# Patient Record
Sex: Female | Born: 1955
Health system: Southern US, Community
[De-identification: ages and names within clinical notes are randomized; demographics above are authoritative.]

## PROBLEM LIST (undated history)

## (undated) DIAGNOSIS — Z0000 Encounter for general adult medical examination without abnormal findings: Secondary | ICD-10-CM

## (undated) DIAGNOSIS — Z13220 Encounter for screening for lipoid disorders: Secondary | ICD-10-CM

## (undated) DIAGNOSIS — Z87891 Personal history of nicotine dependence: Secondary | ICD-10-CM

## (undated) HISTORY — DX: Encounter for general adult medical examination without abnormal findings: Z00.00

## (undated) HISTORY — DX: Personal history of nicotine dependence: Z87.891

## (undated) HISTORY — DX: Encounter for screening for lipoid disorders: Z13.220

## (undated) HISTORY — PX: OTHER SURGICAL HISTORY: SHX169

---

## 1997-06-09 ENCOUNTER — Other Ambulatory Visit: Admission: RE | Admit: 1997-06-09 | Discharge: 1997-06-09 | Payer: Self-pay | Admitting: Obstetrics and Gynecology

## 1999-02-18 ENCOUNTER — Other Ambulatory Visit: Admission: RE | Admit: 1999-02-18 | Discharge: 1999-02-18 | Payer: Self-pay | Admitting: Obstetrics and Gynecology

## 2000-04-10 ENCOUNTER — Other Ambulatory Visit: Admission: RE | Admit: 2000-04-10 | Discharge: 2000-04-10 | Payer: Self-pay | Admitting: Obstetrics and Gynecology

## 2001-11-05 ENCOUNTER — Other Ambulatory Visit: Admission: RE | Admit: 2001-11-05 | Discharge: 2001-11-05 | Payer: Self-pay | Admitting: Obstetrics and Gynecology

## 2001-12-11 ENCOUNTER — Encounter: Admission: RE | Admit: 2001-12-11 | Discharge: 2001-12-11 | Payer: Self-pay | Admitting: Family Medicine

## 2001-12-11 ENCOUNTER — Encounter: Payer: Self-pay | Admitting: Family Medicine

## 2002-01-14 ENCOUNTER — Encounter: Payer: Self-pay | Admitting: Obstetrics and Gynecology

## 2002-01-14 ENCOUNTER — Encounter: Admission: RE | Admit: 2002-01-14 | Discharge: 2002-01-14 | Payer: Self-pay | Admitting: Obstetrics and Gynecology

## 2004-06-25 ENCOUNTER — Encounter: Admission: RE | Admit: 2004-06-25 | Discharge: 2004-06-25 | Payer: Self-pay | Admitting: Obstetrics and Gynecology

## 2004-08-16 ENCOUNTER — Encounter: Admission: RE | Admit: 2004-08-16 | Discharge: 2004-08-16 | Payer: Self-pay | Admitting: Family Medicine

## 2004-10-06 ENCOUNTER — Ambulatory Visit: Payer: Self-pay

## 2008-01-09 ENCOUNTER — Encounter: Admission: RE | Admit: 2008-01-09 | Discharge: 2008-01-09 | Payer: Self-pay | Admitting: Obstetrics and Gynecology

## 2010-01-05 ENCOUNTER — Encounter: Admission: RE | Admit: 2010-01-05 | Discharge: 2010-01-05 | Payer: Self-pay | Admitting: Family Medicine

## 2010-02-27 ENCOUNTER — Encounter: Payer: Self-pay | Admitting: Obstetrics and Gynecology

## 2011-05-11 ENCOUNTER — Other Ambulatory Visit: Payer: Self-pay | Admitting: Obstetrics & Gynecology

## 2011-05-11 DIAGNOSIS — Z1231 Encounter for screening mammogram for malignant neoplasm of breast: Secondary | ICD-10-CM

## 2011-05-18 ENCOUNTER — Ambulatory Visit
Admission: RE | Admit: 2011-05-18 | Discharge: 2011-05-18 | Disposition: A | Discharge status: Home / Self Care | Payer: BC Managed Care – PPO | Source: Ambulatory Visit | Attending: Obstetrics & Gynecology | Admitting: Obstetrics & Gynecology

## 2011-05-18 DIAGNOSIS — Z1231 Encounter for screening mammogram for malignant neoplasm of breast: Secondary | ICD-10-CM

## 2012-11-21 ENCOUNTER — Other Ambulatory Visit: Payer: Self-pay

## 2012-11-21 DIAGNOSIS — Z1231 Encounter for screening mammogram for malignant neoplasm of breast: Secondary | ICD-10-CM

## 2012-11-23 ENCOUNTER — Ambulatory Visit: Payer: BC Managed Care – PPO

## 2013-01-08 ENCOUNTER — Ambulatory Visit
Admission: RE | Admit: 2013-01-08 | Discharge: 2013-01-08 | Disposition: A | Discharge status: Home / Self Care | Payer: BC Managed Care – PPO | Source: Ambulatory Visit

## 2013-01-08 DIAGNOSIS — Z1231 Encounter for screening mammogram for malignant neoplasm of breast: Secondary | ICD-10-CM

## 2013-06-28 ENCOUNTER — Other Ambulatory Visit: Payer: Self-pay | Admitting: Family Medicine

## 2013-06-28 DIAGNOSIS — Z87891 Personal history of nicotine dependence: Secondary | ICD-10-CM

## 2013-07-04 ENCOUNTER — Other Ambulatory Visit: Payer: BC Managed Care – PPO

## 2013-07-18 ENCOUNTER — Other Ambulatory Visit: Payer: BC Managed Care – PPO

## 2013-08-20 ENCOUNTER — Inpatient Hospital Stay: Admission: RE | Admit: 2013-08-20 | Payer: BC Managed Care – PPO | Source: Ambulatory Visit

## 2014-01-16 DIAGNOSIS — S73199A Other sprain of unspecified hip, initial encounter: Secondary | ICD-10-CM | POA: Insufficient documentation

## 2014-01-16 DIAGNOSIS — M169 Osteoarthritis of hip, unspecified: Secondary | ICD-10-CM | POA: Insufficient documentation

## 2014-11-25 ENCOUNTER — Other Ambulatory Visit (INDEPENDENT_AMBULATORY_CARE_PROVIDER_SITE_OTHER): Payer: BLUE CROSS/BLUE SHIELD

## 2014-11-25 ENCOUNTER — Encounter: Payer: Self-pay | Admitting: Family Medicine

## 2014-11-25 ENCOUNTER — Ambulatory Visit (INDEPENDENT_AMBULATORY_CARE_PROVIDER_SITE_OTHER): Payer: BLUE CROSS/BLUE SHIELD | Admitting: Family Medicine

## 2014-11-25 VITALS — BP 120/82 | HR 72 | Ht 67.0 in | Wt 179.0 lb

## 2014-11-25 DIAGNOSIS — M1612 Unilateral primary osteoarthritis, left hip: Secondary | ICD-10-CM | POA: Diagnosis not present

## 2014-11-25 DIAGNOSIS — M76891 Other specified enthesopathies of right lower limb, excluding foot: Secondary | ICD-10-CM | POA: Insufficient documentation

## 2014-11-25 DIAGNOSIS — M79605 Pain in left leg: Secondary | ICD-10-CM

## 2014-11-25 DIAGNOSIS — M7602 Gluteal tendinitis, left hip: Secondary | ICD-10-CM | POA: Diagnosis not present

## 2014-11-25 DIAGNOSIS — M76899 Other specified enthesopathies of unspecified lower limb, excluding foot: Secondary | ICD-10-CM | POA: Insufficient documentation

## 2014-11-25 DIAGNOSIS — M7062 Trochanteric bursitis, left hip: Secondary | ICD-10-CM | POA: Insufficient documentation

## 2014-11-25 NOTE — Assessment & Plan Note (Signed)
I do believe the patient's groin pain is likely secondary to more of the mild osteophytic changes. Patient does have some limitation with internal range of motion. We discussed the possibility of possibly imaging the patient declined at this time. Most of her pain seemed to be in the lateral aspect and she did respond very well to a gluteal tendon sheath injection and a greater trochanteric bursitis injection. Over this will be beneficial. Patient continues to have pain advance imaging to rule out labral tear may also be necessary. Patient may also respond well to a intra-articular hip injection. We'll discuss this at follow-up.

## 2014-11-25 NOTE — Patient Instructions (Signed)
Good to meet you Ice 20 minutes 2 times daily. Usually after activity and before bed. Exercises 3 times a week.  pennsaid pinkie amount topically 2 times daily as needed.  Turmeric 500mg  twice daily Tart cherry extract at night Vitamin D 2000 IU daily Wear good shoes  Ok to workout but swim (free style) elliptical or bike with avoiding running jumping and deep squats.  Compression shorts can help See me again before you leave!

## 2014-11-25 NOTE — Assessment & Plan Note (Signed)
Patient given injection today and tolerated the procedure very well. We discussed icing regimen and home exercises. We discussed which activities to do an which was potentially avoid. Patient come back and see me again in 3 weeks. Continued have pain on the lateral aspect we may need to consider lumbar radiculopathy but I think this is a low likelihood.

## 2014-11-25 NOTE — Progress Notes (Signed)
Samantha Cox D.O. [facility] [address] [place], [place] [address] Phone: [phone] Subjective:    I'm seeing this patient by the request  of:  No primary care provider on file.   CC: chronic hip and back pain.  VHQ:IONGEXBMWUHPI:Subjective Samantha Cox is a 59 y.o. female coming in with complaint of chronic hip and back pain. Patient has had this for quite some time. Back in December 2015 patient was seen by orthopedics within [facility]. Patient was diagnosed with more of a piriformis syndrome. Patient was seen by physical therapy. Patient states pain seems to be more on the lateral aspect of the hip. Patient does not remember any true injury. Seems to radiate posteriorly as well as somewhat down the leg. Patient does train horses on a regular basis and thinks that this can exacerbate the problem. Patient though has had a recent injury when she was swimming and now is having groin pain. Patient denies any radiation down her leg. Patient states though that this is starting to affect even her daily activities. Patient does have a very large competition in 3 weeks and at this moment she cannot ride horses secondary to the discomfort.Rates the severity of pain as 9 out of 10 sometimes.  Past Medical History  Diagnosis Date  . Routine general medical examination at a health care facility   . History of tobacco use   . Screening for lipoid disorders    Past Surgical History  Procedure Laterality Date  . Other surgical history      Birthmark removed from forehead 7th grade   Social History  Substance Use Topics  . Smoking status: Former Games developermoker  . Smokeless tobacco: None  . Alcohol Use: None   Allergies  Allergen Reactions  . Augmentin [Amoxicillin-Pot Clavulanate] Diarrhea    Felt very bad Felt very bad    Family History  Problem Relation Age of Onset  . Hypertension Mother   . Atrial fibrillation Father   . CVA Father [id number]  . Bladder Cancer Father   . Lung  cancer Brother   . Diverticulitis Brother         Past medical history, social, surgical and family history all reviewed in electronic medical record.   Review of Systems: No headache, visual changes, nausea, vomiting, diarrhea, constipation, dizziness, abdominal pain, skin rash, fevers, chills, night sweats, weight loss, swollen lymph nodes, body aches, joint swelling, muscle aches, chest pain, shortness of breath, mood changes.   Objective Blood pressure 120/82, pulse 72, height 5\' 7"  (1.702 m), weight 179 lb (81.194 kg), SpO2 95 %.  General: No apparent distress alert and oriented x3 mood and affect normal, dressed appropriately.  HEENT: Pupils equal, extraocular movements intact  Respiratory: Patient's speak in full sentences and does not appear short of breath  Cardiovascular: No lower extremity edema, non tender, no erythema  Skin: Warm dry intact with no signs of infection or rash on extremities or on axial skeleton.  Abdomen: Soft nontender  Neuro: Cranial nerves II through XII are intact, neurovascularly intact in all extremities with 2+ DTRs and 2+ pulses.  Lymph: No lymphadenopathy of posterior or anterior cervical chain or axillae bilaterally.  Gait normal with good balance and coordination.  MSK:  Non tender with full range of motion and good stability and symmetric strength and tone of shoulders, elbows, wrist,  knee and ankles bilaterally.  Back Exam:  Inspection: Unremarkable  Motion: Flexion 45 deg, Extension 25 deg, Side Bending  to 45 deg bilaterally,  Rotation to 45 deg bilaterally  SLR laying: Negative  XSLR laying: Negative  Palpable tenderness: None. FABER: mild positive Sensory change: Gross sensation intact to all lumbar and sacral dermatomes.  Reflexes: 2+ at both patellar tendons, 2+ at achilles tendons, Babinski's downgoing.  Strength at foot  Plantar-flexion: 5/5 Dorsi-flexion: 5/5 Eversion: 5/5 Inversion: 5/5  Leg strength  Quad: 5/5 Hamstring: 5/5 Hip  flexor: 5/5 Hip abductors: 5/5  Gait unremarkable.   ZOX:WRUE ROM IR: 15 Degwith pain in groin area, ER: 45 Deg, Flexion: 120 Deg, Extension: 100 Deg, Abduction: 45 Deg, Adduction: 45 Deg Strength IR: 5/5, ER: 5/5, Flexion: 5/5, Extension: 5/5, Abduction: 4/5, Adduction: 5/5 Pelvic alignment unremarkable to inspection and palpation. Standing hip rotation and gait without trendelenburg sign / unsteadiness. Greater trochanter without tenderness to palpation. No tenderness over piriformis and greater trochanter. No pain with FABER or FADIR. No SI joint tenderness and normal minimal SI movement.  MSK US performed of: Right This study was ordered, performed, and interpreted by Terrilee Files D.O.  Hip: Trochanteric bursa with significant hypoechoic changes and swelling she also has some effusion noted of the gluteal tendon. Acetabular labrum visualized and without tears, displacement, or effusion in joint. Femoral neck appears unremarkable without increased power doppler signal along Cortex.  IMPRESSION:  Greater trochanter bursitis moderate gluteal tendinitis   Procedure: Real-time Ultrasound Guided Injection of leftgreater trochanteric bursitis secondary to patient's body habitus Device: GE Logiq E  Ultrasound guided injection is preferred based studies that show increased duration, increased effect, greater accuracy, decreased procedural pain, increased response rate, and decreased cost with ultrasound guided versus blind injection.  Verbal informed consent obtained.  Time-out conducted.  Noted no overlying erythema, induration, or other signs of local infection.  Skin prepped in a sterile fashion.  Local anesthesia: Topical Ethyl chloride.  With sterile technique and under real time ultrasound guidance:  Greater trochanteric area was visualized and patient's bursa was noted. A 22-gauge 3 inch needle was inserted and 4 cc of 0.5% Marcaine and 1 cc of Kenalog 40 mg/dL was injected.  Pictures taken Completed without difficulty  Pain immediately resolved suggesting accurate placement of the medication.  Advised to call if fevers/chills, erythema, induration, drainage, or persistent bleeding.  Images permanently stored and available for review in the ultrasound unit.  Impression: Technically successful ultrasound guided injection.   Procedure note 97110; 15 minutes spent for Therapeutic exercises as stated in above notes.  This included exercises focusing on stretching, strengthening, with significant focus on eccentric aspects.Hip strengthening exercises which included:  Pelvic tilt/bracing to help with proper recruitment of the lower abs and pelvic floor muscles  Glute strengthening to properly contract glutes without over-engaging low back and hamstrings - prone hip extension and glute bridge exercises Proper stretching techniques to increase effectiveness for the hip flexors, groin, quads, piriformis and low back when appropriate    Proper technique shown and discussed handout in great detail with ATC.  All questions were discussed and answered.     Impression and Recommendations:     This case required medical decision making of moderate complexity.

## 2014-11-25 NOTE — Progress Notes (Signed)
Pre visit review using our clinic review tool, if applicable. No additional management support is needed unless otherwise documented below in the visit note. 

## 2014-12-04 ENCOUNTER — Ambulatory Visit (INDEPENDENT_AMBULATORY_CARE_PROVIDER_SITE_OTHER)
Admission: RE | Admit: 2014-12-04 | Discharge: 2014-12-04 | Disposition: A | Discharge status: Home / Self Care | Payer: BLUE CROSS/BLUE SHIELD | Source: Ambulatory Visit | Attending: Family Medicine | Admitting: Family Medicine

## 2014-12-04 ENCOUNTER — Encounter: Payer: Self-pay | Admitting: Family Medicine

## 2014-12-04 ENCOUNTER — Ambulatory Visit (INDEPENDENT_AMBULATORY_CARE_PROVIDER_SITE_OTHER): Payer: BLUE CROSS/BLUE SHIELD | Admitting: Family Medicine

## 2014-12-04 ENCOUNTER — Other Ambulatory Visit (INDEPENDENT_AMBULATORY_CARE_PROVIDER_SITE_OTHER): Payer: BLUE CROSS/BLUE SHIELD

## 2014-12-04 VITALS — BP 130/70 | HR 97 | Ht 67.0 in | Wt 176.0 lb

## 2014-12-04 DIAGNOSIS — M25552 Pain in left hip: Secondary | ICD-10-CM | POA: Diagnosis not present

## 2014-12-04 DIAGNOSIS — S73192A Other sprain of left hip, initial encounter: Secondary | ICD-10-CM | POA: Diagnosis not present

## 2014-12-04 NOTE — Assessment & Plan Note (Signed)
Patient was given an injection today. I do feel that patient likely has a labral tear. There is a possibility for more of a degenerative hip arthritis as well. . We will attempt to try to have her get the x-ray though. This could be more information. Patient did respond fairly well to the injection. We will continue to do so. Has not patient would need to advance imaging including an MRI. Patient does give a history of having a labral tear greater than 2 years ago. Patient come back and see me again in 4-6 weeks for further evaluation and treatment while she continues to conservative therapy.

## 2014-12-04 NOTE — Progress Notes (Signed)
[name] D.O. [facility] [address] [place], [place] [address] Phone: [phone] Subjective:    I'm seeing this patient by the request  of:  No primary care provider on file.   CC: chronic hip and back pain.  BJY:NWGNFAOZHYHPI:Subjective Samantha Cox is a 59 y.o. female coming in with complaint of chronic hip and back pain. Patient was seen previously and was diagnosed with more of a greater trochanteric bursitis as well as a gluteal tendinitis. Patient was given an injection in both of these. Patient was to do home exercises, icing protocol, and we discussed proper shoewear been given topical anti-inflammatories. Patient states the posterior aspect of the pain as well as the lateral aspect the pain is significantly improved. Continuing to have pain more in the groin area. [place] 3 [place] and would like to be near pain-free.  Past Medical History  Diagnosis Date  . Routine general medical examination at a health care facility   . History of tobacco use   . Screening for lipoid disorders    Past Surgical History  Procedure Laterality Date  . Other surgical history      Birthmark removed from forehead 7th grade   Social History  Substance Use Topics  . Smoking status: Former Games developermoker  . Smokeless tobacco: None  . Alcohol Use: None   Allergies  Allergen Reactions  . Augmentin [Amoxicillin-Pot Clavulanate] Diarrhea    Felt very bad Felt very bad    Family History  Problem Relation Age of Onset  . Hypertension Mother   . Atrial fibrillation Father   . CVA Father [phone]  . Bladder Cancer Father   . Lung cancer Brother   . Diverticulitis Brother         Past medical history, social, surgical and family history all reviewed in electronic medical record.   Review of Systems: No headache, visual changes, nausea, vomiting, diarrhea, constipation, dizziness, abdominal pain, skin rash, fevers, chills, night sweats, weight  loss, swollen lymph nodes, body aches, joint swelling, muscle aches, chest pain, shortness of breath, mood changes.   Objective Blood pressure 130/70, pulse 97, height 5\' 7"  (1.702 m), weight 176 lb (79.833 kg), SpO2 94 %.  General: No apparent distress alert and oriented x3 mood and affect normal, dressed appropriately.  HEENT: Pupils equal, extraocular movements intact  Respiratory: Patient's speak in full sentences and does not appear short of breath  Cardiovascular: No lower extremity edema, non tender, no erythema  Skin: Warm dry intact with no signs of infection or rash on extremities or on axial skeleton.  Abdomen: Soft nontender  Neuro: Cranial nerves II through XII are intact, neurovascularly intact in all extremities with 2+ DTRs and 2+ pulses.  Lymph: No lymphadenopathy of posterior or anterior cervical chain or axillae bilaterally.  Gait normal with good balance and coordination.  MSK:  Non tender with full range of motion and good stability and symmetric strength and tone of shoulders, elbows, wrist,  knee and ankles bilaterally.  Back Exam:  Inspection: Unremarkable  Motion: Flexion 45 deg, Extension 25 deg, Side Bending to 45 deg bilaterally,  Rotation to 45 deg bilaterally  SLR laying: Negative  XSLR laying: Negative  Palpable tenderness: None. FABER: mild positive bilaterally but improved from previous exam Sensory change: Gross sensation intact to all lumbar and sacral dermatomes.  Reflexes: 2+ at both patellar tendons, 2+ at achilles tendons, Babinski's downgoing.  Strength at foot  Plantar-flexion: 5/5  Dorsi-flexion: 5/5 Eversion: 5/5 Inversion: 5/5  Leg strength  Quad: 5/5 Hamstring: 5/5 Hip flexor: 5/5 Hip abductors: 5/5  Gait unremarkable.   ZOX:WRUE ROM IR: 15 Degwith pain in groin area, ER: 45 Deg, Flexion: 120 Deg, Extension: 100 Deg, Abduction: 45 Deg, Adduction: 45 Deg Strength IR: 5/5, ER: 5/5, Flexion: 5/5, Extension: 5/5, Abduction: 4/5, Adduction:  5/5 Pelvic alignment unremarkable to inspection and palpation. Standing hip rotation and gait without trendelenburg sign / unsteadiness. Greater trochanter without tenderness to palpation. Minimal tenderness over the medial tendon Increasing pain with internal rotation No SI joint tenderness and normal minimal SI movement.  Procedure: Real-time Ultrasound Guided Injection of right hip Device: GE Logiq E  Ultrasound guided injection is preferred based studies that show increased duration, increased effect, greater accuracy, decreased procedural pain, increased response rate with ultrasound guided versus blind injection.  Verbal informed consent obtained.  Time-out conducted.  Noted no overlying erythema, induration, or other signs of local infection.  Skin prepped in a sterile fashion.  Local anesthesia: Topical Ethyl chloride.  With sterile technique and under real time ultrasound guidance:  Anterior capsule visualized, needle visualized going to the head neck junction at the anterior capsule. Pictures taken. Patient did have injection of 3 cc of 1% lidocaine, 3 cc of 0.5% Marcaine, and 1 cc of Kenalog 40 mg/dL. Completed without difficulty  Pain immediately resolved suggesting accurate placement of the medication.  Advised to call if fevers/chills, erythema, induration, drainage, or persistent bleeding.  Images permanently stored and available for review in the ultrasound unit.  Impression: Technically successful ultrasound guided injection.      Impression and Recommendations:     This case required medical decision making of moderate complexity.

## 2014-12-04 NOTE — Patient Instructions (Signed)
Good to see you I will get an xray today Continue all that you are doing.  Tylenol 650 mg 3 times daily Advil as you needed Continue the vitamins See me again in 4-6 weeks.

## 2014-12-04 NOTE — Progress Notes (Signed)
Pre visit review using our clinic review tool, if applicable. No additional management support is needed unless otherwise documented below in the visit note. 

## 2015-01-19 ENCOUNTER — Ambulatory Visit: Payer: BLUE CROSS/BLUE SHIELD | Admitting: Family Medicine

## 2015-01-20 ENCOUNTER — Ambulatory Visit (INDEPENDENT_AMBULATORY_CARE_PROVIDER_SITE_OTHER): Payer: BLUE CROSS/BLUE SHIELD | Admitting: Family Medicine

## 2015-01-20 ENCOUNTER — Encounter: Payer: Self-pay | Admitting: Family Medicine

## 2015-01-20 VITALS — BP 124/84 | HR 80 | Ht 67.0 in | Wt 175.0 lb

## 2015-01-20 DIAGNOSIS — S73192D Other sprain of left hip, subsequent encounter: Secondary | ICD-10-CM

## 2015-01-20 DIAGNOSIS — M1612 Unilateral primary osteoarthritis, left hip: Secondary | ICD-10-CM

## 2015-01-20 NOTE — Progress Notes (Signed)
Pre visit review using our clinic review tool, if applicable. No additional management support is needed unless otherwise documented below in the visit note. 

## 2015-01-20 NOTE — Progress Notes (Signed)
[name] D.O. [facility] [address] [place], [place] [address] Phone: [phone] Subjective:    I'm seeing this patient by the request  of:  No primary care provider on file.   CC: chronic hip and back pain.  NFA:OZHYQMVHQIHPI:Subjective Samantha Cox is a 59 y.o. female coming in with complaint of chronic hip and back pain. Patient was seen previously and was diagnosed with more of a greater trochanteric bursitis as well as a gluteal tendinitis. Patient was given an injection in both of these. Patient was to do home exercises, icing protocol, and we discussed proper shoewear been given topical anti-inflammatories. Patient then had signs and symptoms are consistent with more of a labral tear of the hip that was seen on ultrasound. There is also question of some oral acetabular impingement syndrome. Patient elected to try an injection. States that this groin pain is 89% better. Patient states only some mild discomfort but nothing that is stopping her from activities. Patient states that this of her hip seems to be doing relatively well as well. Patient denies any giving out on her. Very happy with the results at this time.  Past Medical History  Diagnosis Date  . Routine general medical examination at a health care facility   . History of tobacco use   . Screening for lipoid disorders    Past Surgical History  Procedure Laterality Date  . Other surgical history      Birthmark removed from forehead 7th grade   Social History  Substance Use Topics  . Smoking status: Former Games developermoker  . Smokeless tobacco: None  . Alcohol Use: None   Allergies  Allergen Reactions  . Augmentin [Amoxicillin-Pot Clavulanate] Diarrhea    Felt very bad Felt very bad    Family History  Problem Relation Age of Onset  . Hypertension Mother   . Atrial fibrillation Father   . CVA Father [id number]  . Bladder Cancer Father   . Lung cancer Brother   . Diverticulitis Brother         Past medical  history, social, surgical and family history all reviewed in electronic medical record.   Review of Systems: No headache, visual changes, nausea, vomiting, diarrhea, constipation, dizziness, abdominal pain, skin rash, fevers, chills, night sweats, weight loss, swollen lymph nodes, body aches, joint swelling, muscle aches, chest pain, shortness of breath, mood changes.   Objective Blood pressure 124/84, pulse 80, height 5\' 7"  (1.702 m), weight 175 lb (79.379 kg), SpO2 97 %.  General: No apparent distress alert and oriented x3 mood and affect normal, dressed appropriately.  HEENT: Pupils equal, extraocular movements intact  Respiratory: Patient's speak in full sentences and does not appear short of breath  Cardiovascular: No lower extremity edema, non tender, no erythema  Skin: Warm dry intact with no signs of infection or rash on extremities or on axial skeleton.  Abdomen: Soft nontender  Neuro: Cranial nerves II through XII are intact, neurovascularly intact in all extremities with 2+ DTRs and 2+ pulses.  Lymph: No lymphadenopathy of posterior or anterior cervical chain or axillae bilaterally.  Gait normal with good balance and coordination.  MSK:  Non tender with full range of motion and good stability and symmetric strength and tone of shoulders, elbows, wrist,  knee and ankles bilaterally.  Back Exam:  Inspection: Unremarkable  Motion: Flexion 45 deg, Extension 25 deg, Side Bending to 45 deg bilaterally,  Rotation to 45 deg bilaterally  SLR laying: Negative  XSLR laying: Negative  Palpable tenderness: None. FABER: Negative bilaterally which is an improvement Sensory change: Gross sensation intact to all lumbar and sacral dermatomes.  Reflexes: 2+ at both patellar tendons, 2+ at achilles tendons, Babinski's downgoing.  Strength at foot  Plantar-flexion: 5/5 Dorsi-flexion: 5/5 Eversion: 5/5 Inversion: 5/5  Leg strength  Quad: 5/5 Hamstring: 5/5 Hip flexor: 5/5 Hip abductors: 5/5  Gait  unremarkable.   ZOX:WRUE ROM IR: 20 with no pain  ER: 45 Deg, Flexion: 120 Deg, Extension: 100 Deg, Abduction: 45 Deg, Adduction: 45 Deg Strength IR: 5/5, ER: 5/5, Flexion: 5/5, Extension: 5/5, Abduction: 4/5, Adduction: 5/5 Pelvic alignment unremarkable to inspection and palpation. Standing hip rotation and gait without trendelenburg sign / unsteadiness. Greater trochanter without tenderness to palpation. Minimal tenderness over the medial tendon Increasing pain with internal rotation No SI joint tenderness and normal minimal SI movement.   Impression and Recommendations:     This case required medical decision making of moderate complexity.

## 2015-01-20 NOTE — Patient Instructions (Signed)
Great to see you doing so well.  Ice if you need it.  Do what ever you want For the cramping try ferrous gluconate (iron) 65mg  daily with 500mg  of vitamin C Still recommend vitamin D 2000 IU daily See me again in 6 weeks if not perfect or cramping is continuing.  Happy holidays!

## 2015-01-20 NOTE — Assessment & Plan Note (Signed)
We'll continue to monitor 

## 2015-01-20 NOTE — Assessment & Plan Note (Signed)
Doing much better after injection. Encourage patient to stay active. We discussed that patient has any worsening symptoms do come back again. We can repeat intra-articular injection every 3-4 months if necessary. I do believe though that if it does seem to start increasing again I would consider advanced imaging with an MR arthrogram for further evaluation and see if surgical intervention is necessary. Patient was responding well to conservative therapy.

## 2015-03-03 ENCOUNTER — Ambulatory Visit: Payer: BLUE CROSS/BLUE SHIELD | Admitting: Family Medicine

## 2015-05-13 ENCOUNTER — Encounter: Payer: Self-pay | Admitting: Family Medicine

## 2015-05-13 ENCOUNTER — Ambulatory Visit (INDEPENDENT_AMBULATORY_CARE_PROVIDER_SITE_OTHER): Payer: BLUE CROSS/BLUE SHIELD | Admitting: Family Medicine

## 2015-05-13 VITALS — BP 118/76 | HR 74 | Wt 179.0 lb

## 2015-05-13 DIAGNOSIS — G5702 Lesion of sciatic nerve, left lower limb: Secondary | ICD-10-CM | POA: Diagnosis not present

## 2015-05-13 DIAGNOSIS — M1612 Unilateral primary osteoarthritis, left hip: Secondary | ICD-10-CM | POA: Diagnosis not present

## 2015-05-13 DIAGNOSIS — M7062 Trochanteric bursitis, left hip: Secondary | ICD-10-CM

## 2015-05-13 NOTE — Assessment & Plan Note (Signed)
Mild tenderness. If worsening symptoms again repeat this injection as well.

## 2015-05-13 NOTE — Assessment & Plan Note (Signed)
Mild overall. Symptoms do not correspond with this at this time. We'll continue to monitor.

## 2015-05-13 NOTE — Assessment & Plan Note (Signed)
Patient's has more of a piriformis syndrome as well as the gluteal tendinitis of seems to be giving her trouble. Patient will be sent to formal physical therapy. Patient given a trial of oral anti-inflammatories for 3 days. We discussed icing and manual massage. Patient will work on hip abductor strengthening. Patient and will come back and see me again in 4 weeks for further evaluation.

## 2015-05-13 NOTE — Progress Notes (Signed)
Samantha Cox [address] [place], [place] [address] Phone: [phone] Subjective:      CC: chronic hip and back pain f/u   BJY:[id number] Samantha Cox is a 60 y.o. female coming in with complaint of chronic hip and back pain. Patient was seen previously and there was concern for a labral pathology of the left hip as well as the greater trochanteric bursitis. Patient rated 6 months ago did respond well to the injections. Patient states though that now the pain seems to be more on the posterior aspect of the buttocks. Seems to be worse with sitting long amount of time. When waking up in the morning he can be very stiff. Has been going to yoga 4-5 times a week to try to stretch it which seems to be somewhat helpful. Been doing some icing that is also been helpful. Not taking any medications. States that the groin pain seems to be better since the injection is still having some mild lateral hip pain.   Past Medical History  Diagnosis Date  . Routine general medical examination at a health care facility   . History of tobacco use   . Screening for lipoid disorders    Past Surgical History  Procedure Laterality Date  . Other surgical history      Birthmark removed from forehead 7th grade   Social History  Substance Use Topics  . Smoking status: Former Games developer  . Smokeless tobacco: None  . Alcohol Use: None   Allergies  Allergen Reactions  . Augmentin [Amoxicillin-Pot Clavulanate] Diarrhea    Felt very bad Felt very bad    Family History  Problem Relation Age of Onset  . Hypertension Mother   . Atrial fibrillation Father   . CVA Father 57  . Bladder Cancer Father   . Lung cancer Brother   . Diverticulitis Brother         Past medical history, social, surgical and family history all reviewed in electronic medical record.   Review of Systems: No headache, visual changes, nausea, vomiting, diarrhea, constipation, dizziness, abdominal pain,  skin rash, fevers, chills, night sweats, weight loss, swollen lymph nodes, body aches, joint swelling, muscle aches, chest pain, shortness of breath, mood changes.   Objective Blood pressure 118/76, pulse 74, weight 179 lb (81.194 kg).  General: No apparent distress alert and oriented x3 mood and affect normal, dressed appropriately.  HEENT: Pupils equal, extraocular movements intact  Respiratory: Patient's speak in full sentences and does not appear short of breath  Cardiovascular: No lower extremity edema, non tender, no erythema  Skin: Warm dry intact with no signs of infection or rash on extremities or on axial skeleton.  Abdomen: Soft nontender  Neuro: Cranial nerves II through XII are intact, neurovascularly intact in all extremities with 2+ DTRs and 2+ pulses.  Lymph: No lymphadenopathy of posterior or anterior cervical chain or axillae bilaterally.  Gait normal with good balance and coordination.  MSK:  Non tender with full range of motion and good stability and symmetric strength and tone of shoulders, elbows, wrist,  knee and ankles bilaterally.  Back Exam:  Inspection: Unremarkable  Motion: Flexion 45 deg, Extension 25 deg, Side Bending to 45 deg bilaterally,  Rotation to 45 deg bilaterally  SLR laying: Negative  XSLR laying: Negative  Palpable tenderness: Minimal tenderness over the lateral aspect of the hip. FABER: Negative bilaterally which is an improvement Sensory change: Gross sensation intact to all lumbar and sacral dermatomes.  Reflexes: 2+ at both patellar tendons, 2+ at achilles tendons, Babinski's downgoing.  Strength at foot  Plantar-flexion: 5/5 Dorsi-flexion: 5/5 Eversion: 5/5 Inversion: 5/5  Leg strength  Quad: 5/5 Hamstring: 5/5 Hip flexor: 5/5 Hip abductors: 4/5  Gait unremarkable.   WNU:UVOZHip:left ROM IR: 20 with no pain  ER: 45 Deg, Flexion: 120 Deg, Extension: 100 Deg, Abduction: 45 Deg, Adduction: 45 Deg Strength IR: 5/5, ER: 5/5, Flexion: 5/5,  Extension: 5/5, Abduction: 4+/5, Adduction: 5/5 Pelvic alignment unremarkable to inspection and palpation. Standing hip rotation and gait without trendelenburg sign / unsteadiness. Greater trochanter without tenderness to palpation. More pain over the piriformis. Positive Faber test on the left side. Increasing pain with internal rotation No SI joint tenderness and normal minimal SI movement. Contralateral hip unremarkable.    Impression and Recommendations:     This case required medical decision making of moderate complexity.

## 2015-05-13 NOTE — Patient Instructions (Signed)
Good to see you  Ice 20 minutes 2 times daily. Usually after activity and before bed. Tennis ball in back left pocket when sitting.  Continue the turmeric Duexis 3 times a day for 3 days  Keep being active Exercises on wall.  Heel and butt touching.  Raise leg 6 inches and hold 2 seconds.  Down slow for count of 4 seconds.  1 set of 30 reps daily on both sides.  See me again in 4 weeks.

## 2015-05-28 ENCOUNTER — Ambulatory Visit: Payer: BLUE CROSS/BLUE SHIELD | Attending: Family Medicine | Admitting: Physical Therapy

## 2015-05-28 DIAGNOSIS — M25552 Pain in left hip: Secondary | ICD-10-CM | POA: Insufficient documentation

## 2015-05-28 DIAGNOSIS — R252 Cramp and spasm: Secondary | ICD-10-CM | POA: Diagnosis not present

## 2015-05-28 DIAGNOSIS — M6281 Muscle weakness (generalized): Secondary | ICD-10-CM | POA: Diagnosis not present

## 2015-05-28 DIAGNOSIS — R293 Abnormal posture: Secondary | ICD-10-CM | POA: Insufficient documentation

## 2015-05-28 NOTE — Therapy (Signed)
[facility] [address] [place], [place], [address] Phone: [phone]   Fax:  [phone]  Physical Therapy Evaluation  Patient Details  Name: Samantha Cox MRN: [id number] Date of Birth: 02/04/1956 Referring Provider: Judi SaaZachary M Smith MD  Encounter Date: 05/28/2015      PT End of Session - 05/28/15 1809    Visit Number 1   Number of Visits 13   Date for PT Re-Evaluation 07/09/15   Authorization Type BCBS   PT Start Time 1502   PT Stop Time 1546   PT Time Calculation (min) 44 min   Activity Tolerance Patient tolerated treatment well   Behavior During Therapy [facility] for tasks assessed/performed      Past Medical History  Diagnosis Date  . Routine general medical examination at a health care facility   . History of tobacco use   . Screening for lipoid disorders     Past Surgical History  Procedure Laterality Date  . Other surgical history      Birthmark removed from forehead 7th grade    There were no vitals filed for this visit.       Subjective Assessment - 05/28/15 1509    Subjective pt is a [age] y,o F with CC of L posterior hip pain that started over a year ago that started insidiously. She reports hx of having back pain from having a horse fall on her. she reports pain referral of pain down the L toes. She reports it worse the morning. Since onset a year ago she reports it has gotten better. She has recieved injections for the hip a while back.    Limitations Other (comment)  bending forward   How long can you sit comfortably? 10 min   How long can you stand comfortably? 10 min   How long can you walk comfortably? couple hours   Diagnostic tests x-ray 2 years ago   Patient Stated Goals to decrase pain in the glutes, resolve piriformis pain    Currently in Pain? Yes   Pain Score 3    Pain Location Hip   Pain Orientation Left;Posterior   Pain Descriptors / Indicators Dull;Aching   Pain Type Chronic pain    Pain Radiating Towards to the lateral hip, to the knee and toes on the left   Pain Onset More than a month ago   Pain Frequency Constant   Aggravating Factors  bending, getting up inthe morning, sitting depending on surface   Pain Relieving Factors stretching, advil, muscle relaxer, icing,             [facility] PT Assessment - 05/28/15 1506    Assessment   Medical Diagnosis L piriformis syndrom   Referring Provider Judi SaaZachary M Smith MD   Onset Date/Surgical Date --  1 year ago   Hand Dominance Right   Next MD Visit make on PRN   Prior Therapy yes   Precautions   Precautions None   Restrictions   Weight Bearing Restrictions No   Balance Screen   Has the patient fallen in the past 6 months Yes   How many times? 1   Has the patient had a decrease in activity level because of a fear of falling?  No   Is the patient reluctant to leave their home because of a fear of falling?  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, mediator and facilitator   Vocation Requirements lifting,carrying, sitting, pulling/ pushing, riding   Cognition   Overall Cognitive Status Within Functional Limits for tasks assessed   Observation/Other Assessments   Lower Extremity Functional Scale  69/80   Posture/Postural Control   Posture/Postural Control Postural limitations   Postural Limitations Rounded Shoulders;Forward head   ROM / Strength   AROM / PROM / Strength PROM;AROM;Strength   AROM   Overall AROM  Within functional limits for tasks performed   AROM  Assessment Site Hip;Knee;Lumbar   Right/Left Hip Right;Left   Right Hip Internal Rotation  26   Left Hip Internal Rotation  16   Right/Left Knee Right;Left   Lumbar Flexion 70   Lumbar Extension 10   Lumbar - Right Side Bend 20   Lumbar - Left Side Bend 20   Strength   Strength Assessment Site Hip;Knee   Right/Left Hip Right;Left   Right Hip Flexion 4+/5   Right Hip Extension 4-/5   Right Hip External Rotation  5/5   Right Hip Internal Rotation 5/5   Right Hip ABduction 4-/5   Right Hip ADduction 5/5   Left Hip Flexion 4/5   Left Hip Extension 4-/5   Left Hip External Rotation 5/5   Left Hip Internal Rotation 5/5   Left Hip ABduction 3+/5   Left Hip ADduction 5/5   Right/Left Knee --   Palpation   Palpation comment tenderness at the L glute max and piriformis with referral of pain down the L leg   Ambulation/Gait   Gait Pattern Step-through pattern;Trendelenburg;Antalgic;Decreased stride length                           PT Education - 05/28/15 1809    Education provided Yes   Education Details evaluation findings, POC, Goals, HEP,    Person(s) Educated Patient   Methods Explanation   Comprehension Verbalized understanding          PT Short Term Goals - 05/28/15 1818    PT SHORT TERM GOAL #1   Title pt will be I with inital HEP (06/18/2015)   Time 3   Period Weeks   Status New           PT Long Term Goals - 05/28/15 1819    PT LONG TERM GOAL #1   Title pt will be I with all HEP as of last visit (07/09/2015)   Time 6   Period Weeks   Status New   PT LONG TERM GOAL #2   Title pt will improve bil hip abductor/ extensor strength to >/= 4/5 with </= 2/10 pain to assist with ADLS (07/09/2015)   Time 6   Period Weeks   Status New   PT LONG TERM GOAL #3   Title pt will demonstrate decrease spasm in the L piriformis to decrease pain to </= 2/10 with no referral of pain down the L leg (07/09/2015)   Time 6   Period Weeks   Status New   PT LONG  TERM GOAL #4   Title pt will be able to so sit/ stand for >/=  20 minutes with </= 2/10 pain to assist with job related activities (07/09/2015)   Time 6   Period Weeks   Status New   PT LONG TERM GOAL #5   Title pt will improve her LEFS score by >/= 5 points to demonstate improvement in function at discharge (07/09/2015)   Time 6   Period Weeks   Status New               Plan - 05/28/15 1810    Clinical Impression Statement Samantha Cox presents to OPP[facility] as a Low complexity evaluation with CC of L posterior hip pain with intermittent referral down the L leg to the knee / toes that has been present for over a year ago. She demonstrates functional hip mobility in all planes with limitation of L internal rotation compared bil with pain. MMT revealed weakness of  bil hip extensors / abdcutors with L>R. Palpation was remarkable for tenderness over the piriformis and glute medius/maximus. evaluation findings consistent with dx of piriformis syndrome. Pt would benefit from phsyical therapy to improve hip strength, decrease hip pain, decrease spasm, and return pt to PLOF by addressing the impairments listed.    Rehab Potential Good   PT Frequency 2x / week   PT Duration 6 weeks   PT Treatment/Interventions ADLs/Self Care Home Management;Cryotherapy;Electrical Stimulation;Iontophoresis /ml Dexamethasone;Moist Heat;Therapeutic exercise;Manual techniques;Vasopneumatic Device;Taping;Dry needling;Passive range of motion;Patient/family education;Therapeutic activities;Ultrasound   PT Next Visit Plan assess/ review HEP, hip strengthening, Dry needling, manual, modalities PRN   PT Home Exercise Plan lower trunk rotation, standing hip extension/ abduction, piriformis stretching   Consulted and Agree with Plan of Care Patient      Patient will benefit from skilled therapeutic intervention in order to improve the following deficits and impairments:  Abnormal gait, Pain, Improper body mechanics, Postural  dysfunction, Impaired flexibility, Decreased strength, Increased muscle spasms, Decreased endurance, Impaired tone, Increased fascial restricitons, Decreased range of motion  Visit Diagnosis: Pain in left hip - Plan: PT plan of care cert/re-cert  Muscle weakness (generalized) - Plan: PT plan of care cert/re-cert  Cramp and spasm - Plan: PT plan of care cert/re-cert  Abnormal posture - Plan: PT plan of care cert/re-cert     Problem List Patient Active Problem List   Diagnosis Date Noted  . Piriformis syndrome of left side 05/13/2015  . Greater trochanteric bursitis of left hip 11/25/2014  . Gluteal tendinitis of left buttock 11/25/2014  . Degenerative arthritis of hip 01/16/2014  . Acetabular labrum tear 01/16/2014   Lulu Riding PT, DPT, LAT, ATC  05/28/2015  6:25 PM      [facility] [address] [place], [place], [address] Phone: [phone]   Fax:  [phone]  Name: Samantha Cox MRN: [id number] Date of Birth: Sep 19, 1955

## 2015-05-28 NOTE — Patient Instructions (Addendum)

## 2015-06-08 ENCOUNTER — Ambulatory Visit: Payer: BLUE CROSS/BLUE SHIELD | Admitting: Physical Therapy

## 2015-06-09 ENCOUNTER — Other Ambulatory Visit (HOSPITAL_COMMUNITY)
Admission: RE | Admit: 2015-06-09 | Discharge: 2015-06-09 | Disposition: A | Discharge status: Home / Self Care | Payer: BLUE CROSS/BLUE SHIELD | Source: Ambulatory Visit | Attending: Family Medicine | Admitting: Family Medicine

## 2015-06-09 ENCOUNTER — Other Ambulatory Visit: Payer: Self-pay | Admitting: Family Medicine

## 2015-06-09 DIAGNOSIS — Z13220 Encounter for screening for lipoid disorders: Secondary | ICD-10-CM | POA: Diagnosis not present

## 2015-06-09 DIAGNOSIS — Z124 Encounter for screening for malignant neoplasm of cervix: Secondary | ICD-10-CM | POA: Insufficient documentation

## 2015-06-09 DIAGNOSIS — Z0000 Encounter for general adult medical examination without abnormal findings: Secondary | ICD-10-CM | POA: Diagnosis not present

## 2015-06-09 DIAGNOSIS — Z1151 Encounter for screening for human papillomavirus (HPV): Secondary | ICD-10-CM | POA: Diagnosis not present

## 2015-06-10 LAB — CYTOLOGY - PAP

## 2015-06-11 ENCOUNTER — Ambulatory Visit: Payer: BLUE CROSS/BLUE SHIELD | Attending: Family Medicine | Admitting: Physical Therapy

## 2015-06-11 DIAGNOSIS — M6281 Muscle weakness (generalized): Secondary | ICD-10-CM | POA: Diagnosis not present

## 2015-06-11 DIAGNOSIS — M25552 Pain in left hip: Secondary | ICD-10-CM

## 2015-06-11 DIAGNOSIS — R252 Cramp and spasm: Secondary | ICD-10-CM | POA: Diagnosis not present

## 2015-06-11 DIAGNOSIS — R293 Abnormal posture: Secondary | ICD-10-CM | POA: Diagnosis not present

## 2015-06-11 NOTE — Patient Instructions (Addendum)

## 2015-06-11 NOTE — Therapy (Signed)
[facility] [address] [place], [place], [address] Phone: [phone]   Fax:  [phone]  Physical Therapy Treatment  Patient Details  Name: Samantha Cox B Strickling MRN: [id number] Date of Birth: 1955/02/17 Referring Provider: Judi SaaZachary M Smith MD  Encounter Date: 06/11/2015      PT End of Session - 06/11/15 1518    Visit Number 2   Number of Visits 13   Date for PT Re-Evaluation 07/09/15   PT Start Time 1506   PT Stop Time 1546   PT Time Calculation (min) 40 min   Activity Tolerance Patient tolerated treatment well   Behavior During Therapy [facility] for tasks assessed/performed      Past Medical History  Diagnosis Date  . Routine general medical examination at a health care facility   . History of tobacco use   . Screening for lipoid disorders     Past Surgical History  Procedure Laterality Date  . Other surgical history      Birthmark removed from forehead 7th grade    There were no vitals filed for this visit.      Subjective Assessment - 06/11/15 1512    Subjective "doing well, staying consistent with her HEP"   Currently in Pain? Yes   Pain Score 1    Pain Orientation Left;Posterior   Pain Descriptors / Indicators Sore   Pain Type Chronic pain   Pain Onset More than a month ago   Pain Frequency Constant   Aggravating Factors  sleeping,    Pain Relieving Factors streching, advil, muscle relaxer icing,                          [facility] Adult PT Treatment/Exercise - 06/11/15 1518    Self-Care   Self-Care Posture   Posture posture education with lifting and carrying mechanics handout   Lumbar Exercises: Stretches   Active Hamstring Stretch 4 reps;30 seconds  contract/ relax with 10 sec hold   Lower Trunk Rotation 3 reps;10 seconds   Piriformis Stretch 2 reps;30 seconds   Lumbar Exercises: Standing   Other Standing Lumbar Exercises standing hip abduction 2 x 10 bil, 2 standing hip extension 2 x  10 each.    Lumbar Exercises: Supine   Bridge 10 reps  2 x                PT Education - 06/11/15 1700    Education provided Yes   Education Details posture education, reviewed and updated HEP   Person(s) Educated Patient   Methods Explanation   Comprehension Verbalized understanding          PT Short Term Goals - 05/28/15 1818    PT SHORT TERM GOAL #1   Title pt will be I with inital HEP (06/18/2015)   Time 3   Period Weeks   Status New           PT Long Term Goals - 05/28/15 1819    PT LONG TERM GOAL #1   Title pt will be I with all HEP as of last visit (07/09/2015)   Time 6   Period Weeks   Status New   PT LONG TERM GOAL #2   Title pt will improve bil hip abductor/ extensor strength to >/= 4/5 with </= 2/10 pain to assist with ADLS (07/09/2015)   Time 6   Period Weeks   Status New   PT LONG TERM GOAL #3   Title  pt will demonstrate decrease spasm in the L piriformis to decrease pain to </= 2/10 with no referral of pain down the L leg (07/09/2015)   Time 6   Period Weeks   Status New   PT LONG TERM GOAL #4   Title pt will be able to so sit/ stand for >/= 20 minutes with </= 2/10 pain to assist with job related activities (07/09/2015)   Time 6   Period Weeks   Status New   PT LONG TERM GOAL #5   Title pt will improve her LEFS score by >/= 5 points to demonstate improvement in function at discharge (07/09/2015)   Time 6   Period Weeks   Status New               Plan - 06/11/15 1700    Clinical Impression Statement Mrs. Kozak reports consistency with her HEP and that it has been helping. Reviewed her HEP and educated about proper posture with lifting mechanics. Following todays session she reported 0/10 pain and declined modalites.    PT Next Visit Plan  hip strengthening in [facility] , Dry needling PRN , manual, modalities PRN, stretching of the hips, core strengthening.    PT Home Exercise Plan hamstring stretch, posture education   Consulted and Agree  with Plan of Care Patient      Patient will benefit from skilled therapeutic intervention in order to improve the following deficits and impairments:  Abnormal gait, Pain, Improper body mechanics, Postural dysfunction, Impaired flexibility, Decreased strength, Increased muscle spasms, Decreased endurance, Impaired tone, Increased fascial restricitons, Decreased range of motion  Visit Diagnosis: Pain in left hip  Muscle weakness (generalized)  Cramp and spasm  Abnormal posture     Problem List Patient Active Problem List   Diagnosis Date Noted  . Piriformis syndrome of left side 05/13/2015  . Greater trochanteric bursitis of left hip 11/25/2014  . Gluteal tendinitis of left buttock 11/25/2014  . Degenerative arthritis of hip 01/16/2014  . Acetabular labrum tear 01/16/2014   Lulu Riding PT, DPT, LAT, ATC  06/11/2015  5:05 PM      [facility] [address] [place], [place], [address] Phone: [phone]   Fax:  [phone]  Name: Samantha Cox MRN: [id number] Date of Birth: [birth year]

## 2015-06-16 ENCOUNTER — Ambulatory Visit: Payer: BLUE CROSS/BLUE SHIELD | Admitting: Physical Therapy

## 2015-06-16 DIAGNOSIS — R293 Abnormal posture: Secondary | ICD-10-CM

## 2015-06-16 DIAGNOSIS — M6281 Muscle weakness (generalized): Secondary | ICD-10-CM

## 2015-06-16 DIAGNOSIS — R252 Cramp and spasm: Secondary | ICD-10-CM | POA: Diagnosis not present

## 2015-06-16 DIAGNOSIS — M25552 Pain in left hip: Secondary | ICD-10-CM | POA: Diagnosis not present

## 2015-06-16 NOTE — Therapy (Signed)
[facility] [address] [place], [place], [address] Phone: [phone]   Fax:  [phone]  Physical Therapy Treatment  Patient Details  Name: Samantha Cox MRN: [id number] Date of Birth: [birth year] Referring Provider: Judi SaaZachary M Smith MD  Encounter Date: 06/16/2015      PT End of Session - 06/16/15 1720    Visit Number 3   Number of Visits 13   Date for PT Re-Evaluation 07/09/15   Authorization Type BCBS   PT Start Time 1646  pt arrived 16 minutes late    PT Stop Time 1725   PT Time Calculation (min) 39 min   Activity Tolerance Patient tolerated treatment well   Behavior During Therapy [facility] for tasks assessed/performed      Past Medical History  Diagnosis Date  . Routine general medical examination at a health care facility   . History of tobacco use   . Screening for lipoid disorders     Past Surgical History  Procedure Laterality Date  . Other surgical history      Birthmark removed from forehead 7th grade    There were no vitals filed for this visit.      Subjective Assessment - 06/16/15 1651    Subjective "felt some soreness the other day getting out of bed like it was the usual"   Currently in Pain? Yes   Pain Score 4    Pain Location Hip   Pain Orientation Left   Pain Descriptors / Indicators Sore   Pain Type Chronic pain   Pain Frequency Constant                         [facility] Adult PT Treatment/Exercise - 06/16/15 1717    Lumbar Exercises: Supine   Bridge 10 reps   Lumbar Exercises: Sidelying   Hip Abduction 15 reps;2 seconds   Manual Therapy   Manual Therapy Soft tissue mobilization   Soft tissue mobilization IASTM over the L glute Medius/ Minimus          Trigger Point Dry Needling - 06/16/15 1716    Consent Given? Yes   Education Handout Provided Yes   Muscles Treated Lower Body Gluteus minimus  glute Medius   Gluteus Minimus Response Twitch response  elicited;Palpable increased muscle length              PT Education - 06/16/15 1719    Education provided Yes   Education Details Dry needling education   Person(s) Educated Patient   Methods Explanation   Comprehension Verbalized understanding          PT Short Term Goals - 06/16/15 1727    PT SHORT TERM GOAL #1   Title pt will be I with inital HEP (06/18/2015)   Time 3   Period Weeks   Status On-going           PT Long Term Goals - 06/16/15 1727    PT LONG TERM GOAL #1   Title pt will be I with all HEP as of last visit (07/09/2015)   Time 6   Period Weeks   Status On-going   PT LONG TERM GOAL #2   Title pt will improve bil hip abductor/ extensor strength to >/= 4/5 with </= 2/10 pain to assist with ADLS (07/09/2015)   Time 6   Period Weeks   Status On-going   PT LONG TERM GOAL #3   Title pt will demonstrate decrease spasm  in the L piriformis to decrease pain to </= 2/10 with no referral of pain down the L leg (07/09/2015)   Time 6   Period Weeks   Status On-going   PT LONG TERM GOAL #4   Title pt will be able to so sit/ stand for >/= 20 minutes with </= 2/10 pain to assist with job related activities (07/09/2015)   Time 6   Period Weeks   Status On-going   PT LONG TERM GOAL #5   Title pt will improve her LEFS score by >/= 5 points to demonstate improvement in function at discharge (07/09/2015)   Time 6   Period Weeks   Status On-going               Plan - 06/16/15 1721    Clinical Impression Statement [name] reports increased soreness in the glute medius today. She provided consent for dry needling of the glute medius and was monitored during treatment. Performed IASTM following DN which she reported relief of tension, and was able to perform exercises with minimal soreness. Utilized MHP post session to decrease soreness from DN.    PT Next Visit Plan  hip strengthening in [facility] , Dry needling PRN , manual, modalities PRN, stretching of the hips,  core strengthening.    Consulted and Agree with Plan of Care Patient      Patient will benefit from skilled therapeutic intervention in order to improve the following deficits and impairments:  Abnormal gait, Pain, Improper body mechanics, Postural dysfunction, Impaired flexibility, Decreased strength, Increased muscle spasms, Decreased endurance, Impaired tone, Increased fascial restricitons, Decreased range of motion  Visit Diagnosis: Pain in left hip  Muscle weakness (generalized)  Cramp and spasm  Abnormal posture     Problem List Patient Active Problem List   Diagnosis Date Noted  . Piriformis syndrome of left side 05/13/2015  . Greater trochanteric bursitis of left hip 11/25/2014  . Gluteal tendinitis of left buttock 11/25/2014  . Degenerative arthritis of hip 01/16/2014  . Acetabular labrum tear 01/16/2014   Samantha Cox PT, DPT, LAT, ATC  06/16/2015  5:30 PM      [facility] [address] [place], [place], [address] Phone: [phone]   Fax:  [phone]  Name: Samantha Cox MRN: [id number] Date of Birth: May 23, 1955

## 2015-06-18 ENCOUNTER — Ambulatory Visit: Payer: BLUE CROSS/BLUE SHIELD | Admitting: Physical Therapy

## 2015-06-23 ENCOUNTER — Ambulatory Visit: Payer: BLUE CROSS/BLUE SHIELD | Admitting: Physical Therapy

## 2015-06-23 DIAGNOSIS — M25552 Pain in left hip: Secondary | ICD-10-CM | POA: Diagnosis not present

## 2015-06-23 DIAGNOSIS — R293 Abnormal posture: Secondary | ICD-10-CM | POA: Diagnosis not present

## 2015-06-23 DIAGNOSIS — M6281 Muscle weakness (generalized): Secondary | ICD-10-CM | POA: Diagnosis not present

## 2015-06-23 DIAGNOSIS — R252 Cramp and spasm: Secondary | ICD-10-CM

## 2015-06-23 NOTE — Therapy (Signed)
[facility] [address] [place], [place], [address] Phone: [phone]   Fax:  [phone]  Physical Therapy Treatment  Patient Details  Name: Samantha Cox MRN: [id number] Date of Birth: [birth year] Referring Provider: Judi SaaZachary M Smith MD  Encounter Date: 06/23/2015      PT End of Session - 06/23/15 1620    Visit Number 4   Number of Visits 13   Date for PT Re-Evaluation 07/09/15   Authorization Type BCBS   PT Start Time 1600  pt arrived 15 minutes late today   PT Stop Time 1631   PT Time Calculation (min) 31 min   Activity Tolerance Patient tolerated treatment well   Behavior During Therapy [facility] for tasks assessed/performed      Past Medical History  Diagnosis Date  . Routine general medical examination at a health care facility   . History of tobacco use   . Screening for lipoid disorders     Past Surgical History  Procedure Laterality Date  . Other surgical history      Birthmark removed from forehead 7th grade    There were no vitals filed for this visit.      Subjective Assessment - 06/23/15 1605    Subjective " I am feeling better today, but I don't know if it is from the DN or if it is from getting a DTM"   Currently in Pain? Yes   Pain Score 1    Pain Location Hip   Pain Orientation Left   Pain Descriptors / Indicators Sore   Pain Type Chronic pain   Pain Onset More than a month ago   Pain Frequency Constant   Aggravating Factors  sleeping, prolong walking,    Pain Relieving Factors stretching, DTM, DN                         [facility] Adult PT Treatment/Exercise - 06/23/15 1608    Lumbar Exercises: Stretches   Active Hamstring Stretch 30 seconds;3 reps  contract/ relax with 10 sec hold   Lumbar Exercises: Aerobic   Stationary Bike Nu-Step L 5 x 5 min   Lumbar Exercises: Standing   Other Standing Lumbar Exercises lateral band walks 4 x 10 steps  red theraband   Lumbar  Exercises: Seated   Sit to Stand 10 reps   Lumbar Exercises: Supine   Bridge 10 reps  2 x 10 reps with abduction with red theraband   Other Supine Lumbar Exercises hip IR 2 x 10 bil,  ER 2x 10 on LLE with red theraband only                PT Education - 06/23/15 1635    Education provided Yes   Education Details updated HEP   Person(s) Educated Patient   Methods Explanation   Comprehension Verbalized understanding          PT Short Term Goals - 06/23/15 1638    PT SHORT TERM GOAL #1   Title pt will be I with inital HEP (06/18/2015)   Time 3   Period Weeks   Status Achieved           PT Long Term Goals - 06/23/15 1638    PT LONG TERM GOAL #1   Title pt will be I with all HEP as of last visit (07/09/2015)   Time 6   Period Weeks   Status On-going   PT LONG TERM GOAL #  2   Title pt will improve bil hip abductor/ extensor strength to >/= 4/5 with </= 2/10 pain to assist with ADLS (07/09/2015)   Time 6   Period Weeks   Status On-going   PT LONG TERM GOAL #3   Title pt will demonstrate decrease spasm in the L piriformis to decrease pain to </= 2/10 with no referral of pain down the L leg (07/09/2015)   Time 6   Period Weeks   Status On-going   PT LONG TERM GOAL #4   Title pt will be able to so sit/ stand for >/= 20 minutes with </= 2/10 pain to assist with job related activities (07/09/2015)   Time 6   Period Weeks   Status On-going   PT LONG TERM GOAL #5   Title pt will improve her LEFS score by >/= 5 points to demonstate improvement in function at discharge (07/09/2015)   Time 6   Period Weeks   Status On-going               Plan - 06/23/15 1635    Clinical Impression Statement [name] was 15 minutes late today. she reported getting a Deep tissue massage sicne the last session so was difficult telling if the benefit was from the DN or from the DTM or both. Focsued only exercise today due to time limtations and pt feeling good. She was able to  perform all exercises with report of soreness but stated it felt good. she declined modalities today.    PT Next Visit Plan  hip strengthening in [facility] , Dry needling PRN , manual, modalities PRN, stretching of the hips, core strengthening.    PT Home Exercise Plan hip IR/ ER strenthening, lateral band walks.    Consulted and Agree with Plan of Care Patient      Patient will benefit from skilled therapeutic intervention in order to improve the following deficits and impairments:  Abnormal gait, Pain, Improper body mechanics, Postural dysfunction, Impaired flexibility, Decreased strength, Increased muscle spasms, Decreased endurance, Impaired tone, Increased fascial restricitons, Decreased range of motion  Visit Diagnosis: Pain in left hip  Muscle weakness (generalized)  Cramp and spasm  Abnormal posture     Problem List Patient Active Problem List   Diagnosis Date Noted  . Piriformis syndrome of left side 05/13/2015  . Greater trochanteric bursitis of left hip 11/25/2014  . Gluteal tendinitis of left buttock 11/25/2014  . Degenerative arthritis of hip 01/16/2014  . Acetabular labrum tear 01/16/2014   Lulu Riding PT, DPT, LAT, ATC  06/23/2015  4:39 PM      [facility] [address] [place], [place], [address] Phone: [phone]   Fax:  [phone]  Name: Samantha Cox MRN: [id number] Date of Birth: 1955-12-02

## 2015-06-25 ENCOUNTER — Other Ambulatory Visit: Payer: Self-pay | Admitting: Obstetrics & Gynecology

## 2015-06-25 ENCOUNTER — Encounter: Payer: BLUE CROSS/BLUE SHIELD | Admitting: Physical Therapy

## 2015-06-25 DIAGNOSIS — R87612 Low grade squamous intraepithelial lesion on cytologic smear of cervix (LGSIL): Secondary | ICD-10-CM | POA: Diagnosis not present

## 2015-06-25 DIAGNOSIS — A630 Anogenital (venereal) warts: Secondary | ICD-10-CM | POA: Diagnosis not present

## 2015-06-30 ENCOUNTER — Ambulatory Visit: Payer: BLUE CROSS/BLUE SHIELD | Admitting: Physical Therapy

## 2015-06-30 DIAGNOSIS — R252 Cramp and spasm: Secondary | ICD-10-CM

## 2015-06-30 DIAGNOSIS — M25552 Pain in left hip: Secondary | ICD-10-CM | POA: Diagnosis not present

## 2015-06-30 DIAGNOSIS — R293 Abnormal posture: Secondary | ICD-10-CM

## 2015-06-30 DIAGNOSIS — M6281 Muscle weakness (generalized): Secondary | ICD-10-CM

## 2015-06-30 NOTE — Therapy (Addendum)
[place], [place], [address] Phone: [phone]   Fax:  [phone]  Physical Therapy Treatment / Discharge Note  Patient Details  Name: VERDELL KINCANNON MRN: [id number] Date of Birth: 1955/03/08 Referring Provider: Lyndal Pulley MD  Encounter Date: 06/30/2015      PT End of Session - 06/30/15 1725    Visit Number 5   Number of Visits 13   Date for PT Re-Evaluation 07/09/15   Authorization Type BCBS   PT Start Time 1633   PT Stop Time 1718   PT Time Calculation (min) 45 min   Activity Tolerance Patient tolerated treatment well   Behavior During Therapy [facility] for tasks assessed/performed      Past Medical History  Diagnosis Date  . Routine general medical examination at a health care facility   . History of tobacco use   . Screening for lipoid disorders     Past Surgical History  Procedure Laterality Date  . Other surgical history      Birthmark removed from forehead 7th grade    There were no vitals filed for this visit.      Subjective Assessment - 06/30/15 1645    Subjective " I am feeling more sore today, but I think it could be due to from where I feel asleep on the couch"   Currently in Pain? Yes   Pain Score 5    Pain Location Hip   Pain Orientation Left   Pain Descriptors / Indicators Sore;Aching   Pain Type Chronic pain   Pain Onset More than a month ago   Pain Frequency Constant   Aggravating Factors  sleeping, prolonged walking/ standing   Pain Relieving Factors stretching, DTM, DN                         [facility] Adult PT Treatment/Exercise - 06/30/15 1647    Lumbar Exercises: Stretches   Active Hamstring Stretch 30 seconds;4 reps  contract/ relax with 10 sec contraction   Single Knee to Chest Stretch 3 reps;30 seconds   Piriformis Stretch 2 reps;30 seconds   Lumbar Exercises: Aerobic   Stationary Bike Nu-Step L 5 x 6 min   Lumbar Exercises: Supine    Bridge 10 reps  1 with 4 alternating kickouts per rep   Other Supine Lumbar Exercises alternating marching with ADIM while lying on bolster  cues to go slow for controlled motion   Other Supine Lumbar Exercises dead bug 4 x 10 sec  verbal/ tactile cues to keep hip/knees at 90/90   Lumbar Exercises: Sidelying   Other Sidelying Lumbar Exercises external rotatoin of L 2 x 15  3#                PT Education - 06/30/15 1725    Education provided Yes   Education Details updated HEP with edcuation on form and reps/ sets. educated on how eccentric control of exercises to promote increased strengthening   Person(s) Educated Patient   Methods Explanation   Comprehension Verbalized understanding          PT Short Term Goals - 06/23/15 1638    PT SHORT TERM GOAL #1   Title pt will be I with inital HEP (06/18/2015)   Time 3   Period Weeks   Status Achieved           PT Long Term Goals - 06/23/15 1638    PT LONG  TERM GOAL #1   Title pt will be I with all HEP as of last visit (07/09/2015)   Time 6   Period Weeks   Status On-going   PT LONG TERM GOAL #2   Title pt will improve bil hip abductor/ extensor strength to >/= 4/5 with </= 2/10 pain to assist with ADLS (06/12/2561)   Time 6   Period Weeks   Status On-going   PT LONG TERM GOAL #3   Title pt will demonstrate decrease spasm in the L piriformis to decrease pain to </= 2/10 with no referral of pain down the L leg (07/09/2015)   Time 6   Period Weeks   Status On-going   PT LONG TERM GOAL #4   Title pt will be able to so sit/ stand for >/= 20 minutes with </= 2/10 pain to assist with job related activities (07/09/2015)   Time 6   Period Weeks   Status On-going   PT LONG TERM GOAL #5   Title pt will improve her LEFS score by >/= 5 points to demonstate improvement in function at discharge (07/09/2015)   Time 6   Period Weeks   Status On-going               Plan - 06/30/15 1726    Clinical Impression Statement  Mrs. Mailhot states she is feeling more sore today but reports doing some boating requiring paddeling about 48 hours ago which may be a contrbuting factor. Following stretching and hip/ core strengthen today she report pain dropped  and she didn't feel any issue with walking or standing.    PT Next Visit Plan  hip strengthening in [facility] , Dry needling PRN , manual, modalities PRN, stretching of the hips, core strengthening. assess hip strength   PT Home Exercise Plan dead bug, sidelying external rotation,    Consulted and Agree with Plan of Care Patient      Patient will benefit from skilled therapeutic intervention in order to improve the following deficits and impairments:  Abnormal gait, Pain, Improper body mechanics, Postural dysfunction, Impaired flexibility, Decreased strength, Increased muscle spasms, Decreased endurance, Impaired tone, Increased fascial restricitons, Decreased range of motion  Visit Diagnosis: Pain in left hip  Muscle weakness (generalized)  Cramp and spasm  Abnormal posture     Problem List Patient Active Problem List   Diagnosis Date Noted  . Piriformis syndrome of left side 05/13/2015  . Greater trochanteric bursitis of left hip 11/25/2014  . Gluteal tendinitis of left buttock 11/25/2014  . Degenerative arthritis of hip 01/16/2014  . Acetabular labrum tear 01/16/2014   Starr Lake PT, DPT, LAT, ATC  06/30/2015  5:35 PM      [facility] [address] [place], [place], [address] Phone: [phone]   Fax:  [phone]  Name: BRITTISH BOLINGER MRN: [id number] Date of Birth: 04/20/1955    PHYSICAL THERAPY DISCHARGE SUMMARY  Visits from Start of Care: 5  Current functional level related to goals / functional outcomes: See goals   Remaining deficits: Unknown due to pt being inconsistent with therapy visits and not returning since last scheduled visit.    Education / Equipment: HEP,  theraband for strengthening.   Plan:  Patient goals were partially met. Patient is being discharged due to not returning since the last visit.  ?????        Kristoffer Leamon PT, DPT, LAT, ATC  07/30/2015  9:28 AM

## 2015-07-02 ENCOUNTER — Encounter: Payer: BLUE CROSS/BLUE SHIELD | Admitting: Physical Therapy

## 2015-07-16 ENCOUNTER — Ambulatory Visit: Payer: BLUE CROSS/BLUE SHIELD | Admitting: Physical Therapy

## 2015-07-21 ENCOUNTER — Ambulatory Visit: Payer: BLUE CROSS/BLUE SHIELD | Admitting: Physical Therapy

## 2015-07-22 ENCOUNTER — Telehealth: Payer: Self-pay | Admitting: Physical Therapy

## 2015-07-22 NOTE — Telephone Encounter (Signed)
Spoke with husband and he took message about her calling back about her cancelling all her appointments.

## 2015-07-28 ENCOUNTER — Encounter: Payer: BLUE CROSS/BLUE SHIELD | Admitting: Physical Therapy

## 2015-07-30 ENCOUNTER — Other Ambulatory Visit: Payer: Self-pay | Admitting: Obstetrics & Gynecology

## 2015-07-30 DIAGNOSIS — R87612 Low grade squamous intraepithelial lesion on cytologic smear of cervix (LGSIL): Secondary | ICD-10-CM | POA: Diagnosis not present

## 2015-08-31 DIAGNOSIS — R3915 Urgency of urination: Secondary | ICD-10-CM | POA: Diagnosis not present

## 2015-08-31 DIAGNOSIS — R102 Pelvic and perineal pain: Secondary | ICD-10-CM | POA: Diagnosis not present

## 2015-09-01 DIAGNOSIS — R102 Pelvic and perineal pain: Secondary | ICD-10-CM | POA: Diagnosis not present

## 2015-09-30 ENCOUNTER — Ambulatory Visit (INDEPENDENT_AMBULATORY_CARE_PROVIDER_SITE_OTHER): Payer: BLUE CROSS/BLUE SHIELD

## 2015-09-30 ENCOUNTER — Ambulatory Visit (INDEPENDENT_AMBULATORY_CARE_PROVIDER_SITE_OTHER): Payer: BLUE CROSS/BLUE SHIELD | Admitting: Podiatry

## 2015-09-30 ENCOUNTER — Encounter: Payer: Self-pay | Admitting: Podiatry

## 2015-09-30 VITALS — BP 114/80 | HR 89 | Resp 16 | Ht 66.5 in | Wt 173.0 lb

## 2015-09-30 DIAGNOSIS — M79672 Pain in left foot: Secondary | ICD-10-CM | POA: Diagnosis not present

## 2015-09-30 DIAGNOSIS — M7752 Other enthesopathy of left foot: Secondary | ICD-10-CM

## 2015-09-30 DIAGNOSIS — M79671 Pain in right foot: Secondary | ICD-10-CM

## 2015-09-30 DIAGNOSIS — M779 Enthesopathy, unspecified: Secondary | ICD-10-CM

## 2015-09-30 DIAGNOSIS — M778 Other enthesopathies, not elsewhere classified: Secondary | ICD-10-CM

## 2015-09-30 MED ORDER — TRIAMCINOLONE ACETONIDE 10 MG/ML IJ SUSP: 10.0000 mg | Freq: Once | INTRAMUSCULAR | Status: DC

## 2015-09-30 NOTE — Progress Notes (Signed)
   Subjective:    Patient ID: Samantha Cox, female    DOB: 05-24-1955, 60 y.o.   MRN: [id number]  HPI  Chief Complaint  Patient presents with  . Foot Pain    L foot plantar forefoot; "feels like I'm stepping on a rock"   . Toe Pain    L foot, 3rd and 4th toes        Review of Systems  Musculoskeletal: Positive for arthralgias, back pain and myalgias.  All other systems reviewed and are negative.      Objective:   Physical Exam        Assessment & Plan:

## 2015-10-01 NOTE — Progress Notes (Signed)
Subjective:     Patient ID: Samantha HoitNancy B Langer, female   DOB: Jun 05, 1955, 60 y.o.   MRN: [id number]  HPI patient states that she is getting a lot of discomfort in the left forefoot around the fourth metatarsal. States that also her arch feels that it is somewhat depressed and could be also giving her pain   Review of Systems  All other systems reviewed and are negative.      Objective:   Physical Exam  Constitutional: She is oriented to person, place, and time.  Cardiovascular: Intact distal pulses.   No murmur heard. Musculoskeletal: Normal range of motion.  Neurological: She is oriented to person, place, and time.  Skin: Skin is warm.  Nursing note and vitals reviewed.  neurovascular status found to be intact with muscle strength adequate range of motion within normal limits. Patient's found have inflammatory changes around the fourth MPJ left with fluid buildup and pain when palpated with moderate depression of the metatarsal. Patient has mild equinus condition and discomfort in the arch region left that also is contributory and is found to have good digital perfusion and is well oriented 3     Assessment:     Inflammatory capsulitis fourth MPJ left with fasciitis-like symptoms left    Plan:     H&P and x-rays reviewed and today injected around the fourth MPJ left intracapsular with quarter cc of dexamethasone quarter cc of Kenalog and applied thick plantar padding to reduce pressure on the joint and dispensed fascial brace to lift the arch up. Gave instructions on physical therapy and wearing rigid bottom shoes and reappoint to reevaluate  X-ray report was negative for signs of fracture or bony structural changes

## 2015-10-26 ENCOUNTER — Ambulatory Visit (INDEPENDENT_AMBULATORY_CARE_PROVIDER_SITE_OTHER): Payer: BLUE CROSS/BLUE SHIELD | Admitting: Podiatry

## 2015-10-26 DIAGNOSIS — M79672 Pain in left foot: Secondary | ICD-10-CM

## 2015-10-26 DIAGNOSIS — M7751 Other enthesopathy of right foot: Secondary | ICD-10-CM | POA: Diagnosis not present

## 2015-10-26 DIAGNOSIS — M779 Enthesopathy, unspecified: Secondary | ICD-10-CM

## 2015-10-26 DIAGNOSIS — M7752 Other enthesopathy of left foot: Secondary | ICD-10-CM

## 2015-10-26 DIAGNOSIS — M778 Other enthesopathies, not elsewhere classified: Secondary | ICD-10-CM

## 2015-10-26 NOTE — Progress Notes (Signed)
Subjective: Patient presents today for follow-up evaluation of capsulitis to the third and fourth MPJs of the left foot. Patient states that it's doing about the same. She does state that there have was improvement with the metatarsal pads. Patient's parents no improvement with the injections.   Objective: Physical Exam General: The patient is alert and oriented x3 in no acute distress.  Dermatology: Skin is warm, dry and supple bilateral lower extremities. Negative for open lesions or macerations.  Vascular: Palpable pedal pulses bilaterally. No edema or erythema noted. Capillary refill within normal limits.  Neurological: Epicritic and protective threshold grossly intact bilaterally.   Musculoskeletal Exam: Range of motion within normal limits to all pedal and ankle joints bilateral. Muscle strength 5/5 in all groups bilateral.   Pain on palpation and range of motion to the third and fourth MPJs left foot.  Assessment: #1 capsulitis third MPJ left foot #2 capsulitis fourth MPJ left foot #3 pain in left foot  Problem List Items Addressed This Visit    None    Visit Diagnoses   None.       Plan of Care:  #1 Patient was evaluated. #2 Today were going to scan the patient for custom orthotics with offloading metatarsal pads #3 patient is to return to clinic in 3 weeks for orthotic pickup     Dr. Felecia ShellingBrent M. Evans, DPM [facility]

## 2015-11-16 ENCOUNTER — Ambulatory Visit (INDEPENDENT_AMBULATORY_CARE_PROVIDER_SITE_OTHER): Payer: BLUE CROSS/BLUE SHIELD | Admitting: Podiatry

## 2015-11-16 DIAGNOSIS — M7752 Other enthesopathy of left foot: Secondary | ICD-10-CM

## 2015-11-16 DIAGNOSIS — M778 Other enthesopathies, not elsewhere classified: Secondary | ICD-10-CM

## 2015-11-16 DIAGNOSIS — M779 Enthesopathy, unspecified: Principal | ICD-10-CM

## 2015-11-16 NOTE — Progress Notes (Signed)
Patient ID: Samantha HoitNancy B Cox, female   DOB: [birth year], 60 y.o.   MRN: [id number]  Patient presents for orthotic pick up.  Verbal and written break in and wear instructions given.  Patient will follow up in 4 weeks if symptoms worsen or fail to improve.

## 2015-11-16 NOTE — Patient Instructions (Signed)

## 2015-12-24 ENCOUNTER — Ambulatory Visit: Payer: BLUE CROSS/BLUE SHIELD | Admitting: Podiatry

## 2016-01-22 DIAGNOSIS — Z23 Encounter for immunization: Secondary | ICD-10-CM | POA: Diagnosis not present

## 2016-02-08 DIAGNOSIS — R0981 Nasal congestion: Secondary | ICD-10-CM | POA: Insufficient documentation

## 2016-02-11 ENCOUNTER — Ambulatory Visit (INDEPENDENT_AMBULATORY_CARE_PROVIDER_SITE_OTHER): Payer: BLUE CROSS/BLUE SHIELD

## 2016-02-11 ENCOUNTER — Ambulatory Visit (INDEPENDENT_AMBULATORY_CARE_PROVIDER_SITE_OTHER): Payer: BLUE CROSS/BLUE SHIELD | Admitting: Podiatry

## 2016-02-11 DIAGNOSIS — M799 Soft tissue disorder, unspecified: Secondary | ICD-10-CM | POA: Diagnosis not present

## 2016-02-11 DIAGNOSIS — M778 Other enthesopathies, not elsewhere classified: Secondary | ICD-10-CM

## 2016-02-11 DIAGNOSIS — M795 Residual foreign body in soft tissue: Secondary | ICD-10-CM

## 2016-02-11 DIAGNOSIS — M7752 Other enthesopathy of left foot: Secondary | ICD-10-CM

## 2016-02-11 DIAGNOSIS — L03119 Cellulitis of unspecified part of limb: Secondary | ICD-10-CM | POA: Diagnosis not present

## 2016-02-11 DIAGNOSIS — M779 Enthesopathy, unspecified: Principal | ICD-10-CM

## 2016-02-11 MED ORDER — CLINDAMYCIN HCL 300 MG PO CAPS: 300.0000 mg | ORAL_CAPSULE | Freq: Three times a day (TID) | ORAL | 0 refills | Status: DC

## 2016-02-12 NOTE — Progress Notes (Signed)
Subjective: 61 year old female presents the office today for concerns of an "infected blister" to the left foot. She states this started about a week ago. She believes this coming from the orthotics that she got that she has been wearing the orthotic it was feeling well for some time until the last week. She also states that she's been wearing a tighter boot while riding horses which maybe contribute fracture. She denies tobacco any foreign objects however. Denies any systemic complaints such as fevers, chills, nausea, vomiting. No acute changes since last appointment, and no other complaints at this time.   Objective: AAO x3, NAD DP/PT pulses palpable bilaterally, CRT less than 3 seconds On the left foot submetatarsal 5 is a hyperkeratotic lesion. Upon debridement there is a superficial opening the skin. Upon debridement I was able to identify a foreign body which was removed and appeared to be a clear piece of glass. Upon further evaluation unable to identify any further foreign body. There is no pus expressed. There is localized edema to the area faint amount of erythema. There is no ascending cellulitis.  No open lesions or pre-ulcerative lesions.  No pain with calf compression, swelling, warmth, erythema      Assessment: Left foot foreign body  Plan: -All treatment options discussed with the patient including all alternatives, risks, complications.  -X-rays were obtained and reviewed. No evidence of deformity or acute fracture. Her tetanus status is up-to-date. Upon debridement of hyperkeratotic lesions performed body was removed. Recommended Epson salt soaks. Neosporin and a bandage daily. We'll start clindamycin. Follow up in 2 weeks if symptoms continue and there is not complete resolution. If she continues to have symptoms possible ultrasound to rule out any further foreign body. -Patient encouraged to call the office with any questions, concerns, change in symptoms.   Ovid CurdMatthew Wagoner,  DPM

## 2016-03-23 ENCOUNTER — Telehealth: Payer: Self-pay | Admitting: Family Medicine

## 2016-03-23 NOTE — Telephone Encounter (Signed)
[place] in [place]. [place]. But she has been down there for sometime and will be for a while longer for work. She wanted to know if she could get an rx for PT that she has been doing. So she can do it in [place] and her insurance pay for it. Please follow up with patient. Thank you.

## 2016-03-23 NOTE — Telephone Encounter (Signed)
Spoke to pt, she is going to call back tomorrow with the PT office fax #.

## 2016-03-23 NOTE — Telephone Encounter (Signed)
I am fine with it if we know where to send the referral.

## 2016-05-23 NOTE — Progress Notes (Signed)
[name] [address] [place], [place] [address] Phone: [phone] Subjective:    I'm seeing this patient by the request  of:    CC: Toe pain and left-sided pain.  [id number]  Samantha Cox is a 61 y.o. female coming in with complaint of Left foot pain. Patient has had this for quite some time. Patient states that unfortunately is having more of a pain and numbness in the foot. Saw a podiatrist. Patient was given an injection in the joint for a synovitis. No significant improvement. Concern is potentially coming from her back. Patient states that because pain in the fluid she has to change the way she walks. Denies noticing any swelling. States that when the pain size of numbness in the outside of her foot occurs. Patient states that she can have the foot pain without the back pain. The pain seems to start before the back pain.     Past Medical History:  Diagnosis Date  . History of tobacco use   . Routine general medical examination at a health care facility   . Screening for lipoid disorders    Past Surgical History:  Procedure Laterality Date  . OTHER SURGICAL HISTORY     Birthmark removed from forehead 7th grade   Social History   Social History  . Marital status: Married    Spouse name: N/A  . Number of children: N/A  . Years of education: N/A   Social History Main Topics  . Smoking status: Former Games developer  . Smokeless tobacco: Never Used  . Alcohol use None  . Drug use: Unknown  . Sexual activity: Not Asked   Other Topics Concern  . None   Social History Narrative  . None   Allergies  Allergen Reactions  . Augmentin [Amoxicillin-Pot Clavulanate] Diarrhea    Felt very bad Felt very bad    Family History  Problem Relation Age of Onset  . Hypertension Mother   . Atrial fibrillation Father   . CVA Father 3  . Bladder Cancer Father   . Lung cancer Brother   . Diverticulitis Brother     Past medical history,  social, surgical and family history all reviewed in electronic medical record.  No pertanent information unless stated regarding to the chief complaint.   Review of Systems:Review of systems updated and as accurate as of 05/24/16  No headache, visual changes, nausea, vomiting, diarrhea, constipation, dizziness, abdominal pain, skin rash, fevers, chills, night sweats, weight loss, swollen lymph nodes, body aches, joint swelling, muscle aches, chest pain, shortness of breath, mood changes.   Objective  Blood pressure 138/80, pulse (!) 147, resp. rate 16, weight 175 lb 8 oz (79.6 kg), SpO2 97 %. Systems examined below as of 05/24/16   General: No apparent distress alert and oriented x3 mood and affect normal, dressed appropriately.  HEENT: Pupils equal, extraocular movements intact  Respiratory: Patient's speak in full sentences and does not appear short of breath  Cardiovascular: No lower extremity edema, non tender, no erythema  Skin: Warm dry intact with no signs of infection or rash on extremities or on axial skeleton.  Abdomen: Soft nontender  Neuro: Cranial nerves II through XII are intact, neurovascularly intact in all extremities with 2+ DTRs and 2+ pulses.  Lymph: No lymphadenopathy of posterior or anterior cervical chain or axillae bilaterally.  Gait normal with good balance and coordination.  MSK:  Non tender with full range of motion and good stability and symmetric  strength and tone of shoulders, elbows, wrist, hip, knees bilaterally.  Neck exam is negative straight leg test with full strength of the lower extremities. Patient still has a positive Pearlean Brownie on the left side. Mild pain over the piriformis muscle. Foot exam shows severe breakdown of the transverse arch. Positive squeeze test. Full range of motion of the ankle. No tenderness over the medial or lateral malleolus  Limited muscular skeletal ultrasound was performed and interpreted by Judi Saa Limited ultrasound the  patient's left foot shows the patient since 2 neuromas between the third and fourth and fourth and fifth toes. No cortical defect. No synovitis noted. Impression, multiple neuromas of the left foot    Impression and Recommendations:     This case required medical decision making of moderate complexity.      Note: This dictation was prepared with Dragon dictation along with smaller phrase technology. Any transcriptional errors that result from this process are unintentional.

## 2016-05-24 ENCOUNTER — Encounter: Payer: Self-pay | Admitting: Family Medicine

## 2016-05-24 ENCOUNTER — Ambulatory Visit (INDEPENDENT_AMBULATORY_CARE_PROVIDER_SITE_OTHER): Payer: BLUE CROSS/BLUE SHIELD | Admitting: Family Medicine

## 2016-05-24 ENCOUNTER — Ambulatory Visit: Payer: Self-pay

## 2016-05-24 VITALS — BP 138/80 | HR 147 | Resp 16 | Wt 175.5 lb

## 2016-05-24 DIAGNOSIS — G5762 Lesion of plantar nerve, left lower limb: Secondary | ICD-10-CM

## 2016-05-24 DIAGNOSIS — M79673 Pain in unspecified foot: Secondary | ICD-10-CM | POA: Diagnosis not present

## 2016-05-24 MED ORDER — GABAPENTIN 100 MG PO CAPS: 200.0000 mg | ORAL_CAPSULE | Freq: Every day | ORAL | 3 refills | Status: DC

## 2016-05-24 NOTE — Progress Notes (Signed)
Pre-visit discussion using our clinic review tool. No additional management support is needed unless otherwise documented below in the visit note.  

## 2016-05-24 NOTE — Patient Instructions (Signed)
Good to see you.  Ice the ankle 10 minutes 1-2 times a day  pennsaid pinkie amount topically 2 times daily as needed.  Gabapentin  at night Exercises 3 times a week.  Spenco orthotics "total support" online would be great  Avoid being barefoot Look into xelero, kelso or allegria shoes See me again in 4 weeks and if not better let me inject.

## 2016-05-24 NOTE — Assessment & Plan Note (Signed)
2 neuromas noted today. We discussed icing regimen and home exercises. We discussed objective is to do a which ones to avoid. We discussed proper shoes. Metatarsal pads, we discussed over-the-counter orthotics. Topical anti-inflammatories given. Patient also started on gabapentin. Differential includes lumbar radiculopathy but not likely with a negative straight leg test. Patient will come back and see me again in 4 weeks. Worsening symptoms consider injection

## 2016-06-08 ENCOUNTER — Other Ambulatory Visit: Payer: Self-pay | Admitting: Acute Care

## 2016-06-08 ENCOUNTER — Telehealth: Payer: Self-pay | Admitting: Acute Care

## 2016-06-08 DIAGNOSIS — Z87891 Personal history of nicotine dependence: Secondary | ICD-10-CM

## 2016-06-08 NOTE — Telephone Encounter (Signed)
Samantha Cox   Pt states she is returning your call, pt called concerned about the cost of getting the CT and she stated she does not want this done at [facility] that she is going to have this done at [facility]. Since this will not be done at [facility] she wanted to discuss with you if she still needs her appt with [facility]

## 2016-06-08 NOTE — Telephone Encounter (Signed)
Spoke with pt.  She has questions regarding billing cost for CT at [address].  I gave her the number to [facility] to check the charges.  Pt t ocall me back with update.

## 2016-06-13 NOTE — Telephone Encounter (Signed)
See phone note 06/08/16.  This note will be closed.

## 2016-06-13 NOTE — Telephone Encounter (Signed)
Spoke with pt.  She wants to cancel shared decision with Maralyn SagoSarah and will get PCP to [facility] due to her having to pay out of pocket for CT due to high deductible.SDMV and CT order canceled.

## 2016-06-15 ENCOUNTER — Telehealth: Payer: Self-pay | Admitting: Acute Care

## 2016-06-15 ENCOUNTER — Other Ambulatory Visit: Payer: Self-pay | Admitting: Family Medicine

## 2016-06-15 DIAGNOSIS — Z87891 Personal history of nicotine dependence: Secondary | ICD-10-CM

## 2016-06-15 NOTE — Telephone Encounter (Signed)
[id number] x1 

## 2016-06-16 NOTE — Telephone Encounter (Signed)
PCP needs to do the Pre-Cert for this CT as the patient refused appt with our office and had PCP to order LDCT.  LDCT has been ordered by Laurann Montanaynthia White, MD   [facility] - LM advising of this information and requesting a call back to see if anything further needed.    Jenna LuoPhelps, Denise D, RN  06/08/16 9:00 AM   Note  Spoke with pt.  She wants to cancel shared decision with Maralyn SagoSarah and will get PCP to order CT [facility] due to her having to pay out of pocket for CT due to high deductible.SDMV and CT order canceled.

## 2016-06-22 ENCOUNTER — Ambulatory Visit: Payer: BLUE CROSS/BLUE SHIELD | Admitting: Family Medicine

## 2016-06-27 ENCOUNTER — Encounter: Payer: BLUE CROSS/BLUE SHIELD | Admitting: Acute Care

## 2016-06-28 ENCOUNTER — Ambulatory Visit: Payer: BLUE CROSS/BLUE SHIELD

## 2016-06-28 ENCOUNTER — Ambulatory Visit
Admission: RE | Admit: 2016-06-28 | Discharge: 2016-06-28 | Disposition: A | Discharge status: Home / Self Care | Payer: BLUE CROSS/BLUE SHIELD | Source: Ambulatory Visit | Attending: Family Medicine | Admitting: Family Medicine

## 2016-06-28 DIAGNOSIS — Z87891 Personal history of nicotine dependence: Secondary | ICD-10-CM

## 2016-07-07 ENCOUNTER — Ambulatory Visit (INDEPENDENT_AMBULATORY_CARE_PROVIDER_SITE_OTHER): Payer: BLUE CROSS/BLUE SHIELD | Admitting: Family Medicine

## 2016-07-07 ENCOUNTER — Encounter: Payer: Self-pay | Admitting: Family Medicine

## 2016-07-07 ENCOUNTER — Ambulatory Visit: Payer: Self-pay

## 2016-07-07 VITALS — BP 138/84 | HR 86 | Ht 67.0 in | Wt 173.0 lb

## 2016-07-07 DIAGNOSIS — G5762 Lesion of plantar nerve, left lower limb: Secondary | ICD-10-CM

## 2016-07-07 NOTE — Progress Notes (Signed)
Tawana ScaleZach Brandilyn Nanninga D.O. [facility] [address] [place], [place] [address] Phone: [phone] Subjective:    I'm seeing this patient by the request  of:    CC: Toe pain and left-sided pain f/u   BJY:[id number]:Subjective  Samantha Cox is a 61 y.o. female coming in with complaint of Left foot pain. Patient was found to have more of a neuroma overall. Patient states that unfortunately even with conservative therapy no significant improvement. Seems 17 lateral aspect the foot continues to be numb. Patient states that unfortunately and is affecting her work. Pain is moderate to severe.     Past Medical History:  Diagnosis Date  . History of tobacco use   . Routine general medical examination at a health care facility   . Screening for lipoid disorders    Past Surgical History:  Procedure Laterality Date  . OTHER SURGICAL HISTORY     Birthmark removed from forehead 7th grade   Social History   Social History  . Marital status: Married    Spouse name: N/A  . Number of children: N/A  . Years of education: N/A   Social History Main Topics  . Smoking status: Former Games developermoker  . Smokeless tobacco: Never Used  . Alcohol use Not on file  . Drug use: Unknown  . Sexual activity: Not on file   Other Topics Concern  . Not on file   Social History Narrative  . No narrative on file   Allergies  Allergen Reactions  . Augmentin [Amoxicillin-Pot Clavulanate] Diarrhea    Felt very bad Felt very bad    Family History  Problem Relation Age of Onset  . Hypertension Mother   . Atrial fibrillation Father   . CVA Father [phone]  . Bladder Cancer Father   . Lung cancer Brother   . Diverticulitis Brother     Past medical history, social, surgical and family history all reviewed in electronic medical record.  No pertanent information unless stated regarding to the chief complaint.   Review of Systems: No headache, visual changes, nausea, vomiting, diarrhea, constipation, dizziness,  abdominal pain, skin rash, fevers, chills, night sweats, weight loss, swollen lymph nodes, body aches, joint swelling, muscle aches, chest pain, shortness of breath, mood changes.    Objective  There were no vitals taken for this visit.   Systems examined below as of 07/07/16 General: NAD A&O x3 mood, affect normal  HEENT: Pupils equal, extraocular movements intact no nystagmus Respiratory: not short of breath at rest or with speaking Cardiovascular: No lower extremity edema, non tender Skin: Warm dry intact with no signs of infection or rash on extremities or on axial skeleton. Abdomen: Soft nontender, no masses Neuro: Cranial nerves  intact, neurovascularly intact in all extremities with 2+ DTRs and 2+ pulses. Lymph: No lymphadenopathy appreciated today  Gait normal with good balance and coordination.  MSK: Non tender with full range of motion and good stability and symmetric strength and tone of shoulders, elbows, wrist,  knee hips and ankles bilaterally.   Foot exam shows severe breakdown of the transverse arch. Positive squeeze test.patient also has some very mild numbness on the lateral aspect of foot.  Procedure: Real-time Ultrasound Guided Injection of  Left neuroma Device: GE Logiq Q7 Ultrasound guided injection is preferred based studies that show increased duration, increased effect, greater accuracy, decreased procedural pain, increased response rate, and decreased cost with ultrasound guided versus blind injection.  Verbal informed consent obtained.  Time-out conducted.  Noted no  overlying erythema, induration, or other signs of local infection.  Skin prepped in a sterile fashion.  Local anesthesia: Topical Ethyl chloride.  With sterile technique and under real time ultrasound guidance:  With a 25-gauge half-inch needle patient was injected with a total of 0.5 mL of 0.5% Marcaine and 0.5 mL of Kenalog 40 mg/dL into theneuroma of thefourth and fifth interstitial  space Completed without difficulty  Pain immediately resolved suggesting accurate placement of the medication.  Advised to call if fevers/chills, erythema, induration, drainage, or persistent bleeding.  Images permanently stored and available for review in the ultrasound unit.  Impression: Technically successful ultrasound guided injection.      Impression and Recommendations:     This case required medical decision making of moderate complexity.      Note: This dictation was prepared with Dragon dictation along with smaller phrase technology. Any transcriptional errors that result from this process are unintentional.

## 2016-07-07 NOTE — Assessment & Plan Note (Addendum)
Worsening symptoms Patient given injection today. Patient tolerated the procedure fairly well. We discussed icing regimen and home exercises. We discussed which activities doing which ones to avoid. She will continue active. Patient will do the exercises. May need custom orthotics in the long run. Follow-up again in 4-6 weeks

## 2016-07-07 NOTE — Patient Instructions (Signed)
Great to see yo u Good luck at the horse show.  Exercises started Monday again  Keep wearing the shoes See me again in 4 weeks.

## 2016-07-13 ENCOUNTER — Telehealth: Payer: Self-pay | Admitting: Family Medicine

## 2016-07-13 NOTE — Telephone Encounter (Signed)
Dr. Berline Choughigby can you please advise while Dr. Katrinka BlazingSmith is out of the office?

## 2016-07-13 NOTE — Telephone Encounter (Signed)
Pt called stating that she recently had two injections in her foot. She has noticed that one of the areas is not any better and that there is a tingling/itcy feeling on her whole foot. How should she proceed or what would you recommend?

## 2016-07-13 NOTE — Telephone Encounter (Signed)
As long as she is not having any significant redness or significant swelling this is something that we can continue to watch.  If she is concerned I am happy to see her in clinic tomorrow   tomorrow morning.

## 2016-07-14 NOTE — Telephone Encounter (Signed)
lmovm for pt to return call.  

## 2016-07-18 NOTE — Telephone Encounter (Signed)
Spoke to pt, she would like to come back in to see dr Katrinka Blazingsmith. appt scheduled 6.18.18 @345pm .

## 2016-07-23 NOTE — Progress Notes (Signed)
Tawana ScaleZach Jennalyn Cawley D.O. [facility] [address] [place], [place] [address] Phone: [phone] Subjective:    I'm seeing this patient by the request  of:    CC: Toe pain and left-sided pain f/u   NFA:[id number]:Subjective  Samantha Cox is a 61 y.o. female coming in with complaint of Left foot pain. Patient was found to have more of a neuroma overall. Given injection 2 weeks ago. Patient states that the pain worsened over the course of the first week. Now the pain seems to be getting better again. About the same as it was prior to the injections. Patient denies any different pain. Seems to be the seem otherwise.     Past Medical History:  Diagnosis Date  . History of tobacco use   . Routine general medical examination at a health care facility   . Screening for lipoid disorders    Past Surgical History:  Procedure Laterality Date  . OTHER SURGICAL HISTORY     Birthmark removed from forehead 7th grade   Social History   Social History  . Marital status: Married    Spouse name: N/A  . Number of children: N/A  . Years of education: N/A   Social History Main Topics  . Smoking status: Former Games developermoker  . Smokeless tobacco: Never Used  . Alcohol use None  . Drug use: Unknown  . Sexual activity: Not Asked   Other Topics Concern  . None   Social History Narrative  . None   Allergies  Allergen Reactions  . Augmentin [Amoxicillin-Pot Clavulanate] Diarrhea    Felt very bad Felt very bad    Family History  Problem Relation Age of Onset  . Hypertension Mother   . Atrial fibrillation Father   . CVA Father [phone]  . Bladder Cancer Father   . Lung cancer Brother   . Diverticulitis Brother     Past medical history, social, surgical and family history all reviewed in electronic medical record.  No pertanent information unless stated regarding to the chief complaint.   Review of Systems: No headache, visual changes, nausea, vomiting, diarrhea, constipation, dizziness, abdominal  pain, skin rash, fevers, chills, night sweats, weight loss, swollen lymph nodes, body aches, joint swelling, muscle aches, chest pain, shortness of breath, mood changes.     Objective  Blood pressure 104/76, pulse 88, height 5\' 7"  (1.702 m), weight 175 lb (79.4 kg), SpO2 95 %.   Systems examined below as of 07/25/16 General: NAD A&O x3 mood, affect normal  HEENT: Pupils equal, extraocular movements intact no nystagmus Respiratory: not short of breath at rest or with speaking Cardiovascular: No lower extremity edema, non tender Skin: Warm dry intact with no signs of infection or rash on extremities or on axial skeleton. Abdomen: Soft nontender, no masses Neuro: Cranial nerves  intact, neurovascularly intact in all extremities with 2+ DTRs and 2+ pulses. Lymph: No lymphadenopathy appreciated today  Gait normal with good balance and coordination.  MSK: Non tender with full range of motion and good stability and symmetric strength and tone of shoulders, elbows, wrist,  knee hips and ankles bilaterally.   Foot exam shows severe breakdown of the transverse arch. Positive squeeze test still present but possibly less.       Impression and Recommendations:     This case required medical decision making of moderate complexity.      Note: This dictation was prepared with Dragon dictation along with smaller phrase technology. Any transcriptional errors that result from  this process are unintentional.

## 2016-07-25 ENCOUNTER — Ambulatory Visit (INDEPENDENT_AMBULATORY_CARE_PROVIDER_SITE_OTHER): Payer: BLUE CROSS/BLUE SHIELD | Admitting: Family Medicine

## 2016-07-25 ENCOUNTER — Encounter: Payer: Self-pay | Admitting: Family Medicine

## 2016-07-25 DIAGNOSIS — G5762 Lesion of plantar nerve, left lower limb: Secondary | ICD-10-CM | POA: Diagnosis not present

## 2016-07-25 MED ORDER — MELOXICAM 15 MG PO TABS: 15.0000 mg | ORAL_TABLET | Freq: Every day | ORAL | 0 refills | Status: DC

## 2016-07-25 MED ORDER — VENLAFAXINE HCL ER 37.5 MG PO CP24: 37.5000 mg | ORAL_CAPSULE | Freq: Every day | ORAL | 1 refills | Status: DC

## 2016-07-25 NOTE — Patient Instructions (Signed)
Good to see you  Do not lace last eye Effexor 37.5 mg daily for a week alone If no improvement start meloxicam daily in 2nd week See me again in 3rd week Do not lace last eye

## 2016-07-25 NOTE — Assessment & Plan Note (Signed)
No improvement.  Discussed icing, home exercise program.  Does respond to NSAIDs.  Patient given meloxicam. Also started on low dose of Effexor to help with the neurologic component. Discussed the possibility of repeating injections. Also we discussed the possibility of an EMG. Patient will consider it in follow-up again in 2-4 weeks.

## 2016-07-28 ENCOUNTER — Other Ambulatory Visit: Payer: Self-pay

## 2016-07-28 ENCOUNTER — Telehealth: Payer: Self-pay | Admitting: Family Medicine

## 2016-07-28 MED ORDER — DICLOFENAC SODIUM 2 % TD SOLN: 2.0000 g | Freq: Two times a day (BID) | TRANSDERMAL | 3 refills | Status: DC

## 2016-07-28 NOTE — Progress Notes (Unsigned)
pe

## 2016-07-28 NOTE — Telephone Encounter (Signed)
Patient is requesting script for pennsaid

## 2016-07-28 NOTE — Telephone Encounter (Signed)
Called patient to let her know that we called in an Rx for Pennsaid.

## 2016-08-10 NOTE — Progress Notes (Signed)
[name] D.O. [facility] [address] [place], [place] [address] Phone: [phone] Subjective:    I'm seeing this patient by the request  of:    CC: Toe pain and left-sided pain f/u   KGM:[id number]:Subjective  Samantha Cox B Cendejas is a 61 y.o. female coming in with complaint of Left foot pain. Patient was found to have more of a neuroma overall. Patient was given a neuroma injection previously. Patient wanted to continue with conservative treatment and started on Effexor and meloxicam in addition to her gabapentin. Patient states mild improvement.  Worsening pain in the piriformis.  Did do well with injection 2 months ago. Having worsening pain going down the leg. Describes the pain as a dull, throbbing aching sensation. Sometimes a sharp pain     Past Medical History:  Diagnosis Date  . History of tobacco use   . Routine general medical examination at a health care facility   . Screening for lipoid disorders    Past Surgical History:  Procedure Laterality Date  . OTHER SURGICAL HISTORY     Birthmark removed from forehead 7th grade   Social History   Social History  . Marital status: Married    Spouse name: N/A  . Number of children: N/A  . Years of education: N/A   Social History Main Topics  . Smoking status: Former Games developermoker  . Smokeless tobacco: Never Used  . Alcohol use None  . Drug use: Unknown  . Sexual activity: Not Asked   Other Topics Concern  . None   Social History Narrative  . None   Allergies  Allergen Reactions  . Augmentin [Amoxicillin-Pot Clavulanate] Diarrhea    Felt very bad Felt very bad    Family History  Problem Relation Age of Onset  . Hypertension Mother   . Atrial fibrillation Father   . CVA Father [phone]  . Bladder Cancer Father   . Lung cancer Brother   . Diverticulitis Brother     Past medical history, social, surgical and family history all reviewed in electronic medical record.  No pertanent information unless stated  regarding to the chief complaint.   Review of Systems: No headache, visual changes, nausea, vomiting, diarrhea, constipation, dizziness, abdominal pain, skin rash, fevers, chills, night sweats, weight loss, swollen lymph nodes, body aches, joint swelling, muscle aches, chest pain, shortness of breath, mood changes.    Objective  Blood pressure 124/82, pulse 85, height 5\' 7"  (1.702 m), weight 175 lb (79.4 kg), SpO2 96 %.   Systems examined below as of 08/11/16 General: NAD A&O x3 mood, affect normal  HEENT: Pupils equal, extraocular movements intact no nystagmus Respiratory: not short of breath at rest or with speaking Cardiovascular: No lower extremity edema, non tender Skin: Warm dry intact with no signs of infection or rash on extremities or on axial skeleton. Abdomen: Soft nontender, no masses Neuro: Cranial nerves  intact, neurovascularly intact in all extremities with 2+ DTRs and 2+ pulses. Lymph: No lymphadenopathy appreciated today  Gait normal with good balance and coordination.  MSK: Non tender with full range of motion and good stability and symmetric strength and tone of shoulders, elbows, wrist,  knee hips and ankles bilaterally.   Back Exam:  Inspection: Mild loss of lordosis Motion: Flexion 45 deg, Extension 25 deg, Side Bending to 30 deg bilaterally,  Rotation to 35 deg bilaterally  SLR laying: Left-sided XSLR laying: Negative  Palpable tenderness: Tender to palpation in the paraspinal musculature. More of the left  sign. FABER: Positive left. Sensory change: Gross sensation intact to all lumbar and sacral dermatomes.  Reflexes: 2+ at both patellar tendons, 2+ at achilles tendons, Babinski's downgoing.  Strength at foot  Plantar-flexion: 5/5 Dorsi-flexion: 5/5 Eversion: 5/5 Inversion: 5/5  Leg strength  Quad: 5/5 Hamstring: 5/5 Hip flexor: 5/5 Hip abductors: 4/5 but symmetric Gait unremarkable.  Procedure note After verbal consent patient was prepped with call swabs  and with a 21-gauge 3 and she was injected into the left piriformis with a total of 3 mL of 0.5% Marcaine and 1 mL of Kenalog 40 mg/dL. No blood loss. Post injection instructions given and Band-Aid placed   Impression and Recommendations:     This case required medical decision making of moderate complexity.      Note: This dictation was prepared with Dragon dictation along with smaller phrase technology. Any transcriptional errors that result from this process are unintentional.

## 2016-08-11 ENCOUNTER — Encounter: Payer: Self-pay | Admitting: Family Medicine

## 2016-08-11 ENCOUNTER — Ambulatory Visit (INDEPENDENT_AMBULATORY_CARE_PROVIDER_SITE_OTHER)
Admission: RE | Admit: 2016-08-11 | Discharge: 2016-08-11 | Disposition: A | Discharge status: Home / Self Care | Payer: BLUE CROSS/BLUE SHIELD | Source: Ambulatory Visit | Attending: Family Medicine | Admitting: Family Medicine

## 2016-08-11 ENCOUNTER — Ambulatory Visit (INDEPENDENT_AMBULATORY_CARE_PROVIDER_SITE_OTHER): Payer: BLUE CROSS/BLUE SHIELD | Admitting: Family Medicine

## 2016-08-11 VITALS — BP 124/82 | HR 85 | Ht 67.0 in | Wt 175.0 lb

## 2016-08-11 DIAGNOSIS — M545 Low back pain: Secondary | ICD-10-CM

## 2016-08-11 DIAGNOSIS — G5702 Lesion of sciatic nerve, left lower limb: Secondary | ICD-10-CM | POA: Diagnosis not present

## 2016-08-11 NOTE — Patient Instructions (Signed)
Good to see you  Injected the piriformis today and I hope it helps Xray of back downstairs Duexis 3 times a day for 6 days and then tell me what you think See me again in 3-6 weeks.

## 2016-08-11 NOTE — Assessment & Plan Note (Signed)
Given injection today and tolerated the procedure well. We discussed icing regimen and home exercises. We discussed which activities doing which ones to avoid. Patient will increase activity as tolerated. Worsening symptoms this could be more of a lumbar radiculopathy. X-rays pending. Worsening symptoms MRI may be necessary.

## 2016-08-15 ENCOUNTER — Telehealth: Payer: Self-pay | Admitting: Family Medicine

## 2016-08-15 NOTE — Telephone Encounter (Signed)
lmovm for pt to return call.  

## 2016-08-15 NOTE — Telephone Encounter (Signed)
Pt would like a prescription for Duexis she liked the sample,  She stated Ian MalkinZach would know what pharmacy to send it too, she said it is a special pharmacy in [place] that will mail it to her .     Pt would like a refill of methocarbamol (ROBAXIN) 500 MG tablet [place]

## 2016-08-16 ENCOUNTER — Other Ambulatory Visit: Payer: Self-pay | Admitting: *Deleted

## 2016-08-16 MED ORDER — IBUPROFEN-FAMOTIDINE 800-26.6 MG PO TABS: ORAL_TABLET | ORAL | 1 refills | Status: DC

## 2016-08-16 MED ORDER — METHOCARBAMOL 500 MG PO TABS: 500.0000 mg | ORAL_TABLET | Freq: Three times a day (TID) | ORAL | 0 refills | Status: DC | PRN

## 2016-08-16 MED ORDER — DICLOFENAC SODIUM 2 % TD SOLN: 2.0000 g | Freq: Two times a day (BID) | TRANSDERMAL | 3 refills | Status: DC

## 2016-08-16 NOTE — Telephone Encounter (Signed)
rx sent into pharmacies.

## 2016-08-16 NOTE — Telephone Encounter (Signed)
Duexis sent to pharmacy.

## 2016-09-18 NOTE — Progress Notes (Signed)
[name] [address] [place], [place] [address] Phone: [phone] Subjective:    I'm seeing this patient by the request  of:    CC: Toe pain and left-sided pain f/u   BJY:NWGNFAOZHY  Samantha Cox is a 61 y.o. female coming in with complaint of Left foot pain. Patient was found to have more of a neuroma overall. Patient was given a neuroma injection previously. Patient wanted to continue with conservative treatment and started on   Gabapentin which patient was not taking and did not notice improvement. Patient has not started Effexor. Patient states that the toe pain overall seems to be better. Unfortunately patient is having worsening cramping pain more in the legs. States that none of the medication seemed to be beneficial. Worsening tightness of the lower back and legs in the mornings. Seems to get better with activity.    Past Medical History:  Diagnosis Date  . History of tobacco use   . Routine general medical examination at a health care facility   . Screening for lipoid disorders    Past Surgical History:  Procedure Laterality Date  . OTHER SURGICAL HISTORY     Birthmark removed from forehead 7th grade   Social History   Social History  . Marital status: Married    Spouse name: N/A  . Number of children: N/A  . Years of education: N/A   Social History Main Topics  . Smoking status: Former Games developer  . Smokeless tobacco: Never Used  . Alcohol use None  . Drug use: Unknown  . Sexual activity: Not Asked   Other Topics Concern  . None   Social History Narrative  . None   Allergies  Allergen Reactions  . Augmentin [Amoxicillin-Pot Clavulanate] Diarrhea    Felt very bad Felt very bad    Family History  Problem Relation Age of Onset  . Hypertension Mother   . Atrial fibrillation Father   . CVA Father 21  . Bladder Cancer Father   . Lung cancer Brother   . Diverticulitis Brother     Past medical history, social,  surgical and family history all reviewed in electronic medical record.  No pertanent information unless stated regarding to the chief complaint.   Review of Systems: No headache, visual changes, nausea, vomiting, diarrhea, constipation, dizziness, abdominal pain, skin rash, fevers, chills, night sweats, weight loss, swollen lymph nodes, body aches, joint swelling, chest pain, shortness of breath, mood changes.  Positive muscle aches   Objective  Blood pressure 112/78, pulse 88, height 5' 6.5" (1.689 m), weight 172 lb (78 kg).   Systems examined below as of 09/19/16 General: NAD A&O x3 mood, affect normal  HEENT: Pupils equal, extraocular movements intact no nystagmus Respiratory: not short of breath at rest or with speaking Cardiovascular: No lower extremity edema, non tender Skin: Warm dry intact with no signs of infection or rash on extremities or on axial skeleton. Abdomen: Soft nontender, no masses Neuro: Cranial nerves  intact, neurovascularly intact in all extremities with 2+ DTRs and 2+ pulses. Lymph: No lymphadenopathy appreciated today  Gait normal with good balance and coordination.  MSK: Non tender with full range of motion and good stability and symmetric strength and tone of shoulders, elbows, wrist,  knee hips and ankles bilaterally.    Back Exam:  Inspection: Unremarkable  Motion: Flexion 45 deg, Extension 25 deg, Side Bending to 35 deg bilaterally,  Rotation to 35 deg bilaterally  SLR laying: Negative  XSLR laying: Negative  Palpable tenderness: Mild tenderness in the paraspinal musculature lumbar spine. FABER: negative. Sensory change: Gross sensation intact to all lumbar and sacral dermatomes.  Reflexes: 2+ at both patellar tendons, 2+ at achilles tendons, Babinski's downgoing.  Strength at foot  Plantar-flexion: 5/5 Dorsi-flexion: 5/5 Eversion: 5/5 Inversion: 5/5  Leg strength  Quad: 5/5 Hamstring: 5/5 Hip flexor: 5/5 Hip abductors: 5/5  Gait unremarkable.     Impression and Recommendations:     This case required medical decision making of moderate complexity.      Note: This dictation was prepared with Dragon dictation along with smaller phrase technology. Any transcriptional errors that result from this process are unintentional.

## 2016-09-19 ENCOUNTER — Ambulatory Visit (INDEPENDENT_AMBULATORY_CARE_PROVIDER_SITE_OTHER): Payer: BLUE CROSS/BLUE SHIELD | Admitting: Family Medicine

## 2016-09-19 ENCOUNTER — Encounter: Payer: Self-pay | Admitting: Family Medicine

## 2016-09-19 VITALS — BP 112/78 | HR 88 | Ht 66.5 in | Wt 172.0 lb

## 2016-09-19 DIAGNOSIS — M2550 Pain in unspecified joint: Secondary | ICD-10-CM

## 2016-09-19 DIAGNOSIS — R252 Cramp and spasm: Secondary | ICD-10-CM | POA: Diagnosis not present

## 2016-09-19 NOTE — Assessment & Plan Note (Signed)
Patient is having significant leg cramping. Seems to be mostly at night. It's possible laboratory abnormality and I do feel a laboratory workup is necessary at this point. In addition of this we discussed the possibility of a lumbar radiculopathy and we'll continue to monitor. Patient has known osteophytic changes. Patient also could have iron deficiency anemia. We discussed the possibility of sleep apnea with this happening mostly at night. We will see what laboratory workup shows but if unremarkable and would like to start with a possible sleep study. Then if no significant findings we will consider further evaluation of the lumbar radiculopathy. Patient is in agreement with this plan. Has muscle relaxer for breakthrough pain, discussed over-the-counter medications. Discussed proper sleep hygiene

## 2016-09-19 NOTE — Patient Instructions (Addendum)
Good to see you  Samantha Cox is your friend.  Stay active  We will get labs If labs are normal then we will get sleep study  If sleep study is normal then we will consider starting the effexor or doing MRI of the back  We will discuss.

## 2016-09-20 ENCOUNTER — Other Ambulatory Visit: Payer: Self-pay | Admitting: *Deleted

## 2016-09-20 ENCOUNTER — Other Ambulatory Visit: Payer: Self-pay

## 2016-09-20 ENCOUNTER — Other Ambulatory Visit (INDEPENDENT_AMBULATORY_CARE_PROVIDER_SITE_OTHER): Payer: BLUE CROSS/BLUE SHIELD

## 2016-09-20 ENCOUNTER — Other Ambulatory Visit: Payer: Self-pay | Admitting: Family Medicine

## 2016-09-20 DIAGNOSIS — M2550 Pain in unspecified joint: Secondary | ICD-10-CM

## 2016-09-20 LAB — COMPREHENSIVE METABOLIC PANEL
ALT: 23 U/L (ref 0–35)
AST: 23 U/L (ref 0–37)
Albumin: 4.3 g/dL (ref 3.5–5.2)
Alkaline Phosphatase: 93 U/L (ref 39–117)
BUN: 23 mg/dL (ref 6–23)
CO2: 24 mEq/L (ref 19–32)
Calcium: 9.3 mg/dL (ref 8.4–10.5)
Chloride: 107 mEq/L (ref 96–112)
Creatinine, Ser: 0.75 mg/dL (ref 0.40–1.20)
GFR: 84.17 mL/min (ref >60.00)
Glucose, Bld: 124 mg/dL — ABNORMAL HIGH (ref 70–99)
Potassium: 3.5 mEq/L (ref 3.5–5.1)
Sodium: 139 mEq/L (ref 135–145)
Total Bilirubin: 0.6 mg/dL (ref 0.2–1.2)
Total Protein: 6.7 g/dL (ref 6.0–8.3)

## 2016-09-20 LAB — CBC WITH DIFFERENTIAL/PLATELET
Basophils Absolute: 0.1 10*3/uL (ref 0.0–0.1)
Basophils Relative: 1 % (ref 0.0–3.0)
Eosinophils Absolute: 0.1 10*3/uL (ref 0.0–0.7)
Eosinophils Relative: 1.7 % (ref 0.0–5.0)
HCT: 43.4 % (ref 36.0–46.0)
Hemoglobin: 14.7 g/dL (ref 12.0–15.0)
Lymphocytes Relative: 29.2 % (ref 12.0–46.0)
Lymphs Abs: 2.3 10*3/uL (ref 0.7–4.0)
MCHC: 33.8 g/dL (ref 30.0–36.0)
MCV: 97 fl (ref 78.0–100.0)
Monocytes Absolute: 0.5 10*3/uL (ref 0.1–1.0)
Monocytes Relative: 5.9 % (ref 3.0–12.0)
Neutro Abs: 5 10*3/uL (ref 1.4–7.7)
Neutrophils Relative %: 62.2 % (ref 43.0–77.0)
Platelets: 187 10*3/uL (ref 150.0–400.0)
RBC: 4.47 Mil/uL (ref 3.87–5.11)
RDW: 13.4 % (ref 11.5–15.5)
WBC: 8 10*3/uL (ref 4.0–10.5)

## 2016-09-20 LAB — TSH: TSH: 2.06 u[IU]/mL (ref 0.35–4.50)

## 2016-09-20 LAB — SEDIMENTATION RATE: Sed Rate: 8 mm/h (ref 0–30)

## 2016-09-20 LAB — IRON: Iron: 105 ug/dL (ref 42–145)

## 2016-09-20 LAB — C-REACTIVE PROTEIN: CRP: 0.1 mg/dL — ABNORMAL LOW (ref 0.5–20.0)

## 2016-09-20 LAB — VITAMIN D 25 HYDROXY (VIT D DEFICIENCY, FRACTURES): VITD: 32.18 ng/mL (ref 30.00–100.00)

## 2016-09-21 LAB — ANA
Anti Nuclear Antibody(ANA): POSITIVE — AB
Anti Nuclear Antibody(ANA): POSITIVE — AB

## 2016-09-21 LAB — ANGIOTENSIN CONVERTING ENZYME: Angiotensin-Converting Enzyme: 49 U/L (ref 9–67)

## 2016-09-21 LAB — ANTI-NUCLEAR AB-TITER (ANA TITER)
ANA Titer 1: 1:40 {titer} — ABNORMAL HIGH
ANA Titer 1: 1:40 {titer} — ABNORMAL HIGH

## 2016-09-21 LAB — LYME AB/WESTERN BLOT REFLEX: B burgdorferi Ab IgG+IgM: <0.9 Index (ref <0.90)

## 2016-09-21 LAB — RHEUMATOID FACTOR: Rhuematoid fact SerPl-aCnc: <14 IU/mL (ref <14)

## 2016-09-22 LAB — PTH, INTACT AND CALCIUM
Calcium: 9.4 mg/dL (ref 8.6–10.4)
PTH: 29 pg/mL (ref 14–64)

## 2016-09-22 LAB — CYCLIC CITRUL PEPTIDE ANTIBODY, IGG: Cyclic Citrullin Peptide Ab: <16 Units

## 2016-09-23 ENCOUNTER — Encounter: Payer: Self-pay | Admitting: Family Medicine

## 2016-09-23 DIAGNOSIS — M5416 Radiculopathy, lumbar region: Secondary | ICD-10-CM

## 2016-09-23 LAB — LYME AB/WESTERN BLOT REFLEX: B burgdorferi Ab IgG+IgM: <0.9 Index (ref <0.90)

## 2016-09-29 NOTE — Telephone Encounter (Signed)
Pt called upset because she was not informed a MRI was ordered and she does not want to wait for an appointment for a MRI while sitting around not being able to walk. Please call back

## 2016-10-02 ENCOUNTER — Other Ambulatory Visit: Payer: BLUE CROSS/BLUE SHIELD

## 2016-10-03 ENCOUNTER — Ambulatory Visit
Admission: RE | Admit: 2016-10-03 | Discharge: 2016-10-03 | Disposition: A | Discharge status: Home / Self Care | Payer: BLUE CROSS/BLUE SHIELD | Source: Ambulatory Visit | Attending: Family Medicine | Admitting: Family Medicine

## 2016-10-03 DIAGNOSIS — M48061 Spinal stenosis, lumbar region without neurogenic claudication: Secondary | ICD-10-CM | POA: Diagnosis not present

## 2016-10-03 DIAGNOSIS — M5126 Other intervertebral disc displacement, lumbar region: Secondary | ICD-10-CM | POA: Diagnosis not present

## 2016-10-03 DIAGNOSIS — M5416 Radiculopathy, lumbar region: Secondary | ICD-10-CM

## 2016-10-05 ENCOUNTER — Ambulatory Visit
Admission: RE | Admit: 2016-10-05 | Discharge: 2016-10-05 | Disposition: A | Discharge status: Home / Self Care | Payer: BLUE CROSS/BLUE SHIELD | Source: Ambulatory Visit | Attending: Family Medicine | Admitting: Family Medicine

## 2016-10-05 DIAGNOSIS — M5416 Radiculopathy, lumbar region: Secondary | ICD-10-CM

## 2016-10-05 DIAGNOSIS — M48061 Spinal stenosis, lumbar region without neurogenic claudication: Secondary | ICD-10-CM | POA: Diagnosis not present

## 2016-10-05 MED ORDER — METHYLPREDNISOLONE ACETATE 40 MG/ML INJ SUSP (RADIOLOG
120.0000 mg | Freq: Once | INTRAMUSCULAR | Status: AC
  Administered 2016-10-05: 120 mg via EPIDURAL

## 2016-10-05 MED ORDER — IOPAMIDOL (ISOVUE-M 200) INJECTION 41%
1.0000 mL | Freq: Once | INTRAMUSCULAR | Status: AC
  Administered 2016-10-05: 1 mL via EPIDURAL

## 2016-10-05 NOTE — Discharge Instructions (Signed)

## 2016-10-11 ENCOUNTER — Other Ambulatory Visit: Payer: BLUE CROSS/BLUE SHIELD

## 2016-10-17 ENCOUNTER — Ambulatory Visit (INDEPENDENT_AMBULATORY_CARE_PROVIDER_SITE_OTHER): Payer: BLUE CROSS/BLUE SHIELD | Admitting: Family Medicine

## 2016-10-17 ENCOUNTER — Encounter: Payer: Self-pay | Admitting: Family Medicine

## 2016-10-17 DIAGNOSIS — M48061 Spinal stenosis, lumbar region without neurogenic claudication: Secondary | ICD-10-CM | POA: Diagnosis not present

## 2016-10-17 MED ORDER — VITAMIN D (ERGOCALCIFEROL) 1.25 MG (50000 UNIT) PO CAPS: 50000.0000 [IU] | ORAL_CAPSULE | ORAL | 0 refills | Status: DC

## 2016-10-17 NOTE — Assessment & Plan Note (Signed)
Patient did disease to have some difficulty but has improved significantly with the epidural. Discussed with patient about different medications for any breakthrough pain. Encouraged gabapentin milligrams at night.

## 2016-10-17 NOTE — Progress Notes (Signed)
Tawana ScaleZach Laird Runnion D.O. [facility] [address] [place], [place] [address] Phone: [phone] Subjective:    I'm seeing this patient by the request  of:    CC: Bilateral back pain  BJY:NWGNFAOZHYHPI:Subjective  Samantha Cox is a 61 y.o. female coming in with complaint of bilateral back pain. Was having radicular symptoms. X-ray showed significant degenerative disc disease. Was sent for MRI. MRI showed severe spinal stenosis at L4-L5. Patient elected try an epidural injection. This was 2 weeks ago. Patient states significant improvement at this time. States that the pain is 80% better. Still having nighttime crampiness seems to be difficult. Feels like I was better when she was on the gabapentin. Starting have increasing foot pain again. Trying to increase activity. Laboratory workup did show a low vitamin D but otherwise unremarkable.     Past Medical History:  Diagnosis Date  . History of tobacco use   . Routine general medical examination at a health care facility   . Screening for lipoid disorders    Past Surgical History:  Procedure Laterality Date  . OTHER SURGICAL HISTORY     Birthmark removed from forehead 7th grade   Social History   Social History  . Marital status: Married    Spouse name: N/A  . Number of children: N/A  . Years of education: N/A   Social History Main Topics  . Smoking status: Former Games developermoker  . Smokeless tobacco: Never Used  . Alcohol use None  . Drug use: Unknown  . Sexual activity: Not Asked   Other Topics Concern  . None   Social History Narrative  . None   Allergies  Allergen Reactions  . Augmentin [Amoxicillin-Pot Clavulanate] Diarrhea    Felt very bad Felt very bad    Family History  Problem Relation Age of Onset  . Hypertension Mother   . Atrial fibrillation Father   . CVA Father [phone]  . Bladder Cancer Father   . Lung cancer Brother   . Diverticulitis Brother      Past medical history, social, surgical and family history all  reviewed in electronic medical record.  No pertanent information unless stated regarding to the chief complaint.   Review of Systems:Review of systems updated and as accurate as of 10/17/16  No headache, visual changes, nausea, vomiting, diarrhea, constipation, dizziness, abdominal pain, skin rash, fevers, chills, night sweats, weight loss, swollen lymph nodes, body aches, joint swelling,chest pain, shortness of breath, mood changes. + muscle aches.   Objective  Blood pressure 118/88, pulse 75, height 5' 6.5" (1.689 m), weight 176 lb (79.8 kg), SpO2 (!) 87 %. Systems examined below as of 10/17/16   General: No apparent distress alert and oriented x3 mood and affect normal, dressed appropriately.  HEENT: Pupils equal, extraocular movements intact  Respiratory: Patient's speak in full sentences and does not appear short of breath  Cardiovascular: No lower extremity edema, non tender, no erythema  Skin: Warm dry intact with no signs of infection or rash on extremities or on axial skeleton.  Abdomen: Soft nontender  Neuro: Cranial nerves II through XII are intact, neurovascularly intact in all extremities with 2+ DTRs and 2+ pulses.  Lymph: No lymphadenopathy of posterior or anterior cervical chain or axillae bilaterally.  Gait normal with good balance and coordination.  MSK:  Non tender with full range of motion and good stability and symmetric strength and tone of shoulders, elbows, wrist, hip, knee and ankles bilaterally.  Back Exam:  Inspection: Loss of  lordosis Motion: Flexion 30 deg, Extension 20 deg, Side Bending to 35 deg bilaterally,  Rotation to 45 deg bilaterally  SLR laying: Negative  XSLR laying: Negative  Palpable tenderness: Mild discomfort.Marland Kitchen FABER: negative. Sensory change: Gross sensation intact to all lumbar and sacral dermatomes.  Reflexes: 2+ at both patellar tendons, 2+ at achilles tendons, Babinski's downgoing.  Strength at foot  Plantar-flexion: 5/5 Dorsi-flexion: 5/5  Eversion: 5/5 Inversion: 5/5  Leg strength  Quad: 5/5 Hamstring: 5/5 Hip flexor: 5/5 Hip abductors: 4/5 but symmetric Gait unremarkable.    Impression and Recommendations:     This case required medical decision making of moderate complexity.      Note: This dictation was prepared with Dragon dictation along with smaller phrase technology. Any transcriptional errors that result from this process are unintentional.

## 2016-10-17 NOTE — Patient Instructions (Addendum)
Good to see you  Gustavus Bryantce is your friend.  Gabapentin 100mg  at night Once weekly vitamin D for 12 weeks.  Continue to be active Can repeat epidural if needed.  See me again in 6 weeks but send a message in 2 weeks to tell me how you are doing.

## 2016-11-29 ENCOUNTER — Other Ambulatory Visit (HOSPITAL_COMMUNITY)
Admission: RE | Admit: 2016-11-29 | Discharge: 2016-11-29 | Disposition: A | Discharge status: Home / Self Care | Payer: BLUE CROSS/BLUE SHIELD | Source: Ambulatory Visit | Attending: Family Medicine | Admitting: Family Medicine

## 2016-11-29 ENCOUNTER — Other Ambulatory Visit: Payer: Self-pay | Admitting: Family Medicine

## 2016-11-29 DIAGNOSIS — Z124 Encounter for screening for malignant neoplasm of cervix: Secondary | ICD-10-CM | POA: Insufficient documentation

## 2016-11-29 DIAGNOSIS — Z23 Encounter for immunization: Secondary | ICD-10-CM | POA: Diagnosis not present

## 2016-11-29 DIAGNOSIS — R21 Rash and other nonspecific skin eruption: Secondary | ICD-10-CM | POA: Diagnosis not present

## 2016-11-29 DIAGNOSIS — H8110 Benign paroxysmal vertigo, unspecified ear: Secondary | ICD-10-CM | POA: Diagnosis not present

## 2016-11-29 DIAGNOSIS — Z13220 Encounter for screening for lipoid disorders: Secondary | ICD-10-CM | POA: Diagnosis not present

## 2016-11-29 DIAGNOSIS — Z0000 Encounter for general adult medical examination without abnormal findings: Secondary | ICD-10-CM | POA: Diagnosis not present

## 2016-12-02 ENCOUNTER — Encounter: Payer: Self-pay | Admitting: Family Medicine

## 2016-12-02 ENCOUNTER — Ambulatory Visit: Payer: Self-pay

## 2016-12-02 ENCOUNTER — Ambulatory Visit (INDEPENDENT_AMBULATORY_CARE_PROVIDER_SITE_OTHER): Payer: BLUE CROSS/BLUE SHIELD | Admitting: Family Medicine

## 2016-12-02 VITALS — BP 108/76 | HR 84 | Ht 66.0 in | Wt 176.0 lb

## 2016-12-02 DIAGNOSIS — M79672 Pain in left foot: Secondary | ICD-10-CM

## 2016-12-02 DIAGNOSIS — G5702 Lesion of sciatic nerve, left lower limb: Secondary | ICD-10-CM | POA: Diagnosis not present

## 2016-12-02 DIAGNOSIS — G5762 Lesion of plantar nerve, left lower limb: Secondary | ICD-10-CM

## 2016-12-02 NOTE — Assessment & Plan Note (Signed)
Patient was given an injection today. Patient was adamant he felt like this would be beneficial. Hopefully this will continue to be good. We discussed icing regimen and home exercises. Discussed the importance of hip abductor strengthening. If no significant improvement I do believe that radicular symptoms from the spinal stenosis could be contribute in. Patient come back again in 4 weeks

## 2016-12-02 NOTE — Progress Notes (Signed)
[name] [address] [place], [place] [address] Phone: [phone] Subjective:    I'm seeing this patient by the request  of:    CC: Left piriformis, left foot pain follow-up  MVH:[id number]  Samantha Cox is a 61 y.o. female coming in for follow up for back pain. She is having left hip pain but her largest complaint is the neuroma on the left foot.   Patient has had injections in the neuroma previously. Having worsening symptoms again. States that even walking greater than 200 feet causes the discomfort. Has of throbbing aching pain even at night.  Patient is also complaining of the left piriformis. Patient does have severe spinal stenosis and did respond fairly well to the epidural. Feels like the patient is having more of the piriformis pain than truly the back pain at this time.     Past Medical History:  Diagnosis Date  . History of tobacco use   . Routine general medical examination at a health care facility   . Screening for lipoid disorders    Past Surgical History:  Procedure Laterality Date  . OTHER SURGICAL HISTORY     Birthmark removed from forehead 7th grade   Social History   Social History  . Marital status: Married    Spouse name: N/A  . Number of children: N/A  . Years of education: N/A   Social History Main Topics  . Smoking status: Former Games developer  . Smokeless tobacco: Never Used  . Alcohol use None  . Drug use: Unknown  . Sexual activity: Not Asked   Other Topics Concern  . None   Social History Narrative  . None   Allergies  Allergen Reactions  . Augmentin [Amoxicillin-Pot Clavulanate] Diarrhea    Felt very bad Felt very bad    Family History  Problem Relation Age of Onset  . Hypertension Mother   . Atrial fibrillation Father   . CVA Father 36  . Bladder Cancer Father   . Lung cancer Brother   . Diverticulitis Brother      Past medical history, social, surgical and family history all reviewed  in electronic medical record.  No pertanent information unless stated regarding to the chief complaint.   Review of Systems:Review of systems updated and as accurate as of 12/02/16  No headache, visual changes, nausea, vomiting, diarrhea, constipation, dizziness, abdominal pain, skin rash, fevers, chills, night sweats, weight loss, swollen lymph nodes, body aches, joint swelling,  chest pain, shortness of breath, mood changes. Positive muscle aches  Objective  Blood pressure 108/76, pulse 84, height 5\' 6"  (1.676 m), weight 176 lb (79.8 kg), SpO2 95 %. Systems examined below as of 12/02/16   General: No apparent distress alert and oriented x3 mood and affect normal, dressed appropriately.  HEENT: Pupils equal, extraocular movements intact  Respiratory: Patient's speak in full sentences and does not appear short of breath  Cardiovascular: No lower extremity edema, non tender, no erythema  Skin: Warm dry intact with no signs of infection or rash on extremities or on axial skeleton.  Abdomen: Soft nontender  Neuro: Cranial nerves II through XII are intact, neurovascularly intact in all extremities with 2+ DTRs and 2+ pulses.  Lymph: No lymphadenopathy of posterior or anterior cervical chain or axillae bilaterally.  Gait normal with good balance and coordination.  MSK:  Non tender with full range of motion and good stability and symmetric strength and tone of shoulders, elbows, wrist, hip, knee and  ankles bilaterally. Mild arthritic changes of multiple joints Back exam shows the patient does have some mild limitation with extension of the back by 10. Patient has a positive Pearlean BrownieFaber test on the left side. Severe pain over the piriformis and mild over the lateral greater trochanteric area. No pain with internal rotation of the hip. Name of the left foot shows breakdown of the transverse arch with positive squeeze test. Pain seemed to be between the third and fourth toes more.  Procedure: Real-time  Ultrasound Guided Injection of left piriformis Device: GE Logiq Q7 Ultrasound guided injection is preferred based studies that show increased duration, increased effect, greater accuracy, decreased procedural pain, increased response rate, and decreased cost with ultrasound guided versus blind injection.  Verbal informed consent obtained.  Time-out conducted.  Noted no overlying erythema, induration, or other signs of local infection.  Skin prepped in a sterile fashion.  Local anesthesia: Topical Ethyl chloride.  With sterile technique and under real time ultrasound guidance:  1 mL of 0.5% Marcaine and 1 mL of Kenalog 40 g/dL within the left piriformis tendon sheath Completed without difficulty  Pain immediately resolved suggesting accurate placement of the medication.  Advised to call if fevers/chills, erythema, induration, drainage, or persistent bleeding.  Images permanently stored and available for review in the ultrasound unit.  Impression: Technically successful ultrasound guided injection.  Procedure: Real-time Ultrasound Guided Injection of left foot neuroma Device: GE Logiq Q7 Ultrasound guided injection is preferred based studies that show increased duration, increased effect, greater accuracy, decreased procedural pain, increased response rate, and decreased cost with ultrasound guided versus blind injection.  Verbal informed consent obtained.  Time-out conducted.  Noted no overlying erythema, induration, or other signs of local infection.  Skin prepped in a sterile fashion.  Local anesthesia: Topical Ethyl chloride.  With sterile technique and under real time ultrasound guidance:  Injected with a 25-gauge half-inch needle 0.5 mL of 0.5% Marcaine and 0.5 mL of Kenalog 40 mg/mL Completed without difficulty  Pain immediately resolved suggesting accurate placement of the medication.  Advised to call if fevers/chills, erythema, induration, drainage, or persistent bleeding.  Images  permanently stored and available for review in the ultrasound unit.  Impression: Technically successful ultrasound guided injection.   Impression and Recommendations:     This case required medical decision making of moderate complexity.      Note: This dictation was prepared with Dragon dictation along with smaller phrase technology. Any transcriptional errors that result from this process are unintentional.        500lb

## 2016-12-02 NOTE — Patient Instructions (Signed)
Good to see you  Injected the piriformis and the foot again.  I hope it calms it down well.  If not a lot better in 10-14 days write us and we will order another epidural  If we order the epidural then I would like to see you again 2-3 weeks after it.  Otherwise see me again in 6 weeks.

## 2016-12-02 NOTE — Assessment & Plan Note (Signed)
Repeat injection given today. Discussed icing regimen and home exercise. Discussed which activities to do a which ones to avoid. Patient will increase activity slowly over the course of next several days. We discussed icing regimen. Discussed which activities to do in which ones to avoid. Patient will come back and see me again in 4 weeks.

## 2016-12-06 LAB — CYTOLOGY - PAP: HPV: DETECTED — AB

## 2016-12-17 DIAGNOSIS — R110 Nausea: Secondary | ICD-10-CM | POA: Diagnosis not present

## 2016-12-17 DIAGNOSIS — R42 Dizziness and giddiness: Secondary | ICD-10-CM | POA: Diagnosis not present

## 2017-01-04 ENCOUNTER — Ambulatory Visit: Payer: BLUE CROSS/BLUE SHIELD | Admitting: Family Medicine

## 2017-01-06 ENCOUNTER — Encounter: Payer: Self-pay | Admitting: Family Medicine

## 2017-01-06 DIAGNOSIS — H9193 Unspecified hearing loss, bilateral: Secondary | ICD-10-CM | POA: Insufficient documentation

## 2017-01-06 DIAGNOSIS — J310 Chronic rhinitis: Secondary | ICD-10-CM | POA: Insufficient documentation

## 2017-01-06 DIAGNOSIS — H9313 Tinnitus, bilateral: Secondary | ICD-10-CM | POA: Insufficient documentation

## 2017-01-06 DIAGNOSIS — H8110 Benign paroxysmal vertigo, unspecified ear: Secondary | ICD-10-CM | POA: Insufficient documentation

## 2017-01-06 DIAGNOSIS — R42 Dizziness and giddiness: Secondary | ICD-10-CM | POA: Diagnosis not present

## 2017-01-16 ENCOUNTER — Ambulatory Visit: Payer: BLUE CROSS/BLUE SHIELD | Admitting: Family Medicine

## 2017-01-17 ENCOUNTER — Ambulatory Visit: Payer: BLUE CROSS/BLUE SHIELD | Admitting: Family Medicine

## 2017-01-23 NOTE — Progress Notes (Signed)
Tawana ScaleZach Rubina Basinski D.O. [facility] [address] [place], [place] [address] Phone: [phone] Subjective:    I'm seeing this patient by the request  of:    CC: Back pain follow-up  OZH:[id number]:Subjective  Samantha Cox is a 61 y.o. female coming in with complaint of back pain.  Found to have severe spinal stenosis especially at L4-L5.  Patient was doing significantly better with epidural injection.  Patient also did have what appeared to be more of a slipped rib syndrome.  Given home exercises and started with osteopathic manipulation.  Patient also given a piriformis injection back on December 02, 2016 and was improving somewhat.  Patient no foot pain seems to be more of the neuromas again.  Patient states that her feet are no better after the injection. She continues to have constant pain in her feet.   Her back pain also continues. She has pain in the morning., She takes Advil and is fine for the remainder of the day. Pain radiates down the back of each thigh down to the posterior aspest of her knees. Back seems to be worse, affecting job and daily activities.  Can keep her up at night.      Past Medical History:  Diagnosis Date  . History of tobacco use   . Routine general medical examination at a health care facility   . Screening for lipoid disorders    Past Surgical History:  Procedure Laterality Date  . OTHER SURGICAL HISTORY     Birthmark removed from forehead 7th grade   Social History   Socioeconomic History  . Marital status: Married    Spouse name: None  . Number of children: None  . Years of education: None  . Highest education level: None  Social Needs  . Financial resource strain: None  . Food insecurity - worry: None  . Food insecurity - inability: None  . Transportation needs - medical: None  . Transportation needs - non-medical: None  Occupational History  . None  Tobacco Use  . Smoking status: Former Games developermoker  . Smokeless tobacco: Never Used    Substance and Sexual Activity  . Alcohol use: None  . Drug use: None  . Sexual activity: None  Other Topics Concern  . None  Social History Narrative  . None   Allergies  Allergen Reactions  . Augmentin [Amoxicillin-Pot Clavulanate] Diarrhea    Felt very bad Felt very bad    Family History  Problem Relation Age of Onset  . Hypertension Mother   . Atrial fibrillation Father   . CVA Father [phone]  . Bladder Cancer Father   . Lung cancer Brother   . Diverticulitis Brother      Past medical history, social, surgical and family history all reviewed in electronic medical record.  No pertanent information unless stated regarding to the chief complaint.   Review of Systems:Review of systems updated and as accurate as of 01/24/17  No headache, visual changes, nausea, vomiting, diarrhea, constipation, dizziness, abdominal pain, skin rash, fevers, chills, night sweats, weight loss, swollen lymph nodes, body aches, joint swelling,  chest pain, shortness of breath, mood changes. +muscle aches.   Objective  Blood pressure 120/84, pulse 68, height 5\' 6"  (1.676 m), weight 177 lb (80.3 kg), SpO2 98 %. Systems examined below as of 01/24/17   General: No apparent distress alert and oriented x3 mood and affect normal, dressed appropriately.  HEENT: Pupils equal, extraocular movements intact  Respiratory: Patient's speak in full sentences  and does not appear short of breath  Cardiovascular: No lower extremity edema, non tender, no erythema  Skin: Warm dry intact with no signs of infection or rash on extremities or on axial skeleton.  Abdomen: Soft nontender  Neuro: Cranial nerves II through XII are intact, neurovascularly intact in all extremities with 2+ DTRs and 2+ pulses.  Lymph: No lymphadenopathy of posterior or anterior cervical chain or axillae bilaterally.  Gait normal with good balance and coordination.  MSK:  Non tender with full range of motion and good stability and symmetric  strength and tone of shoulders, elbows, wrist, hip, knee and ankles bilaterally.  Back Exam:  Inspection: loss of range of motion.  Motion: Flexion 25 deg, Extension 25 deg, Side Bending to 30 deg bilaterally,  Rotation to 35 deg bilaterally  SLR laying: Positive straight leg test XSLR laying: Negative  Palpable tenderness: Tender to palpation of the paraspinal musculature lumbar spine. FABER: Faber test positive bilaterally. Sensory change: Gross sensation intact to all lumbar and sacral dermatomes.  Reflexes: 2+ at both patellar tendons, 2+ at achilles tendons, Babinski's downgoing.  Strength at foot  4/5 and symmetric     Impression and Recommendations:     This case required medical decision making of moderate complexity.      Note: This dictation was prepared with Dragon dictation along with smaller phrase technology. Any transcriptional errors that result from this process are unintentional.

## 2017-01-24 ENCOUNTER — Encounter: Payer: Self-pay | Admitting: Family Medicine

## 2017-01-24 ENCOUNTER — Ambulatory Visit (INDEPENDENT_AMBULATORY_CARE_PROVIDER_SITE_OTHER): Payer: BLUE CROSS/BLUE SHIELD | Admitting: Family Medicine

## 2017-01-24 VITALS — BP 120/84 | HR 68 | Ht 66.0 in | Wt 177.0 lb

## 2017-01-24 DIAGNOSIS — M5416 Radiculopathy, lumbar region: Secondary | ICD-10-CM | POA: Diagnosis not present

## 2017-01-24 DIAGNOSIS — M48061 Spinal stenosis, lumbar region without neurogenic claudication: Secondary | ICD-10-CM | POA: Diagnosis not present

## 2017-01-24 MED ORDER — MELOXICAM 15 MG PO TABS
15.0000 mg | ORAL_TABLET | Freq: Every day | ORAL | 2 refills | Status: DC
Start: 2017-01-24 — End: 2018-10-19

## 2017-01-24 NOTE — Patient Instructions (Signed)
Great to see you  We will order another epidural  Start the meloxicam but take daily for 10 days then as needed.  COnsider a low dose gabapentin at night Write me in 2 weeks and tell me how the foot is, if worse we will send you to someone else.  Otherwise see me again 3 weeks after the epidural  [phone]

## 2017-01-24 NOTE — Assessment & Plan Note (Signed)
Worsening symptoms again.  Repeat epidural.  We discussed different medications and please see patient instructions.  Discussed icing regimen, home exercises.  I believe the patient's pain in the foot is more secondary to the back.  We discussed the possibility of an EMG which patient declined.  We also discussed the possibility of referral to a podiatrist which also declined.  Follow-up again in 2-3 weeks after the epidural.

## 2017-02-06 ENCOUNTER — Ambulatory Visit: Payer: BLUE CROSS/BLUE SHIELD | Admitting: Family Medicine

## 2017-02-08 ENCOUNTER — Ambulatory Visit
Admission: RE | Admit: 2017-02-08 | Discharge: 2017-02-08 | Disposition: A | Discharge status: Home / Self Care | Payer: BLUE CROSS/BLUE SHIELD | Source: Ambulatory Visit | Attending: Family Medicine | Admitting: Family Medicine

## 2017-02-08 DIAGNOSIS — M5126 Other intervertebral disc displacement, lumbar region: Secondary | ICD-10-CM | POA: Diagnosis not present

## 2017-02-08 DIAGNOSIS — M5416 Radiculopathy, lumbar region: Secondary | ICD-10-CM

## 2017-02-08 MED ORDER — IOPAMIDOL (ISOVUE-M 200) INJECTION 41%
1.0000 mL | Freq: Once | INTRAMUSCULAR | Status: AC
  Administered 2017-02-08: 1 mL via EPIDURAL

## 2017-02-08 MED ORDER — METHYLPREDNISOLONE ACETATE 40 MG/ML INJ SUSP (RADIOLOG
120.0000 mg | Freq: Once | INTRAMUSCULAR | Status: AC
  Administered 2017-02-08: 120 mg via EPIDURAL

## 2017-02-08 NOTE — Discharge Instructions (Signed)

## 2017-02-13 DIAGNOSIS — Z1211 Encounter for screening for malignant neoplasm of colon: Secondary | ICD-10-CM | POA: Diagnosis not present

## 2017-02-13 DIAGNOSIS — R159 Full incontinence of feces: Secondary | ICD-10-CM | POA: Diagnosis not present

## 2017-02-15 ENCOUNTER — Encounter: Payer: Self-pay | Admitting: Family Medicine

## 2017-02-17 DIAGNOSIS — A630 Anogenital (venereal) warts: Secondary | ICD-10-CM | POA: Diagnosis not present

## 2017-02-17 DIAGNOSIS — R87612 Low grade squamous intraepithelial lesion on cytologic smear of cervix (LGSIL): Secondary | ICD-10-CM | POA: Diagnosis not present

## 2017-02-23 DIAGNOSIS — H5203 Hypermetropia, bilateral: Secondary | ICD-10-CM | POA: Diagnosis not present

## 2017-02-23 DIAGNOSIS — H25013 Cortical age-related cataract, bilateral: Secondary | ICD-10-CM | POA: Diagnosis not present

## 2017-02-23 DIAGNOSIS — H524 Presbyopia: Secondary | ICD-10-CM | POA: Diagnosis not present

## 2017-03-14 ENCOUNTER — Encounter: Payer: Self-pay | Admitting: Family Medicine

## 2017-03-14 DIAGNOSIS — M5416 Radiculopathy, lumbar region: Secondary | ICD-10-CM

## 2017-03-27 ENCOUNTER — Telehealth: Payer: Self-pay | Admitting: Family Medicine

## 2017-03-27 MED ORDER — GABAPENTIN 100 MG PO CAPS: 200.0000 mg | ORAL_CAPSULE | Freq: Every day | ORAL | 1 refills | Status: DC

## 2017-03-27 NOTE — Telephone Encounter (Signed)
Copied from CRM [id number]. Topic: General - Other >> Mar 27, 2017 11:41 AM Cecelia ByarsGreen, Alarik Radu L, RMA wrote: Reason for CRM: Medication refill request for gabapentin 100 mg [facility]

## 2017-03-27 NOTE — Telephone Encounter (Signed)
Refill sent to pharmacy.   

## 2017-04-10 ENCOUNTER — Ambulatory Visit
Admission: RE | Admit: 2017-04-10 | Discharge: 2017-04-10 | Disposition: A | Discharge status: Home / Self Care | Payer: BLUE CROSS/BLUE SHIELD | Source: Ambulatory Visit | Attending: Family Medicine | Admitting: Family Medicine

## 2017-04-10 DIAGNOSIS — M5416 Radiculopathy, lumbar region: Secondary | ICD-10-CM

## 2017-04-10 DIAGNOSIS — M48061 Spinal stenosis, lumbar region without neurogenic claudication: Secondary | ICD-10-CM | POA: Diagnosis not present

## 2017-04-10 MED ORDER — IOPAMIDOL (ISOVUE-M 200) INJECTION 41%
1.0000 mL | Freq: Once | INTRAMUSCULAR | Status: AC
  Administered 2017-04-10: 1 mL via EPIDURAL

## 2017-04-10 MED ORDER — METHYLPREDNISOLONE ACETATE 40 MG/ML INJ SUSP (RADIOLOG
120.0000 mg | Freq: Once | INTRAMUSCULAR | Status: AC
  Administered 2017-04-10: 120 mg via EPIDURAL

## 2017-04-10 NOTE — Discharge Instructions (Signed)

## 2017-04-17 ENCOUNTER — Other Ambulatory Visit: Payer: Self-pay | Admitting: Family Medicine

## 2017-04-17 DIAGNOSIS — Z1231 Encounter for screening mammogram for malignant neoplasm of breast: Secondary | ICD-10-CM

## 2017-04-18 DIAGNOSIS — Z1211 Encounter for screening for malignant neoplasm of colon: Secondary | ICD-10-CM | POA: Diagnosis not present

## 2017-04-18 DIAGNOSIS — K6389 Other specified diseases of intestine: Secondary | ICD-10-CM | POA: Diagnosis not present

## 2017-04-18 DIAGNOSIS — K5730 Diverticulosis of large intestine without perforation or abscess without bleeding: Secondary | ICD-10-CM | POA: Diagnosis not present

## 2017-04-18 DIAGNOSIS — K635 Polyp of colon: Secondary | ICD-10-CM | POA: Diagnosis not present

## 2017-04-18 DIAGNOSIS — D124 Benign neoplasm of descending colon: Secondary | ICD-10-CM | POA: Diagnosis not present

## 2017-04-18 DIAGNOSIS — D122 Benign neoplasm of ascending colon: Secondary | ICD-10-CM | POA: Diagnosis not present

## 2017-04-29 NOTE — Progress Notes (Signed)
[name] [address] [place], [place] [address] Phone: [phone] Subjective:     CC: Back pain follow-up  DGL:[id number]  Samantha Cox is a 62 y.o. female coming in with complaint of back pain.  Found to have spinal stenosis severe at L4-L5.  Responded well to an epidural in January 2 it was 90% better.  Patient started having increasing discomfort and had a second epidural April 10, 2017.  Patient states overall it is more of a door.  Nothing that seems to be severe.  Denies any significant radiation of pain.  Not taking gabapentin regularly.  No numbness or tingling.  States that still some discomfort and is still aware of it.    Past Medical History:  Diagnosis Date  . History of tobacco use   . Routine general medical examination at a health care facility   . Screening for lipoid disorders    Past Surgical History:  Procedure Laterality Date  . OTHER SURGICAL HISTORY     Birthmark removed from forehead 7th grade   Social History   Socioeconomic History  . Marital status: Married    Spouse name: Not on file  . Number of children: Not on file  . Years of education: Not on file  . Highest education level: Not on file  Occupational History  . Not on file  Social Needs  . Financial resource strain: Not on file  . Food insecurity:    Worry: Not on file    Inability: Not on file  . Transportation needs:    Medical: Not on file    Non-medical: Not on file  Tobacco Use  . Smoking status: Former Games developer  . Smokeless tobacco: Never Used  Substance and Sexual Activity  . Alcohol use: Not on file  . Drug use: Not on file  . Sexual activity: Not on file  Lifestyle  . Physical activity:    Days per week: Not on file    Minutes per session: Not on file  . Stress: Not on file  Relationships  . Social connections:    Talks on phone: Not on file    Gets together: Not on file    Attends religious service: Not on file    Active member  of club or organization: Not on file    Attends meetings of clubs or organizations: Not on file    Relationship status: Not on file  Other Topics Concern  . Not on file  Social History Narrative  . Not on file   Allergies  Allergen Reactions  . Augmentin [Amoxicillin-Pot Clavulanate] Diarrhea and Other (See Comments)    Felt very bad     Family History  Problem Relation Age of Onset  . Hypertension Mother   . Atrial fibrillation Father   . CVA Father 26  . Bladder Cancer Father   . Lung cancer Brother   . Diverticulitis Brother      Past medical history, social, surgical and family history all reviewed in electronic medical record.  No pertanent information unless stated regarding to the chief complaint.   Review of Systems:Review of systems updated and as accurate as of 05/01/17  No headache, visual changes, nausea, vomiting, diarrhea, constipation, dizziness, abdominal pain, skin rash, fevers, chills, night sweats, weight loss, swollen lymph nodes, body aches, joint swelling, muscle aches, chest pain, shortness of breath, mood changes.   Objective  Blood pressure 120/70, pulse 79, height 5\' 6"  (1.676 m), weight 174  lb (78.9 kg), SpO2 96 %. Systems examined below as of 05/01/17   General: No apparent distress alert and oriented x3 mood and affect normal, dressed appropriately.  HEENT: Pupils equal, extraocular movements intact  Respiratory: Patient's speak in full sentences and does not appear short of breath  Cardiovascular: No lower extremity edema, non tender, no erythema  Skin: Warm dry intact with no signs of infection or rash on extremities or on axial skeleton.  Abdomen: Soft nontender  Neuro: Cranial nerves II through XII are intact, neurovascularly intact in all extremities with 2+ DTRs and 2+ pulses.  Lymph: No lymphadenopathy of posterior or anterior cervical chain or axillae bilaterally.  Gait normal with good balance and coordination.  MSK:  Non tender with  full range of motion and good stability and symmetric strength and tone of shoulders, elbows, wrist, hip, knee and ankles bilaterally.  Back Exam:  Inspection: Loss of lordosis Motion: Flexion 45 deg, Extension 25 deg, Side Bending to 45 deg bilaterally, Rotation to 35 deg bilaterally  SLR laying: Negative  XSLR laying: Negative  Palpable tenderness: Mild tenderness to palpation the paraspinal musculature of lumbar spine. FABER: Tightness bilaterally. Sensory change: Gross sensation intact to all lumbar and sacral dermatomes.  Reflexes: 2+ at both patellar tendons, 2+ at achilles tendons, Babinski's downgoing.  Strength at foot  Plantar-flexion: 5/5 Dorsi-flexion: 5/5 Eversion: 5/5 Inversion: 5/5  Leg strength  Quad: 5/5 Hamstring: 5/5 Hip flexor: 5/5 Hip abductors: 5/5  Gait unremarkable.   Impression and Recommendations:     This case required medical decision making of moderate complexity.      Note: This dictation was prepared with Dragon dictation along with smaller phrase technology. Any transcriptional errors that result from this process are unintentional.

## 2017-05-01 ENCOUNTER — Ambulatory Visit (INDEPENDENT_AMBULATORY_CARE_PROVIDER_SITE_OTHER): Payer: BLUE CROSS/BLUE SHIELD | Admitting: Family Medicine

## 2017-05-01 ENCOUNTER — Encounter: Payer: Self-pay | Admitting: Family Medicine

## 2017-05-01 DIAGNOSIS — M48061 Spinal stenosis, lumbar region without neurogenic claudication: Secondary | ICD-10-CM | POA: Diagnosis not present

## 2017-05-01 NOTE — Assessment & Plan Note (Signed)
Doing well with conservative therapy.  Discussed icing regimen and home exercises.  Discussed which activities to do which wants to avoid.  Increase activity as tolerated.  Follow-up as needed

## 2017-05-01 NOTE — Patient Instructions (Signed)
Good to see you  We can repeat the injection when we need I like your plan with the exercises Stay active Gabapentin at night when needed If need meloxicam take it daily for 5-7 days no matter what  Call [phone] when you need another injection  Otherwise see me when you need me

## 2017-05-09 DIAGNOSIS — R1031 Right lower quadrant pain: Secondary | ICD-10-CM | POA: Diagnosis not present

## 2017-05-10 ENCOUNTER — Ambulatory Visit
Admission: RE | Admit: 2017-05-10 | Discharge: 2017-05-10 | Disposition: A | Discharge status: Home / Self Care | Payer: BLUE CROSS/BLUE SHIELD | Source: Ambulatory Visit | Attending: Family Medicine | Admitting: Family Medicine

## 2017-05-10 ENCOUNTER — Other Ambulatory Visit: Payer: Self-pay | Admitting: Family Medicine

## 2017-05-10 DIAGNOSIS — R1031 Right lower quadrant pain: Secondary | ICD-10-CM

## 2017-05-10 DIAGNOSIS — Z1231 Encounter for screening mammogram for malignant neoplasm of breast: Secondary | ICD-10-CM | POA: Diagnosis not present

## 2017-05-10 DIAGNOSIS — R102 Pelvic and perineal pain: Secondary | ICD-10-CM

## 2017-05-12 ENCOUNTER — Ambulatory Visit
Admission: RE | Admit: 2017-05-12 | Discharge: 2017-05-12 | Disposition: A | Discharge status: Home / Self Care | Payer: BLUE CROSS/BLUE SHIELD | Source: Ambulatory Visit | Attending: Family Medicine | Admitting: Family Medicine

## 2017-05-12 DIAGNOSIS — R102 Pelvic and perineal pain: Secondary | ICD-10-CM

## 2017-05-12 DIAGNOSIS — R1031 Right lower quadrant pain: Secondary | ICD-10-CM | POA: Diagnosis not present

## 2017-05-21 ENCOUNTER — Emergency Department (HOSPITAL_COMMUNITY): Payer: BLUE CROSS/BLUE SHIELD

## 2017-05-21 ENCOUNTER — Encounter (HOSPITAL_COMMUNITY): Payer: Self-pay | Admitting: Emergency Medicine

## 2017-05-21 ENCOUNTER — Emergency Department (HOSPITAL_COMMUNITY)
Admission: EM | Admit: 2017-05-21 | Discharge: 2017-05-21 | Disposition: A | Discharge status: Home / Self Care | Payer: BLUE CROSS/BLUE SHIELD | Attending: Emergency Medicine | Admitting: Emergency Medicine

## 2017-05-21 DIAGNOSIS — Y9289 Other specified places as the place of occurrence of the external cause: Secondary | ICD-10-CM | POA: Insufficient documentation

## 2017-05-21 DIAGNOSIS — R109 Unspecified abdominal pain: Secondary | ICD-10-CM | POA: Insufficient documentation

## 2017-05-21 DIAGNOSIS — Y9389 Activity, other specified: Secondary | ICD-10-CM | POA: Insufficient documentation

## 2017-05-21 DIAGNOSIS — M25551 Pain in right hip: Secondary | ICD-10-CM | POA: Insufficient documentation

## 2017-05-21 DIAGNOSIS — Z87891 Personal history of nicotine dependence: Secondary | ICD-10-CM | POA: Diagnosis not present

## 2017-05-21 DIAGNOSIS — S3991XA Unspecified injury of abdomen, initial encounter: Secondary | ICD-10-CM | POA: Diagnosis not present

## 2017-05-21 DIAGNOSIS — S3993XA Unspecified injury of pelvis, initial encounter: Secondary | ICD-10-CM | POA: Diagnosis not present

## 2017-05-21 DIAGNOSIS — Y998 Other external cause status: Secondary | ICD-10-CM | POA: Insufficient documentation

## 2017-05-21 DIAGNOSIS — R1031 Right lower quadrant pain: Secondary | ICD-10-CM | POA: Diagnosis not present

## 2017-05-21 DIAGNOSIS — Z79899 Other long term (current) drug therapy: Secondary | ICD-10-CM | POA: Diagnosis not present

## 2017-05-21 LAB — COMPREHENSIVE METABOLIC PANEL
ALT: 31 U/L (ref 14–54)
AST: 32 U/L (ref 15–41)
Albumin: 4.2 g/dL (ref 3.5–5.0)
Alkaline Phosphatase: 93 U/L (ref 38–126)
Anion gap: 11 (ref 5–15)
BUN: 13 mg/dL (ref 6–20)
CO2: 22 mmol/L (ref 22–32)
Calcium: 9.6 mg/dL (ref 8.9–10.3)
Chloride: 107 mmol/L (ref 101–111)
Creatinine, Ser: 0.8 mg/dL (ref 0.44–1.00)
GFR calc Af Amer: >60 mL/min (ref >60)
GFR calc non Af Amer: >60 mL/min (ref >60)
Glucose, Bld: 124 mg/dL — ABNORMAL HIGH (ref 65–99)
Potassium: 3.5 mmol/L (ref 3.5–5.1)
Sodium: 140 mmol/L (ref 135–145)
Total Bilirubin: 1 mg/dL (ref 0.3–1.2)
Total Protein: 7.3 g/dL (ref 6.5–8.1)

## 2017-05-21 LAB — CBC WITH DIFFERENTIAL/PLATELET
Basophils Absolute: 0 10*3/uL (ref 0.0–0.1)
Basophils Relative: 0 %
Eosinophils Absolute: 0 10*3/uL (ref 0.0–0.7)
Eosinophils Relative: 0 %
HCT: 44.8 % (ref 36.0–46.0)
Hemoglobin: 15.2 g/dL — ABNORMAL HIGH (ref 12.0–15.0)
Lymphocytes Relative: 16 %
Lymphs Abs: 2.1 10*3/uL (ref 0.7–4.0)
MCH: 32.4 pg (ref 26.0–34.0)
MCHC: 33.9 g/dL (ref 30.0–36.0)
MCV: 95.5 fL (ref 78.0–100.0)
Monocytes Absolute: 0.7 10*3/uL (ref 0.1–1.0)
Monocytes Relative: 5 %
Neutro Abs: 10 10*3/uL — ABNORMAL HIGH (ref 1.7–7.7)
Neutrophils Relative %: 79 %
Platelets: 185 10*3/uL (ref 150–400)
RBC: 4.69 MIL/uL (ref 3.87–5.11)
RDW: 12.6 % (ref 11.5–15.5)
WBC: 12.8 10*3/uL — ABNORMAL HIGH (ref 4.0–10.5)

## 2017-05-21 MED ORDER — HYDROCODONE-ACETAMINOPHEN 5-325 MG PO TABS
2.0000 | ORAL_TABLET | Freq: Once | ORAL | Status: DC | PRN
Start: 2017-05-21 — End: 2017-05-22

## 2017-05-21 MED ORDER — HYDROCODONE-ACETAMINOPHEN 5-325 MG PO TABS: 2.0000 | ORAL_TABLET | ORAL | 0 refills | Status: DC | PRN

## 2017-05-21 MED ORDER — CYCLOBENZAPRINE HCL 10 MG PO TABS
10.0000 mg | ORAL_TABLET | Freq: Once | ORAL | Status: AC
  Administered 2017-05-21: 10 mg via ORAL
  Filled 2017-05-21: qty 1

## 2017-05-21 MED ORDER — IBUPROFEN 400 MG PO TABS
600.0000 mg | ORAL_TABLET | Freq: Once | ORAL | Status: AC
Start: 2017-05-21 — End: 2017-05-21
  Administered 2017-05-21: 600 mg via ORAL
  Filled 2017-05-21: qty 1

## 2017-05-21 MED ORDER — IOPAMIDOL (ISOVUE-300) INJECTION 61%
INTRAVENOUS | Status: AC
  Filled 2017-05-21: qty 100

## 2017-05-21 MED ORDER — IOPAMIDOL (ISOVUE-300) INJECTION 61%
100.0000 mL | Freq: Once | INTRAVENOUS | Status: AC | PRN
  Administered 2017-05-21: 100 mL via INTRAVENOUS

## 2017-05-21 MED ORDER — CYCLOBENZAPRINE HCL 5 MG PO TABS: 5.0000 mg | ORAL_TABLET | Freq: Three times a day (TID) | ORAL | 0 refills | Status: DC | PRN

## 2017-05-21 NOTE — Discharge Instructions (Addendum)
Use Tylenol and ibuprofen as needed for pain. Use Flexeril as needed for muscle spasm.   Please follow up as directed and return to the ER or see a physician for new or worsening symptoms.  Thank you. Vitals:   05/21/17 1655 05/21/17 1745 05/21/17 1930  BP: (!) 136/104  108/85  Pulse: (!) 117  100  Resp: 18    Temp:  99.7 F (37.6 C)   TempSrc:  Oral   SpO2: 100%  93%

## 2017-05-21 NOTE — ED Provider Notes (Signed)
[facility] EMERGENCY DEPARTMENT Provider Note   CSN: [id number] Arrival date & time: 05/21/17  1641     History   Chief Complaint Chief Complaint  Patient presents with  . Fall    HPI Samantha Cox is a 62 y.o. female.  Patient with history of tobacco abuse presents with right hip and right lower flank pain since falling sideways off of a horse onto padded flooring with concrete beneath. Patient complains of right upper femur hip and right pelvic tenderness. Pain worse with range of motion the right leg. No blood thinners.no head injury or loss of consciousness. Patient denies any chest, head, upper back pain.     Past Medical History:  Diagnosis Date  . History of tobacco use   . Routine general medical examination at a health care facility   . Screening for lipoid disorders     Patient Active Problem List   Diagnosis Date Noted  . Degenerative lumbar spinal stenosis 10/17/2016  . Leg cramping 09/19/2016  . Morton's neuroma of left foot 05/24/2016  . Piriformis syndrome of left side 05/13/2015  . Greater trochanteric bursitis of left hip 11/25/2014  . Gluteal tendinitis of left buttock 11/25/2014  . Degenerative arthritis of hip 01/16/2014  . Acetabular labrum tear 01/16/2014    Past Surgical History:  Procedure Laterality Date  . OTHER SURGICAL HISTORY     Birthmark removed from forehead 7th grade     OB History   None      Home Medications    Prior to Admission medications   Medication Sig Start Date End Date Taking? Authorizing Provider  cyclobenzaprine (FLEXERIL) 5 MG tablet Take 1 tablet (5 mg total) by mouth 3 (three) times daily as needed for muscle spasms. 05/21/17   Blane Ohara, MD  Diclofenac Sodium 2 % SOLN Place 2 g onto the skin 2 (two) times daily. 08/16/16   Judi Saa, DO  gabapentin (NEURONTIN) 100 MG capsule Take 2 capsules (200 mg total) by mouth at bedtime. 03/27/17   Judi Saa, DO  meloxicam  (MOBIC) 15 MG tablet Take 1 tablet (15 mg total) by mouth daily. 01/24/17   Judi Saa, DO  methocarbamol (ROBAXIN) 500 MG tablet Take 1 tablet (500 mg total) by mouth every 8 (eight) hours as needed for muscle spasms. 08/16/16   Judi Saa, DO  Vitamin D, Ergocalciferol, (DRISDOL) 50000 units CAPS capsule Take 1 capsule (50,000 Units total) by mouth every 7 (seven) days. 10/17/16   Judi Saa, DO    Family History Family History  Problem Relation Age of Onset  . Hypertension Mother   . Atrial fibrillation Father   . CVA Father 58  . Bladder Cancer Father   . Lung cancer Brother   . Diverticulitis Brother     Social History Social History   Tobacco Use  . Smoking status: Former Games developer  . Smokeless tobacco: Never Used  Substance Use Topics  . Alcohol use: Yes    Alcohol/week: 0.6 oz    Types: 1 Glasses of wine per week    Frequency: Never  . Drug use: Never     Allergies   Augmentin [amoxicillin-pot clavulanate]   Review of Systems Review of Systems  Constitutional: Negative for chills and fever.  HENT: Negative for congestion.   Eyes: Negative for visual disturbance.  Respiratory: Negative for shortness of breath.   Cardiovascular: Negative for chest pain.  Gastrointestinal: Negative for abdominal pain and vomiting.  Genitourinary: Positive for flank pain. Negative for dysuria.  Musculoskeletal: Positive for arthralgias. Negative for back pain, neck pain and neck stiffness.  Skin: Negative for rash.  Neurological: Negative for light-headedness and headaches.     Physical Exam Updated Vital Signs BP 108/85   Pulse 100   Temp 99.7 F (37.6 C) (Oral)   Resp 18   SpO2 93%   Physical Exam  Constitutional: She is oriented to person, place, and time. She appears well-developed and well-nourished.  HENT:  Head: Normocephalic and atraumatic.  Eyes: Conjunctivae are normal. Right eye exhibits no discharge. Left eye exhibits no discharge.  Neck:  Normal range of motion. Neck supple. No tracheal deviation present.  Cardiovascular: Normal rate.  Pulmonary/Chest: Effort normal.  Abdominal: Soft. She exhibits no distension. There is tenderness (right pelvic). There is no guarding.  Musculoskeletal: She exhibits no edema.  Patient has tenderness to palpation of proximal lateral right femur and right hip. Patient has pain with flexion and internal rotation of right hip.  Neurological: She is alert and oriented to person, place, and time. No cranial nerve deficit.  Skin: Skin is warm. No rash noted.  Psychiatric: She has a normal mood and affect.  Nursing note and vitals reviewed.    ED Treatments / Results  Labs (all labs ordered are listed, but only abnormal results are displayed) Labs Reviewed  CBC WITH DIFFERENTIAL/PLATELET - Abnormal; Notable for the following components:      Result Value   WBC 12.8 (*)    Hemoglobin 15.2 (*)    Neutro Abs 10.0 (*)    All other components within normal limits  COMPREHENSIVE METABOLIC PANEL - Abnormal; Notable for the following components:   Glucose, Bld 124 (*)    All other components within normal limits    EKG None  Radiology Ct Abdomen Pelvis W Contrast  Result Date: 05/21/2017 CLINICAL DATA:  Pain following fall from horse EXAM: CT ABDOMEN AND PELVIS WITH CONTRAST TECHNIQUE: Multidetector CT imaging of the abdomen and pelvis was performed using the standard protocol following bolus administration of intravenous contrast. CONTRAST:  ISOVUE-300 IOPAMIDOL (ISOVUE-300) INJECTION 61% COMPARISON:  None. FINDINGS: Lower chest: There is posterior bibasilar lung atelectatic change. No lung base edema or consolidation. No pneumothorax. Hepatobiliary: Liver appears intact without appreciable laceration or rupture. No perihepatic fluid. No focal liver lesions are evident. Gallbladder is contracted. There is no biliary duct dilatation. Pancreas: There is no appreciable pancreatic mass or  inflammatory focus. No peripancreatic fluid evident. Spleen: Spleen appears intact without laceration or rupture. No perisplenic fluid. No splenic lesions are evident. Adrenals/Urinary Tract: Adrenals bilaterally appear normal. There is a cyst arising from the upper pole of the left kidney anteriorly measuring 2.7 x 2.2 cm. There is a 5 mm cyst in the posterior upper pole left kidney. There is a cyst arising from the posterior upper pole the right kidney measuring 1.2 x 1.2 cm. There is no appreciable hydronephrosis on either side. There is no perinephric fluid or stranding on either side. No laceration or rupture of either kidney. No contrast extravasation. There is a tiny calculus in the posterior upper pole of the right kidney. There is no ureteral calculus on either side. Urinary bladder is midline with wall thickness within normal limits. Stomach/Bowel: There are scattered sigmoid diverticula without diverticulitis. There is no appreciable bowel wall or mesenteric thickening. No evident bowel obstruction. No free air or portal venous air. Vascular/Lymphatic: There is no abdominal aortic aneurysm. There are foci of  atherosclerotic calcification in the aorta. Major mesenteric vessels appear patent. There is no appreciable adenopathy in the abdomen or pelvis. Reproductive: Uterus is anteverted.  No pelvic mass evident. Other: Appendix appears normal. No abnormal fluid collections in the abdomen or pelvis. No adenopathy or abscess in the abdomen or pelvis. Musculoskeletal: There is mild lumbar dextroscoliosis. There are foci of degenerative change in the lumbar spine. There is calcification posterior and lateral to the left acetabulum, likely due to calcific tendinosis. No evident acute fracture or dislocation. No blastic or lytic bone lesions. No intramuscular or abdominal wall lesion evident. IMPRESSION: 1. No traumatic appearing lesion evident. No visceral injury evident. No abnormal fluid collections. 2.  No  appreciable inflammatory focus in the abdomen or pelvis. 3. No bowel obstruction. No abscess. Appendix region appears normal. Occasional sigmoid diverticula without diverticulitis. 4. Tiny calculus in the posterior upper pole right kidney. No ureteral calculi. No hydronephrosis. 5.  Aortic atherosclerosis. Aortic Atherosclerosis (ICD10-I70.0). Electronically Signed   By: Bretta BangWilliam  Woodruff III M.D.   On: 05/21/2017 20:56    Procedures Procedures (including critical care time)  Medications Ordered in ED Medications  iopamidol (ISOVUE-300) 61 % injection (has no administration in time range)  HYDROcodone-acetaminophen (NORCO/VICODIN) 5-325 MG per tablet 2 tablet (0 tablets Oral Hold 05/21/17 1953)  ibuprofen (ADVIL,MOTRIN) tablet 600 mg (has no administration in time range)  cyclobenzaprine (FLEXERIL) tablet 10 mg (has no administration in time range)  iopamidol (ISOVUE-300) 61 % injection 100 mL (100 mLs Intravenous Contrast Given 05/21/17 2009)     Initial Impression / Assessment and Plan / ED Course  I have reviewed the triage vital signs and the nursing notes.  Pertinent labs & imaging results that were available during my care of the patient were reviewed by me and considered in my medical decision making (see chart for details).    Patient presents after fall off a horse with isolated right pelvic and right hip tenderness. Discuss CT scans, screening blood work and pain meds as needed. Patient well-appearing otherwise. Pain gradually returning Orderedibuprofen and Flexeril.  Blood work reviewed unremarkable. CT scan reviewed fracture or acute bleeding. Patient stable for outpatient follow.  Results and differential diagnosis were discussed with the patient/parent/guardian. Xrays were independently reviewed by myself.  Close follow up outpatient was discussed, comfortable with the plan.   Medications  iopamidol (ISOVUE-300) 61 % injection (has no administration in time range)    HYDROcodone-acetaminophen (NORCO/VICODIN) 5-325 MG per tablet 2 tablet (0 tablets Oral Hold 05/21/17 1953)  ibuprofen (ADVIL,MOTRIN) tablet 600 mg (has no administration in time range)  cyclobenzaprine (FLEXERIL) tablet 10 mg (has no administration in time range)  iopamidol (ISOVUE-300) 61 % injection 100 mL (100 mLs Intravenous Contrast Given 05/21/17 2009)    Vitals:   05/21/17 1655 05/21/17 1745 05/21/17 1930  BP: (!) 136/104  108/85  Pulse: (!) 117  100  Resp: 18    Temp:  99.7 F (37.6 C)   TempSrc:  Oral   SpO2: 100%  93%    Final diagnoses:  Fall from horse, initial encounter  Acute right hip pain     Final Clinical Impressions(s) / ED Diagnoses   Final diagnoses:  Fall from horse, initial encounter  Acute right hip pain    ED Discharge Orders        Ordered    cyclobenzaprine (FLEXERIL) 5 MG tablet  3 times daily PRN     05/21/17 2103       Blane OharaZavitz, Brittaney Beaulieu, MD 05/21/17  2106  

## 2017-05-21 NOTE — ED Triage Notes (Signed)
Patient presents to ED for assessment of right hip and lower abdominal pain after falling sideways off of a horse on to a padded flooring (with concrete underneath).  Patient c/o right hip/femur and lower mid and right abdominal pain.  States pain is worst with walking, able to bear weight.

## 2017-05-25 DIAGNOSIS — S76211A Strain of adductor muscle, fascia and tendon of right thigh, initial encounter: Secondary | ICD-10-CM | POA: Diagnosis not present

## 2017-06-02 ENCOUNTER — Ambulatory Visit (INDEPENDENT_AMBULATORY_CARE_PROVIDER_SITE_OTHER): Payer: BLUE CROSS/BLUE SHIELD | Admitting: Family Medicine

## 2017-06-02 DIAGNOSIS — M48061 Spinal stenosis, lumbar region without neurogenic claudication: Secondary | ICD-10-CM

## 2017-06-02 DIAGNOSIS — M1612 Unilateral primary osteoarthritis, left hip: Secondary | ICD-10-CM

## 2017-06-02 NOTE — Patient Instructions (Signed)
Good to see you  Heat 10 minutes 2 times a day as needed   arnica lotion 2 times a day   Lets give the back another 2 weeks and see how it is doing Try the meloxicam only  Write me in 2 weeks

## 2017-06-02 NOTE — Progress Notes (Signed)
Tawana ScaleZach Izzak Fries D.O. [facility] [address] [place], [place] [address] Phone: [phone] Subjective:      CC: Back pain  IEP:PIRJJOACZYHPI:Subjective  Samantha Cox is a 62 y.o. female coming in with complaint of lower back pain and new onset R hip pain on 05/21/17 when she fell from a horse and landed on her R side.  R hip and groin pain are improving but is now having more of an issue w/ lower pelvic pain.  States that she would also like to discuss some of her medications to see which ones she can/can't take together.    Onset- 05/21/17 when she fell off a horse and landed on her R side Location- R greater trochanter and lower pelvis Duration-  Character- aching pain in lower pelvis Aggravating factors- weight bearing / walking Reliving factors- rest, medication as needed Therapies tried- Flexeril but not using currently. Severity- 6/10 at it's worst w/ walking     Past Medical History:  Diagnosis Date  . History of tobacco use   . Routine general medical examination at a health care facility   . Screening for lipoid disorders    Past Surgical History:  Procedure Laterality Date  . OTHER SURGICAL HISTORY     Birthmark removed from forehead 7th grade   Social History   Socioeconomic History  . Marital status: Married    Spouse name: Not on file  . Number of children: Not on file  . Years of education: Not on file  . Highest education level: Not on file  Occupational History  . Not on file  Social Needs  . Financial resource strain: Not on file  . Food insecurity:    Worry: Not on file    Inability: Not on file  . Transportation needs:    Medical: Not on file    Non-medical: Not on file  Tobacco Use  . Smoking status: Former Games developermoker  . Smokeless tobacco: Never Used  Substance and Sexual Activity  . Alcohol use: Yes    Alcohol/week: 0.6 oz    Types: 1 Glasses of wine per week    Frequency: Never  . Drug use: Never  . Sexual activity: Not on file    Lifestyle  . Physical activity:    Days per week: Not on file    Minutes per session: Not on file  . Stress: Not on file  Relationships  . Social connections:    Talks on phone: Not on file    Gets together: Not on file    Attends religious service: Not on file    Active member of club or organization: Not on file    Attends meetings of clubs or organizations: Not on file    Relationship status: Not on file  Other Topics Concern  . Not on file  Social History Narrative  . Not on file   Allergies  Allergen Reactions  . Augmentin [Amoxicillin-Pot Clavulanate] Diarrhea and Other (See Comments)    Felt very bad     Family History  Problem Relation Age of Onset  . Hypertension Mother   . Atrial fibrillation Father   . CVA Father [id number]  . Bladder Cancer Father   . Lung cancer Brother   . Diverticulitis Brother      Past medical history, social, surgical and family history all reviewed in electronic medical record.  No pertanent information unless stated regarding to the chief complaint.   Review of Systems:Review of systems updated and  as accurate as of 06/02/17  No headache, visual changes, nausea, vomiting, diarrhea, constipation, dizziness, abdominal pain, skin rash, fevers, chills, night sweats, weight loss, swollen lymph nodes, body aches, joint swelling, muscle aches, chest pain, shortness of breath, mood changes.   Objective  Blood pressure 130/80, pulse 73, height 5\' 6"  (1.676 m), weight 174 lb (78.9 kg), SpO2 97 %.  Systems examined below as of 06/02/17   General: No apparent distress alert and oriented x3 mood and affect normal, dressed appropriately.  HEENT: Pupils equal, extraocular movements intact  Respiratory: Patient's speak in full sentences and does not appear short of breath  Cardiovascular: No lower extremity edema, non tender, no erythema  Skin: Warm dry intact with no signs of infection or rash on extremities or on axial skeleton.  Abdomen: Soft  nontender  Neuro: Cranial nerves II through XII are intact, neurovascularly intact in all extremities with 2+ DTRs and 2+ pulses.  Lymph: No lymphadenopathy of posterior or anterior cervical chain or axillae bilaterally.  Gait normal with good balance and coordination.  MSK:  Non tender with full range of motion and good stability and symmetric strength and tone of shoulders, elbows, wrist, knee and ankles bilaterally.  Right hip exam does reveal the patient does have bruising on the lateral aspect of the hip and thigh.  Patient does have some pain with internal rotation of the hip.  Negative straight leg test.  Moderate tenderness to palpation of the paraspinal musculature of the lumbar spine.  Positive Pearlean Brownie.  Neurovascularly intact distally.    Impression and Recommendations:     This case required medical decision making of moderate complexity.      Note: This dictation was prepared with Dragon dictation along with smaller phrase technology. Any transcriptional errors that result from this process are unintentional.

## 2017-06-03 ENCOUNTER — Encounter: Payer: Self-pay | Admitting: Family Medicine

## 2017-06-03 NOTE — Assessment & Plan Note (Signed)
Mild arthritic changes of the hip.  Discussed with patient about icing regimen.  I think this is a fall.  Topical anti-inflammatories.  Patient is not taking any medications at the moment though.  Discussed that she could take some of the ones such as her gabapentin that could be beneficial.  Follow-up again in 3 weeks

## 2017-06-03 NOTE — Assessment & Plan Note (Signed)
Patient recently did undergo an epidural.  Patient was doing very well but did have this fall from the horse.  There is for potential exacerbation.  Encourage patient to monitor but try to do any other intervention at this time.  Patient is in agreement with plan.  Will call if worsening symptoms.  Follow-up again in 4 weeks

## 2017-08-22 DIAGNOSIS — R87810 Cervical high risk human papillomavirus (HPV) DNA test positive: Secondary | ICD-10-CM | POA: Diagnosis not present

## 2017-08-22 DIAGNOSIS — R87612 Low grade squamous intraepithelial lesion on cytologic smear of cervix (LGSIL): Secondary | ICD-10-CM | POA: Diagnosis not present

## 2017-08-23 ENCOUNTER — Ambulatory Visit: Payer: Self-pay

## 2017-08-23 ENCOUNTER — Encounter: Payer: Self-pay | Admitting: Family Medicine

## 2017-08-23 ENCOUNTER — Encounter

## 2017-08-23 ENCOUNTER — Ambulatory Visit (INDEPENDENT_AMBULATORY_CARE_PROVIDER_SITE_OTHER): Payer: BLUE CROSS/BLUE SHIELD | Admitting: Family Medicine

## 2017-08-23 VITALS — BP 110/76 | HR 87 | Ht 66.0 in | Wt 171.0 lb

## 2017-08-23 DIAGNOSIS — M25552 Pain in left hip: Secondary | ICD-10-CM | POA: Diagnosis not present

## 2017-08-23 DIAGNOSIS — G5702 Lesion of sciatic nerve, left lower limb: Secondary | ICD-10-CM

## 2017-08-23 NOTE — Patient Instructions (Signed)
Good to see yo u We tried another piriformis injection and hope it helps Give it 2 weeks and if not better call and we will do another epidural  If we do an epidural then want to see you again 2-3 weeks after that

## 2017-08-23 NOTE — Progress Notes (Signed)
Tawana ScaleZach Ahmarion Saraceno D.O. [facility] [address] [place], [place] [address] Phone: [phone] Subjective:      CC: Back pain follow-up.  OZH:[id number]:Subjective  Samantha Cox is a 62 y.o. female coming in with complaint of left hip pain. Patient has been having pain in the left piriformis with pain that radiates to the groin. Has had an injection previously. Continues to have lower back pain since accident.  Patient has known severe spinal stenosis.  Has had epidurals but would like to avoid another one at the moment.  Did respond well to the piriformis     Past Medical History:  Diagnosis Date  . History of tobacco use   . Routine general medical examination at a health care facility   . Screening for lipoid disorders    Past Surgical History:  Procedure Laterality Date  . OTHER SURGICAL HISTORY     Birthmark removed from forehead 7th grade   Social History   Socioeconomic History  . Marital status: Married    Spouse name: Not on file  . Number of children: Not on file  . Years of education: Not on file  . Highest education level: Not on file  Occupational History  . Not on file  Social Needs  . Financial resource strain: Not on file  . Food insecurity:    Worry: Not on file    Inability: Not on file  . Transportation needs:    Medical: Not on file    Non-medical: Not on file  Tobacco Use  . Smoking status: Former Games developermoker  . Smokeless tobacco: Never Used  Substance and Sexual Activity  . Alcohol use: Yes    Alcohol/week: 0.6 oz    Types: 1 Glasses of wine per week    Frequency: Never  . Drug use: Never  . Sexual activity: Not on file  Lifestyle  . Physical activity:    Days per week: Not on file    Minutes per session: Not on file  . Stress: Not on file  Relationships  . Social connections:    Talks on phone: Not on file    Gets together: Not on file    Attends religious service: Not on file    Active member of club or organization: Not on file   Attends meetings of clubs or organizations: Not on file    Relationship status: Not on file  Other Topics Concern  . Not on file  Social History Narrative  . Not on file   Allergies  Allergen Reactions  . Augmentin [Amoxicillin-Pot Clavulanate] Diarrhea and Other (See Comments)    Felt very bad     Family History  Problem Relation Age of Onset  . Hypertension Mother   . Atrial fibrillation Father   . CVA Father [phone]  . Bladder Cancer Father   . Lung cancer Brother   . Diverticulitis Brother      Past medical history, social, surgical and family history all reviewed in electronic medical record.  No pertanent information unless stated regarding to the chief complaint.   Review of Systems:Review of systems updated and as accurate as of 08/23/17  No headache, visual changes, nausea, vomiting, diarrhea, constipation, dizziness, abdominal pain, skin rash, fevers, chills, night sweats, weight loss, swollen lymph nodes, body aches, joint swelling, muscle aches, chest pain, shortness of breath, mood changes.  Positive muscle aches  Objective  Blood pressure 110/76, pulse 87, height 5\' 6"  (1.676 m), weight 171 lb (77.6 kg), SpO2  97 %. Systems examined below as of 08/23/17   General: No apparent distress alert and oriented x3 mood and affect normal, dressed appropriately.  HEENT: Pupils equal, extraocular movements intact  Respiratory: Patient's speak in full sentences and does not appear short of breath  Cardiovascular: No lower extremity edema, non tender, no erythema  Skin: Warm dry intact with no signs of infection or rash on extremities or on axial skeleton.  Abdomen: Soft nontender  Neuro: Cranial nerves II through XII are intact, neurovascularly intact in all extremities with 2+ DTRs and 2+ pulses.  Lymph: No lymphadenopathy of posterior or anterior cervical chain or axillae bilaterally.  Gait normal with good balance and coordination.  MSK:  Non tender with full range of motion  and good stability and symmetric strength and tone of shoulders, elbows, wrist,  knee and ankles bilaterally.  Back exam shows loss of lordosis.  Some mild tenderness to palpation the paraspinal muscular lumbar spine left greater than right.  Negative straight leg test.  Positive Faber test with severe tenderness over the piriformis.  Neurovascularly intact distally with 4+ out of 5 strength that is symmetric  Procedure: Real-time Ultrasound Guided Injection of left piriformis tendon sheath Device: GE Logiq Q7 Ultrasound guided injection is preferred based studies that show increased duration, increased effect, greater accuracy, decreased procedural pain, increased response rate, and decreased cost with ultrasound guided versus blind injection.  Verbal informed consent obtained.  Time-out conducted.  Noted no overlying erythema, induration, or other signs of local infection.  Skin prepped in a sterile fashion.  Local anesthesia: Topical Ethyl chloride.  With sterile technique and under real time ultrasound guidance: With a 21-gauge 2 inch needle patient was injected in the left piriformis with a total of 0.5 cc of 0.5% Marcaine and 1 cc of Kenalog 40 mg/mL Completed without difficulty  Pain immediately resolved suggesting accurate placement of the medication.  Advised to call if fevers/chills, erythema, induration, drainage, or persistent bleeding.  Images permanently stored and available for review in the ultrasound unit.  Impression: Technically successful ultrasound guided injection.   Impression and Recommendations:     This case required medical decision making of moderate complexity.      Note: This dictation was prepared with Dragon dictation along with smaller phrase technology. Any transcriptional errors that result from this process are unintentional.

## 2017-08-23 NOTE — Assessment & Plan Note (Signed)
Injected.  Was having worsening pain.  Not doing as well with the meloxicam and given ibuprofen.  Encourage patient to take the gabapentin.  Discussed other medications.  Encourage patient to do home exercise on a more regular basis.  If this fails epidurals is necessary for the back.  Follow-up again in 4 weeks

## 2017-08-29 ENCOUNTER — Encounter: Payer: Self-pay | Admitting: *Deleted

## 2017-08-29 ENCOUNTER — Telehealth: Payer: Self-pay | Admitting: Family Medicine

## 2017-08-29 NOTE — Telephone Encounter (Signed)
Message sent to pt in mychart

## 2017-08-29 NOTE — Telephone Encounter (Signed)
Copied from CRM [id number]. Topic: General - Other >> Aug 29, 2017  8:54 AM Burchel, Abbi R wrote: Pt needs Dr Katrinka BlazingSmith so send a MyChart msg to pt so that she can reply to it and begin a conversation.  Pt states that she discussed this with Dr. Katrinka BlazingSmith at her last appt.     Pt: [phone]

## 2017-09-06 DIAGNOSIS — M25551 Pain in right hip: Secondary | ICD-10-CM | POA: Diagnosis not present

## 2017-09-06 DIAGNOSIS — G8929 Other chronic pain: Secondary | ICD-10-CM | POA: Insufficient documentation

## 2017-09-06 DIAGNOSIS — M545 Low back pain: Secondary | ICD-10-CM | POA: Diagnosis not present

## 2017-09-21 ENCOUNTER — Other Ambulatory Visit: Payer: Self-pay

## 2017-09-21 ENCOUNTER — Ambulatory Visit (INDEPENDENT_AMBULATORY_CARE_PROVIDER_SITE_OTHER): Payer: BLUE CROSS/BLUE SHIELD | Admitting: Family Medicine

## 2017-09-21 ENCOUNTER — Encounter: Payer: Self-pay | Admitting: Family Medicine

## 2017-09-21 VITALS — BP 110/78 | HR 88 | Ht 66.0 in | Wt 171.0 lb

## 2017-09-21 DIAGNOSIS — M48061 Spinal stenosis, lumbar region without neurogenic claudication: Secondary | ICD-10-CM | POA: Diagnosis not present

## 2017-09-21 MED ORDER — VITAMIN D (ERGOCALCIFEROL) 1.25 MG (50000 UNIT) PO CAPS: 50000.0000 [IU] | ORAL_CAPSULE | ORAL | 0 refills | Status: AC

## 2017-09-21 MED ORDER — VITAMIN D (ERGOCALCIFEROL) 1.25 MG (50000 UNIT) PO CAPS: 50000.0000 [IU] | ORAL_CAPSULE | ORAL | 0 refills | Status: DC

## 2017-09-21 MED ORDER — METHOCARBAMOL 500 MG PO TABS: 500.0000 mg | ORAL_TABLET | Freq: Three times a day (TID) | ORAL | 0 refills | Status: DC | PRN

## 2017-09-21 NOTE — Assessment & Plan Note (Signed)
Severe, to leave against patient most of the radicular symptoms.  Has responded to epidurals in the past.  Patient does not want any surgical intervention.  Discussed icing regimen, home exercise, refilled vitamin D, encouraged to gabapentin, has meloxicam as well as Robaxin.  We discussed with patient about formal physical therapy and will be sent for this.  Known arthritic changes of the hip as well.  Patient is going to try these conservative therapies and follow-up with me again in 4 to 6 weeks.  Spent  25 minutes with patient face-to-face and had greater than 50% of counseling including as described above in assessment and plan.

## 2017-09-21 NOTE — Progress Notes (Signed)
[name], [name] Has had excruciating pain since we last saw her she states. Has been on vacation for the past 2 weeks. Did not have a lot of pain with vacation and today her pain is at 6/10. She has had a hard time putting on pants. Was put on prednisone for 1 week from [facility].  Patient has known severe spinal stenosis at L4-L5 that has responded to 3 separate epidurals but short-term.  Patient's now is now not going to be riding horses as much and is willing to focus on her own care at this time.  Patient is looking for more guidance.  Was here for an injection she was thinking but today is doing somewhat better so does not know if that is necessary.      Past Medical History:  Diagnosis Date  . History of tobacco use   . Routine general medical examination at a health care facility   . Screening for lipoid disorders    Past Surgical History:  Procedure Laterality Date  . OTHER SURGICAL HISTORY     Birthmark removed from forehead 7th grade   Social History   Socioeconomic History  . Marital status: Married    Spouse name: Not on file  . Number of children: Not on file  . Years of education: Not on file  . Highest education level: Not on file  Occupational History  . Not on file  Social Needs  . Financial resource strain: Not on file  . Food insecurity:    Worry: Not on file    Inability: Not on file  . Transportation needs:    Medical: Not on file    Non-medical: Not on file  Tobacco Use  . Smoking status: Former Games developermoker  . Smokeless tobacco: Never Used  Substance and Sexual Activity  . Alcohol use: Yes    Alcohol/week: 1.0 standard drinks    Types: 1 Glasses of wine per week    Frequency: Never  . Drug use: Never  . Sexual activity:  Not on file  Lifestyle  . Physical activity:    Days per week: Not on file    Minutes per session: Not on file  . Stress: Not on file  Relationships  . Social connections:    Talks on phone: Not on file    Gets together: Not on file    Attends religious service: Not on file    Active member of club or organization: Not on file    Attends meetings of clubs or organizations: Not on file    Relationship status: Not on file  Other Topics Concern  . Not on file  Social History Narrative  . Not on file   Allergies  Allergen Reactions  . Augmentin [Amoxicillin-Pot Clavulanate] Diarrhea and Other (See Comments)    Felt very bad     Family History  Problem Relation Age of Onset  . Hypertension Mother   . Atrial fibrillation Father   . CVA Father 178  . Bladder Cancer Father   . Lung cancer Brother   . Diverticulitis Brother      Past medical history, social, surgical and family history all reviewed in electronic medical record.  No pertanent information unless stated regarding  to the chief complaint.   Review of Systems:Review of systems updated and as accurate as of 09/21/17  No headache, visual changes, nausea, vomiting, diarrhea, constipation, dizziness, abdominal pain, skin rash, fevers, chills, night sweats, weight loss, swollen lymph nodes, body aches, joint swelling, , chest pain, shortness of breath, mood changes.  Positive muscle aches  Objective  Blood pressure 110/78, pulse 88, height 5\' 6"  (1.676 m), weight 171 lb (77.6 kg), SpO2 98 %. Systems examined below as of 09/21/17   General: No apparent distress alert and oriented x3 mood and affect normal, dressed appropriately.  HEENT: Pupils equal, extraocular movements intact  Respiratory: Patient's speak in full sentences and does not appear short of breath  Cardiovascular: No lower extremity edema, non tender, no erythema  Skin: Warm dry intact with no signs of infection or rash on extremities or on axial skeleton.    Abdomen: Soft nontender  Neuro: Cranial nerves II through XII are intact, neurovascularly intact in all extremities with 2+ DTRs and 2+ pulses.  Lymph: No lymphadenopathy of posterior or anterior cervical chain or axillae bilaterally.  Gait normal with good balance and coordination.  MSK:  Non tender with full range of motion and good stability and symmetric strength and tone of shoulders, elbows, wrist, hip, knee and ankles bilaterally.  Mild arthritic changes of multiple joints  Back Exam:  Inspection: Significant loss of lordosis Motion: Flexion 45 deg, Extension25 deg, Side Bending to 35 deg bilaterally,  Rotation to 35 deg bilaterally  SLR laying: Negative  XSLR laying: Negative  Palpable tenderness: Tender to palpation the paraspinal musculature diffusely of the lumbar spine. FABER: Positive right. Sensory change: Gross sensation intact to all lumbar and sacral dermatomes.  Reflexes: 2+ at both patellar tendons, 2+ at achilles tendons, Babinski's downgoing.  Strength at foot  4+ out of 5 strength in the right side compared to the left    Impression and Recommendations:     This case required medical decision making of moderate complexity.      Note: This dictation was prepared with Dragon dictation along with smaller phrase technology. Any transcriptional errors that result from this process are unintentional.

## 2017-09-21 NOTE — Patient Instructions (Addendum)
Good to see you  Ice is your friend.  Stay active We will get youGustavus [facility]  Refilled the meds.  I am glad you are going to take time for you  See me again in 4 weeks If you need us call [phone]

## 2017-09-26 DIAGNOSIS — M545 Low back pain: Secondary | ICD-10-CM | POA: Diagnosis not present

## 2017-09-29 ENCOUNTER — Other Ambulatory Visit: Payer: Self-pay | Admitting: Obstetrics & Gynecology

## 2017-09-29 DIAGNOSIS — Z9889 Other specified postprocedural states: Secondary | ICD-10-CM | POA: Diagnosis not present

## 2017-09-29 DIAGNOSIS — Z780 Asymptomatic menopausal state: Secondary | ICD-10-CM | POA: Diagnosis not present

## 2017-09-29 DIAGNOSIS — N870 Mild cervical dysplasia: Secondary | ICD-10-CM | POA: Diagnosis not present

## 2017-09-29 DIAGNOSIS — N882 Stricture and stenosis of cervix uteri: Secondary | ICD-10-CM | POA: Diagnosis not present

## 2017-10-03 DIAGNOSIS — M545 Low back pain: Secondary | ICD-10-CM | POA: Diagnosis not present

## 2017-10-05 DIAGNOSIS — M545 Low back pain: Secondary | ICD-10-CM | POA: Diagnosis not present

## 2017-10-12 DIAGNOSIS — M545 Low back pain: Secondary | ICD-10-CM | POA: Diagnosis not present

## 2017-10-19 DIAGNOSIS — M545 Low back pain: Secondary | ICD-10-CM | POA: Diagnosis not present

## 2017-10-19 DIAGNOSIS — H8111 Benign paroxysmal vertigo, right ear: Secondary | ICD-10-CM | POA: Diagnosis not present

## 2017-10-19 DIAGNOSIS — M542 Cervicalgia: Secondary | ICD-10-CM | POA: Diagnosis not present

## 2017-10-23 ENCOUNTER — Ambulatory Visit (INDEPENDENT_AMBULATORY_CARE_PROVIDER_SITE_OTHER): Payer: BLUE CROSS/BLUE SHIELD | Admitting: Family Medicine

## 2017-10-23 ENCOUNTER — Encounter: Payer: Self-pay | Admitting: Family Medicine

## 2017-10-23 DIAGNOSIS — M1612 Unilateral primary osteoarthritis, left hip: Secondary | ICD-10-CM | POA: Diagnosis not present

## 2017-10-23 DIAGNOSIS — M48061 Spinal stenosis, lumbar region without neurogenic claudication: Secondary | ICD-10-CM | POA: Diagnosis not present

## 2017-10-23 NOTE — Progress Notes (Signed)
Samantha Cox [address] [place], [place] [address] Phone: [phone] Subjective:    I Samantha Cox am serving as a Neurosurgeon for Dr. [name].    CC: Back pain follow-up  EXB:[id number]  Samantha Cox is a 62 y.o. female coming in with complaint of back pain. States that her back is doing better. PT is going well. Right sided back pain today.  History of spinal stenosis.  Has had epidurals with mild improvement as well as the piriformis.  Patient has been doing the physical therapy and feels like this is been more beneficial than anything else she has done recently.  States that this is the back best her back is felt in years.      Past Medical History:  Diagnosis Date  . History of tobacco use   . Routine general medical examination at a health care facility   . Screening for lipoid disorders    Past Surgical History:  Procedure Laterality Date  . OTHER SURGICAL HISTORY     Birthmark removed from forehead 7th grade   Social History   Socioeconomic History  . Marital status: Married    Spouse name: Not on file  . Number of children: Not on file  . Years of education: Not on file  . Highest education level: Not on file  Occupational History  . Not on file  Social Needs  . Financial resource strain: Not on file  . Food insecurity:    Worry: Not on file    Inability: Not on file  . Transportation needs:    Medical: Not on file    Non-medical: Not on file  Tobacco Use  . Smoking status: Former Games developer  . Smokeless tobacco: Never Used  Substance and Sexual Activity  . Alcohol use: Yes    Alcohol/week: 1.0 standard drinks    Types: 1 Glasses of wine per week    Frequency: Never  . Drug use: Never  . Sexual activity: Not on file  Lifestyle  . Physical activity:    Days per week: Not on file    Minutes per session: Not on file  . Stress: Not on file  Relationships  . Social connections:    Talks on phone: Not on file   Gets together: Not on file    Attends religious service: Not on file    Active member of club or organization: Not on file    Attends meetings of clubs or organizations: Not on file    Relationship status: Not on file  Other Topics Concern  . Not on file  Social History Narrative  . Not on file   Allergies  Allergen Reactions  . Augmentin [Amoxicillin-Pot Clavulanate] Diarrhea and Other (See Comments)    Felt very bad     Family History  Problem Relation Age of Onset  . Hypertension Mother   . Atrial fibrillation Father   . CVA Father 53  . Bladder Cancer Father   . Lung cancer Brother   . Diverticulitis Brother        Current Outpatient Medications (Analgesics):  .  meloxicam (MOBIC) 15 MG tablet, Take 1 tablet (15 mg total) by mouth daily. (Patient not taking: Reported on 10/23/2017)   Current Outpatient Medications (Other):  Marland Kitchen  Diclofenac Sodium 2 % SOLN, Place 2 g onto the skin 2 (two) times daily. Marland Kitchen  gabapentin (NEURONTIN) 100 MG capsule, Take 2 capsules (200 mg total) by mouth at bedtime. Marland Kitchen  methocarbamol (ROBAXIN) 500 MG tablet, Take 1 tablet (500 mg total) by mouth every 8 (eight) hours as needed for muscle spasms. .  Vitamin D, Ergocalciferol, (DRISDOL) 50000 units CAPS capsule, Take 1 capsule (50,000 Units total) by mouth every 7 (seven) days.    Past medical history, social, surgical and family history all reviewed in electronic medical record.  No pertanent information unless stated regarding to the chief complaint.   Review of Systems:  No headache, visual changes, nausea, vomiting, diarrhea, constipation, dizziness, abdominal pain, skin rash, fevers, chills, night sweats, weight loss, swollen lymph nodes, body aches, joint swelling, muscle aches, chest pain, shortness of breath, mood changes.   Objective  Blood pressure 100/80, pulse 78, height 5\' 6"  (1.676 m), weight 170 lb (77.1 kg), SpO2 96 %.    General: No apparent distress alert and oriented x3  mood and affect normal, dressed appropriately.  HEENT: Pupils equal, extraocular movements intact  Respiratory: Patient's speak in full sentences and does not appear short of breath  Cardiovascular: No lower extremity edema, non tender, no erythema  Skin: Warm dry intact with no signs of infection or rash on extremities or on axial skeleton.  Abdomen: Soft nontender  Neuro: Cranial nerves II through XII are intact, neurovascularly intact in all extremities with 2+ DTRs and 2+ pulses.  Lymph: No lymphadenopathy of posterior or anterior cervical chain or axillae bilaterally.  Gait antalgic MSK:  Non tender with full range of motion and good stability and symmetric strength and tone of shoulders, elbows, wrist, hip, knee and ankles bilaterally.  Mild arthritic changes of multiple joints Back exam does have loss of lordosis.  Still tightness on straight leg test but no radicular symptoms.  Unable to do [facility] test completely secondary to discomfort and pain no.  Neurovascularly intact distally.  Still some limited extension.    Impression and Recommendations:     This case required medical decision making of moderate complexity. The above documentation has been reviewed and is accurate and complete Samantha SaaZachary M Smith, DO       Note: This dictation was prepared with Dragon dictation along with smaller phrase technology. Any transcriptional errors that result from this process are unintentional.

## 2017-10-23 NOTE — Assessment & Plan Note (Signed)
Has done much better overall.  Discussed icing regimen and home exercises.  Discussed which activities of doing which wants to avoid.  Encourage patient to increase activity as tolerated.  Follow-up again in 4 to 6 weeks

## 2017-10-23 NOTE — Assessment & Plan Note (Signed)
Still believe causing some of patient's discomfort and pain.  We will continue to monitor.

## 2017-10-23 NOTE — Patient Instructions (Signed)
Good to see you  I am so proud of you  Keep it up  Gabapentin 0-3 pills at night will be fine Stay active Continue with PT  Write me in 4 weeks and tell me how you are doing.

## 2017-10-24 DIAGNOSIS — M542 Cervicalgia: Secondary | ICD-10-CM | POA: Diagnosis not present

## 2017-10-24 DIAGNOSIS — M545 Low back pain: Secondary | ICD-10-CM | POA: Diagnosis not present

## 2017-10-24 DIAGNOSIS — H8111 Benign paroxysmal vertigo, right ear: Secondary | ICD-10-CM | POA: Diagnosis not present

## 2017-10-26 DIAGNOSIS — M545 Low back pain: Secondary | ICD-10-CM | POA: Diagnosis not present

## 2017-10-26 DIAGNOSIS — H8111 Benign paroxysmal vertigo, right ear: Secondary | ICD-10-CM | POA: Diagnosis not present

## 2017-10-26 DIAGNOSIS — M542 Cervicalgia: Secondary | ICD-10-CM | POA: Diagnosis not present

## 2017-11-07 DIAGNOSIS — H8111 Benign paroxysmal vertigo, right ear: Secondary | ICD-10-CM | POA: Diagnosis not present

## 2017-11-07 DIAGNOSIS — M545 Low back pain: Secondary | ICD-10-CM | POA: Diagnosis not present

## 2017-11-07 DIAGNOSIS — M542 Cervicalgia: Secondary | ICD-10-CM | POA: Diagnosis not present

## 2017-11-14 ENCOUNTER — Telehealth: Payer: Self-pay | Admitting: Family Medicine

## 2017-11-14 DIAGNOSIS — M48061 Spinal stenosis, lumbar region without neurogenic claudication: Secondary | ICD-10-CM

## 2017-11-14 NOTE — Telephone Encounter (Signed)
Copied from CRM [id number]. Topic: Quick Communication - See Telephone Encounter >> Nov 14, 2017  4:50 PM Arlyss Gandy, NT wrote: CRM for notification. See Telephone encounter for: 11/14/17. Pt states that she is having generalized back pain and left hip pain that is radiating down her leg and this is different from the pain she had been having. She would like to speak with Dr. Katrinka Blazing either via phone or mychart message.

## 2017-11-15 NOTE — Telephone Encounter (Signed)
Sent mychart message to patient

## 2017-11-16 DIAGNOSIS — M542 Cervicalgia: Secondary | ICD-10-CM | POA: Diagnosis not present

## 2017-11-16 DIAGNOSIS — H8111 Benign paroxysmal vertigo, right ear: Secondary | ICD-10-CM | POA: Diagnosis not present

## 2017-11-16 DIAGNOSIS — M545 Low back pain: Secondary | ICD-10-CM | POA: Diagnosis not present

## 2017-11-21 DIAGNOSIS — H8111 Benign paroxysmal vertigo, right ear: Secondary | ICD-10-CM | POA: Diagnosis not present

## 2017-11-21 DIAGNOSIS — M545 Low back pain: Secondary | ICD-10-CM | POA: Diagnosis not present

## 2017-11-21 DIAGNOSIS — M542 Cervicalgia: Secondary | ICD-10-CM | POA: Diagnosis not present

## 2017-11-24 NOTE — Addendum Note (Signed)
Addended by: Edwena Felty T on: 11/24/2017 09:36 AM   Modules accepted: Orders

## 2017-11-24 NOTE — Telephone Encounter (Signed)
Epidural ordered & sent to [facility].

## 2017-12-04 DIAGNOSIS — Z136 Encounter for screening for cardiovascular disorders: Secondary | ICD-10-CM | POA: Diagnosis not present

## 2017-12-04 DIAGNOSIS — F17201 Nicotine dependence, unspecified, in remission: Secondary | ICD-10-CM | POA: Diagnosis not present

## 2017-12-04 DIAGNOSIS — M545 Low back pain: Secondary | ICD-10-CM | POA: Diagnosis not present

## 2017-12-04 DIAGNOSIS — E559 Vitamin D deficiency, unspecified: Secondary | ICD-10-CM | POA: Diagnosis not present

## 2017-12-04 DIAGNOSIS — I700 Atherosclerosis of aorta: Secondary | ICD-10-CM | POA: Diagnosis not present

## 2017-12-04 DIAGNOSIS — H8111 Benign paroxysmal vertigo, right ear: Secondary | ICD-10-CM | POA: Diagnosis not present

## 2017-12-04 DIAGNOSIS — Z23 Encounter for immunization: Secondary | ICD-10-CM | POA: Diagnosis not present

## 2017-12-04 DIAGNOSIS — Z0000 Encounter for general adult medical examination without abnormal findings: Secondary | ICD-10-CM | POA: Diagnosis not present

## 2017-12-04 DIAGNOSIS — M542 Cervicalgia: Secondary | ICD-10-CM | POA: Diagnosis not present

## 2017-12-04 DIAGNOSIS — Z7184 Encounter for health counseling related to travel: Secondary | ICD-10-CM | POA: Diagnosis not present

## 2017-12-05 DIAGNOSIS — E538 Deficiency of other specified B group vitamins: Secondary | ICD-10-CM | POA: Diagnosis not present

## 2017-12-07 ENCOUNTER — Ambulatory Visit
Admission: RE | Admit: 2017-12-07 | Discharge: 2017-12-07 | Disposition: A | Discharge status: Home / Self Care | Payer: BLUE CROSS/BLUE SHIELD | Source: Ambulatory Visit | Attending: Family Medicine | Admitting: Family Medicine

## 2017-12-07 DIAGNOSIS — M545 Low back pain: Secondary | ICD-10-CM | POA: Diagnosis not present

## 2017-12-07 DIAGNOSIS — M48061 Spinal stenosis, lumbar region without neurogenic claudication: Secondary | ICD-10-CM

## 2017-12-07 MED ORDER — IOPAMIDOL (ISOVUE-M 200) INJECTION 41%
1.0000 mL | Freq: Once | INTRAMUSCULAR | Status: AC
  Administered 2017-12-07: 1 mL via EPIDURAL

## 2017-12-07 MED ORDER — METHYLPREDNISOLONE ACETATE 40 MG/ML INJ SUSP (RADIOLOG
120.0000 mg | Freq: Once | INTRAMUSCULAR | Status: AC
  Administered 2017-12-07: 120 mg via EPIDURAL

## 2017-12-07 NOTE — Discharge Instructions (Signed)

## 2017-12-15 ENCOUNTER — Other Ambulatory Visit: Payer: Self-pay | Admitting: Pediatrics

## 2017-12-15 NOTE — Telephone Encounter (Signed)
Requested medication (s) are due for refill today: yes  Requested medication (s) are on the active medication list: yes  Last refill:  09/21/17  Future visit scheduled: no  Notes to clinic:  Sent back to provider to review- muscle relaxer   Requested Prescriptions  Pending Prescriptions Disp Refills   methocarbamol (ROBAXIN) 500 MG tablet 90 tablet 0    Sig: Take 1 tablet (500 mg total) by mouth every 8 (eight) hours as needed for muscle spasms.     Not Delegated - Analgesics:  Muscle Relaxants Failed - 12/15/2017  8:46 AM      Failed - This refill cannot be delegated      Passed - Valid encounter within last 6 months    Recent Outpatient Visits          1 month ago Degenerative lumbar spinal stenosis   [facility] -Johnanna Schneiders, DO   2 months ago Spinal stenosis of lumbar region, unspecified whether neurogenic claudication present   [facility] -Johnanna Schneiders, DO   3 months ago Left hip pain   [facility] -Elam Judi Saa, DO   6 months ago Primary osteoarthritis of left hip   [facility] -Elam Judi Saa, DO   7 months ago Degenerative lumbar spinal stenosis   [facility] -Elam Judi Saa, DO

## 2017-12-15 NOTE — Telephone Encounter (Signed)
Copied from CRM [id number]. Topic: Quick Communication - Rx Refill/Question >> Dec 15, 2017  8:37 AM Herby Abraham C wrote: Medication: methocarbamol (ROBAXIN) 500 MG tablet   Has the patient contacted their pharmacy? No  (Agent: If no, request that the patient contact the pharmacy for the refill.) (Agent: If yes, when and what did the pharmacy advise?)  Preferred Pharmacy (with phone number or street name): [facility] - [place], [place] - [address]  Agent: Please be advised that RX refills may take up to 3 business days. We ask that you follow-up with your pharmacy.

## 2017-12-18 MED ORDER — METHOCARBAMOL 500 MG PO TABS: 500.0000 mg | ORAL_TABLET | Freq: Three times a day (TID) | ORAL | 0 refills | Status: DC | PRN

## 2018-01-22 ENCOUNTER — Other Ambulatory Visit: Payer: Self-pay | Admitting: Family Medicine

## 2018-01-22 DIAGNOSIS — F17201 Nicotine dependence, unspecified, in remission: Secondary | ICD-10-CM

## 2018-02-14 ENCOUNTER — Ambulatory Visit
Admission: RE | Admit: 2018-02-14 | Discharge: 2018-02-14 | Disposition: A | Discharge status: Home / Self Care | Payer: BLUE CROSS/BLUE SHIELD | Source: Ambulatory Visit | Attending: Family Medicine | Admitting: Family Medicine

## 2018-02-14 DIAGNOSIS — Z87891 Personal history of nicotine dependence: Secondary | ICD-10-CM | POA: Diagnosis not present

## 2018-02-14 DIAGNOSIS — F17201 Nicotine dependence, unspecified, in remission: Secondary | ICD-10-CM

## 2018-02-14 NOTE — Progress Notes (Signed)
[name] [address] [place], [place] [address] Phone: [phone] Subjective:    I Samantha Cox am serving as a Neurosurgeon for Dr. [name].     CC: Back and left leg pain follow-up  [id number]  Samantha Cox is a 63 y.o. female coming in with complaint of back and leg pain. States that she is still painful.  Patient was seen previously and is diagnosed with lumbar spinal stenosis.  Patient feels like it is more of the piriformis on the left side.  Patient has been having some difficulty with getting up and down and sitting for long amount of time.  No significant radiation down the leg that is long-term.  Denies any weakness.     Past Medical History:  Diagnosis Date  . History of tobacco use   . Routine general medical examination at a health care facility   . Screening for lipoid disorders    Past Surgical History:  Procedure Laterality Date  . OTHER SURGICAL HISTORY     Birthmark removed from forehead 7th grade   Social History   Socioeconomic History  . Marital status: Married    Spouse name: Not on file  . Number of children: Not on file  . Years of education: Not on file  . Highest education level: Not on file  Occupational History  . Not on file  Social Needs  . Financial resource strain: Not on file  . Food insecurity:    Worry: Not on file    Inability: Not on file  . Transportation needs:    Medical: Not on file    Non-medical: Not on file  Tobacco Use  . Smoking status: Former Games developer  . Smokeless tobacco: Never Used  Substance and Sexual Activity  . Alcohol use: Yes    Alcohol/week: 1.0 standard drinks    Types: 1 Glasses of wine per week    Frequency: Never  . Drug use: Never  . Sexual activity: Not on file  Lifestyle  . Physical activity:    Days per week: Not on file    Minutes per session: Not on file  . Stress: Not on file  Relationships  . Social connections:    Talks on phone: Not on file    Gets together: Not on file    Attends religious service: Not on file    Active member of club or organization: Not on file    Attends meetings of clubs or organizations: Not on file    Relationship status: Not on file  Other Topics Concern  . Not on file  Social History Narrative  . Not on file   Allergies  Allergen Reactions  . Augmentin [Amoxicillin-Pot Clavulanate] Diarrhea and Other (See Comments)    Felt very bad     Family History  Problem Relation Age of Onset  . Hypertension Mother   . Atrial fibrillation Father   . CVA Father 2  . Bladder Cancer Father   . Lung cancer Brother   . Diverticulitis Brother        Current Outpatient Medications (Analgesics):  .  meloxicam (MOBIC) 15 MG tablet, Take 1 tablet (15 mg total) by mouth daily.   Current Outpatient Medications (Other):  Marland Kitchen  Diclofenac Sodium 2 % SOLN, Place 2 g onto the skin 2 (two) times daily. Marland Kitchen  gabapentin (NEURONTIN) 100 MG capsule, Take 2 capsules (200 mg total) by mouth at bedtime. .  methocarbamol (ROBAXIN) 500 MG  tablet, Take 1 tablet (500 mg total) by mouth every 8 (eight) hours as needed for muscle spasms. .  Vitamin D, Ergocalciferol, (DRISDOL) 50000 units CAPS capsule, Take 1 capsule (50,000 Units total) by mouth every 7 (seven) days.    Past medical history, social, surgical and family history all reviewed in electronic medical record.  No pertanent information unless stated regarding to the chief complaint.   Review of Systems:  No headache, visual changes, nausea, vomiting, diarrhea, constipation, dizziness, abdominal pain, skin rash, fevers, chills, night sweats, weight loss, swollen lymph nodes, body aches, joint swelling, chest pain, shortness of breath, mood changes.  Positive muscle aches  Objective  Blood pressure 106/90, pulse 78, height 5\' 6"  (1.676 m), weight 174 lb (78.9 kg), SpO2 92 %.    General: No apparent distress alert and oriented x3 mood and affect normal, dressed  appropriately.  HEENT: Pupils equal, extraocular movements intact  Respiratory: Patient's speak in full sentences and does not appear short of breath  Cardiovascular: No lower extremity edema, non tender, no erythema  Skin: Warm dry intact with no signs of infection or rash on extremities or on axial skeleton.  Abdomen: Soft nontender  Neuro: Cranial nerves II through XII are intact, neurovascularly intact in all extremities with 2+ DTRs and 2+ pulses.  Lymph: No lymphadenopathy of posterior or anterior cervical chain or axillae bilaterally.  Gait normal with good balance and coordination.  MSK:  Non tender with full range of motion and good stability and symmetric strength and tone of shoulders, elbows, wrist, , knee and ankles bilaterally.  Back exam shows loss of lordosis.  Tender to palpation in the paraspinal musculature.  Patient has pain with Pearlean Brownie test on the left side.  Severe tenderness over the piriformis muscle and mild over the gluteal muscle.  Mild over the greater trochanteric area as well.  Procedure: Real-time Ultrasound Guided Injection of left piriformis tendon Device: GE Logiq Q7 Ultrasound guided injection is preferred based studies that show increased duration, increased effect, greater accuracy, decreased procedural pain, increased response rate, and decreased cost with ultrasound guided versus blind injection.  Verbal informed consent obtained.  Time-out conducted.  Noted no overlying erythema, induration, or other signs of local infection.  Skin prepped in a sterile fashion.  Local anesthesia: Topical Ethyl chloride.  With sterile technique and under real time ultrasound guidance: With a 21-gauge 2 inch needle patient was injected with 1 cc of 0.5% Marcaine and 1 cc of Kenalog 40 mg/mL into the left piriformis tendon sheath Completed without difficulty  Pain immediately resolved suggesting accurate placement of the medication.  Advised to call if fevers/chills,  erythema, induration, drainage, or persistent bleeding.  Images permanently stored and available for review in the ultrasound unit.  Impression: Technically successful ultrasound guided injection.   Impression and Recommendations:     This case required medical decision making of moderate complexity. The above documentation has been reviewed and is accurate and complete Samantha Saa, DO       Note: This dictation was prepared with Dragon dictation along with smaller phrase technology. Any transcriptional errors that result from this process are unintentional.

## 2018-02-15 ENCOUNTER — Ambulatory Visit (INDEPENDENT_AMBULATORY_CARE_PROVIDER_SITE_OTHER): Payer: BLUE CROSS/BLUE SHIELD | Admitting: Family Medicine

## 2018-02-15 ENCOUNTER — Encounter: Payer: Self-pay | Admitting: Family Medicine

## 2018-02-15 ENCOUNTER — Ambulatory Visit: Payer: Self-pay

## 2018-02-15 VITALS — BP 106/90 | HR 78 | Ht 66.0 in | Wt 174.0 lb

## 2018-02-15 DIAGNOSIS — S76312A Strain of muscle, fascia and tendon of the posterior muscle group at thigh level, left thigh, initial encounter: Secondary | ICD-10-CM | POA: Diagnosis not present

## 2018-02-15 DIAGNOSIS — G5702 Lesion of sciatic nerve, left lower limb: Secondary | ICD-10-CM

## 2018-02-15 DIAGNOSIS — M25552 Pain in left hip: Secondary | ICD-10-CM

## 2018-02-15 NOTE — Assessment & Plan Note (Signed)
Repeat injection given today.  Tolerated procedure well.  Discussed with patient that also in the differential includes gluteal tendon as well as the greater trochanteric.  I am concerned that there is possible lumbar radiculopathy again and may need a possible epidural.  Discussed with patient about taking the medications including the gabapentin on a more regular basis as well as a muscle relaxer as needed.  Patient has been somewhat noncompliant on 9.  Follow-up with me again in 4 to 8 weeks.

## 2018-02-15 NOTE — Patient Instructions (Signed)
Good to see you  Ice is your friend Lets restart the exercises again in 2 days  Injected the tendon today  See me again in 4-6 weeks

## 2018-03-19 ENCOUNTER — Ambulatory Visit: Payer: BLUE CROSS/BLUE SHIELD | Admitting: Family Medicine

## 2018-04-02 ENCOUNTER — Other Ambulatory Visit: Payer: Self-pay | Admitting: Family Medicine

## 2018-04-03 DIAGNOSIS — J301 Allergic rhinitis due to pollen: Secondary | ICD-10-CM | POA: Diagnosis not present

## 2018-04-04 ENCOUNTER — Ambulatory Visit: Payer: BLUE CROSS/BLUE SHIELD | Admitting: Family Medicine

## 2018-05-02 ENCOUNTER — Ambulatory Visit: Payer: BLUE CROSS/BLUE SHIELD | Admitting: Family Medicine

## 2018-06-04 ENCOUNTER — Ambulatory Visit: Payer: BLUE CROSS/BLUE SHIELD | Admitting: Family Medicine

## 2018-06-26 ENCOUNTER — Other Ambulatory Visit: Payer: Self-pay | Admitting: Family Medicine

## 2018-06-26 NOTE — Telephone Encounter (Signed)
Spoke with patient who is coming into office on 07/05/2018 for appointment.

## 2018-06-26 NOTE — Telephone Encounter (Signed)
Refill done.  

## 2018-07-05 ENCOUNTER — Ambulatory Visit: Payer: BLUE CROSS/BLUE SHIELD | Admitting: Family Medicine

## 2018-07-10 ENCOUNTER — Other Ambulatory Visit: Payer: Self-pay

## 2018-07-10 ENCOUNTER — Encounter: Payer: Self-pay | Admitting: Family Medicine

## 2018-07-10 ENCOUNTER — Ambulatory Visit (INDEPENDENT_AMBULATORY_CARE_PROVIDER_SITE_OTHER): Payer: BC Managed Care – PPO | Admitting: Family Medicine

## 2018-07-10 ENCOUNTER — Ambulatory Visit: Payer: Self-pay

## 2018-07-10 VITALS — BP 96/62 | HR 84 | Ht 66.0 in

## 2018-07-10 DIAGNOSIS — M25551 Pain in right hip: Secondary | ICD-10-CM

## 2018-07-10 DIAGNOSIS — M7061 Trochanteric bursitis, right hip: Secondary | ICD-10-CM | POA: Insufficient documentation

## 2018-07-10 DIAGNOSIS — M48061 Spinal stenosis, lumbar region without neurogenic claudication: Secondary | ICD-10-CM | POA: Diagnosis not present

## 2018-07-10 NOTE — Progress Notes (Signed)
[name] [address] [place], [place] [address] Phone: [phone] Subjective:   Bruce Donath, am serving as a scribe for Dr. [name].  I'm seeing this patient by the request  of:    CC: Right hip pain  OAC:[id number]     Update 07/10/2018: ROMANI DEFIORE is a 63 y.o. female coming in with complaint of right hip pain in the lower glute into the greater trochanter area. Is also having left sided lumbar spine pain. Patient has mark over the L1 area on her back. Feels sharp pain and muscular tightness in this area. Uses gabapentin 200mg  and IBU.  Patient states it is more on the right side.  Waking her up at night.  Feels very similar to the left bursitis.  Not as much in the buttocks area more on the side of the hip.  More the back pain seems to be though tightness left side worse with extension    Past Medical History:  Diagnosis Date  . History of tobacco use   . Routine general medical examination at a health care facility   . Screening for lipoid disorders    Past Surgical History:  Procedure Laterality Date  . OTHER SURGICAL HISTORY     Birthmark removed from forehead 7th grade   Social History   Socioeconomic History  . Marital status: Married    Spouse name: Not on file  . Number of children: Not on file  . Years of education: Not on file  . Highest education level: Not on file  Occupational History  . Not on file  Social Needs  . Financial resource strain: Not on file  . Food insecurity:    Worry: Not on file    Inability: Not on file  . Transportation needs:    Medical: Not on file    Non-medical: Not on file  Tobacco Use  . Smoking status: Former Games developer  . Smokeless tobacco: Never Used  Substance and Sexual Activity  . Alcohol use: Yes    Alcohol/week: 1.0 standard drinks    Types: 1 Glasses of wine per week    Frequency: Never  . Drug use: Never  . Sexual activity: Not on file  Lifestyle  . Physical  activity:    Days per week: Not on file    Minutes per session: Not on file  . Stress: Not on file  Relationships  . Social connections:    Talks on phone: Not on file    Gets together: Not on file    Attends religious service: Not on file    Active member of club or organization: Not on file    Attends meetings of clubs or organizations: Not on file    Relationship status: Not on file  Other Topics Concern  . Not on file  Social History Narrative  . Not on file   Allergies  Allergen Reactions  . Augmentin [Amoxicillin-Pot Clavulanate] Diarrhea and Other (See Comments)    Felt very bad     Family History  Problem Relation Age of Onset  . Hypertension Mother   . Atrial fibrillation Father   . CVA Father 32  . Bladder Cancer Father   . Lung cancer Brother   . Diverticulitis Brother        Current Outpatient Medications (Analgesics):  .  meloxicam (MOBIC) 15 MG tablet, Take 1 tablet (15 mg total) by mouth daily.   Current Outpatient Medications (Other):  .  Diclofenac Sodium 2 % SOLN, Place 2 g onto the skin 2 (two) times daily. Marland Kitchen.  gabapentin (NEURONTIN) 100 MG capsule, TAKE 2 CAPSULES AT BEDTIME. .  methocarbamol (ROBAXIN) 500 MG tablet, TAKE 1 TABLET EVERY 8 HOURS AS NEEDED FOR MUSCLE SPASM. Marland Kitchen.  Vitamin D, Ergocalciferol, (DRISDOL) 50000 units CAPS capsule, Take 1 capsule (50,000 Units total) by mouth every 7 (seven) days.    Past medical history, social, surgical and family history all reviewed in electronic medical record.  No pertanent information unless stated regarding to the chief complaint.   Review of Systems:  No headache, visual changes, nausea, vomiting, diarrhea, constipation, dizziness, abdominal pain, skin rash, fevers, chills, night sweats, weight loss, swollen lymph nodes, body aches, joint swelling, muscle aches, chest pain, shortness of breath, mood changes.   Objective  Blood pressure 96/62, pulse 84, height 5\' 6"  (1.676 m), SpO2 94 %. Systems  examined below as of    General: No apparent distress alert and oriented x3 mood and affect normal, dressed appropriately.  HEENT: Pupils equal, extraocular movements intact  Respiratory: Patient's speak in full sentences and does not appear short of breath  Cardiovascular: No lower extremity edema, non tender, no erythema  Skin: Warm dry intact with no signs of infection or rash on extremities or on axial skeleton.  Abdomen: Soft nontender  Neuro: Cranial nerves II through XII are intact, neurovascularly intact in all extremities with 2+ DTRs and 2+ pulses.  Lymph: No lymphadenopathy of posterior or anterior cervical chain or axillae bilaterally.  Gait mild antalgic MSK:  Non tender with full range of motion and good stability and symmetric strength and tone of shoulders, elbows, wrist, , knee and ankles bilaterally.  Neck exam does have loss of lordosis.  Tightness in all planes.  Tender to palpation in the paraspinal musculature with worsening pain with extension especially at L4-L5 on the left side of the back.  No spinous process tenderness.  5-5 strength of the lower extremities.  Tightness of the hips bilaterally with decreased range of motion internally left greater than right.  Severe tenderness to palpation of the right greater trochanteric area.  Negative straight leg test at the moment though.   Procedure: Real-time Ultrasound Guided Injection of right greater trochanteric bursitis secondary to patient's body habitus Device: GE Logiq Q7 Ultrasound guided injection is preferred based studies that show increased duration, increased effect, greater accuracy, decreased procedural pain, increased response rate, and decreased cost with ultrasound guided versus blind injection.  Verbal informed consent obtained.  Time-out conducted.  Noted no overlying erythema, induration, or other signs of local infection.  Skin prepped in a sterile fashion.  Local anesthesia: Topical Ethyl chloride.   With sterile technique and under real time ultrasound guidance:  Greater trochanteric area was visualized and patient's bursa was noted. A 22-gauge 3 inch needle was inserted and 4 cc of 0.5% Marcaine and 1 cc of Kenalog 40 mg/dL was injected. Pictures taken Completed without difficulty  Pain immediately resolved suggesting accurate placement of the medication.  Advised to call if fevers/chills, erythema, induration, drainage, or persistent bleeding.  Images permanently stored and available for review in the ultrasound unit.  Impression: Technically successful ultrasound guided injection.    Impression and Recommendations:     This case required medical decision making of moderate complexity. The above documentation has been reviewed and is accurate and complete Judi SaaZachary M Kessie Croston, DO       Note: This dictation was prepared with Dragon dictation along with  smaller phrase technology. Any transcriptional errors that result from this process are unintentional.

## 2018-07-10 NOTE — Assessment & Plan Note (Addendum)
Injected July 10 2018 Discussed HEP  Discussed could be secondary to radicular symptoms coming from the back.  Patient is going to continue with the medications.  If no improvement I would like patient to have a facet injection of the left L4 area.  Does have lumbar spinal stenosis as well and has responded to epidurals previously.  Patient will consider this.

## 2018-07-10 NOTE — Patient Instructions (Addendum)
Good to see you  Ice 20 minutes 2 times daily. Usually after activity and before bed. Injected the side of the hip  I think some of the back pain is the facet arthropathy. If not better in 2 weeks call me and we will order the injections.  Otherwise see me when you need me

## 2018-08-08 DIAGNOSIS — J0100 Acute maxillary sinusitis, unspecified: Secondary | ICD-10-CM | POA: Diagnosis not present

## 2018-10-04 DIAGNOSIS — Z87410 Personal history of cervical dysplasia: Secondary | ICD-10-CM | POA: Diagnosis not present

## 2018-10-04 DIAGNOSIS — N870 Mild cervical dysplasia: Secondary | ICD-10-CM | POA: Diagnosis not present

## 2018-10-04 DIAGNOSIS — Z01419 Encounter for gynecological examination (general) (routine) without abnormal findings: Secondary | ICD-10-CM | POA: Diagnosis not present

## 2018-10-11 ENCOUNTER — Other Ambulatory Visit: Payer: Self-pay | Admitting: Family Medicine

## 2018-10-11 DIAGNOSIS — R0981 Nasal congestion: Secondary | ICD-10-CM

## 2018-10-17 ENCOUNTER — Ambulatory Visit: Payer: BC Managed Care – PPO | Admitting: Family Medicine

## 2018-10-18 DIAGNOSIS — H43813 Vitreous degeneration, bilateral: Secondary | ICD-10-CM | POA: Diagnosis not present

## 2018-10-18 NOTE — Progress Notes (Signed)
Samantha ScaleZach Cox D.O. [facility] [address] [place], [place] [address] Phone: [phone] Subjective:   I Samantha NighKana Cox am serving as a Neurosurgeonscribe for Dr. Antoine PrimasZachary Cox.  I'm seeing this patient by the request  of:    CC: Neck pain, hip pain follow-up  ZHY:[id number]:Subjective   07/10/2018 Injected July 10 2018 Discussed HEP  Discussed could be secondary to radicular symptoms coming from the back.  Patient is going to continue with the medications.  If no improvement I would like patient to have a facet injection of the left L4 area.  Does have lumbar spinal stenosis as well and has responded to epidurals previously.  Patient will consider this.  10/19/2018 Samantha HoitNancy B Cox is a 63 y.o. female coming in with complaint of right hip and back pain. Pain varies. Sunday after being on her feet she reached a point to where she couldn't move.  Discussed which activities of doing which wants to avoid.  Tightness on the right     Past Medical History:  Diagnosis Date  . History of tobacco use   . Routine general medical examination at a health care facility   . Screening for lipoid disorders    Past Surgical History:  Procedure Laterality Date  . OTHER SURGICAL HISTORY     Birthmark removed from forehead 7th grade   Social History   Socioeconomic History  . Marital status: Married    Spouse name: Not on file  . Number of children: Not on file  . Years of education: Not on file  . Highest education level: Not on file  Occupational History  . Not on file  Social Needs  . Financial resource strain: Not on file  . Food insecurity    Worry: Not on file    Inability: Not on file  . Transportation needs    Medical: Not on file    Non-medical: Not on file  Tobacco Use  . Smoking status: Former Games developermoker  . Smokeless tobacco: Never Used  Substance and Sexual Activity  . Alcohol use: Yes    Alcohol/week: 1.0 standard drinks    Types: 1 Glasses of wine per week    Frequency: Never  .  Drug use: Never  . Sexual activity: Not on file  Lifestyle  . Physical activity    Days per week: Not on file    Minutes per session: Not on file  . Stress: Not on file  Relationships  . Social Musicianconnections    Talks on phone: Not on file    Gets together: Not on file    Attends religious service: Not on file    Active member of club or organization: Not on file    Attends meetings of clubs or organizations: Not on file    Relationship status: Not on file  Other Topics Concern  . Not on file  Social History Narrative  . Not on file   Allergies  Allergen Reactions  . Augmentin [Amoxicillin-Pot Clavulanate] Diarrhea and Other (See Comments)    Felt very bad     Family History  Problem Relation Age of Onset  . Hypertension Mother   . Atrial fibrillation Father   . CVA Father [id number]  . Bladder Cancer Father   . Lung cancer Brother   . Diverticulitis Brother          Current Outpatient Medications (Other):  .  methocarbamol (ROBAXIN) 500 MG tablet, TAKE 1 TABLET EVERY 8 HOURS AS NEEDED FOR MUSCLE  SPASM. Marland Kitchen  Vitamin D, Ergocalciferol, (DRISDOL) 50000 units CAPS capsule, Take 1 capsule (50,000 Units total) by mouth every 7 (seven) days.    Past medical history, social, surgical and family history all reviewed in electronic medical record.  No pertanent information unless stated regarding to the chief complaint.   Review of Systems:  No headache, visual changes, nausea, vomiting, diarrhea, constipation, dizziness, abdominal pain, skin rash, fevers, chills, night sweats, weight loss, swollen lymph nodes, body aches, joint swelling,  chest pain, shortness of breath, mood changes.  Positive muscle aches  Objective  Blood pressure 130/90, pulse 86, height 5\' 6"  (1.676 m), weight 170 lb (77.1 kg), SpO2 97 %.    General: No apparent distress alert and oriented x3 mood and affect normal, dressed appropriately.  HEENT: Pupils equal, extraocular movements intact  Respiratory:  Patient's speak in full sentences and does not appear short of breath  Cardiovascular: No lower extremity edema, non tender, no erythema  Skin: Warm dry intact with no signs of infection or rash on extremities or on axial skeleton.  Abdomen: Soft nontender  Neuro: Cranial nerves II through XII are intact, neurovascularly intact in all extremities with 2+ DTRs and 2+ pulses.  Lymph: No lymphadenopathy of posterior or anterior cervical chain or axillae bilaterally.  Gait normal with good balance and coordination.  MSK:  tender with limited range of motion and good stability and symmetric strength and tone of shoulders, elbows, wrist,  knee and ankles bilaterally.  Hip exam shows the patient is tender to palpation over the greater trochanteric area on the right side.  Back exam has loss of lordosis with tightness of straight leg test with mild radicular symptoms down the left leg.  Pain more in the paraspinal musculature of the lumbar spine left greater than right.   Procedure: Real-time Ultrasound Guided Injection of right greater trochanteric bursitis secondary to patient's body habitus Device: GE Logiq Q7 Ultrasound guided injection is preferred based studies that show increased duration, increased effect, greater accuracy, decreased procedural pain, increased response rate, and decreased cost with ultrasound guided versus blind injection.  Verbal informed consent obtained.  Time-out conducted.  Noted no overlying erythema, induration, or other signs of local infection.  Skin prepped in a sterile fashion.  Local anesthesia: Topical Ethyl chloride.  With sterile technique and under real time ultrasound guidance:  Greater trochanteric area was visualized and patient's bursa was noted. A 22-gauge 3 inch needle was inserted and 4 cc of 0.5% Marcaine and 1 cc of Kenalog 40 mg/dL was injected. Pictures taken Completed without difficulty  Pain immediately resolved suggesting accurate placement of the  medication.  Advised to call if fevers/chills, erythema, induration, drainage, or persistent bleeding.  Images permanently stored and available for review in the ultrasound unit.  Impression: Technically successful ultrasound guided injection.    Impression and Recommendations:     This case required medical decision making of moderate complexity. The above documentation has been reviewed and is accurate and complete Lyndal Pulley, DO       Note: This dictation was prepared with Dragon dictation along with smaller phrase technology. Any transcriptional errors that result from this process are unintentional.

## 2018-10-19 ENCOUNTER — Ambulatory Visit (INDEPENDENT_AMBULATORY_CARE_PROVIDER_SITE_OTHER): Payer: BC Managed Care – PPO | Admitting: Family Medicine

## 2018-10-19 ENCOUNTER — Other Ambulatory Visit: Payer: Self-pay

## 2018-10-19 ENCOUNTER — Ambulatory Visit: Payer: Self-pay

## 2018-10-19 ENCOUNTER — Encounter: Payer: Self-pay | Admitting: Family Medicine

## 2018-10-19 VITALS — BP 130/90 | HR 86 | Ht 66.0 in | Wt 170.0 lb

## 2018-10-19 DIAGNOSIS — M48061 Spinal stenosis, lumbar region without neurogenic claudication: Secondary | ICD-10-CM | POA: Diagnosis not present

## 2018-10-19 DIAGNOSIS — M25551 Pain in right hip: Secondary | ICD-10-CM | POA: Diagnosis not present

## 2018-10-19 DIAGNOSIS — G8929 Other chronic pain: Secondary | ICD-10-CM

## 2018-10-19 DIAGNOSIS — M7061 Trochanteric bursitis, right hip: Secondary | ICD-10-CM | POA: Diagnosis not present

## 2018-10-19 NOTE — Assessment & Plan Note (Addendum)
Stable, discussed posture activity which was avoided.  Follow-up again in 4 to 8 weeks will be reordering the epidural to have done the patient is responding well.  For nearly a year.

## 2018-10-19 NOTE — Patient Instructions (Addendum)
PT will call you [facility] imaging will call you about epidural or you can call them [phone] Exercise 3 times a week See me again in 6 weeks

## 2018-10-19 NOTE — Assessment & Plan Note (Signed)
Injected agaion today discussed with patient again agrees.  Discussed which activities which also avoid.  Discussed posture and ergonomics.  I do believe that spinal stenosis is likely contributing.  Follow-up again in 4 to 8 weeks

## 2018-10-30 ENCOUNTER — Ambulatory Visit
Admission: RE | Admit: 2018-10-30 | Discharge: 2018-10-30 | Disposition: A | Discharge status: Home / Self Care | Payer: BC Managed Care – PPO | Source: Ambulatory Visit | Attending: Family Medicine | Admitting: Family Medicine

## 2018-10-30 ENCOUNTER — Other Ambulatory Visit: Payer: Self-pay

## 2018-10-30 DIAGNOSIS — J342 Deviated nasal septum: Secondary | ICD-10-CM | POA: Diagnosis not present

## 2018-10-30 DIAGNOSIS — R0981 Nasal congestion: Secondary | ICD-10-CM

## 2018-10-31 ENCOUNTER — Ambulatory Visit
Admission: RE | Admit: 2018-10-31 | Discharge: 2018-10-31 | Disposition: A | Discharge status: Home / Self Care | Payer: BC Managed Care – PPO | Source: Ambulatory Visit | Attending: Family Medicine | Admitting: Family Medicine

## 2018-10-31 DIAGNOSIS — G8929 Other chronic pain: Secondary | ICD-10-CM | POA: Diagnosis not present

## 2018-10-31 DIAGNOSIS — M545 Low back pain: Secondary | ICD-10-CM | POA: Diagnosis not present

## 2018-10-31 MED ORDER — METHYLPREDNISOLONE ACETATE 40 MG/ML INJ SUSP (RADIOLOG
120.0000 mg | Freq: Once | INTRAMUSCULAR | Status: AC
  Administered 2018-10-31: 120 mg via EPIDURAL

## 2018-10-31 MED ORDER — IOPAMIDOL (ISOVUE-M 200) INJECTION 41%
1.0000 mL | Freq: Once | INTRAMUSCULAR | Status: AC
  Administered 2018-10-31: 1 mL via EPIDURAL

## 2018-10-31 NOTE — Discharge Instructions (Signed)

## 2018-11-06 ENCOUNTER — Other Ambulatory Visit: Payer: Self-pay | Admitting: Family Medicine

## 2018-11-06 MED ORDER — METHOCARBAMOL 500 MG PO TABS: 500.0000 mg | ORAL_TABLET | Freq: Three times a day (TID) | ORAL | 0 refills | Status: DC | PRN

## 2018-11-20 DIAGNOSIS — F4323 Adjustment disorder with mixed anxiety and depressed mood: Secondary | ICD-10-CM | POA: Diagnosis not present

## 2018-11-22 DIAGNOSIS — R29890 Loss of height: Secondary | ICD-10-CM | POA: Diagnosis not present

## 2018-11-22 DIAGNOSIS — R739 Hyperglycemia, unspecified: Secondary | ICD-10-CM | POA: Diagnosis not present

## 2018-11-22 DIAGNOSIS — M5489 Other dorsalgia: Secondary | ICD-10-CM | POA: Diagnosis not present

## 2018-11-22 DIAGNOSIS — D72829 Elevated white blood cell count, unspecified: Secondary | ICD-10-CM | POA: Diagnosis not present

## 2018-11-23 DIAGNOSIS — H43812 Vitreous degeneration, left eye: Secondary | ICD-10-CM | POA: Diagnosis not present

## 2018-11-26 ENCOUNTER — Ambulatory Visit: Payer: BC Managed Care – PPO | Admitting: Family Medicine

## 2018-11-26 DIAGNOSIS — F4323 Adjustment disorder with mixed anxiety and depressed mood: Secondary | ICD-10-CM | POA: Diagnosis not present

## 2018-12-05 DIAGNOSIS — F4323 Adjustment disorder with mixed anxiety and depressed mood: Secondary | ICD-10-CM | POA: Diagnosis not present

## 2018-12-19 ENCOUNTER — Ambulatory Visit (INDEPENDENT_AMBULATORY_CARE_PROVIDER_SITE_OTHER): Payer: BLUE CROSS/BLUE SHIELD | Admitting: Allergy

## 2018-12-19 ENCOUNTER — Other Ambulatory Visit: Payer: Self-pay

## 2018-12-19 ENCOUNTER — Encounter: Payer: Self-pay | Admitting: Allergy

## 2018-12-19 VITALS — BP 142/88 | HR 80 | Temp 98.0°F | Resp 18 | Ht 65.3 in | Wt 170.4 lb

## 2018-12-19 DIAGNOSIS — J310 Chronic rhinitis: Secondary | ICD-10-CM | POA: Diagnosis not present

## 2018-12-19 DIAGNOSIS — F4323 Adjustment disorder with mixed anxiety and depressed mood: Secondary | ICD-10-CM | POA: Diagnosis not present

## 2018-12-19 DIAGNOSIS — Z87891 Personal history of nicotine dependence: Secondary | ICD-10-CM | POA: Diagnosis not present

## 2018-12-19 DIAGNOSIS — R0602 Shortness of breath: Secondary | ICD-10-CM | POA: Diagnosis not present

## 2018-12-19 MED ORDER — XHANCE 93 MCG/ACT NA EXHU: 2.0000 | INHALANT_SUSPENSION | Freq: Two times a day (BID) | NASAL | 5 refills | Status: DC

## 2018-12-19 NOTE — Assessment & Plan Note (Signed)
Patient complains of inability to take a full breath and wheezing at times for the past year.  She used to smoke 1 pack/day for 40 years and quit in 2015.  However she is currently vaping.  Today's spirometry was normal with no improvement in FEV1 post bronchodilator treatment.  Advised patient to monitor symptoms.  Try to wean off vaping.   If still having issues, may need to get the full body PFTs.

## 2018-12-19 NOTE — Progress Notes (Signed)
New Patient Note  RE: Samantha Cox MRN: [id number] DOB: January 16, 1956 Date of Office Visit: 12/19/2018  Referring provider: Harlan Stains, MD Primary care provider: Harlan Stains, MD  Chief Complaint: Nasal Congestion  History of Present Illness: I had the pleasure of seeing Samantha Cox for initial evaluation at the [facility] on 12/19/2018. She is a 63 y.o. female, who is referred here by Harlan Stains, MD for the evaluation of nasal congestion.   She reports symptoms of nasal congestion, rhinorrhea, sneezing, itchy/watery eyes, left sided headaches/ear pain, pressure. Symptoms have been going on for 5 years but the left sided symptoms for the past 6+ months. The symptoms are present all year around with worsening in spring. Other triggers include exposure to unknown. Anosmia: yes. Headache: yes. She has used antibiotics with some improvement in symptoms. She also tried Flonase, Singulair with some benefit. Sinus infections: a few this year which improved after antibiotics. Previous work up includes: none. Previous ENT evaluation: a few years ago but not recently, has ENT appointment in [place]. Last eye exam: within the past year for left eye floaters.  History of nasal polyps: no.  10/30/2018 CT sinus IMPRESSION: The paranasal sinuses are well aerated.  Leftward deviation of the bony nasal septum with leftward bony spurring.  Mild to moderate generalized cerebral atrophy.  Assessment and Plan: Samantha Cox is a 63 y.o. female with: Chronic rhinitis Perennial rhinoconjunctivitis symptoms for the past 5 years with worsening the spring.  Noted worsening symptoms especially on the left side for the past 6+ months.  Tried Flonase, Singulair and antibiotics with some benefit.  Patient has ENT appointment in January.  CT sinuses showed deviated septum. Had recent eye exam due to ? floaters.   Today's skin testing showed: Negative to environmental allergies.  Will  double check with bloodwork.   Discussed with patient that it's unusual for allergies to affect only one side of the sinuses but the deviated septum is most likely contributing to her symptoms.   Recommend starting Xhance 2 sprays per nostril twice a day for the nasal congestion. Sample given. Demonstrated proper use.   Nasal saline spray (i.e., Simply Saline) or nasal saline lavage (i.e., NeilMed) is recommended as needed and prior to medicated nasal sprays.  Follow up with ENT in January as scheduled.  Shortness of breath Patient complains of inability to take a full breath and wheezing at times for the past year.  She used to smoke 1 pack/day for 40 years and quit in 2015.  However she is currently vaping.  Today's spirometry was normal with no improvement in FEV1 post bronchodilator treatment.  Advised patient to monitor symptoms.  Try to wean off vaping.   If still having issues, may need to get the full body PFTs.    Ex-cigarette smoker  See assessment and plan as above for shortness of breath.  Return in about 3 months (around 03/21/2019).  Meds ordered this encounter  Medications  . Fluticasone Propionate (XHANCE) 93 MCG/ACT EXHU    Sig: Place 2 sprays into both nostrils 2 (two) times daily.    Dispense:  32 mL    Refill:  5    Lab Orders     Allergens w/Total IgE Area 2     Allergen, Horse dander,e3  Other allergy screening: Asthma: no  Some issues with difficulty to taking a full breath and wheezing at times for the past year Food allergy: no Medication allergy: no  Augmentin -  GI issues Hymenoptera allergy: no Urticaria: no Eczema:no History of recurrent infections suggestive of immunodeficency: no  Diagnostics: Spirometry:  Tracings reviewed. Her effort: Good reproducible efforts. FVC: 3.20L FEV1: 2.57L, 97% predicted FEV1/FVC ratio: 80% Interpretation: Spirometry consistent with normal pattern with no improvement in FEV1 post bronchodilator  treatment. Please see scanned spirometry results for details.  Skin Testing: Environmental allergy panel. Negative test to: environmental allergies.  Results discussed with patient/family. Airborne Adult Perc - 12/19/18 1004    Time Antigen Placed  [id number]    Allergen Manufacturer  Waynette ButteryGreer    Location  Back    Number of Test  59    Panel 1  Select    1. Control-Buffer 50% Glycerol  Negative    2. Control-Histamine 1 mg/ml  2+    3. Albumin saline  Negative    4. Bahia  Negative    5. [place] Southern Territ[place]  Negative    6. Johnson  Negative    7. Kentucky Blue  Negative    8. Meadow Fescue  Negative    9. Perennial Rye  Negative    10. Sweet Vernal  Negative    11. Timothy  Negative    12. Cocklebur  Negative    13. Burweed Marshelder  Negative    14. Ragweed, short  Negative    15. Ragweed, Giant  Negative    16. Plantain,  English  Negative    17. Lamb's Quarters  Negative    18. Sheep Sorrell  Negative    19. Rough Pigweed  Negative    20. Marsh Elder, Rough  Negative    21. Mugwort, Common  Negative    22. Ash mix  Negative    23. Birch mix  Negative    24. Beech American  Negative    25. Box, Elder  Negative    26. Cedar, red  Negative    27. Cottonwood, Guinea-Bissau[place]astern  Negative    28. Elm mix  Negative    29. Hickory mix  Negative    30. Maple mix  Negative    31. Oak, Guinea-Bissau[place]astern mix  Negative    32. Pecan Pollen  Negative    33. Pine mix  Negative    34. Sycamore Eastern  Negative    35. Walnut, Black Pollen  Negative    36. Alternaria alternata  Negative    37. Cladosporium Herbarum  Negative    38. Aspergillus mix  Negative    39. Penicillium mix  Negative    40. Bipolaris sorokiniana (Helminthosporium)  Negative    41. Drechslera spicifera (Curvularia)  Negative    42. Mucor plumbeus  Negative    43. Fusarium moniliforme  Negative    44. Aureobasidium pullulans (pullulara)  Negative    45. Rhizopus oryzae  Negative    46. Botrytis cinera  Negative    47. Epicoccum nigrum   Negative    48. Phoma betae  Negative    49. Candida Albicans  Negative    50. Trichophyton mentagrophytes  Negative    51. Mite, D Farinae  5,000 AU/ml  Negative    52. Mite, D Pteronyssinus  5,000 AU/ml  Negative    53. Cat Hair 10,000 BAU/ml  Negative    54.  Dog Epithelia  Negative    55. Mixed Feathers  Negative    56. Horse Epithelia  Negative    57. Cockroach, German  Negative    58. Mouse  Negative    59. Tobacco  Leaf  Negative       Past Medical History: Patient Active Problem List   Diagnosis Date Noted  . Chronic rhinitis 12/19/2018  . Shortness of breath 12/19/2018  . Ex-cigarette smoker 12/19/2018  . Greater trochanteric bursitis of right hip 07/10/2018  . Strain of left piriformis muscle 02/15/2018  . Degenerative lumbar spinal stenosis 10/17/2016  . Leg cramping 09/19/2016  . Morton's neuroma of left foot 05/24/2016  . Piriformis syndrome of left side 05/13/2015  . Greater trochanteric bursitis of left hip 11/25/2014  . Gluteal tendinitis of left buttock 11/25/2014  . Degenerative arthritis of hip 01/16/2014  . Acetabular labrum tear 01/16/2014   Past Medical History:  Diagnosis Date  . History of tobacco use   . Routine general medical examination at a health care facility   . Screening for lipoid disorders    Past Surgical History: Past Surgical History:  Procedure Laterality Date  . OTHER SURGICAL HISTORY     Birthmark removed from forehead 7th grade   Medication List:  Current Outpatient Medications  Medication Sig Dispense Refill  . Melatonin 1 MG TABS Take by mouth.    . methocarbamol (ROBAXIN) 500 MG tablet Take 1 tablet (500 mg total) by mouth 3 (three) times daily as needed for muscle spasms. 90 tablet 0  . Multiple Vitamin (MULTI-VITAMIN) tablet Take by mouth.    . traZODone (DESYREL) 50 MG tablet at bedtime.    . Vitamin D, Ergocalciferol, (DRISDOL) 50000 units CAPS capsule Take 1 capsule (50,000 Units total) by mouth every 7 (seven) days.  12 capsule 0  . Fluticasone Propionate (XHANCE) 93 MCG/ACT EXHU Place 2 sprays into both nostrils 2 (two) times daily. 32 mL 5   No current facility-administered medications for this visit.    Allergies: Allergies  Allergen Reactions  . Augmentin [Amoxicillin-Pot Clavulanate] Diarrhea and Other (See Comments)    Felt very bad     Social History: Social History   Socioeconomic History  . Marital status: Married    Spouse name: Not on file  . Number of children: Not on file  . Years of education: Not on file  . Highest education level: Not on file  Occupational History  . Not on file  Social Needs  . Financial resource strain: Not on file  . Food insecurity    Worry: Not on file    Inability: Not on file  . Transportation needs    Medical: Not on file    Non-medical: Not on file  Tobacco Use  . Smoking status: Former Smoker    Packs/day: 1.00    Years: 40.00    Pack years: 40.00    Types: Cigarettes    Quit date: 2015    Years since quitting: 5.8  . Smokeless tobacco: Never Used  Substance and Sexual Activity  . Alcohol use: Yes    Alcohol/week: 1.0 standard drinks    Types: 1 Glasses of wine per week    Frequency: Never  . Drug use: Never  . Sexual activity: Not on file  Lifestyle  . Physical activity    Days per week: Not on file    Minutes per session: Not on file  . Stress: Not on file  Relationships  . Social Musician on phone: Not on file    Gets together: Not on file    Attends religious service: Not on file    Active member of club or organization: Not on file  Attends meetings of clubs or organizations: Not on file    Relationship status: Not on file  Other Topics Concern  . Not on file  Social History Narrative  . Not on file   Lives in a 61 year old home. Smoking: quit in 2015 but uses e-cigarettes daily Occupation: Astronomer History: Water Damage/mildew in the house: yes in Advice worker in the family  room: yes Carpet in the bedroom: yes Heating: heat pump Cooling: central Pet: yes 2 dogs, 2 cats, 12 horses outdoors  Family History: Family History  Problem Relation Age of Onset  . Hypertension Mother   . Asthma Mother   . Atrial fibrillation Father   . CVA Father 49  . Bladder Cancer Father   . Lung cancer Brother   . Diverticulitis Brother    Review of Systems  Constitutional: Negative for appetite change, chills, fever and unexpected weight change.  HENT: Positive for congestion, rhinorrhea and sinus pressure.   Eyes: Negative for itching.  Respiratory: Positive for shortness of breath and wheezing. Negative for cough and chest tightness.   Cardiovascular: Negative for chest pain.  Gastrointestinal: Negative for abdominal pain.  Genitourinary: Negative for difficulty urinating.  Skin: Negative for rash.  Allergic/Immunologic: Negative for environmental allergies and food allergies.  Neurological: Positive for headaches.   Objective: BP (!) 142/88   Pulse 80   Temp 98 F (36.7 C) (Temporal)   Resp 18   Ht 5' 5.3" (1.659 m)   Wt 170 lb 6.4 oz (77.3 kg)   SpO2 97%   BMI 28.10 kg/m  Body mass index is 28.1 kg/m. Physical Exam  Constitutional: She is oriented to person, place, and time. She appears well-developed and well-nourished.  HENT:  Head: Normocephalic and atraumatic.  Right Ear: External ear normal.  Left Ear: External ear normal.  Nose: Nose normal.  Mouth/Throat: Oropharynx is clear and moist.  Eyes: Conjunctivae and EOM are normal.  Neck: Neck supple.  Cardiovascular: Normal rate, regular rhythm and normal heart sounds. Exam reveals no gallop and no friction rub.  No murmur heard. Pulmonary/Chest: Effort normal and breath sounds normal. She has no wheezes. She has no rales.  Abdominal: Soft.  Neurological: She is alert and oriented to person, place, and time.  Skin: Skin is warm. No rash noted.  Psychiatric: She has a normal mood and affect. Her  behavior is normal.  Nursing note and vitals reviewed.  The plan was reviewed with the patient/family, and all questions/concerned were addressed.  It was my pleasure to see Amelianna today and participate in her care. Please feel free to contact me with any questions or concerns.  Sincerely,  Wyline Mood, DO Allergy & Immunology  [facility] office: [phone] [facility] office: [phone] [place] office: [phone]

## 2018-12-19 NOTE — Patient Instructions (Addendum)
Today's skin testing showed: Negative to environmental allergies. Will double check with bloodwork.   . Get bloodwork:  o We are ordering labs, so please allow 1-2 weeks for the results to come back. o With the newly implemented Cures Act, the labs might be visible to you at the same time that they become visible to me. However, I will not address the results until all of the results are back, so please be patient.   Today's breathing test looked normal. Monitor symptoms. Try to wean off the vaping. If still having issues, may need to get the full body PFTs as we discussed.   It's unusual for allergies to affect only one side but you do have the deviated septum which may be making your symptoms worse.  Recommend starting Xhance 2 sprays per nostril twice a day for the nasal congestion. Sample given.  Nasal saline spray (i.e., Simply Saline) or nasal saline lavage (i.e., NeilMed) is recommended as needed and prior to medicated nasal sprays.  Follow up with your ENT in January as scheduled.  Follow up with me in 3 months or sooner if needed.

## 2018-12-19 NOTE — Assessment & Plan Note (Signed)
   See assessment and plan as above for shortness of breath.  

## 2018-12-19 NOTE — Assessment & Plan Note (Signed)
Perennial rhinoconjunctivitis symptoms for the past 5 years with worsening the spring.  Noted worsening symptoms especially on the left side for the past 6+ months.  Tried Flonase, Singulair and antibiotics with some benefit.  Patient has ENT appointment in January.  CT sinuses showed deviated septum. Had recent eye exam due to ? floaters.   Today's skin testing showed: Negative to environmental allergies.  Will double check with bloodwork.   Discussed with patient that it's unusual for allergies to affect only one side of the sinuses but the deviated septum is most likely contributing to her symptoms.   Recommend starting Xhance 2 sprays per nostril twice a day for the nasal congestion. Sample given. Demonstrated proper use.   Nasal saline spray (i.e., Simply Saline) or nasal saline lavage (i.e., NeilMed) is recommended as needed and prior to medicated nasal sprays.  Follow up with ENT in January as scheduled.

## 2018-12-21 LAB — ALLERGENS W/TOTAL IGE AREA 2
Alternaria Alternata IgE: <0.1 kU/L
Aspergillus Fumigatus IgE: <0.1 kU/L
Bermuda Grass IgE: <0.1 kU/L
Cat Dander IgE: 0.24 kU/L — AB
Cedar, Mountain IgE: 0.1 kU/L — AB
Cladosporium Herbarum IgE: <0.1 kU/L
Cockroach, German IgE: 0.13 kU/L — AB
Common Silver Birch IgE: <0.1 kU/L
Cottonwood IgE: <0.1 kU/L
D Farinae IgE: <0.1 kU/L
D Pteronyssinus IgE: 0.13 kU/L — AB
Dog Dander IgE: 1.77 kU/L — AB
Elm, American IgE: <0.1 kU/L
IgE (Immunoglobulin E), Serum: 771 IU/mL — ABNORMAL HIGH (ref 6–495)
Johnson Grass IgE: 0.12 kU/L — AB
Maple/Box Elder IgE: <0.1 kU/L
Mouse Urine IgE: <0.1 kU/L
Oak, White IgE: <0.1 kU/L
Pecan, Hickory IgE: <0.1 kU/L
Penicillium Chrysogen IgE: <0.1 kU/L
Pigweed, Rough IgE: <0.1 kU/L
Ragweed, Short IgE: 0.11 kU/L — AB
Sheep Sorrel IgE Qn: <0.1 kU/L
Timothy Grass IgE: <0.1 kU/L
White Mulberry IgE: <0.1 kU/L

## 2018-12-21 LAB — ALLERGEN, HORSE DANDER,E3: Horse dander: 0.16 kU/L — AB

## 2018-12-22 DIAGNOSIS — F3289 Other specified depressive episodes: Secondary | ICD-10-CM | POA: Diagnosis not present

## 2018-12-24 ENCOUNTER — Encounter: Payer: Self-pay | Admitting: *Deleted

## 2018-12-25 DIAGNOSIS — F3289 Other specified depressive episodes: Secondary | ICD-10-CM | POA: Diagnosis not present

## 2018-12-31 DIAGNOSIS — F4323 Adjustment disorder with mixed anxiety and depressed mood: Secondary | ICD-10-CM | POA: Diagnosis not present

## 2019-01-10 DIAGNOSIS — I700 Atherosclerosis of aorta: Secondary | ICD-10-CM | POA: Diagnosis not present

## 2019-01-10 DIAGNOSIS — F5101 Primary insomnia: Secondary | ICD-10-CM | POA: Diagnosis not present

## 2019-01-10 DIAGNOSIS — E559 Vitamin D deficiency, unspecified: Secondary | ICD-10-CM | POA: Diagnosis not present

## 2019-01-10 DIAGNOSIS — J449 Chronic obstructive pulmonary disease, unspecified: Secondary | ICD-10-CM | POA: Diagnosis not present

## 2019-01-10 DIAGNOSIS — Z0000 Encounter for general adult medical examination without abnormal findings: Secondary | ICD-10-CM | POA: Diagnosis not present

## 2019-01-15 DIAGNOSIS — Z1159 Encounter for screening for other viral diseases: Secondary | ICD-10-CM | POA: Diagnosis not present

## 2019-01-16 ENCOUNTER — Encounter (HOSPITAL_COMMUNITY): Payer: Self-pay

## 2019-01-16 ENCOUNTER — Emergency Department (HOSPITAL_COMMUNITY)
Admission: EM | Admit: 2019-01-16 | Discharge: 2019-01-17 | Disposition: A | Discharge status: Home / Self Care | Payer: BLUE CROSS/BLUE SHIELD | Attending: Emergency Medicine | Admitting: Emergency Medicine

## 2019-01-16 ENCOUNTER — Emergency Department (HOSPITAL_COMMUNITY): Payer: BLUE CROSS/BLUE SHIELD

## 2019-01-16 ENCOUNTER — Other Ambulatory Visit: Payer: Self-pay

## 2019-01-16 DIAGNOSIS — Z5321 Procedure and treatment not carried out due to patient leaving prior to being seen by health care provider: Secondary | ICD-10-CM | POA: Diagnosis not present

## 2019-01-16 DIAGNOSIS — R1013 Epigastric pain: Secondary | ICD-10-CM | POA: Insufficient documentation

## 2019-01-16 DIAGNOSIS — R110 Nausea: Secondary | ICD-10-CM | POA: Insufficient documentation

## 2019-01-16 DIAGNOSIS — R0789 Other chest pain: Secondary | ICD-10-CM | POA: Insufficient documentation

## 2019-01-16 DIAGNOSIS — R079 Chest pain, unspecified: Secondary | ICD-10-CM | POA: Diagnosis not present

## 2019-01-16 LAB — BASIC METABOLIC PANEL
Anion gap: 8 (ref 5–15)
BUN: 21 mg/dL — ABNORMAL HIGH (ref 6–20)
CO2: 26 mmol/L (ref 22–32)
Calcium: 9.4 mg/dL (ref 8.9–10.3)
Chloride: 107 mmol/L (ref 98–111)
Creatinine, Ser: 0.82 mg/dL (ref 0.44–1.00)
GFR calc Af Amer: >60 mL/min (ref >60)
GFR calc non Af Amer: >60 mL/min (ref >60)
Glucose, Bld: 94 mg/dL (ref 70–99)
Potassium: 4.5 mmol/L (ref 3.5–5.1)
Sodium: 141 mmol/L (ref 135–145)

## 2019-01-16 LAB — CBC
HCT: 42 % (ref 36.0–46.0)
Hemoglobin: 14 g/dL (ref 12.0–15.0)
MCH: 32.9 pg (ref 26.0–34.0)
MCHC: 33.3 g/dL (ref 30.0–36.0)
MCV: 98.8 fL (ref 80.0–100.0)
Platelets: 195 10*3/uL (ref 150–400)
RBC: 4.25 MIL/uL (ref 3.87–5.11)
RDW: 12.6 % (ref 11.5–15.5)
WBC: 7.7 10*3/uL (ref 4.0–10.5)
nRBC: 0 % (ref 0.0–0.2)

## 2019-01-16 LAB — I-STAT BETA HCG BLOOD, ED (MC, WL, AP ONLY): I-stat hCG, quantitative: 7.4 m[IU]/mL — ABNORMAL HIGH (ref <5)

## 2019-01-16 LAB — TROPONIN I (HIGH SENSITIVITY)
Troponin I (High Sensitivity): <2 ng/L (ref <18)
Troponin I (High Sensitivity): 5 ng/L (ref <18)

## 2019-01-16 NOTE — ED Notes (Signed)
Pt states that she is leaving due to the wait time, stated that she will follow up with her pcp tomorrow.

## 2019-01-16 NOTE — ED Triage Notes (Signed)
Pt arrives POV for eval of ongoing chest pain/epigastric discomfort. Pt notes some nausea, no vomiting. Reports she noted epigastric pain x about 1 week, but now pain started to localize in the L chest this AM. Pt called PCP and they advised her to present here.

## 2019-01-18 DIAGNOSIS — F4323 Adjustment disorder with mixed anxiety and depressed mood: Secondary | ICD-10-CM | POA: Diagnosis not present

## 2019-01-23 DIAGNOSIS — R197 Diarrhea, unspecified: Secondary | ICD-10-CM | POA: Diagnosis not present

## 2019-01-23 DIAGNOSIS — R079 Chest pain, unspecified: Secondary | ICD-10-CM | POA: Diagnosis not present

## 2019-01-23 DIAGNOSIS — R1084 Generalized abdominal pain: Secondary | ICD-10-CM | POA: Diagnosis not present

## 2019-01-29 ENCOUNTER — Ambulatory Visit (INDEPENDENT_AMBULATORY_CARE_PROVIDER_SITE_OTHER): Payer: BLUE CROSS/BLUE SHIELD | Admitting: Family Medicine

## 2019-01-29 ENCOUNTER — Ambulatory Visit: Payer: BLUE CROSS/BLUE SHIELD

## 2019-01-29 ENCOUNTER — Ambulatory Visit: Payer: BLUE CROSS/BLUE SHIELD | Admitting: Family Medicine

## 2019-01-29 ENCOUNTER — Encounter: Payer: Self-pay | Admitting: Family Medicine

## 2019-01-29 ENCOUNTER — Other Ambulatory Visit: Payer: Self-pay

## 2019-01-29 ENCOUNTER — Telehealth: Payer: Self-pay

## 2019-01-29 ENCOUNTER — Ambulatory Visit: Payer: Self-pay

## 2019-01-29 VITALS — BP 130/84 | HR 73 | Ht 65.0 in | Wt 177.4 lb

## 2019-01-29 DIAGNOSIS — M7602 Gluteal tendinitis, left hip: Secondary | ICD-10-CM

## 2019-01-29 NOTE — Progress Notes (Signed)
I, Wendy Poet, LAT, ATC, am serving as scribe for Dr. Lynne Leader.  Samantha Cox is a 63 y.o. female who presents to [place] at [facility] today for f/u of low back pain.  She last saw Dr. Tamala Julian on 10/19/18 for R hip and low back pain and had a R hip greater trochanter injection at that time.  She has had multiple lumbar epidurals w/ the most recent being on 10/31/18.  Her most recent lumbar imaging includes a L-spine XR on 08/11/16 and a L-spine MRI on 10/03/16.  Since her last visit, pt reports "sudden, incapacitating pain" that began on Sunday w/ no known MOI.  Pt reports pain in her L low back that radiated into her L LE to her L knee w/ numbness/tingling noted in the L toes .  Pt's symptoms are improved today compared to the last 2 days.  Pt states that she feels like her L glute is swollen and is putting pressure on her nerves causing pain in her L leg.  Pt reports that she had no relief in her leg symptoms w/ her L epidural.    She denies any lower extremity weakness or bowel bladder dysfunction.  She denies any recent injury.  ROS:  As above  Exam:  BP 130/84 (BP Location: Left Arm, Patient Position: Sitting, Cuff Size: Normal)   Pulse 73   Ht 5\' 5"  (1.651 m)   Wt 177 lb 6.4 oz (80.5 kg)   SpO2 97%   BMI 29.52 kg/m  Wt Readings from Last 5 Encounters:  01/29/19 177 lb 6.4 oz (80.5 kg)  01/16/19 170 lb (77.1 kg)  12/19/18 170 lb 6.4 oz (77.3 kg)  10/19/18 170 lb (77.1 kg)  02/15/18 174 lb (78.9 kg)   General: Well Developed, well nourished, and in no acute distress.  Neuro/Psych: Alert and oriented x3, extra-ocular muscles intact, able to move all 4 extremities, sensation grossly intact. Skin: Warm and dry, no rashes noted.  Respiratory: Not using accessory muscles, speaking in full sentences, trachea midline.  Cardiovascular: Pulses palpable, no extremity edema. Abdomen: Does not appear distended. MSK:  L-spine: Nontender to spinal midline. Limited  motion however able to flex and extend.  Lateral rotation intact as well. Lower extremity strength equal normal throughout except for noted above. Reflexes and sensation intact bilateral lower extremities.  Left hip: Normal-appearing normal motion. Tender palpation SI joint and greater trochanter especially at insertion of gluteus medius tendon. Hip abduction strength diminished 4/5.  Hip strength otherwise normal.     Lab and Radiology Results  Procedure: Real-time Ultrasound Guided Injection of left gluteus medius insertion greater trochanter Device: Philips Affiniti 50G Images permanently stored and available for review in the ultrasound unit. Verbal informed consent obtained.  Discussed risks and benefits of procedure. Warned about infection bleeding damage to structures skin hypopigmentation and fat atrophy among others. Patient expresses understanding and agreement Time-out conducted.   Noted no overlying erythema, induration, or other signs of local infection.   Skin prepped in a sterile fashion.   Local anesthesia: Topical Ethyl chloride.   With sterile technique and under real time ultrasound guidance:  40 mg of Kenalog and 4 mL of Marcaine injected easily.   Completed without difficulty   Pain partially resolved suggesting accurate placement of the medication.   Advised to call if fevers/chills, erythema, induration, drainage, or persistent bleeding.   Images permanently stored and available for review in the ultrasound unit.  Impression: Technically successful ultrasound guided  injection.      Right hip x-ray ordered today however unfortunately x-ray not available in my office at the time of visit.  Patient will either reschedule for a different location or different time to be done in the near future.  EXAM: MRI LUMBAR SPINE WITHOUT CONTRAST date of service October 03, 2016  TECHNIQUE: Multiplanar, multisequence MR imaging of the lumbar spine was performed. No  intravenous contrast was administered.  COMPARISON:  None.  FINDINGS: Segmentation:  Standard.  Alignment:  2 mm anterolisthesis of L4 on L5.  Vertebrae:  No fracture, evidence of discitis, or bone lesion.  Conus medullaris: Extends to the T12-L1 level and appears normal.  Paraspinal and other soft tissues: No paraspinal abnormality.  Disc levels:  Disc spaces: Degenerative disc disease with disc height loss at L3-4 and L4-5.  T12-L1: No significant disc bulge. No evidence of neural foraminal stenosis. No central canal stenosis.  L1-L2: Minimal broad-based disc bulge. Mild bilateral facet arthropathy. No evidence of neural foraminal stenosis. No central canal stenosis.  L2-L3: Mild broad-based disc bulge. Mild bilateral facet arthropathy. No evidence of neural foraminal stenosis. No central canal stenosis.  L3-L4: Broad-based disc bulge flattening the ventral thecal sac. Moderate bilateral facet arthropathy. Bilateral lateral recess stenosis. Moderate spinal stenosis. No evidence of neural foraminal stenosis.  L4-L5: Broad-based disc bulge flattening the ventral thecal sac. Severe bilateral facet arthropathy. Severe spinal stenosis. Bilateral lateral recess stenosis. No evidence of neural foraminal stenosis.  L5-S1: No significant disc bulge. No evidence of neural foraminal stenosis. No central canal stenosis. Moderate bilateral facet arthropathy.  IMPRESSION: 1. At L3-4 there is a broad-based disc bulge flattening the ventral thecal sac. Moderate bilateral facet arthropathy. Bilateral lateral recess stenosis. Moderate spinal stenosis. 2. At L4-5 there is a broad-based disc bulge flattening the ventral thecal sac. Severe bilateral facet arthropathy. Severe spinal stenosis. Bilateral lateral recess stenosis.   Electronically Signed   By: Elige Ko   On: 10/03/2016 15:34  I, [name], personally (independently) visualized and performed the  interpretation of the images attached in this note.    Assessment and Plan: 63 y.o. female with low back pain with lumbar radiculopathy and left lateral hip pain.  Also factorial.  Patient certainly has significant spinal stenosis and neuroforaminal stenosis seen on MRI dated 2018.  This could certainly be a contributing factor as well.  Additionally she has significant buttocks and left lateral hip pain that is due to hip abductor tendinopathy.  Plan for injection at insertion of gluteus medius tendon and greater trochanter.  Will additionally continue home exercise program.  Next step if not better would likely be repeat lumbar MRI and potentially nerve conduction study.  Additionally patient would likely benefit significantly from formal physical therapy however she is reluctant to consider that at this time due to COVID-19 risk.  Recheck back as needed.  Precautions reviewed with patient.   PDMP not reviewed this encounter. Orders Placed This Encounter  Procedures  . No Chg  [place] Lower Lt    Order Specific Question:   Reason for Exam (SYMPTOM  OR DIAGNOSIS REQUIRED)    Answer:   troch bursitis    Order Specific Question:   Preferred imaging location?    Answer:   [facility] Specific Question:   Release to patient    Answer:   Immediate  . DG Hip Unilat W OR W/O Pelvis 2-3 Views Left    Standing Status:   Future  Number of Occurrences:   1    Standing Expiration Date:   03/31/2020    Order Specific Question:   Reason for Exam (SYMPTOM  OR DIAGNOSIS REQUIRED)    Answer:   eval left hip and SI joint pain    Order Specific Question:   Preferred imaging location?    [facility]    Order Specific Question:   Radiology Contrast Protocol - do NOT remove file path    Answer:   [email or web address]   No orders of the defined types were placed in this encounter.   Historical information moved to  improve visibility of documentation.  Past Medical History:  Diagnosis Date  . History of tobacco use   . Routine general medical examination at a health care facility   . Screening for lipoid disorders    Past Surgical History:  Procedure Laterality Date  . OTHER SURGICAL HISTORY     Birthmark removed from forehead 7th grade   Social History   Tobacco Use  . Smoking status: Former Smoker    Packs/day: 1.00    Years: 40.00    Pack years: 40.00    Types: Cigarettes    Quit date: 2015    Years since quitting: 5.9  . Smokeless tobacco: Never Used  Substance Use Topics  . Alcohol use: Yes    Alcohol/week: 1.0 standard drinks    Types: 1 Glasses of wine per week   family history includes Asthma in her mother; Atrial fibrillation in her father; Bladder Cancer in her father; CVA (age of onset: 3578) in her father; Diverticulitis in her brother; Hypertension in her mother; Lung cancer in her brother.  Medications: Current Outpatient Medications  Medication Sig Dispense Refill  . Fluticasone Propionate (XHANCE) 93 MCG/ACT EXHU Place 2 sprays into both nostrils 2 (two) times daily. 32 mL 5  . Melatonin 1 MG TABS Take by mouth.    . methocarbamol (ROBAXIN) 500 MG tablet Take 1 tablet (500 mg total) by mouth 3 (three) times daily as needed for muscle spasms. 90 tablet 0  . Multiple Vitamin (MULTI-VITAMIN) tablet Take by mouth.    . traZODone (DESYREL) 50 MG tablet 100 mg at bedtime.     . Vitamin D, Ergocalciferol, (DRISDOL) 50000 units CAPS capsule Take 1 capsule (50,000 Units total) by mouth every 7 (seven) days. 12 capsule 0   No current facility-administered medications for this visit.   Allergies  Allergen Reactions  . Augmentin [Amoxicillin-Pot Clavulanate] Diarrhea and Other (See Comments)    Felt very bad        Discussed warning signs or symptoms. Please see discharge instructions. Patient expresses understanding.  The above documentation has been reviewed and is  accurate and complete Clementeen GrahamEvan Shannyn Jankowiak

## 2019-01-29 NOTE — Patient Instructions (Addendum)
Thank you for coming in today.  I think PT will be helpful if we get you to it safely.   Call or go to the ER if you develop a large red swollen joint with extreme pain or oozing puss.  Continue home exercises.  Let me know if you would like me to order PT.   Keep me and Dr Tamala Julian updated.   Next step is likely repeat lumbar MRI.  Possibly also a nerve conduction study.

## 2019-01-29 NOTE — Telephone Encounter (Signed)
Called patient regarding her msg about "intense pain" unable to reach and LVM

## 2019-01-31 ENCOUNTER — Ambulatory Visit
Admission: RE | Admit: 2019-01-31 | Discharge: 2019-01-31 | Disposition: A | Discharge status: Home / Self Care | Payer: BLUE CROSS/BLUE SHIELD | Source: Ambulatory Visit | Attending: Family Medicine | Admitting: Family Medicine

## 2019-01-31 ENCOUNTER — Other Ambulatory Visit: Payer: Self-pay

## 2019-01-31 ENCOUNTER — Other Ambulatory Visit: Payer: Self-pay | Admitting: *Deleted

## 2019-01-31 ENCOUNTER — Other Ambulatory Visit: Payer: BLUE CROSS/BLUE SHIELD

## 2019-01-31 ENCOUNTER — Ambulatory Visit (INDEPENDENT_AMBULATORY_CARE_PROVIDER_SITE_OTHER)
Admission: RE | Admit: 2019-01-31 | Discharge: 2019-01-31 | Disposition: A | Discharge status: Home / Self Care | Payer: BLUE CROSS/BLUE SHIELD | Source: Ambulatory Visit | Attending: Family Medicine | Admitting: Family Medicine

## 2019-01-31 DIAGNOSIS — E559 Vitamin D deficiency, unspecified: Secondary | ICD-10-CM | POA: Diagnosis not present

## 2019-01-31 DIAGNOSIS — Z23 Encounter for immunization: Secondary | ICD-10-CM | POA: Diagnosis not present

## 2019-01-31 DIAGNOSIS — Z0000 Encounter for general adult medical examination without abnormal findings: Secondary | ICD-10-CM | POA: Diagnosis not present

## 2019-01-31 DIAGNOSIS — M7602 Gluteal tendinitis, left hip: Secondary | ICD-10-CM

## 2019-01-31 DIAGNOSIS — Z13220 Encounter for screening for lipoid disorders: Secondary | ICD-10-CM | POA: Diagnosis not present

## 2019-01-31 DIAGNOSIS — M160 Bilateral primary osteoarthritis of hip: Secondary | ICD-10-CM

## 2019-01-31 DIAGNOSIS — M1612 Unilateral primary osteoarthritis, left hip: Secondary | ICD-10-CM | POA: Diagnosis not present

## 2019-02-04 NOTE — Progress Notes (Signed)
X-ray left hip and pelvis shows bilateral hip arthritis slightly worse on the left.  SI joints are normal-appearing.  No acute bony changes.

## 2019-02-13 ENCOUNTER — Ambulatory Visit: Payer: Self-pay | Admitting: Cardiology

## 2019-02-14 DIAGNOSIS — R030 Elevated blood-pressure reading, without diagnosis of hypertension: Secondary | ICD-10-CM | POA: Diagnosis not present

## 2019-02-14 DIAGNOSIS — J320 Chronic maxillary sinusitis: Secondary | ICD-10-CM | POA: Diagnosis not present

## 2019-02-14 DIAGNOSIS — E785 Hyperlipidemia, unspecified: Secondary | ICD-10-CM | POA: Diagnosis not present

## 2019-02-14 DIAGNOSIS — R079 Chest pain, unspecified: Secondary | ICD-10-CM | POA: Diagnosis not present

## 2019-02-18 DIAGNOSIS — R42 Dizziness and giddiness: Secondary | ICD-10-CM | POA: Diagnosis not present

## 2019-02-18 DIAGNOSIS — J320 Chronic maxillary sinusitis: Secondary | ICD-10-CM | POA: Insufficient documentation

## 2019-02-18 DIAGNOSIS — J328 Other chronic sinusitis: Secondary | ICD-10-CM | POA: Diagnosis not present

## 2019-02-18 DIAGNOSIS — H903 Sensorineural hearing loss, bilateral: Secondary | ICD-10-CM | POA: Diagnosis not present

## 2019-02-21 DIAGNOSIS — F4323 Adjustment disorder with mixed anxiety and depressed mood: Secondary | ICD-10-CM | POA: Diagnosis not present

## 2019-02-25 ENCOUNTER — Ambulatory Visit: Payer: Self-pay | Admitting: Cardiology

## 2019-02-27 ENCOUNTER — Other Ambulatory Visit: Payer: Self-pay

## 2019-02-27 ENCOUNTER — Telehealth: Payer: Self-pay

## 2019-02-27 ENCOUNTER — Ambulatory Visit: Payer: Self-pay | Admitting: Cardiology

## 2019-02-27 DIAGNOSIS — M48061 Spinal stenosis, lumbar region without neurogenic claudication: Secondary | ICD-10-CM

## 2019-02-27 DIAGNOSIS — M7602 Gluteal tendinitis, left hip: Secondary | ICD-10-CM

## 2019-02-27 NOTE — Telephone Encounter (Signed)
Patient is wanting to get a referral to [facility] Attn: Dr. Hayden Rasmussen. Patient states that she is wanting to get more help in re-gard to the back and gluteal pain.   Fax: [phone]

## 2019-02-27 NOTE — Telephone Encounter (Signed)
Order placed for a referral. Patient notified.

## 2019-02-28 ENCOUNTER — Ambulatory Visit (INDEPENDENT_AMBULATORY_CARE_PROVIDER_SITE_OTHER): Payer: BLUE CROSS/BLUE SHIELD | Admitting: Cardiology

## 2019-02-28 ENCOUNTER — Other Ambulatory Visit: Payer: Self-pay

## 2019-02-28 ENCOUNTER — Encounter: Payer: Self-pay | Admitting: Cardiology

## 2019-02-28 VITALS — BP 108/63 | HR 92 | Temp 96.5°F | Ht 66.0 in | Wt 174.0 lb

## 2019-02-28 DIAGNOSIS — R0789 Other chest pain: Secondary | ICD-10-CM | POA: Diagnosis not present

## 2019-02-28 DIAGNOSIS — R0600 Dyspnea, unspecified: Secondary | ICD-10-CM

## 2019-02-28 DIAGNOSIS — I2584 Coronary atherosclerosis due to calcified coronary lesion: Secondary | ICD-10-CM

## 2019-02-28 DIAGNOSIS — R0609 Other forms of dyspnea: Secondary | ICD-10-CM | POA: Diagnosis not present

## 2019-02-28 DIAGNOSIS — Z87891 Personal history of nicotine dependence: Secondary | ICD-10-CM

## 2019-02-28 DIAGNOSIS — I2510 Atherosclerotic heart disease of native coronary artery without angina pectoris: Secondary | ICD-10-CM | POA: Diagnosis not present

## 2019-02-28 DIAGNOSIS — J432 Centrilobular emphysema: Secondary | ICD-10-CM | POA: Diagnosis not present

## 2019-02-28 DIAGNOSIS — E7800 Pure hypercholesterolemia, unspecified: Secondary | ICD-10-CM

## 2019-02-28 MED ORDER — ASPIRIN EC 81 MG PO TBEC: 81.0000 mg | DELAYED_RELEASE_TABLET | Freq: Every day | ORAL | 3 refills | Status: DC

## 2019-02-28 MED ORDER — ROSUVASTATIN CALCIUM 10 MG PO TABS: 10.0000 mg | ORAL_TABLET | Freq: Every day | ORAL | 2 refills | Status: DC

## 2019-02-28 NOTE — Progress Notes (Signed)
Primary Physician/Referring:  Laurann Montana, MD  Patient ID: Samantha Cox, female    DOB: [birth year], 64 y.o.   MRN: [id number]  Chief Complaint  Patient presents with  . Chest Pain  . New Patient (Initial Visit)    c/o high Bp's   HPI:    Samantha Cox  is a 64 y.o. Caucasian female with 40+-pack-year history of smoking quit in 2015, hyperlipidemia, GERD, referred to me by both PCP and also Dr. Elnoria Howard for evaluation of chest pain.  Patient presented to the emergency room on 01/16/2019 with chest pain, but could not be seen by anyone EKG was normal.  Describes chest tightness in the middle of the chest and also the left side of the chest.  Comes with exertion activity and she has noticed over the past few months and especially in the last one month or so that she is getting more short winded than usual along with feeling of tightness in the chest.  No other associated symptoms.  Past Medical History:  Diagnosis Date  . History of tobacco use   . Routine general medical examination at a health care facility   . Screening for lipoid disorders    Past Surgical History:  Procedure Laterality Date  . OTHER SURGICAL HISTORY     Birthmark removed from forehead 7th grade   Social History   Tobacco Use  . Smoking status: Former Smoker    Packs/day: 1.00    Years: 40.00    Pack years: 40.00    Types: Cigarettes    Quit date: 2015    Years since quitting: 6.0  . Smokeless tobacco: Never Used  Substance Use Topics  . Alcohol use: Yes    Alcohol/week: 1.0 standard drinks    Types: 1 Glasses of wine per week    Comment: occ   ROS  Review of Systems  Constitution: Negative for weight gain.  Cardiovascular: Positive for chest pain. Negative for leg swelling and syncope.  Respiratory: Positive for cough, shortness of breath and wheezing. Negative for hemoptysis.   Endocrine: Negative for cold intolerance.  Hematologic/Lymphatic: Does not bruise/bleed easily.    Musculoskeletal: Positive for back pain.  Gastrointestinal: Negative for hematochezia and melena.  Neurological: Negative for headaches and light-headedness.   Objective  Blood pressure 108/63, pulse 92, temperature (!) 96.5 F (35.8 C), height 5\' 6"  (1.676 m), weight 174 lb (78.9 kg), SpO2 96 %.  Vitals with BMI 02/28/2019 01/29/2019 01/16/2019  Height 5\' 6"  5\' 5"  -  Weight 174 lbs 177 lbs 6 oz -  BMI 28.1 29.52 -  Systolic 108 130 14/10/2018  Diastolic 63 84 82  Pulse 92 73 66     Physical Exam  Constitutional:  Moderately built and nourished in no acute distress  Neck: No thyromegaly present.  Cardiovascular: Normal rate, regular rhythm, normal heart sounds and intact distal pulses. Exam reveals no gallop.  No murmur heard. No leg edema, no JVD.  Pulmonary/Chest: Effort normal and breath sounds normal.  Abdominal: Soft. Bowel sounds are normal.  Musculoskeletal:     Cervical back: Neck supple.  Skin: Skin is warm and dry.   Laboratory examination:   Recent Labs    01/16/19 1703  NA 141  K 4.5  CL 107  CO2 26  GLUCOSE 94  BUN 21*  CREATININE 0.82  CALCIUM 9.4  GFRNONAA >60  GFRAA >60   CrCl cannot be calculated (Patient's most recent lab result is older than the maximum 21  days allowed.).  CMP Latest Ref Rng & Units 01/16/2019 05/21/2017 09/20/2016  Glucose 70 - 99 mg/dL 94 170(Y) -  BUN 6 - 20 mg/dL 17(C) 13 -  Creatinine 0.44 - 1.00 mg/dL 9.44 9.67 -  Sodium 591 - 145 mmol/L 141 140 -  Potassium 3.5 - 5.1 mmol/L 4.5 3.5 -  Chloride 98 - 111 mmol/L 107 107 -  CO2 22 - 32 mmol/L 26 22 -  Calcium 8.9 - 10.3 mg/dL 9.4 9.6 9.4  Total Protein 6.5 - 8.1 g/dL - 7.3 -  Total Bilirubin 0.3 - 1.2 mg/dL - 1.0 -  Alkaline Phos 38 - 126 U/L - 93 -  AST 15 - 41 U/L - 32 -  ALT 14 - 54 U/L - 31 -   CBC Latest Ref Rng & Units 01/16/2019 05/21/2017 09/20/2016  WBC 4.0 - 10.5 K/uL 7.7 12.8(H) 8.0  Hemoglobin 12.0 - 15.0 g/dL 63.8 15.2(H) 14.7  Hematocrit 36.0 - 46.0 % 42.0 44.8  43.4  Platelets 150 - 400 K/uL 195 185 187.0   Lipid Panel  No results found for: CHOL, TRIG, HDL, CHOLHDL, VLDL, LDLCALC, LDLDIRECT HEMOGLOBIN A1C No results found for: HGBA1C, MPG TSH No results for input(s): TSH in the last 8760 hours.   Outside labs 01/31/2019: Cholesterol, total 256 HDL 86 LDL 151 Triglycerides 113 NHDL 176 Hemoglobin 14.5 Creatinine, Serum 0.61 Potassium 4.700 01/31/2019 ALT (SGPT) 30  TSH 1.190 01/31/2019  Medications and allergies   Allergies  Allergen Reactions  . Augmentin [Amoxicillin-Pot Clavulanate] Diarrhea and Other (See Comments)    Felt very bad       Current Outpatient Medications  Medication Instructions  . aspirin EC 81 mg, Oral, Daily  . Fluticasone Propionate (XHANCE) 93 MCG/ACT EXHU 2 sprays, Each Nare, 2 times daily  . Melatonin 3 MG TABS 1 tablet, Oral, As needed  . methocarbamol (ROBAXIN) 500 mg, Oral, 3 times daily PRN  . Multiple Vitamin (MULTI-VITAMIN) tablet Oral  . pantoprazole (PROTONIX) 40 mg, Oral, Daily  . rosuvastatin (CRESTOR) 10 mg, Oral, Daily  . traZODone (DESYREL) 100 mg, Daily at bedtime  . Vitamin D (Ergocalciferol) (DRISDOL) 50,000 Units, Oral, Every 7 days    Radiology:   Low-dose CT chest for lung cancer screening 02/14/2018: Cardiovascular: Heart size is normal. There is no significant pericardial fluid, thickening or pericardial calcification. No atherosclerotic calcifications in the thoracic aorta or the coronary arteries. Lungs/Pleura: No suspicious appearing pulmonary nodules or masses. No acute consolidative airspace disease. No pleural effusions. Mild diffuse bronchial wall thickening with mild centrilobular and paraseptal emphysema.  Cardiac Studies:   None  Assessment     ICD-10-CM   1. Atypical chest pain  R07.89 PCV ECHOCARDIOGRAM COMPLETE    PCV MYOCARDIAL PERFUSION WITH LEXISCAN    CANCELED: EKG 12-Lead  2. Coronary artery calcification  I25.10 PCV ECHOCARDIOGRAM COMPLETE   I25.84  PCV MYOCARDIAL PERFUSION WITH LEXISCAN    aspirin EC 81 MG tablet  3. Dyspnea on exertion  R06.00 PCV ECHOCARDIOGRAM COMPLETE    PCV MYOCARDIAL PERFUSION WITH LEXISCAN  4. Centrilobular emphysema (HCC)  J43.2   5. History of tobacco use, presenting hazards to health quit 2015. 40 pack year history  Z87.891   6. Hypercholesteremia  E78.00 rosuvastatin (CRESTOR) 10 MG tablet    Lipid Panel With LDL/HDL Ratio    LDL cholesterol, direct    Lipoprotein A (LPA)    Lipoprotein A (LPA)    LDL cholesterol, direct    Lipid Panel With LDL/HDL Ratio  EKG 01/16/2019: Normal sinus rhythm at the rate of 70 bpm, normal axis.  No evidence of ischemia, normal EKG.    Meds ordered this encounter  Medications  . rosuvastatin (CRESTOR) 10 MG tablet    Sig: Take 1 tablet (10 mg total) by mouth daily.    Dispense:  30 tablet    Refill:  2  . aspirin EC 81 MG tablet    Sig: Take 1 tablet (81 mg total) by mouth daily.    Dispense:  90 tablet    Refill:  3    Medications Discontinued During This Encounter  Medication Reason  . Melatonin 1 MG TABS Entry Error    Recommendations:   DECARLA SIEMEN  is a 64 y.o. Caucasian female with 40+-pack-year history of smoking quit in 2015, hyperlipidemia, GERD, referred to me by both PCP and also Dr. Benson Norway for evaluation of chest pain.  Patient presented to the emergency room on 01/16/2019 with chest pain, but could not be seen by anyone EKG was normal. She is also noticed fluctuation in blood pressure but today the blood pressure was normal.  Symptoms are atypical but in view of underlying coronary calcification noted on the CT scan, heavy tobacco use disorder, hyperlipidemia, she is certainly at high risk for underlying significant CAD.  In view of this advised her to start a statin as her non-HDL cholesterol is not at goal, patient is now willing to start Crestor 10 mg daily.  She will need lipid profile testing in 2 months. Patient instructed to start ASA  81mg   q daily for prophylaxis. Will schedule for an echocardiogram. Schedule for a Lexiscan Sestamibi stress test to evaluate for myocardial ischemia. Patient unable to do treadmill stress testing due to Covid 19, to reduce aerosol spread/risk of exposure. Office visit following the work-up/investigations in 4 weeks.   Adrian Prows, MD, [facility] 02/28/2019, 2:03 PM [facility]. [place] Office: [phone]

## 2019-03-05 ENCOUNTER — Ambulatory Visit (INDEPENDENT_AMBULATORY_CARE_PROVIDER_SITE_OTHER): Payer: BLUE CROSS/BLUE SHIELD | Admitting: Family Medicine

## 2019-03-05 ENCOUNTER — Encounter: Payer: Self-pay | Admitting: Family Medicine

## 2019-03-05 ENCOUNTER — Other Ambulatory Visit: Payer: Self-pay

## 2019-03-05 VITALS — BP 118/86 | HR 86 | Ht 66.0 in | Wt 176.2 lb

## 2019-03-05 DIAGNOSIS — M48061 Spinal stenosis, lumbar region without neurogenic claudication: Secondary | ICD-10-CM

## 2019-03-05 DIAGNOSIS — M47816 Spondylosis without myelopathy or radiculopathy, lumbar region: Secondary | ICD-10-CM | POA: Insufficient documentation

## 2019-03-05 NOTE — Progress Notes (Signed)
I, [name], LAT, ATC, am serving as scribe for Dr. [name].  Samantha Cox is a 64 y.o. female who presents to [facility] at [facility] today for f/u of low back pain and L leg pain.  She was last seen by Dr. [name] on 01/29/19 and had a L GT injection.  She has a hx of multiple lumbar epidurals w/ the last being on 10/31/18.  She reports pain in her low back that radiates into her L LE most recently to the L knee w/ numbness/tingling noted in her L toes.  She was most recently referred to [facility] on 02/27/19.  Since her last visit, pt reports that the injection she received at her last visit was helpful.  She states that her back pain is feeling worse and is having pain in her B post thighs.  She states that it has been a while since she's had an epidural and is curious if she can have another one.  However, she does report that she did not have much relief from her most recent epidural on 10/31/18.   Pertinent review of systems: No fevers or chills.  Relevant historical information: Has been currently undergoing chemotherapy at [facility].  Patient is very worried about COVID-19 risk.   Exam:  BP 118/86 (BP Location: Left Arm, Patient Position: Sitting, Cuff Size: Normal)   Pulse 86   Ht 5\' 6"  (1.676 m)   Wt 176 lb 3.2 oz (79.9 kg)   SpO2 97%   BMI 28.44 kg/m  General: Well Developed, well nourished, and in no acute distress.   MSK:  L-spine: Nontender to midline.  Tender palpation paraspinal musculature. Decreased lumbar motion. Motor strength is intact.    Lab and Radiology Results  EXAM: MRI LUMBAR SPINE WITHOUT CONTRAST 10/03/16  TECHNIQUE: Multiplanar, multisequence MR imaging of the lumbar spine was performed. No intravenous contrast was administered.  COMPARISON:  None.  FINDINGS: Segmentation:  Standard.  Alignment:  2 mm anterolisthesis of L4 on L5.  Vertebrae:  No fracture, evidence of discitis, or bone lesion.  Conus  medullaris: Extends to the T12-L1 level and appears normal.  Paraspinal and other soft tissues: No paraspinal abnormality.  Disc levels:  Disc spaces: Degenerative disc disease with disc height loss at L3-4 and L4-5.  T12-L1: No significant disc bulge. No evidence of neural foraminal stenosis. No central canal stenosis.  L1-L2: Minimal broad-based disc bulge. Mild bilateral facet arthropathy. No evidence of neural foraminal stenosis. No central canal stenosis.  L2-L3: Mild broad-based disc bulge. Mild bilateral facet arthropathy. No evidence of neural foraminal stenosis. No central canal stenosis.  L3-L4: Broad-based disc bulge flattening the ventral thecal sac. Moderate bilateral facet arthropathy. Bilateral lateral recess stenosis. Moderate spinal stenosis. No evidence of neural foraminal stenosis.  L4-L5: Broad-based disc bulge flattening the ventral thecal sac. Severe bilateral facet arthropathy. Severe spinal stenosis. Bilateral lateral recess stenosis. No evidence of neural foraminal stenosis.  L5-S1: No significant disc bulge. No evidence of neural foraminal stenosis. No central canal stenosis. Moderate bilateral facet arthropathy.  IMPRESSION: 1. At L3-4 there is a broad-based disc bulge flattening the ventral thecal sac. Moderate bilateral facet arthropathy. Bilateral lateral recess stenosis. Moderate spinal stenosis. 2. At L4-5 there is a broad-based disc bulge flattening the ventral thecal sac. Severe bilateral facet arthropathy. Severe spinal stenosis. Bilateral lateral recess stenosis.   Electronically Signed   By: 10/05/16   On: 10/03/2016 15:34  I, 10/05/2016, personally (independently)  visualized and performed the interpretation of the images attached in this note.   Assessment and Plan: 64 y.o. female with primarily worsening low back pain.  Patient has had multiple different treatments for her chronic low back pain.  Pain is thought  to be primarily due to his spinal stenosis and and facet DJD and DDD.  She has had trials of physical therapy which were reasonably well however she cannot attend physical therapy right now due to her concern for COVID-19 risk. Additionally she has had lumbar epidural steroid injections typically interlaminar most recently September 2020.  These helped some to get help control her back pain and helps some to help control her lower extremity pain.  She has not had any facet injections. She notes that today predominantly is her back that hurts less that her legs that hurt.  Discuss treatment plan and options.  As noted above patient has already been referred to chronic pain management which likely will proceed with further interventions including likely medial branch ablations in the visit joint.  However we will start the process of proceeding with facet injections.  Based on the review of MRI from August 2018 and her current pain most likely target are bilateral L4-L5 facets.  Plan for facet injection at this location.  Would proceed to other locations of little benefit and proceed to ablation if good benefit in the does not last long.  Additionally patient would be a good candidate for physical therapy as well.  Once she gets the COVID-19 vaccine she will let me know and I will put a referral in for physical therapy.  In the meantime today ATC/PT in clinic in reviewed core strengthening and mobility exercises.  Patient will keep me informed and recheck as needed.  [id number]; 15 additional minutes spent for Therapeutic exercises as stated in above notes.  This included exercises focusing on stretching, strengthening, with significant focus on eccentric aspects.   Long term goals include an improvement in range of motion, strength, endurance as well as avoiding reinjury. Patient's frequency would include in 1-2 times a day, 3-5 times a week for a duration of 6-12 weeks.  Proper technique shown and discussed  handout in great detail with ATC.  All questions were discussed and answered.      Orders Placed This Encounter  Procedures  . DG Facet Jt Neuro Destruct Sing L/S w/Img Guide    Order Specific Question:   Reason for exam:    Answer:   Need confirmatory medial branch block, and if responsive, radiofrequency ablation of: BL facet L4/L5    Order Specific Question:   Preferred imaging location?    Answer:   [facility]   No orders of the defined types were placed in this encounter.    Discussed warning signs or symptoms. Please see discharge instructions. Patient expresses understanding.   The above documentation has been reviewed and is accurate and complete Samantha Cox

## 2019-03-05 NOTE — Patient Instructions (Addendum)
Thank you for coming in today.  You should hear about back injection at facet joints.  Let me know how you respond. Based on response we may do different levels.   Look up back yoga and core strength exercises.  Youtube can be a good resource.   Avoid extension of back.   Please perform the exercise program that we have prepared for you and gone over in detail on a daily basis.  In addition to the handout you were provided you can access your program through: [email or web address]   Your unique program code is:  [email or web address]

## 2019-03-06 DIAGNOSIS — J343 Hypertrophy of nasal turbinates: Secondary | ICD-10-CM | POA: Diagnosis not present

## 2019-03-06 DIAGNOSIS — J342 Deviated nasal septum: Secondary | ICD-10-CM | POA: Diagnosis not present

## 2019-03-06 DIAGNOSIS — R42 Dizziness and giddiness: Secondary | ICD-10-CM | POA: Diagnosis not present

## 2019-03-07 ENCOUNTER — Other Ambulatory Visit: Payer: Self-pay | Admitting: Family Medicine

## 2019-03-07 DIAGNOSIS — M5136 Other intervertebral disc degeneration, lumbar region: Secondary | ICD-10-CM

## 2019-03-07 DIAGNOSIS — M47816 Spondylosis without myelopathy or radiculopathy, lumbar region: Secondary | ICD-10-CM

## 2019-03-08 ENCOUNTER — Encounter: Payer: Self-pay | Admitting: Family Medicine

## 2019-03-08 ENCOUNTER — Other Ambulatory Visit: Payer: Self-pay

## 2019-03-08 ENCOUNTER — Ambulatory Visit (INDEPENDENT_AMBULATORY_CARE_PROVIDER_SITE_OTHER): Payer: BLUE CROSS/BLUE SHIELD

## 2019-03-08 DIAGNOSIS — R0609 Other forms of dyspnea: Secondary | ICD-10-CM | POA: Diagnosis not present

## 2019-03-08 DIAGNOSIS — I2510 Atherosclerotic heart disease of native coronary artery without angina pectoris: Secondary | ICD-10-CM

## 2019-03-08 DIAGNOSIS — I2584 Coronary atherosclerosis due to calcified coronary lesion: Secondary | ICD-10-CM | POA: Diagnosis not present

## 2019-03-08 DIAGNOSIS — R0789 Other chest pain: Secondary | ICD-10-CM

## 2019-03-08 DIAGNOSIS — R0600 Dyspnea, unspecified: Secondary | ICD-10-CM

## 2019-03-11 ENCOUNTER — Telehealth: Payer: Self-pay

## 2019-03-11 DIAGNOSIS — M48061 Spinal stenosis, lumbar region without neurogenic claudication: Secondary | ICD-10-CM

## 2019-03-11 NOTE — Telephone Encounter (Signed)
Patient called back and states that the schedula at [facility] imaging is convinced that she is scheduled for the radio frequency ablation that is ordered, but that is not what is ordered. Patient is wanting to have you call and make sure it is the right injection so she does not get the wong thing.

## 2019-03-11 NOTE — Telephone Encounter (Signed)
I called [place] imaging and got the order changed.  I think the error happened on my end.  You should be able to keep the same appointment time.  I called [facility] and said the same.

## 2019-03-14 ENCOUNTER — Ambulatory Visit
Admission: RE | Admit: 2019-03-14 | Discharge: 2019-03-14 | Disposition: A | Discharge status: Home / Self Care | Payer: BLUE CROSS/BLUE SHIELD | Source: Ambulatory Visit | Attending: Family Medicine | Admitting: Family Medicine

## 2019-03-14 ENCOUNTER — Other Ambulatory Visit: Payer: Self-pay

## 2019-03-14 DIAGNOSIS — M47816 Spondylosis without myelopathy or radiculopathy, lumbar region: Secondary | ICD-10-CM

## 2019-03-14 DIAGNOSIS — M5136 Other intervertebral disc degeneration, lumbar region: Secondary | ICD-10-CM

## 2019-03-14 DIAGNOSIS — G8929 Other chronic pain: Secondary | ICD-10-CM | POA: Diagnosis not present

## 2019-03-14 DIAGNOSIS — M545 Low back pain: Secondary | ICD-10-CM | POA: Diagnosis not present

## 2019-03-14 MED ORDER — METHYLPREDNISOLONE ACETATE 40 MG/ML INJ SUSP (RADIOLOG
120.0000 mg | Freq: Once | INTRAMUSCULAR | Status: AC
  Administered 2019-03-14: 120 mg via INTRA_ARTICULAR

## 2019-03-14 MED ORDER — IOPAMIDOL (ISOVUE-M 200) INJECTION 41%
1.0000 mL | Freq: Once | INTRAMUSCULAR | Status: AC
  Administered 2019-03-14: 1 mL via INTRA_ARTICULAR

## 2019-03-14 NOTE — Discharge Instructions (Signed)

## 2019-03-18 ENCOUNTER — Ambulatory Visit (INDEPENDENT_AMBULATORY_CARE_PROVIDER_SITE_OTHER): Payer: BLUE CROSS/BLUE SHIELD

## 2019-03-18 ENCOUNTER — Other Ambulatory Visit: Payer: Self-pay

## 2019-03-18 DIAGNOSIS — I2510 Atherosclerotic heart disease of native coronary artery without angina pectoris: Secondary | ICD-10-CM

## 2019-03-18 DIAGNOSIS — R0789 Other chest pain: Secondary | ICD-10-CM | POA: Diagnosis not present

## 2019-03-18 DIAGNOSIS — R0609 Other forms of dyspnea: Secondary | ICD-10-CM

## 2019-03-18 DIAGNOSIS — I2584 Coronary atherosclerosis due to calcified coronary lesion: Secondary | ICD-10-CM

## 2019-03-18 DIAGNOSIS — R0600 Dyspnea, unspecified: Secondary | ICD-10-CM

## 2019-03-25 ENCOUNTER — Ambulatory Visit: Payer: BLUE CROSS/BLUE SHIELD | Admitting: Allergy

## 2019-03-26 ENCOUNTER — Telehealth: Payer: Self-pay | Admitting: Acute Care

## 2019-03-27 NOTE — Telephone Encounter (Signed)
LMTC x 1 - need to discuss lung cancer screening  

## 2019-04-01 ENCOUNTER — Telehealth: Payer: Self-pay | Admitting: Family Medicine

## 2019-04-01 DIAGNOSIS — E7800 Pure hypercholesterolemia, unspecified: Secondary | ICD-10-CM | POA: Diagnosis not present

## 2019-04-01 NOTE — Telephone Encounter (Signed)
Spoke with pt regarding lung cancer screening. PT does not want to come for a shared decision making visit. She only wants to have CT done. I explained to pt that this visit is required by insurance and our office to be a part to the screening program. Pt still refused. I have sent a note to let Dr Cliffton Asters know what was discussed with this pt. Referral has been cancelled.

## 2019-04-01 NOTE — Telephone Encounter (Signed)
Patient called requesting a refill on methocarbamol (ROBAXIN) 500 MG tablet sent to [facility]. She asked for a 90 day supple if possible.  Please advise.

## 2019-04-02 ENCOUNTER — Other Ambulatory Visit: Payer: Self-pay | Admitting: Family Medicine

## 2019-04-02 DIAGNOSIS — F17201 Nicotine dependence, unspecified, in remission: Secondary | ICD-10-CM

## 2019-04-02 LAB — LIPID PANEL WITH LDL/HDL RATIO
Cholesterol, Total: 227 mg/dL — ABNORMAL HIGH (ref 100–199)
HDL: 94 mg/dL (ref >39)
LDL Chol Calc (NIH): 108 mg/dL — ABNORMAL HIGH (ref 0–99)
LDL/HDL Ratio: 1.1 ratio (ref 0.0–3.2)
Triglycerides: 147 mg/dL (ref 0–149)
VLDL Cholesterol Cal: 25 mg/dL (ref 5–40)

## 2019-04-02 LAB — LIPOPROTEIN A (LPA): Lipoprotein (a): 9.5 nmol/L (ref <75.0)

## 2019-04-02 LAB — LDL CHOLESTEROL, DIRECT: LDL Direct: 114 mg/dL — ABNORMAL HIGH (ref 0–99)

## 2019-04-02 NOTE — Progress Notes (Signed)
Lipids improved with LDL 114 04/02/19. Needs to increase statin dose and will discuss on OV  Outside labs 01/31/2019: Cholesterol, total 256 HDL 86 LDL 151 Triglycerides 113 NHDL 176

## 2019-04-03 ENCOUNTER — Other Ambulatory Visit: Payer: Self-pay

## 2019-04-03 MED ORDER — METHOCARBAMOL 500 MG PO TABS: 500.0000 mg | ORAL_TABLET | Freq: Three times a day (TID) | ORAL | 0 refills | Status: DC | PRN

## 2019-04-03 NOTE — Telephone Encounter (Signed)
Rx sent in

## 2019-04-04 ENCOUNTER — Telehealth: Payer: BLUE CROSS/BLUE SHIELD | Admitting: Cardiology

## 2019-04-04 ENCOUNTER — Other Ambulatory Visit: Payer: Self-pay

## 2019-04-04 ENCOUNTER — Encounter: Payer: Self-pay | Admitting: Cardiology

## 2019-04-04 DIAGNOSIS — R0600 Dyspnea, unspecified: Secondary | ICD-10-CM

## 2019-04-04 DIAGNOSIS — R0789 Other chest pain: Secondary | ICD-10-CM | POA: Diagnosis not present

## 2019-04-04 DIAGNOSIS — E7800 Pure hypercholesterolemia, unspecified: Secondary | ICD-10-CM

## 2019-04-04 DIAGNOSIS — I2510 Atherosclerotic heart disease of native coronary artery without angina pectoris: Secondary | ICD-10-CM

## 2019-04-04 DIAGNOSIS — R0609 Other forms of dyspnea: Secondary | ICD-10-CM

## 2019-04-04 MED ORDER — ROSUVASTATIN CALCIUM 10 MG PO TABS: 10.0000 mg | ORAL_TABLET | Freq: Every day | ORAL | 1 refills | Status: DC

## 2019-04-04 NOTE — Progress Notes (Signed)
Virtual Visit via Video Note: This visit type was conducted due to national recommendations for restrictions regarding the COVID-19 Pandemic (e.g. social distancing).  This format is felt to be most appropriate for this patient at this time.  All issues noted in this document were discussed and addressed.  No physical exam was performed (except for noted visual exam findings with Telehealth visits).  The patient has consented to conduct a Telehealth visit and understands insurance will be billed.   I connected with@, on 04/04/19 at  by a video enabled telemedicine application and verified that I am speaking with the correct person using two identifiers.   I discussed the limitations of evaluation and management by telemedicine and the availability of in person appointments. The patient expressed understanding and agreed to proceed.   I have discussed with patient regarding the safety during COVID Pandemic and steps and precautions to be taken including social distancing, frequent hand wash and use of detergent soap, gels with the patient. I asked the patient to avoid touching mouth, nose, eyes, ears with the hands. I encouraged regular walking around the neighborhood and exercise and regular diet, as long as social distancing can be maintained.  Primary Physician/Referring:  Harlan Stains, MD  Patient ID: Samantha Cox, female    DOB: 1955/11/11, 64 y.o.   MRN: [id number]  Chief Complaint  Patient presents with  . Chest Pain    4 week follow up   HPI:    BRIHANA QUICKEL  is a 64 y.o. LYRIS HITCHMAN  is a 64 y.o. Caucasian female with 40+-pack-year history of smoking quit in 2015, hyperlipidemia, GERD presented to the emergency room on 01/16/2019 with chest pain. I had seen her 6 weeks ago, patient instructed to start ASA  81mg  q daily for prophylaxis.  She was also started on statin.  She also has chronic shortness of breath.  She underwent stress testing and echocardiogram and  presents for follow-up.  Patient states that she is doing well and she has not had any further episodes of chest pain.  She is also made lifestyle changes.  She is tolerating Crestor without any complications.  Past Medical History:  Diagnosis Date  . History of tobacco use   . Routine general medical examination at a health care facility   . Screening for lipoid disorders    Past Surgical History:  Procedure Laterality Date  . OTHER SURGICAL HISTORY     Birthmark removed from forehead 7th grade   Social History   Tobacco Use  . Smoking status: Former Smoker    Packs/day: 1.00    Years: 40.00    Pack years: 40.00    Types: Cigarettes    Quit date: 2015    Years since quitting: 6.1  . Smokeless tobacco: Never Used  Substance Use Topics  . Alcohol use: Yes    Alcohol/week: 1.0 standard drinks    Types: 1 Glasses of wine per week    Comment: occ   ROS  Review of Systems  Constitution: Negative for weight gain.  Cardiovascular: Negative for chest pain, leg swelling and syncope.  Respiratory: Positive for cough, shortness of breath and wheezing. Negative for hemoptysis.   Musculoskeletal: Positive for back pain.   Objective  There were no vitals taken for this visit.  Vitals with BMI 03/14/2019 03/05/2019 02/28/2019  Height - 5\' 6"  5\' 6"   Weight - 176 lbs 3 oz 174 lbs  BMI - 01.74 94.4  Systolic 967 591  108  Diastolic 78 86 63  Pulse 78 86 92   Physical exam not performed or limited due to virtual visit.  Patient appeared to be in no distress, Neck was supple, respiration was not labored.  Please see exam details from prior visit is as below.     Physical Exam  Constitutional:  Moderately built and nourished in no acute distress  Cardiovascular: Normal rate, regular rhythm, normal heart sounds and intact distal pulses. Exam reveals no gallop.  No murmur heard. No leg edema, no JVD.  Pulmonary/Chest: Effort normal and breath sounds normal.  Abdominal: Soft. Bowel  sounds are normal.   Laboratory examination:   Recent Labs    01/16/19 1703  NA 141  K 4.5  CL 107  CO2 26  GLUCOSE 94  BUN 21*  CREATININE 0.82  CALCIUM 9.4  GFRNONAA >60  GFRAA >60   CrCl cannot be calculated (Patient's most recent lab result is older than the maximum 21 days allowed.).  CMP Latest Ref Rng & Units 01/16/2019 05/21/2017 09/20/2016  Glucose 70 - 99 mg/dL 94 876(O) -  BUN 6 - 20 mg/dL 11(X) 13 -  Creatinine 0.44 - 1.00 mg/dL 7.26 2.03 -  Sodium 559 - 145 mmol/L 141 140 -  Potassium 3.5 - 5.1 mmol/L 4.5 3.5 -  Chloride 98 - 111 mmol/L 107 107 -  CO2 22 - 32 mmol/L 26 22 -  Calcium 8.9 - 10.3 mg/dL 9.4 9.6 9.4  Total Protein 6.5 - 8.1 g/dL - 7.3 -  Total Bilirubin 0.3 - 1.2 mg/dL - 1.0 -  Alkaline Phos 38 - 126 U/L - 93 -  AST 15 - 41 U/L - 32 -  ALT 14 - 54 U/L - 31 -   CBC Latest Ref Rng & Units 01/16/2019 05/21/2017 09/20/2016  WBC 4.0 - 10.5 K/uL 7.7 12.8(H) 8.0  Hemoglobin 12.0 - 15.0 g/dL 74.1 15.2(H) 14.7  Hematocrit 36.0 - 46.0 % 42.0 44.8 43.4  Platelets 150 - 400 K/uL 195 185 187.0   Lipid Panel     Component Value Date/Time   CHOL 227 (H) 04/01/2019 1310   TRIG 147 04/01/2019 1310   HDL 94 04/01/2019 1310   LDLCALC 108 (H) 04/01/2019 1310   LDLDIRECT 114 (H) 04/01/2019 1311       NHDL                                                             133  Outside labs 01/31/2019: Cholesterol, total 256 Triglycerides 113 HDL 86 LDL 151  NHDL 176 Hemoglobin 14.5 Creatinine, Serum 0.61 Potassium 4.700 01/31/2019 ALT (SGPT) 30  TSH 1.190 01/31/2019  Medications and allergies   Allergies  Allergen Reactions  . Augmentin [Amoxicillin-Pot Clavulanate] Diarrhea and Other (See Comments)    Felt very bad      Current Outpatient Medications  Medication Instructions  . aspirin EC 81 mg, Oral, Daily  . azelastine (ASTELIN) 0.1 % nasal spray No dose, route, or frequency recorded.  . Fluticasone Propionate (XHANCE) 93 MCG/ACT EXHU 2 sprays, Each Nare,  2 times daily  . Melatonin 3 MG TABS 1 tablet, Oral, As needed  . methocarbamol (ROBAXIN) 500 mg, Oral, 3 times daily PRN  . Multiple Vitamin (MULTI-VITAMIN) tablet Oral  . rosuvastatin (CRESTOR) 10 mg,  Oral, Daily  . traZODone (DESYREL) 100 mg, Daily at bedtime  . Vitamin D (Ergocalciferol) (DRISDOL) 50,000 Units, Oral, Every 7 days   Radiology:   Low-dose CT chest for lung cancer screening 02/14/2018: Cardiovascular: Heart size is normal. There is no significant pericardial fluid, thickening or pericardial calcification. No atherosclerotic calcifications in the thoracic aorta or the coronary arteries. Lungs/Pleura: No suspicious appearing pulmonary nodules or masses. No acute consolidative airspace disease. No pleural effusions. Mild diffuse bronchial wall thickening with mild centrilobular and paraseptal emphysema.  Cardiac Studies:   Echocardiogram 03/08/2019:  Left ventricle cavity is normal in size and wall thickness. Normal global  wall motion. Normal LV systolic function with EF 55%. Normal diastolic  filling pattern.  Mild (Grade I) mitral regurgitation.  Mild tricuspid regurgitation. Estimated pulmonary artery systolic pressure  is 22 mmHg.  Lexiscan (Walking with mod Bruce)Tetrofosmin Stress Test  03/18/2019: Nondiagnostic ECG stress. Normal myocardial perfusion. All segments of left ventricle demonstrated normal wall motion and thickening.  Stress LV EF is normal 63%.  No previous exam available for comparison. Low risk study.   Assessment     ICD-10-CM   1. Atypical chest pain  R07.89   2. Coronary artery calcification  I25.10    I25.84   3. Dyspnea on exertion  R06.00   4. Hypercholesteremia  E78.00 rosuvastatin (CRESTOR) 10 MG tablet    EKG 01/16/2019: Normal sinus rhythm at the rate of 70 bpm, normal axis.  No evidence of ischemia, normal EKG.    Meds ordered this encounter  Medications  . rosuvastatin (CRESTOR) 10 MG tablet    Sig: Take 1 tablet (10  mg total) by mouth daily.    Dispense:  90 tablet    Refill:  1    Medications Discontinued During This Encounter  Medication Reason  . pantoprazole (PROTONIX) 40 MG tablet Error  . rosuvastatin (CRESTOR) 10 MG tablet Reorder    Recommendations:   JAKIAH BIENAIME  is a 64 y.o. Caucasian female with 40+-pack-year history of smoking quit in 2015, hyperlipidemia, GERD presented to the emergency room on 01/16/2019 with chest pain, but could not be seen by anyone EKG was normal.  I had seen her 6 weeks ago, patient instructed to start ASA  81mg  q daily for prophylaxis.  She was also started on statin.  I reviewed the results of the stress test which is low risk and also echocardiogram which is essentially normal done in January 2021.  Her dyspnea is related to underlying COPD and emphysema.  I reviewed her labs, LDL is improved from 141 to 115 and non-HDL cholesterol has improved from 176 to space 133, very close to goal.  At this point we could continue present medical therapy, advised her to watch her diet and try to get non-HDL cholesterol to less than 130.  In view of coronary calcification, I would recommend lifelong statin therapy.  10 Lbs weight loss discussed and patient is very pleased and has also made lifestyle changes.  Otherwise stable from cardiac standpoint, unless she has any new symptoms, I will see her back on a as needed basis.  February 2021, MD, [facility] 04/04/2019, 3:58 PM [facility] [place] Office: [phone]

## 2019-04-10 ENCOUNTER — Other Ambulatory Visit: Payer: Self-pay | Admitting: Family Medicine

## 2019-04-10 DIAGNOSIS — F17201 Nicotine dependence, unspecified, in remission: Secondary | ICD-10-CM

## 2019-04-12 ENCOUNTER — Ambulatory Visit (INDEPENDENT_AMBULATORY_CARE_PROVIDER_SITE_OTHER): Payer: BLUE CROSS/BLUE SHIELD

## 2019-04-12 ENCOUNTER — Other Ambulatory Visit: Payer: Self-pay

## 2019-04-12 ENCOUNTER — Ambulatory Visit (INDEPENDENT_AMBULATORY_CARE_PROVIDER_SITE_OTHER): Payer: BLUE CROSS/BLUE SHIELD | Admitting: Family Medicine

## 2019-04-12 ENCOUNTER — Encounter: Payer: Self-pay | Admitting: Family Medicine

## 2019-04-12 VITALS — BP 110/72 | HR 96 | Ht 66.0 in | Wt 174.0 lb

## 2019-04-12 DIAGNOSIS — M545 Low back pain, unspecified: Secondary | ICD-10-CM

## 2019-04-12 DIAGNOSIS — M25511 Pain in right shoulder: Secondary | ICD-10-CM

## 2019-04-12 DIAGNOSIS — M546 Pain in thoracic spine: Secondary | ICD-10-CM

## 2019-04-12 DIAGNOSIS — M50322 Other cervical disc degeneration at C5-C6 level: Secondary | ICD-10-CM | POA: Diagnosis not present

## 2019-04-12 DIAGNOSIS — M542 Cervicalgia: Secondary | ICD-10-CM | POA: Diagnosis not present

## 2019-04-12 DIAGNOSIS — M25512 Pain in left shoulder: Secondary | ICD-10-CM

## 2019-04-12 NOTE — Patient Instructions (Signed)
Thank you for coming in today. Get xray today.  Plan for PT in 1-2 weeks after COVID-19 vaccine.  If [facility] pain management is delaying backup plan is a local group here in [place]. Keep me updated.  Recheck in 6 weeks if not improving.  Return sooner if needed.

## 2019-04-12 NOTE — Progress Notes (Signed)
I, Wendy Poet, LAT, ATC, am serving as scribe for Dr. Lynne Leader.  Samantha Cox is a 64 y.o. female who presents to [facility] today for f/u of low back pain and L leg pain.  She was most recently seen by Dr. Georgina Snell on 03/05/19 and was referred for B L4-5 facet injections that she had on 03/14/19.  She has had multiple lumbar epidurals in the past, received a L GT injection on 01/29/19 and has been referred to the [facility] on 02/27/19.  She was provided a HEP at her last visit consisting of low back/trunk mobility, pelvic clocks, bridges, glute and hip flexor stretching.  Since her last visit, pt reports she feels slightly better, the injection helped some but her more acute issues are still not any better.  She notes persistent problems with her neck mid back low back and shoulders.  She is interested in doing physical therapy as she will get the COVID-19 vaccine starting tomorrow. She is scheduled to have a repeat CT scan of her chest on March 16.  Pertinent review of systems: No fevers or chills.  Relevant historical information: Former smoker   Exam:  BP 110/72 (BP Location: Left Arm, Patient Position: Sitting, Cuff Size: Normal)   Pulse 96   Ht 5\' 6"  (1.676 m)   Wt 174 lb (78.9 kg)   SpO2 96%   BMI 28.08 kg/m  General: Well Developed, well nourished, and in no acute distress.   MSK:  C-spine: Normal cervical motion.  Upper extremity motion and strength is intact. T-spine slight kyphosis.  Nontender midline. L-spine: Tender palpation left lumbar paraspinal musculature otherwise nontender.  Decreased motion.     Lab and Radiology Results CT scan chest focus on T-spine from January 2020 images personally independently reviewed. Slight thoracic kyphosis present.  Degenerative changes present no acute fractures.  X-ray images obtained today personally and independently reviewed.  C-spine: Significant DDD at C5-6 and C6-7  L-spine:  Scoliosis pattern convex right degenerative changes throughout.  No acute fractures.  Await formal radiology review  Assessment and Plan: 64 y.o. female with chronic pain across C-spine T-spine and L-spine along with some shoulder pain.  Patient has multiple different pain complaints.  She has had trials of interventional injections which have helped some.  Most recently she had facet injections which have helped her chronic low back pain.  She still has some acute left-sided low back pain she attributes to muscle dysfunction.  Fundamentally neck step is physical therapy.  Now that she will get her COVID-19 vaccine she is willing to consider physical therapy with an aim for the modalities and interventions that physical therapy can provide.  She is not interested in just doing a bunch of exercises and physical therapy as she is doing that quite well on her own at home.  Additionally patient would likely benefit from pain management.  She has been referred to [facility] pain management previously but is still waiting to get scheduled.  Offered local referral if [facility] falls through.  She is willing to consider that but would like to continue working with [facility] for now if possible.  Check back with me in about 6 weeks.   PDMP not reviewed this encounter. Orders Placed This Encounter  Procedures  . DG Cervical Spine Complete    Standing Status:   Future    Number of Occurrences:   1    Standing Expiration Date:   06/11/2020  Order Specific Question:   Reason for Exam (SYMPTOM  OR DIAGNOSIS REQUIRED)    Answer:   eval pain cspine    Order Specific Question:   Preferred imaging location?    Answer:   [facility]    Order Specific Question:   Radiology Contrast Protocol - do NOT remove file path    Answer:   \\[email or web address]  . DG Lumbar Spine Complete    Standing Status:   Future    Number of Occurrences:   1    Standing Expiration Date:   06/11/2020     Order Specific Question:   Reason for Exam (SYMPTOM  OR DIAGNOSIS REQUIRED)    Answer:   eval chronic low back pain    Order Specific Question:   Preferred imaging location?    Answer:   [facility]    Order Specific Question:   Radiology Contrast Protocol - do NOT remove file path    Answer:   \\[email or web address]  . Ambulatory referral to Physical Therapy    Referral Priority:   Routine    Referral Type:   Physical Medicine    Referral Reason:   Specialty Services Required    Requested Specialty:   Physical Therapy   No orders of the defined types were placed in this encounter.    Discussed warning signs or symptoms. Please see discharge instructions. Patient expresses understanding.   The above documentation has been reviewed and is accurate and complete [name]

## 2019-04-13 DIAGNOSIS — Z23 Encounter for immunization: Secondary | ICD-10-CM | POA: Diagnosis not present

## 2019-04-15 ENCOUNTER — Encounter: Payer: Self-pay | Admitting: Family Medicine

## 2019-04-15 NOTE — Progress Notes (Signed)
X-ray lumbar spine shows multilevel arthritis and some scoliosis.

## 2019-04-15 NOTE — Progress Notes (Signed)
X-ray cervical spine shows some arthritis.  No acute fractures.

## 2019-04-23 ENCOUNTER — Ambulatory Visit
Admission: RE | Admit: 2019-04-23 | Discharge: 2019-04-23 | Disposition: A | Discharge status: Home / Self Care | Payer: BLUE CROSS/BLUE SHIELD | Source: Ambulatory Visit | Attending: Family Medicine | Admitting: Family Medicine

## 2019-04-23 ENCOUNTER — Other Ambulatory Visit: Payer: Self-pay

## 2019-04-23 DIAGNOSIS — F17201 Nicotine dependence, unspecified, in remission: Secondary | ICD-10-CM

## 2019-04-23 DIAGNOSIS — Z87891 Personal history of nicotine dependence: Secondary | ICD-10-CM | POA: Diagnosis not present

## 2019-04-26 ENCOUNTER — Ambulatory Visit: Payer: BC Managed Care – PPO | Attending: Family Medicine | Admitting: Physical Therapy

## 2019-04-26 ENCOUNTER — Encounter: Payer: Self-pay | Admitting: Family Medicine

## 2019-04-26 ENCOUNTER — Encounter: Payer: Self-pay | Admitting: Physical Therapy

## 2019-04-26 ENCOUNTER — Other Ambulatory Visit: Payer: Self-pay

## 2019-04-26 DIAGNOSIS — G8929 Other chronic pain: Secondary | ICD-10-CM | POA: Diagnosis not present

## 2019-04-26 DIAGNOSIS — M542 Cervicalgia: Secondary | ICD-10-CM | POA: Diagnosis not present

## 2019-04-26 DIAGNOSIS — M25512 Pain in left shoulder: Secondary | ICD-10-CM | POA: Diagnosis not present

## 2019-04-26 DIAGNOSIS — M545 Low back pain, unspecified: Secondary | ICD-10-CM

## 2019-04-26 DIAGNOSIS — M546 Pain in thoracic spine: Secondary | ICD-10-CM | POA: Diagnosis not present

## 2019-04-26 DIAGNOSIS — M25511 Pain in right shoulder: Secondary | ICD-10-CM | POA: Insufficient documentation

## 2019-04-26 NOTE — Therapy (Signed)
[place], [place], [address] Phone: [phone]   Fax:  [phone]  Physical Therapy Evaluation  Patient Details  Name: Samantha Cox MRN: [id number] Date of Birth: [birth year] Referring Provider (PT): Lynne Leader, MD   Encounter Date: 04/26/2019  PT End of Session - 04/26/19 1211    Visit Number  1    Number of Visits  17    Date for PT Re-Evaluation  06/21/19    Authorization Type  30 VL    PT Start Time  1102    PT Stop Time  1153    PT Time Calculation (min)  51 min    Activity Tolerance  Patient tolerated treatment well    Behavior During Therapy  [facility] for tasks assessed/performed       Past Medical History:  Diagnosis Date  . History of tobacco use   . Routine general medical examination at a health care facility   . Screening for lipoid disorders     Past Surgical History:  Procedure Laterality Date  . OTHER SURGICAL HISTORY     Birthmark removed from forehead 7th grade    There were no vitals filed for this visit.   Subjective Assessment - 04/26/19 1215    Subjective  Most pain around the Lt SIJ that started with bursitis at least 5 years ago but pain became more intense 2-3 years ago on Left side.About a year ago- Neck pain that travels into shoulders, L>R. 2-3 yr ago began having vertigo but was screened at [facility] with no diagnosis, still gets dizzy when turning head quickly. Stress incontinence noted. 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    Pertinent History  X-rays positive for lumbar destro-scoliosis and cervical DDD    Patient Stated Goals  Patinet states that she would like to reduce pain, especially on the left side    Currently in Pain?  Yes    Pain Score  3     Pain Location  Back    Pain Orientation  Left    Pain Descriptors / Indicators  Aching;Discomfort;Headache    Pain Type  Chronic pain;Acute pain    Pain Onset  More than a month  ago    Pain Frequency  Constant    Aggravating Factors   riding, standing all day    Pain Relieving Factors  Rest    Multiple Pain Sites  Yes    Pain Location  Neck    Pain Onset  Other (comment)   Pain started within the past couple of months   Aggravating Factors   Rotation    Pain Location  Shoulder    Pain Onset  Other (comment)   Pain started within the past couple of months        [facility] PT Assessment - 04/26/19 0001      Assessment   Medical Diagnosis  bil GHJ, neck, back pain    Referring Provider (PT)  Lynne Leader, MD    Onset Date/Surgical Date  --   5 years ago   Prior Therapy  yes, not this year      Precautions   Precaution Comments  lumbar dextroscoliosis      Restrictions   Weight Bearing Restrictions  No      Balance Screen   Has the patient fallen in the past 6 months  No      Home Environment   Living Environment  Private residence      Prior Function   Level of Independence  Independent    Vocation  Full time employment    Vocation Requirements  trains horses & riding      Cognition   Overall Cognitive Status  Within Functional Limits for tasks assessed      Observation/Other Assessments   Focus on Therapeutic Outcomes (FOTO)   --   N/A 4 body parts     Sensation   Additional Comments  WFL      Posture/Postural Control   Posture Comments  rounded shoulders, forward head      ROM / Strength   AROM / PROM / Strength  AROM      AROM   AROM Assessment Site  Cervical;Lumbar    Cervical - Right Side Bend  45    Cervical - Left Side Bend  36    Cervical - Right Rotation  72    Cervical - Left Rotation  42    Lumbar Flexion  70    Lumbar Extension  21    Lumbar - Right Side Bend  30    Lumbar - Left Side Bend  22    Lumbar - Right Rotation  80%    Lumbar - Left Rotation  60%      Palpation   Palpation comment  TTP Lt glut max/med/min; bil upper traps TTP                Objective measurements completed on examination: See  above findings.      [facility] Adult PT Treatment/Exercise - 04/26/19 0001      Exercises   Exercises  Other Exercises    Other Exercises   see scanned pt instructions       Manual Therapy   Manual Therapy  Soft tissue mobilization    Manual therapy comments  skilled palpation and monitoring during TPDN    Soft tissue mobilization  Lt glut max superior fibers, bil upper traps       Trigger Point Dry Needling - 04/26/19 0001    Consent Given?  Yes    Education Handout Provided  --   verbal education   Muscles Treated Head and Neck  Upper trapezius    Muscles Treated Back/Hip  Gluteus maximus    Upper Trapezius Response  Twitch reponse elicited;Palpable increased muscle length   bil   Gluteus Maximus Response  Twitch response elicited;Palpable increased muscle length   Left          PT Education - 04/26/19 2116    Education Details  anatomy of condition, POC, HEP, exercise form/rationale, TPDN & expected outcomes    Person(s) Educated  Patient    Methods  Explanation;Demonstration;Tactile cues;Verbal cues;Handout    Comprehension  Verbalized understanding;Returned demonstration;Verbal cues required;Tactile cues required;Need further instruction       PT Short Term Goals - 04/26/19 1240      PT SHORT TERM GOAL #1   Title  Pt will be able to complete HEPs with an at worst pain of 6/10    Baseline  Pt. indicated that her worst pain was an 8/10    Time  3    Period  Weeks    Status  New    Target Date  05/17/19      PT SHORT TERM GOAL #2   Title  Pt. will be able to demonstrate and verbalize proper posture    Baseline  Pt. demonstrates a rounded shoulder  posture    Time  3    Period  Weeks    Status  New    Target Date  05/17/19        PT Long Term Goals - 04/26/19 1249      PT LONG TERM GOAL #1   Title  Patient will achieve a cervical rotation bilaterally of </= 60 degrees    Baseline  Patient demonstrated a Lt. cervical rotation of 42 degrees    Time  8     Period  Weeks    Status  New    Target Date  06/21/19      PT LONG TERM GOAL #2   Title  Patient will report an at worst pain 3/10    Baseline  Patients worst pain is an 8/10    Time  8    Period  Weeks    Status  New    Target Date  06/21/19      PT LONG TERM GOAL #3   Title  Patient will report no more than 2 HA/week    Baseline  Patient reports multiple HA throughout the week    Time  8    Period  Weeks    Status  New    Target Date  06/21/19      PT LONG TERM GOAL #4   Title  Independent with long term HEP for continued care after d/c    Baseline  will progress and establish as appropriate    Time  8    Period  Weeks    Status  New    Target Date  06/21/19             Plan - 04/26/19 1226    Clinical Impression Statement  Patient presents to the clinc with cervical, Lt. shoulder, and Lt. lower back pain- lumbar dextroscoliosis. She stated that her lower back pain is chronic, but the cervical and shoulder issues started a couple of months ago. She indicates difficulty with bending to the left side and is limited in left cervical rotation. Patient stated that her current pain was a 3/10 and at worst 8/10. She is concerned about the degeneration in her cervical region. The patient responded favorably to dry needling in the lumbar and upper trap region. She was provided HEPs to increase cervical ROM and strength. Patient would benefit from skilled PT in order to improve pain and increase ROM in the cervical and lumbar region.    Personal Factors and Comorbidities  Age;Behavior Pattern;Profession    Examination-Activity Limitations  Other;Carry;Continence;Sit;Sleep;Squat;Stand   Patient indicated that she has diffculty at work with riding horses and difficulty with cervical rotation   Examination-Participation Restrictions  Other   Riding horses and working at desk   Stability/Clinical Decision Making  Evolving/Moderate complexity    Clinical Decision Making  Moderate     Rehab Potential  Fair    PT Frequency  2x / week    PT Duration  8 weeks    PT Treatment/Interventions  Moist Heat;Therapeutic activities;Therapeutic exercise;Balance training;Neuromuscular re-education;Manual techniques;Dry needling;Passive range of motion;Joint Manipulations;Iontophoresis 4mg /ml Dexamethasone;ADLs/Self Care Home Management;Cryotherapy;Electrical Stimulation;Traction;Ultrasound;Gait training;Patient/family education;Taping;Vestibular;Spinal Manipulations    PT Next Visit Plan  Revisit patient HEP. Work on cervical strengthening and lumbar ROM    PT Home Exercise Plan  chin tucks, cervical rotation snag, prone press ups, qped thoracic rotation    Consulted and Agree with Plan of Care  Patient       Patient will benefit  from skilled therapeutic intervention in order to improve the following deficits and impairments:  Decreased range of motion, Dizziness, Pain, Decreased balance, Hypomobility, Decreased strength, Decreased mobility, Increased muscle spasms, Improper body mechanics, Postural dysfunction, Impaired flexibility, Decreased activity tolerance  Visit Diagnosis: Cervicalgia - Plan: PT plan of care cert/re-cert  Chronic left shoulder pain - Plan: PT plan of care cert/re-cert  Chronic right shoulder pain - Plan: PT plan of care cert/re-cert  Pain in thoracic spine - Plan: PT plan of care cert/re-cert  Chronic bilateral low back pain without sciatica - Plan: PT plan of care cert/re-cert     Problem List Patient Active Problem List   Diagnosis Date Noted  . Facet arthritis of lumbar region 03/05/2019  . Chronic rhinitis 12/19/2018  . Shortness of breath 12/19/2018  . Ex-cigarette smoker 12/19/2018  . Greater trochanteric bursitis of right hip 07/10/2018  . Strain of left piriformis muscle 02/15/2018  . Degenerative lumbar spinal stenosis 10/17/2016  . Leg cramping 09/19/2016  . Morton's neuroma of left foot 05/24/2016  . Piriformis syndrome of left side  05/13/2015  . Greater trochanteric bursitis of left hip 11/25/2014  . Gluteal tendinitis of left buttock 11/25/2014  . Degenerative arthritis of hip 01/16/2014  . Acetabular labrum tear 01/16/2014    Cato Mulligan, SPT Baptiste Littler C. Vyom Brass PT, DPT 04/26/19 9:40 PM  During this treatment session, the therapist was present, participating in and directing the treatment.   [facility] [address] [place], [place], [address] Phone: [phone]   Fax:  [phone]  Name: Samantha Cox MRN: [id number] Date of Birth: 06-Mar-1955

## 2019-04-29 ENCOUNTER — Ambulatory Visit: Payer: BC Managed Care – PPO | Admitting: Physical Therapy

## 2019-04-29 ENCOUNTER — Encounter: Payer: Self-pay | Admitting: Physical Therapy

## 2019-04-29 ENCOUNTER — Other Ambulatory Visit: Payer: Self-pay

## 2019-04-29 DIAGNOSIS — M25512 Pain in left shoulder: Secondary | ICD-10-CM | POA: Diagnosis not present

## 2019-04-29 DIAGNOSIS — M545 Low back pain: Secondary | ICD-10-CM | POA: Diagnosis not present

## 2019-04-29 DIAGNOSIS — M25511 Pain in right shoulder: Secondary | ICD-10-CM

## 2019-04-29 DIAGNOSIS — M546 Pain in thoracic spine: Secondary | ICD-10-CM

## 2019-04-29 DIAGNOSIS — G8929 Other chronic pain: Secondary | ICD-10-CM | POA: Diagnosis not present

## 2019-04-29 DIAGNOSIS — M542 Cervicalgia: Secondary | ICD-10-CM | POA: Diagnosis not present

## 2019-04-29 NOTE — Therapy (Signed)
[place], [place], [address] Phone: [phone]   Fax:  [phone]  Physical Therapy Treatment  Patient Details  Name: Samantha Cox MRN: [id number] Date of Birth: 11-29-1955 Referring Provider (PT): Lynne Leader, MD   Encounter Date: 04/29/2019  PT End of Session - 04/29/19 1259    Visit Number  2    Number of Visits  17    Date for PT Re-Evaluation  06/21/19    Authorization Type  30 VL    PT Start Time  1150    PT Stop Time  1247   34 min direct tx. time, time spent for dry needling and MHP not included in direct minutes   PT Time Calculation (min)  57 min    Activity Tolerance  Patient tolerated treatment well    Behavior During Therapy  [facility] for tasks assessed/performed       Past Medical History:  Diagnosis Date  . History of tobacco use   . Routine general medical examination at a health care facility   . Screening for lipoid disorders     Past Surgical History:  Procedure Laterality Date  . OTHER SURGICAL HISTORY     Birthmark removed from forehead 7th grade    There were no vitals filed for this visit.  Subjective Assessment - 04/29/19 1151    Subjective  Pt. reports dry needling at eval was helpful for shoulder/upper trap and left hip pain-she reports symptoms (for back/hip) worse on right and requests focus on this (right) region today. Also noting a "knot" in left thoracic region.    Pertinent History  X-rays positive for lumbar destro-scoliosis and cervical DDD    Patient Stated Goals  Patinet states that she would like to reduce pain, especially on the left side    Currently in Pain?  Yes    Pain Score  6     Pain Location  Back    Pain Orientation  Right    Pain Descriptors / Indicators  Dull    Pain Type  Chronic pain    Pain Onset  More than a month ago    Pain Frequency  Constant    Aggravating Factors   sitting    Pain Relieving Factors  no eases noted    Effect of  Pain on Daily Activities  limits positional tolerance                       [facility] Adult PT Treatment/Exercise - 04/29/19 0001      Exercises   Exercises  Neck;Lumbar      Neck Exercises: Seated   Neck Retraction  15 reps    Other Seated Exercise  self-SNAG rotation using towel x 10 ea. way bilat.      Lumbar Exercises: Stretches   Passive Hamstring Stretch  Right;Left;3 reps;30 seconds    Press Ups  15 reps    Press Ups Limitations  on elbows    Piriformis Stretch  Right;3 reps;30 seconds    Other Lumbar Stretch Exercise  sidelying manual left QL stretch 3x30 sec      Lumbar Exercises: Quadruped   Other Quadruped Lumbar Exercises  trunk rotation x 10 ea. way bilat.      Modalities   Modalities  Moist Heat      Moist Heat Therapy   Number Minutes Moist Heat  10 Minutes    Moist Heat Location  --   thoracic  and lumbar in prone     Manual Therapy   Soft tissue mobilization  right gluteal region incl. also IASTM with foam roll, STM also left lower trapezius and rhomboid region       Trigger Point Dry Needling - 04/29/19 0001    Consent Given?  Yes    Muscles Treated Upper Quadrant  Lower trapezius   left side   Muscles Treated Back/Hip  Gluteus medius;Gluteus maximus;Piriformis;Erector spinae    Dry Needling Comments  for hip muscles needles right gluteals and left lumbar longissimus at L4-5 region as well as left proximal glut max, 32 gauge 30 mm needles used for lower trapezius, 32 gauge 60 mm needles used for hip and back           PT Education - 04/29/19 1257    Education Details  HEP review, exercises, dry needling    Person(s) Educated  Patient    Methods  Explanation;Demonstration;Verbal cues    Comprehension  Verbalized understanding;Returned demonstration       PT Short Term Goals - 04/26/19 1240      PT SHORT TERM GOAL #1   Title  Pt will be able to complete HEPs with an at worst pain of 6/10    Baseline  Pt. indicated that her worst  pain was an 8/10    Time  3    Period  Weeks    Status  New    Target Date  05/17/19      PT SHORT TERM GOAL #2   Title  Pt. will be able to demonstrate and verbalize proper posture    Baseline  Pt. demonstrates a rounded shoulder posture    Time  3    Period  Weeks    Status  New    Target Date  05/17/19        PT Long Term Goals - 04/26/19 1249      PT LONG TERM GOAL #1   Title  Patient will achieve a cervical rotation bilaterally of </= 60 degrees    Baseline  Patient demonstrated a Lt. cervical rotation of 42 degrees    Time  8    Period  Weeks    Status  New    Target Date  06/21/19      PT LONG TERM GOAL #2   Title  Patient will report an at worst pain 3/10    Baseline  Patients worst pain is an 8/10    Time  8    Period  Weeks    Status  New    Target Date  06/21/19      PT LONG TERM GOAL #3   Title  Patient will report no more than 2 HA/week    Baseline  Patient reports multiple HA throughout the week    Time  8    Period  Weeks    Status  New    Target Date  06/21/19      PT LONG TERM GOAL #4   Title  Independent with long term HEP for continued care after d/c    Baseline  will progress and establish as appropriate    Time  8    Period  Weeks    Status  New    Target Date  06/21/19            Plan - 04/29/19 1302    Clinical Impression Statement  Good response to initial tx. at eval for shoulder/upper trapezius and  left hip/lumbar symptoms. Reviewed HEP and tx. focus righ side for hip pain symptoms as well as STM and brief dry needling for trigger point noted in left lower trapezius region. Tx. well-tolerated-expect may take 1-2 days to note tx. results so will await further response by next appointment.    Personal Factors and Comorbidities  Age;Behavior Pattern;Profession    Examination-Activity Limitations  Other;Carry;Continence;Sit;Sleep;Squat;Stand    Examination-Participation Restrictions  Other    Stability/Clinical Decision Making   Evolving/Moderate complexity    Clinical Decision Making  Moderate    Rehab Potential  Fair    PT Frequency  2x / week    PT Duration  8 weeks    PT Treatment/Interventions  Moist Heat;Therapeutic activities;Therapeutic exercise;Balance training;Neuromuscular re-education;Manual techniques;Dry needling;Passive range of motion;Joint Manipulations;Iontophoresis 4mg /ml Dexamethasone;ADLs/Self Care Home Management;Cryotherapy;Electrical Stimulation;Traction;Ultrasound;Gait training;Patient/family education;Taping;Vestibular;Spinal Manipulations    PT Next Visit Plan  Check response to needling/tx. for right hip and thoracic region, continue/progress postural and lumbopelvic stabilization, stretches, manual, further dry needling prn    PT Home Exercise Plan  chin tucks, cervical rotation snag, prone press ups, qped thoracic rotation    Consulted and Agree with Plan of Care  Patient       Patient will benefit from skilled therapeutic intervention in order to improve the following deficits and impairments:  Decreased range of motion, Dizziness, Pain, Decreased balance, Hypomobility, Decreased strength, Decreased mobility, Increased muscle spasms, Improper body mechanics, Postural dysfunction, Impaired flexibility, Decreased activity tolerance  Visit Diagnosis: Cervicalgia  Chronic left shoulder pain  Chronic right shoulder pain  Pain in thoracic spine  Chronic bilateral low back pain without sciatica     Problem List Patient Active Problem List   Diagnosis Date Noted  . Facet arthritis of lumbar region 03/05/2019  . Chronic rhinitis 12/19/2018  . Shortness of breath 12/19/2018  . Ex-cigarette smoker 12/19/2018  . Greater trochanteric bursitis of right hip 07/10/2018  . Strain of left piriformis muscle 02/15/2018  . Degenerative lumbar spinal stenosis 10/17/2016  . Leg cramping 09/19/2016  . Morton's neuroma of left foot 05/24/2016  . Piriformis syndrome of left side 05/13/2015  .  Greater trochanteric bursitis of left hip 11/25/2014  . Gluteal tendinitis of left buttock 11/25/2014  . Degenerative arthritis of hip 01/16/2014  . Acetabular labrum tear 01/16/2014    14/11/2013, PT, DPT 04/29/19 1:09 PM  [facility] [address] [place], [place], [place] Phone: [phone]   Fax:  [phone]  Name: Samantha Cox MRN: Bernadette Hoit Date of Birth: [birth year]

## 2019-04-30 DIAGNOSIS — H524 Presbyopia: Secondary | ICD-10-CM | POA: Diagnosis not present

## 2019-04-30 DIAGNOSIS — H2513 Age-related nuclear cataract, bilateral: Secondary | ICD-10-CM | POA: Diagnosis not present

## 2019-05-06 ENCOUNTER — Encounter: Payer: Self-pay | Admitting: Physical Therapy

## 2019-05-06 ENCOUNTER — Other Ambulatory Visit: Payer: Self-pay

## 2019-05-06 ENCOUNTER — Ambulatory Visit: Payer: BC Managed Care – PPO | Admitting: Physical Therapy

## 2019-05-06 DIAGNOSIS — M546 Pain in thoracic spine: Secondary | ICD-10-CM | POA: Diagnosis not present

## 2019-05-06 DIAGNOSIS — G8929 Other chronic pain: Secondary | ICD-10-CM | POA: Diagnosis not present

## 2019-05-06 DIAGNOSIS — M542 Cervicalgia: Secondary | ICD-10-CM | POA: Diagnosis not present

## 2019-05-06 DIAGNOSIS — M545 Low back pain: Secondary | ICD-10-CM | POA: Diagnosis not present

## 2019-05-06 DIAGNOSIS — M25511 Pain in right shoulder: Secondary | ICD-10-CM | POA: Diagnosis not present

## 2019-05-06 DIAGNOSIS — M25512 Pain in left shoulder: Secondary | ICD-10-CM | POA: Diagnosis not present

## 2019-05-06 DIAGNOSIS — Z23 Encounter for immunization: Secondary | ICD-10-CM | POA: Diagnosis not present

## 2019-05-07 ENCOUNTER — Other Ambulatory Visit: Payer: Self-pay | Admitting: Family Medicine

## 2019-05-07 NOTE — Therapy (Signed)
[facility] Outpatient Rehabilitation [facility] [address] [place], [place], [address] Phone: [phone]   Fax:  [phone]  Physical Therapy Treatment  Patient Details  Name: Samantha Cox MRN: [id number] Date of Birth: 10/12/1955 Referring Provider (PT): [name], MD   Encounter Date: 05/06/2019  PT End of Session - 05/06/19 1732    Visit Number  3    Number of Visits  17    Date for PT Re-Evaluation  06/21/19    Authorization Type  30 VL    PT Start Time  1725   pt arrived late   PT Stop Time  1759    PT Time Calculation (min)  34 min    Activity Tolerance  Patient tolerated treatment well    Behavior During Therapy  [facility] for tasks assessed/performed       Past Medical History:  Diagnosis Date  . History of tobacco use   . Routine general medical examination at a health care facility   . Screening for lipoid disorders     Past Surgical History:  Procedure Laterality Date  . OTHER SURGICAL HISTORY     Birthmark removed from forehead 7th grade    There were no vitals filed for this visit.  Subjective Assessment - 05/06/19 1731    Subjective  Needling has been very helpful. Rt proximal HS are killing me, sitting is horrible. neck is better, just somewhat stiff.    Patient Stated Goals  Patinet states that she would like to reduce pain, especially on the left side    Currently in Pain?  Yes    Pain Score  9     Pain Location  Buttocks    Pain Orientation  Right    Pain Descriptors / Indicators  Sore         [facility] PT Assessment - 05/06/19 0001      AROM   Cervical - Right Rotation  70    Cervical - Left Rotation  64                   [facility] Adult PT Treatment/Exercise - 05/06/19 0001      Lumbar Exercises: Stretches   Active Hamstring Stretch  Right;5 reps    Passive Hamstring Stretch  Right;30 seconds    Passive Hamstring Stretch Limitations  midline & lateral bias    Other Lumbar Stretch Exercise  HS foam roll    Other Lumbar Stretch Exercise  ITB foam roll      Manual Therapy   Manual Therapy  Soft tissue mobilization    Manual therapy comments  skilled palpation and monitoring during TPDN    Soft tissue mobilization  Rt HS & Lt glut max,        Trigger Point Dry Needling - 05/06/19 0001    Muscles Treated Lower Quadrant  Hamstring    Hamstring Response  Twitch response elicited;Palpable increased muscle length   Right   Gluteus Maximus Response  Twitch response elicited;Palpable increased muscle length   Left            PT Short Term Goals - 04/26/19 1240      PT SHORT TERM GOAL #1   Title  Pt will be able to complete HEPs with an at worst pain of 6/10    Baseline  Pt. indicated that her worst pain was an 8/10    Time  3    Period  Weeks    Status  New  Target Date  05/17/19      PT SHORT TERM GOAL #2   Title  Pt. will be able to demonstrate and verbalize proper posture    Baseline  Pt. demonstrates a rounded shoulder posture    Time  3    Period  Weeks    Status  New    Target Date  05/17/19        PT Long Term Goals - 04/26/19 1249      PT LONG TERM GOAL #1   Title  Patient will achieve a cervical rotation bilaterally of </= 60 degrees    Baseline  Patient demonstrated a Lt. cervical rotation of 42 degrees    Time  8    Period  Weeks    Status  New    Target Date  06/21/19      PT LONG TERM GOAL #2   Title  Patient will report an at worst pain 3/10    Baseline  Patients worst pain is an 8/10    Time  8    Period  Weeks    Status  New    Target Date  06/21/19      PT LONG TERM GOAL #3   Title  Patient will report no more than 2 HA/week    Baseline  Patient reports multiple HA throughout the week    Time  8    Period  Weeks    Status  New    Target Date  06/21/19      PT LONG TERM GOAL #4   Title  Independent with long term HEP for continued care after d/c    Baseline  will progress and establish as appropriate    Time  8    Period  Weeks     Status  New    Target Date  06/21/19            Plan - 05/07/19 0736    Clinical Impression Statement  Significant twitching in Rt biceps femoris and semimembranosus reducing concordant pain when sitting. No s/s of bursitis or proximal HS tearing. Reviewed HS stretches which she was not doing due to pain. Will bring her foam roller to her next visit for [place] to work with.    PT Treatment/Interventions  Moist Heat;Therapeutic activities;Therapeutic exercise;Balance training;Neuromuscular re-education;Manual techniques;Dry needling;Passive range of motion;Joint Manipulations;Iontophoresis 4mg /ml Dexamethasone;ADLs/Self Care Home Management;Cryotherapy;Electrical Stimulation;Traction;Ultrasound;Gait training;Patient/family education;Taping;Vestibular;Spinal Manipulations    PT Next Visit Plan  DN PRN, foam roll exercises    PT Home Exercise Plan  chin tucks, cervical rotation snag, prone press ups, qped thoracic rotation, HS stretching    Consulted and Agree with Plan of Care  Patient       Patient will benefit from skilled therapeutic intervention in order to improve the following deficits and impairments:  Decreased range of motion, Dizziness, Pain, Decreased balance, Hypomobility, Decreased strength, Decreased mobility, Increased muscle spasms, Improper body mechanics, Postural dysfunction, Impaired flexibility, Decreased activity tolerance  Visit Diagnosis: Cervicalgia  Chronic left shoulder pain  Chronic right shoulder pain  Pain in thoracic spine  Chronic bilateral low back pain without sciatica     Problem List Patient Active Problem List   Diagnosis Date Noted  . Facet arthritis of lumbar region 03/05/2019  . Chronic rhinitis 12/19/2018  . Shortness of breath 12/19/2018  . Ex-cigarette smoker 12/19/2018  . Greater trochanteric bursitis of right hip 07/10/2018  . Strain of left piriformis muscle 02/15/2018  . Degenerative lumbar spinal stenosis 10/17/2016  .  Leg  cramping 09/19/2016  . Morton's neuroma of left foot 05/24/2016  . Piriformis syndrome of left side 05/13/2015  . Greater trochanteric bursitis of left hip 11/25/2014  . Gluteal tendinitis of left buttock 11/25/2014  . Degenerative arthritis of hip 01/16/2014  . Acetabular labrum tear 01/16/2014    Suellyn Meenan C. Hiram Mciver PT, DPT 05/07/19 7:40 AM   [facility] [address] [place], [place], [address] Phone: [phone]   Fax:  [phone]  Name: MAUI AHART MRN: [id number] Date of Birth: 06-02-1955

## 2019-05-09 ENCOUNTER — Encounter: Payer: Self-pay | Admitting: Physical Therapy

## 2019-05-09 ENCOUNTER — Other Ambulatory Visit: Payer: Self-pay

## 2019-05-09 ENCOUNTER — Ambulatory Visit: Payer: BC Managed Care – PPO | Attending: Family Medicine | Admitting: Physical Therapy

## 2019-05-09 DIAGNOSIS — M542 Cervicalgia: Secondary | ICD-10-CM

## 2019-05-09 DIAGNOSIS — M546 Pain in thoracic spine: Secondary | ICD-10-CM | POA: Diagnosis not present

## 2019-05-09 DIAGNOSIS — M25512 Pain in left shoulder: Secondary | ICD-10-CM | POA: Diagnosis not present

## 2019-05-09 DIAGNOSIS — G8929 Other chronic pain: Secondary | ICD-10-CM | POA: Insufficient documentation

## 2019-05-09 DIAGNOSIS — M545 Low back pain: Secondary | ICD-10-CM | POA: Insufficient documentation

## 2019-05-09 DIAGNOSIS — M25511 Pain in right shoulder: Secondary | ICD-10-CM | POA: Insufficient documentation

## 2019-05-09 NOTE — Therapy (Signed)
[facility] Outpatient Rehabilitation [facility] [address] [place], [place], [address] Phone: [phone]   Fax:  [phone]  Physical Therapy Treatment  Patient Details  Name: Samantha Cox MRN: [id number] Date of Birth: 1955/11/23 Referring Provider (PT): [name], MD   Encounter Date: 05/09/2019  PT End of Session - 05/09/19 1552    Visit Number  4    Number of Visits  17    Date for PT Re-Evaluation  06/21/19    Authorization Type  30 VL    Authorization - Visit Number  3    Authorization - Number of Visits  30    PT Start Time  1513   pt arrived late   PT Stop Time  1550    PT Time Calculation (min)  37 min    Activity Tolerance  Patient tolerated treatment well    Behavior During Therapy  [facility] for tasks assessed/performed       Past Medical History:  Diagnosis Date  . History of tobacco use   . Routine general medical examination at a health care facility   . Screening for lipoid disorders     Past Surgical History:  Procedure Laterality Date  . OTHER SURGICAL HISTORY     Birthmark removed from forehead 7th grade    There were no vitals filed for this visit.  Subjective Assessment - 05/09/19 1515    Subjective  Still getting some pain from sitting, it's changed and hard to describe. There is a little bit different feeling in Rt HS.    Patient Stated Goals  Patinet states that she would like to reduce pain, especially on the left side    Currently in Pain?  Yes    Pain Score  4     Pain Location  Buttocks    Pain Orientation  Right    Pain Descriptors / Indicators  Sore         [facility] PT Assessment - 05/09/19 0001      Flexibility   Soft Tissue Assessment /Muscle Length  yes    Hamstrings  38   from 90/90                  [facility] Adult PT Treatment/Exercise - 05/09/19 0001      Lumbar Exercises: Stretches   Active Hamstring Stretch  Right;5 reps      Lumbar Exercises: Supine   Bridge Limitations  bil LE  turnout with DF    Other Supine Lumbar Exercises  dead bug extension with LE turnout, cues for ab set      Lumbar Exercises: Prone   Other Prone Lumbar Exercises  eccentric HS curls 5lb      Manual Therapy   Manual Therapy  Passive ROM    Manual therapy comments  skilled palpation and monitoring during TPDN    Soft tissue mobilization  Rt HS, Rt piriformis & IASTM to HS    Passive ROM  bil piriformis stretch       Trigger Point Dry Needling - 05/09/19 0001    Muscles Treated Back/Hip  Piriformis    Hamstring Response  Twitch response elicited;Palpable increased muscle length   Right   Piriformis Response  Twitch response elicited;Palpable increased muscle length   Right            PT Short Term Goals - 04/26/19 1240      PT SHORT TERM GOAL #1   Title  Pt will be able to complete  HEPs with an at worst pain of 6/10    Baseline  Pt. indicated that her worst pain was an 8/10    Time  3    Period  Weeks    Status  New    Target Date  05/17/19      PT SHORT TERM GOAL #2   Title  Pt. will be able to demonstrate and verbalize proper posture    Baseline  Pt. demonstrates a rounded shoulder posture    Time  3    Period  Weeks    Status  New    Target Date  05/17/19        PT Long Term Goals - 04/26/19 1249      PT LONG TERM GOAL #1   Title  Patient will achieve a cervical rotation bilaterally of </= 60 degrees    Baseline  Patient demonstrated a Lt. cervical rotation of 42 degrees    Time  8    Period  Weeks    Status  New    Target Date  06/21/19      PT LONG TERM GOAL #2   Title  Patient will report an at worst pain 3/10    Baseline  Patients worst pain is an 8/10    Time  8    Period  Weeks    Status  New    Target Date  06/21/19      PT LONG TERM GOAL #3   Title  Patient will report no more than 2 HA/week    Baseline  Patient reports multiple HA throughout the week    Time  8    Period  Weeks    Status  New    Target Date  06/21/19      PT LONG  TERM GOAL #4   Title  Independent with long term HEP for continued care after d/c    Baseline  will progress and establish as appropriate    Time  8    Period  Weeks    Status  New    Target Date  06/21/19            Plan - 05/09/19 1554    Clinical Impression Statement  twitches found in semimembranosus and semitendinosis which reduced pain in hamstrings. Rides horses many hours a day and we discussed postural demands and need for exercises to balance- exercises today performed with bil LE in external rotation position.    PT Treatment/Interventions  Moist Heat;Therapeutic activities;Therapeutic exercise;Balance training;Neuromuscular re-education;Manual techniques;Dry needling;Passive range of motion;Joint Manipulations;Iontophoresis 4mg /ml Dexamethasone;ADLs/Self Care Home Management;Cryotherapy;Electrical Stimulation;Traction;Ultrasound;Gait training;Patient/family education;Taping;Vestibular;Spinal Manipulations    PT Home Exercise Plan  chin tucks, cervical rotation snag, prone press ups, qped thoracic rotation, HS stretching; AHSS, piriformis stretch, bridge & dead bug with LE ext rot.    Consulted and Agree with Plan of Care  Patient       Patient will benefit from skilled therapeutic intervention in order to improve the following deficits and impairments:  Decreased range of motion, Dizziness, Pain, Decreased balance, Hypomobility, Decreased strength, Decreased mobility, Increased muscle spasms, Improper body mechanics, Postural dysfunction, Impaired flexibility, Decreased activity tolerance  Visit Diagnosis: Cervicalgia  Chronic left shoulder pain  Chronic right shoulder pain  Pain in thoracic spine  Chronic bilateral low back pain without sciatica     Problem List Patient Active Problem List   Diagnosis Date Noted  . Facet arthritis of lumbar region 03/05/2019  . Chronic rhinitis 12/19/2018  .  Shortness of breath 12/19/2018  . Ex-cigarette smoker 12/19/2018  .  Greater trochanteric bursitis of right hip 07/10/2018  . Strain of left piriformis muscle 02/15/2018  . Degenerative lumbar spinal stenosis 10/17/2016  . Leg cramping 09/19/2016  . Morton's neuroma of left foot 05/24/2016  . Piriformis syndrome of left side 05/13/2015  . Greater trochanteric bursitis of left hip 11/25/2014  . Gluteal tendinitis of left buttock 11/25/2014  . Degenerative arthritis of hip 01/16/2014  . Acetabular labrum tear 01/16/2014    Rexann Lueras C. Paz Fuentes PT, DPT 05/09/19 3:57 PM   [place] [facility] [address] [place], [place], [address] Phone: [phone]   Fax:  [phone]  Name: CRYSTIN LECHTENBERG MRN: [id number] Date of Birth: 1955-10-09

## 2019-05-15 ENCOUNTER — Telehealth: Payer: Self-pay | Admitting: Family Medicine

## 2019-05-15 MED ORDER — GABAPENTIN 300 MG PO CAPS: 300.0000 mg | ORAL_CAPSULE | Freq: Three times a day (TID) | ORAL | 1 refills | Status: DC | PRN

## 2019-05-15 MED ORDER — PREDNISONE 50 MG PO TABS: 50.0000 mg | ORAL_TABLET | Freq: Every day | ORAL | 0 refills | Status: DC

## 2019-05-15 NOTE — Telephone Encounter (Signed)
Pt had been doing PT and dry needling, thinks this has causing sciatic pain. Is requesting a phone/virtual visit with Dr. [name] today if possible ( I advised his schedule was full ). Looking for advise on whether she should continue her exercises and how to get relieve from her sciatic pain. Cell [phone]

## 2019-05-15 NOTE — Telephone Encounter (Signed)
I called Samantha Cox back. She developed pain radiating down both legs c/w prior sciatica after a PT session.  Overall she thinks physical therapy has been very helpful.  She denies weakness or numbness.  Plan for short course trial of prednisone along with a bit of gabapentin.  Back off on activities that are reproducing or exacerbating radicular pain.  Check back with me sooner if needed.  Otherwise scheduled April 20.

## 2019-05-16 ENCOUNTER — Encounter: Payer: Self-pay | Admitting: Physical Therapy

## 2019-05-16 ENCOUNTER — Other Ambulatory Visit: Payer: Self-pay

## 2019-05-16 ENCOUNTER — Ambulatory Visit: Payer: BC Managed Care – PPO | Admitting: Physical Therapy

## 2019-05-16 DIAGNOSIS — M546 Pain in thoracic spine: Secondary | ICD-10-CM

## 2019-05-16 DIAGNOSIS — M542 Cervicalgia: Secondary | ICD-10-CM

## 2019-05-16 DIAGNOSIS — M25512 Pain in left shoulder: Secondary | ICD-10-CM

## 2019-05-16 DIAGNOSIS — M545 Low back pain, unspecified: Secondary | ICD-10-CM

## 2019-05-16 DIAGNOSIS — G8929 Other chronic pain: Secondary | ICD-10-CM | POA: Diagnosis not present

## 2019-05-16 DIAGNOSIS — M25511 Pain in right shoulder: Secondary | ICD-10-CM | POA: Diagnosis not present

## 2019-05-16 NOTE — Therapy (Signed)
[place], [place], [address] Phone: [phone]   Fax:  [phone]  Physical Therapy Treatment  Patient Details  Name: Samantha Cox MRN: [id number] Date of Birth: 1955-10-25 Referring Provider (PT): Lynne Leader, MD   Encounter Date: 05/16/2019  PT End of Session - 05/16/19 1010    Visit Number  5    Number of Visits  17    Date for PT Re-Evaluation  06/21/19    Authorization Type  30 VL    PT Start Time  0945   arrived late   PT Stop Time  1015    PT Time Calculation (min)  30 min    Activity Tolerance  Patient tolerated treatment well    Behavior During Therapy  [facility] for tasks assessed/performed       Past Medical History:  Diagnosis Date  . History of tobacco use   . Routine general medical examination at a health care facility   . Screening for lipoid disorders     Past Surgical History:  Procedure Laterality Date  . OTHER SURGICAL HISTORY     Birthmark removed from forehead 7th grade    There were no vitals filed for this visit.  Subjective Assessment - 05/16/19 0947    Subjective  Pt. reports "in agony" with flare up of right piriformis region-reports was sore after last session and feels like tx. increased pain.    Pertinent History  X-rays positive for lumbar destro-scoliosis and cervical DDD    Currently in Pain?  Yes    Pain Score  3    at rest but pain increases with motion   Pain Location  Buttocks    Pain Orientation  Right    Pain Descriptors / Indicators  Dull;Shooting    Pain Type  Chronic pain    Pain Onset  More than a month ago    Pain Frequency  Constant    Aggravating Factors   activity    Pain Relieving Factors  better with sitting than activity but still pain with sitting    Effect of Pain on Daily Activities  limits activity tolerance         [facility] PT Assessment - 05/16/19 0001      AROM   Overall AROM Comments  No increased symptoms with trunk  flexion, increased right thigh pain with trunk extension                   [facility] Adult PT Treatment/Exercise - 05/16/19 0001      Lumbar Exercises: Stretches   Single Knee to Chest Stretch  Right;Left;5 reps    Single Knee to Chest Stretch Limitations  5-10 second holds    Double Knee to Chest Stretch Limitations  x 10 reps    Other Lumbar Stretch Exercise  seated trunk flexion stretch with P-ball roll out x 20 reps       Trigger Point Dry Needling - 05/16/19 0001    Muscles Treated Back/Hip  Gluteus maximus;Piriformis    Dry Needling Comments  needling in left sidelying with 32 gauge 50 mm needles    Electrical Stimulation Performed with Dry Needling  Yes    E-stim with Dry Needling Details  TENS 20 pps x 10 minutes           PT Education - 05/16/19 1010    Education Details  HEP updates and potential symptom etiology    Person(s) Educated  Patient  Methods  Explanation;Demonstration;Verbal cues;Handout    Comprehension  Returned demonstration;Verbalized understanding       PT Short Term Goals - 04/26/19 1240      PT SHORT TERM GOAL #1   Title  Pt will be able to complete HEPs with an at worst pain of 6/10    Baseline  Pt. indicated that her worst pain was an 8/10    Time  3    Period  Weeks    Status  New    Target Date  05/17/19      PT SHORT TERM GOAL #2   Title  Pt. will be able to demonstrate and verbalize proper posture    Baseline  Pt. demonstrates a rounded shoulder posture    Time  3    Period  Weeks    Status  New    Target Date  05/17/19        PT Long Term Goals - 04/26/19 1249      PT LONG TERM GOAL #1   Title  Patient will achieve a cervical rotation bilaterally of </= 60 degrees    Baseline  Patient demonstrated a Lt. cervical rotation of 42 degrees    Time  8    Period  Weeks    Status  New    Target Date  06/21/19      PT LONG TERM GOAL #2   Title  Patient will report an at worst pain 3/10    Baseline  Patients worst  pain is an 8/10    Time  8    Period  Weeks    Status  New    Target Date  06/21/19      PT LONG TERM GOAL #3   Title  Patient will report no more than 2 HA/week    Baseline  Patient reports multiple HA throughout the week    Time  8    Period  Weeks    Status  New    Target Date  06/21/19      PT LONG TERM GOAL #4   Title  Independent with long term HEP for continued care after d/c    Baseline  will progress and establish as appropriate    Time  8    Period  Weeks    Status  New    Target Date  06/21/19            Plan - 05/16/19 8921    Clinical Impression Statement  Pt. presents with exacerbation of piriformis region pain-differential diagnosis could include piriformis syndrome with flexion bias ROM with increased pain with trunk extension and standing/walking could be consistent with stenosis so included trial flexion bias ROM today. Updated HEP with fleixon bias ROM and will await further response by next session for RLE symptoms.    Personal Factors and Comorbidities  Age;Behavior Pattern;Profession    Examination-Activity Limitations  Other;Carry;Continence;Sit;Sleep;Squat;Stand    Stability/Clinical Decision Making  Evolving/Moderate complexity    Clinical Decision Making  Moderate    Rehab Potential  Fair    PT Frequency  2x / week    PT Duration  8 weeks    PT Treatment/Interventions  Moist Heat;Therapeutic activities;Therapeutic exercise;Balance training;Neuromuscular re-education;Manual techniques;Dry needling;Passive range of motion;Joint Manipulations;Iontophoresis 4mg /ml Dexamethasone;ADLs/Self Care Home Management;Cryotherapy;Electrical Stimulation;Traction;Ultrasound;Gait training;Patient/family education;Taping;Vestibular;Spinal Manipulations    PT Next Visit Plan  check response flexion exercises otherwise continue POC, dry needling prn    PT Home Exercise Plan  chin tucks, cervical rotation snag,  qped thoracic rotation, HS stretching; AHSS, piriformis  stretch, bridge & dead bug with LE ext rot., SKTC vs. seated trunk flexion stretch    Consulted and Agree with Plan of Care  Patient       Patient will benefit from skilled therapeutic intervention in order to improve the following deficits and impairments:  Decreased range of motion, Dizziness, Pain, Decreased balance, Hypomobility, Decreased strength, Decreased mobility, Increased muscle spasms, Improper body mechanics, Postural dysfunction, Impaired flexibility, Decreased activity tolerance  Visit Diagnosis: Cervicalgia  Chronic left shoulder pain  Chronic right shoulder pain  Pain in thoracic spine  Chronic bilateral low back pain without sciatica     Problem List Patient Active Problem List   Diagnosis Date Noted  . Facet arthritis of lumbar region 03/05/2019  . Chronic rhinitis 12/19/2018  . Shortness of breath 12/19/2018  . Ex-cigarette smoker 12/19/2018  . Greater trochanteric bursitis of right hip 07/10/2018  . Strain of left piriformis muscle 02/15/2018  . Degenerative lumbar spinal stenosis 10/17/2016  . Leg cramping 09/19/2016  . Morton's neuroma of left foot 05/24/2016  . Piriformis syndrome of left side 05/13/2015  . Greater trochanteric bursitis of left hip 11/25/2014  . Gluteal tendinitis of left buttock 11/25/2014  . Degenerative arthritis of hip 01/16/2014  . Acetabular labrum tear 01/16/2014    Lazarus Gowda, PT, DPT 05/16/19 10:12 AM  [facility] Outpatient Rehabilitation [facility] [address] [place], [place], [address] Phone: [phone]   Fax:  [phone]  Name: SHARMEL BALLANTINE MRN: [id number] Date of Birth: [birth year]

## 2019-05-16 NOTE — Therapy (Signed)
[place], [place], [address] Phone: [phone]   Fax:  [phone]  Physical Therapy Treatment  Patient Details  Name: Samantha Cox MRN: [id number] Date of Birth: [birth year] Referring Provider (PT): Lynne Leader, MD   Encounter Date: 05/16/2019  PT End of Session - 05/16/19 1010    Visit Number  5    Number of Visits  17    Date for PT Re-Evaluation  06/21/19    Authorization Type  30 VL    PT Start Time  0945   arrived late   PT Stop Time  1031    PT Time Calculation (min)  46 min    Activity Tolerance  Patient tolerated treatment well    Behavior During Therapy  [facility] for tasks assessed/performed       Past Medical History:  Diagnosis Date  . History of tobacco use   . Routine general medical examination at a health care facility   . Screening for lipoid disorders     Past Surgical History:  Procedure Laterality Date  . OTHER SURGICAL HISTORY     Birthmark removed from forehead 7th grade    There were no vitals filed for this visit.  Subjective Assessment - 05/16/19 0947    Subjective  Pt. reports "in agony" with flare up of right piriformis region-reports was sore after last session and feels like tx. increased pain. Note for addendum: had initially planned to end session at 10:15 with abbreviated session due to late arrival but due to cancellation was able to extend session to normal time.    Pertinent History  X-rays positive for lumbar destro-scoliosis and cervical DDD    Currently in Pain?  Yes    Pain Score  3    at rest but pain increases with motion   Pain Location  Buttocks    Pain Orientation  Right    Pain Descriptors / Indicators  Dull;Shooting    Pain Type  Chronic pain    Pain Onset  More than a month ago    Pain Frequency  Constant    Aggravating Factors   activity    Pain Relieving Factors  better with sitting than activity but still pain with sitting    Effect of  Pain on Daily Activities  limits activity tolerance         [facility] PT Assessment - 05/16/19 0001      AROM   Overall AROM Comments  No increased symptoms with trunk flexion, increased right thigh pain with trunk extension, LLD noted with LLE<RLE, no change in leg length with longsitting test                   [facility] Adult PT Treatment/Exercise - 05/16/19 0001      Lumbar Exercises: Stretches   Passive Hamstring Stretch  Right;Left;3 reps;30 seconds    Single Knee to Chest Stretch  Right;Left;5 reps    Single Knee to Chest Stretch Limitations  5-10 second holds    Double Knee to Chest Stretch Limitations  x 10 reps    Piriformis Stretch  Right;Left;3 reps;30 seconds    Figure 4 Stretch  3 reps;30 seconds    Figure 4 Stretch Limitations  bilaterally    Other Lumbar Stretch Exercise  seated trunk flexion stretch with P-ball roll out x 20 reps      Lumbar Exercises: Supine   Pelvic Tilt  15 reps    Bent Knee Raise  20 reps       Trigger Point Dry Needling - 05/16/19 0001    Muscles Treated Back/Hip  Gluteus maximus;Piriformis    Dry Needling Comments  needling in left sidelying with 32 gauge 50 mm needles    Electrical Stimulation Performed with Dry Needling  Yes    E-stim with Dry Needling Details  TENS 20 pps x 10 minutes           PT Education - 05/16/19 1010    Education Details  HEP updates and potential symptom etiology, LLD/heel lift for LLE    Person(s) Educated  Patient    Methods  Explanation;Demonstration;Verbal cues;Handout    Comprehension  Returned demonstration;Verbalized understanding       PT Short Term Goals - 04/26/19 1240      PT SHORT TERM GOAL #1   Title  Pt will be able to complete HEPs with an at worst pain of 6/10    Baseline  Pt. indicated that her worst pain was an 8/10    Time  3    Period  Weeks    Status  New    Target Date  05/17/19      PT SHORT TERM GOAL #2   Title  Pt. will be able to demonstrate and verbalize proper  posture    Baseline  Pt. demonstrates a rounded shoulder posture    Time  3    Period  Weeks    Status  New    Target Date  05/17/19        PT Long Term Goals - 04/26/19 1249      PT LONG TERM GOAL #1   Title  Patient will achieve a cervical rotation bilaterally of </= 60 degrees    Baseline  Patient demonstrated a Lt. cervical rotation of 42 degrees    Time  8    Period  Weeks    Status  New    Target Date  06/21/19      PT LONG TERM GOAL #2   Title  Patient will report an at worst pain 3/10    Baseline  Patients worst pain is an 8/10    Time  8    Period  Weeks    Status  New    Target Date  06/21/19      PT LONG TERM GOAL #3   Title  Patient will report no more than 2 HA/week    Baseline  Patient reports multiple HA throughout the week    Time  8    Period  Weeks    Status  New    Target Date  06/21/19      PT LONG TERM GOAL #4   Title  Independent with long term HEP for continued care after d/c    Baseline  will progress and establish as appropriate    Time  8    Period  Weeks    Status  New    Target Date  06/21/19            Plan - 05/16/19 9811    Clinical Impression Statement  Pt. presents with exacerbation of piriformis region pain-differential diagnosis could include piriformis syndrome with flexion bias ROM with increased pain with trunk extension and standing/walking could be consistent with stenosis so included trial flexion bias ROM today. Updated HEP with flexion bias ROM and will await further response by next session for RLE symptoms. Also issued heel lift for LLE to help  address LLD as suspect this could be contributing to tendency glut and piriformis tension.    Personal Factors and Comorbidities  Age;Behavior Pattern;Profession    Examination-Activity Limitations  Other;Carry;Continence;Sit;Sleep;Squat;Stand    Stability/Clinical Decision Making  Evolving/Moderate complexity    Clinical Decision Making  Moderate    Rehab Potential  Fair     PT Frequency  2x / week    PT Duration  8 weeks    PT Treatment/Interventions  Moist Heat;Therapeutic activities;Therapeutic exercise;Balance training;Neuromuscular re-education;Manual techniques;Dry needling;Passive range of motion;Joint Manipulations;Iontophoresis 4mg /ml Dexamethasone;ADLs/Self Care Home Management;Cryotherapy;Electrical Stimulation;Traction;Ultrasound;Gait training;Patient/family education;Taping;Vestibular;Spinal Manipulations    PT Next Visit Plan  check response flexion exercises otherwise continue POC, dry needling prn    PT Home Exercise Plan  chin tucks, cervical rotation snag, qped thoracic rotation, HS stretching; AHSS, piriformis stretch, bridge & dead bug with LE ext rot., SKTC vs. seated trunk flexion stretch    Consulted and Agree with Plan of Care  Patient       Patient will benefit from skilled therapeutic intervention in order to improve the following deficits and impairments:  Decreased range of motion, Dizziness, Pain, Decreased balance, Hypomobility, Decreased strength, Decreased mobility, Increased muscle spasms, Improper body mechanics, Postural dysfunction, Impaired flexibility, Decreased activity tolerance  Visit Diagnosis: Cervicalgia  Chronic left shoulder pain  Chronic right shoulder pain  Pain in thoracic spine  Chronic bilateral low back pain without sciatica     Problem List Patient Active Problem List   Diagnosis Date Noted  . Facet arthritis of lumbar region 03/05/2019  . Chronic rhinitis 12/19/2018  . Shortness of breath 12/19/2018  . Ex-cigarette smoker 12/19/2018  . Greater trochanteric bursitis of right hip 07/10/2018  . Strain of left piriformis muscle 02/15/2018  . Degenerative lumbar spinal stenosis 10/17/2016  . Leg cramping 09/19/2016  . Morton's neuroma of left foot 05/24/2016  . Piriformis syndrome of left side 05/13/2015  . Greater trochanteric bursitis of left hip 11/25/2014  . Gluteal tendinitis of left buttock  11/25/2014  . Degenerative arthritis of hip 01/16/2014  . Acetabular labrum tear 01/16/2014    14/11/2013, PT, DPT 05/16/19 10:42 AM  [facility] [address] [place], [place], [place] Phone: [phone]   Fax:  [phone]  Name: Samantha Cox MRN: Bernadette Hoit Date of Birth: 1955/06/03

## 2019-05-20 ENCOUNTER — Ambulatory Visit: Payer: BC Managed Care – PPO | Admitting: Physical Therapy

## 2019-05-20 ENCOUNTER — Other Ambulatory Visit: Payer: Self-pay

## 2019-05-20 ENCOUNTER — Encounter: Payer: Self-pay | Admitting: Physical Therapy

## 2019-05-20 DIAGNOSIS — M25511 Pain in right shoulder: Secondary | ICD-10-CM | POA: Diagnosis not present

## 2019-05-20 DIAGNOSIS — M546 Pain in thoracic spine: Secondary | ICD-10-CM | POA: Diagnosis not present

## 2019-05-20 DIAGNOSIS — M542 Cervicalgia: Secondary | ICD-10-CM | POA: Diagnosis not present

## 2019-05-20 DIAGNOSIS — G8929 Other chronic pain: Secondary | ICD-10-CM | POA: Diagnosis not present

## 2019-05-20 DIAGNOSIS — M545 Low back pain: Secondary | ICD-10-CM | POA: Diagnosis not present

## 2019-05-20 DIAGNOSIS — M25512 Pain in left shoulder: Secondary | ICD-10-CM | POA: Diagnosis not present

## 2019-05-20 NOTE — Therapy (Signed)
[facility] [address] [place], [place], [address] Phone: [phone]   Fax:  [phone]  Physical Therapy Treatment  Patient Details  Name: Samantha Cox MRN: [id number] Date of Birth: 1955-11-23 Referring Provider (PT): [name], MD   Encounter Date: 05/20/2019  PT End of Session - 05/20/19 1956    Visit Number  6    Number of Visits  17    Date for PT Re-Evaluation  06/21/19    Authorization Type  30 VL    PT Start Time  1724   arrived late   PT Stop Time  1810    PT Time Calculation (min)  46 min    Activity Tolerance  Patient tolerated treatment well    Behavior During Therapy  [facility] for tasks assessed/performed       Past Medical History:  Diagnosis Date  . History of tobacco use   . Routine general medical examination at a health care facility   . Screening for lipoid disorders     Past Surgical History:  Procedure Laterality Date  . OTHER SURGICAL HISTORY     Birthmark removed from forehead 7th grade    There were no vitals filed for this visit.  Subjective Assessment - 05/20/19 1949    Subjective  Right piriformis region improved after last session though still with some residual pain. Flexion exercises seemed to help leg symptoms but still some radiating pain with walking. Primary pain complaint today is left lumbar and posterolateral hip pain. For heel lift issued last session pt. reports felt more "squared"/even with leg length for gait.    Pertinent History  X-rays positive for lumbar destro-scoliosis and cervical DDD    Patient Stated Goals  Patinet states that she would like to reduce pain, especially on the left side    Currently in Pain?  Yes    Pain Score  6     Pain Location  Back    Pain Orientation  Left;Lower    Pain Descriptors / Indicators  Dull;Shooting    Pain Type  Chronic pain    Pain Radiating Towards  left posterolateral hip    Pain Onset  More than a month ago    Pain  Frequency  Constant    Aggravating Factors   activity, walking    Pain Relieving Factors  sitting is better than activity    Effect of Pain on Daily Activities  limits activity tolerance                       [facility] Adult PT Treatment/Exercise - 05/20/19 0001      Lumbar Exercises: Stretches   Passive Hamstring Stretch  Right;Left;2 reps;30 seconds    Double Knee to Chest Stretch Limitations  x 15 reps with legs on 55 cm P-ball    Piriformis Stretch  Right;Left;3 reps;30 seconds      Manual Therapy   Soft tissue mobilization  left lumbar STM, bilateral piriformis/posterolateral hip with TFL included on left side, included IASTM with foam roll       Trigger Point Dry Needling - 05/20/19 0001    Consent Given?  Yes    Muscles Treated Back/Hip  Gluteus medius;Piriformis;Tensor fascia lata;Erector spinae    Dry Needling Comments  brief needling of left TFL in right sidelying then other muscles needled in prone, 30-32 gauge 50 mm needles used    Electrical Stimulation Performed with Dry Needling  Yes  E-stim with Dry Needling Details  TENS 20 pps x 10 minutes           PT Education - 05/20/19 1955    Education Details  hip muscular anatomy, association of pelvic position from scoliosis on muscle tightness, active vs. latent trigger points    Person(s) Educated  Patient    Methods  Explanation    Comprehension  Verbalized understanding       PT Short Term Goals - 04/26/19 1240      PT SHORT TERM GOAL #1   Title  Pt will be able to complete HEPs with an at worst pain of 6/10    Baseline  Pt. indicated that her worst pain was an 8/10    Time  3    Period  Weeks    Status  New    Target Date  05/17/19      PT SHORT TERM GOAL #2   Title  Pt. will be able to demonstrate and verbalize proper posture    Baseline  Pt. demonstrates a rounded shoulder posture    Time  3    Period  Weeks    Status  New    Target Date  05/17/19        PT Long Term Goals -  04/26/19 1249      PT LONG TERM GOAL #1   Title  Patient will achieve a cervical rotation bilaterally of </= 60 degrees    Baseline  Patient demonstrated a Lt. cervical rotation of 42 degrees    Time  8    Period  Weeks    Status  New    Target Date  06/21/19      PT LONG TERM GOAL #2   Title  Patient will report an at worst pain 3/10    Baseline  Patients worst pain is an 8/10    Time  8    Period  Weeks    Status  New    Target Date  06/21/19      PT LONG TERM GOAL #3   Title  Patient will report no more than 2 HA/week    Baseline  Patient reports multiple HA throughout the week    Time  8    Period  Weeks    Status  New    Target Date  06/21/19      PT LONG TERM GOAL #4   Title  Independent with long term HEP for continued care after d/c    Baseline  will progress and establish as appropriate    Time  8    Period  Weeks    Status  New    Target Date  06/21/19            Plan - 05/20/19 1957    Clinical Impression Statement  Improved status for right hip symptoms after last visit but setback with increased left side pain today so included more focus on this side today with manual and exercises. Given underlying issues with scoliosis and mulitple tx. areas expect progress for more consistent relief will be gradual.    Personal Factors and Comorbidities  Age;Behavior Pattern;Profession    Examination-Activity Limitations  Other;Carry;Continence;Sit;Sleep;Squat;Stand    Examination-Participation Restrictions  Other    Stability/Clinical Decision Making  Evolving/Moderate complexity    Clinical Decision Making  Moderate    Rehab Potential  Fair    PT Frequency  2x / week    PT Duration  8 weeks  PT Treatment/Interventions  Moist Heat;Therapeutic activities;Therapeutic exercise;Balance training;Neuromuscular re-education;Manual techniques;Dry needling;Passive range of motion;Joint Manipulations;Iontophoresis 4mg /ml Dexamethasone;ADLs/Self Care Home  Management;Cryotherapy;Electrical Stimulation;Traction;Ultrasound;Gait training;Patient/family education;Taping;Vestibular;Spinal Manipulations    PT Next Visit Plan  pending symptomis continue focus left vs. right lumbar and hip region, dry needling, hip stretches and manual, try hip LAD, flexion bias lumbar ROM and core strengthening    PT Home Exercise Plan  chin tucks, cervical rotation snag, qped thoracic rotation, HS stretching; AHSS, piriformis stretch, bridge & dead bug with LE ext rot., SKTC vs. seated trunk flexion stretch    Consulted and Agree with Plan of Care  Patient       Patient will benefit from skilled therapeutic intervention in order to improve the following deficits and impairments:  Decreased range of motion, Dizziness, Pain, Decreased balance, Hypomobility, Decreased strength, Decreased mobility, Increased muscle spasms, Improper body mechanics, Postural dysfunction, Impaired flexibility, Decreased activity tolerance  Visit Diagnosis: Cervicalgia  Chronic left shoulder pain  Chronic right shoulder pain  Pain in thoracic spine  Chronic bilateral low back pain without sciatica     Problem List Patient Active Problem List   Diagnosis Date Noted  . Facet arthritis of lumbar region 03/05/2019  . Chronic rhinitis 12/19/2018  . Shortness of breath 12/19/2018  . Ex-cigarette smoker 12/19/2018  . Greater trochanteric bursitis of right hip 07/10/2018  . Strain of left piriformis muscle 02/15/2018  . Degenerative lumbar spinal stenosis 10/17/2016  . Leg cramping 09/19/2016  . Morton's neuroma of left foot 05/24/2016  . Piriformis syndrome of left side 05/13/2015  . Greater trochanteric bursitis of left hip 11/25/2014  . Gluteal tendinitis of left buttock 11/25/2014  . Degenerative arthritis of hip 01/16/2014  . Acetabular labrum tear 01/16/2014    Beaulah Dinning, PT, DPT 05/20/19 8:02 PM  [facility] [address] [place], [place], [address] Phone: [phone]   Fax:  [phone]  Name: CAMREIGH MICHIE MRN: [id number] Date of Birth: Apr 19, 1955

## 2019-05-23 ENCOUNTER — Ambulatory Visit: Payer: BC Managed Care – PPO | Admitting: Physical Therapy

## 2019-05-23 ENCOUNTER — Other Ambulatory Visit: Payer: Self-pay

## 2019-05-23 ENCOUNTER — Encounter: Payer: Self-pay | Admitting: Physical Therapy

## 2019-05-23 DIAGNOSIS — M545 Low back pain, unspecified: Secondary | ICD-10-CM

## 2019-05-23 DIAGNOSIS — G8929 Other chronic pain: Secondary | ICD-10-CM

## 2019-05-23 DIAGNOSIS — M25511 Pain in right shoulder: Secondary | ICD-10-CM | POA: Diagnosis not present

## 2019-05-23 DIAGNOSIS — M546 Pain in thoracic spine: Secondary | ICD-10-CM

## 2019-05-23 DIAGNOSIS — M25512 Pain in left shoulder: Secondary | ICD-10-CM | POA: Diagnosis not present

## 2019-05-23 DIAGNOSIS — M542 Cervicalgia: Secondary | ICD-10-CM

## 2019-05-23 NOTE — Therapy (Signed)
[facility] [address] [place], [place], [address] Phone: [phone]   Fax:  [phone]  Physical Therapy Treatment  Patient Details  Name: Samantha Cox MRN: [id number] Date of Birth: [birth year] Referring Provider (PT): [name], MD   Encounter Date: 05/23/2019  PT End of Session - 05/23/19 1735    Visit Number  7    Number of Visits  17    Date for PT Re-Evaluation  06/21/19    Authorization Type  30 VL    PT Start Time  1728   pt. arrived late   PT Stop Time  1811    PT Time Calculation (min)  43 min    Activity Tolerance  Patient tolerated treatment well    Behavior During Therapy  [facility] for tasks assessed/performed       Past Medical History:  Diagnosis Date  . History of tobacco use   . Routine general medical examination at a health care facility   . Screening for lipoid disorders     Past Surgical History:  Procedure Laterality Date  . OTHER SURGICAL HISTORY     Birthmark removed from forehead 7th grade    There were no vitals filed for this visit.  Subjective Assessment - 05/23/19 1810    Subjective  Pt. reports last session was helpful for glut/hip pain symptoms (left side most symptomatic) but reports tendency symptom exacerbation with riding horses. Primary symptoms still in left glut region.    Pertinent History  X-rays positive for lumbar destro-scoliosis and cervical DDD    Patient Stated Goals  Patinet states that she would like to reduce pain, especially on the left side                       [facility] Adult PT Treatment/Exercise - 05/23/19 0001      Lumbar Exercises: Stretches   Figure 4 Stretch  3 reps;30 seconds    Figure 4 Stretch Limitations  left side    Other Lumbar Stretch Exercise  sidelying manual left QL stretch 3x30 sec   also HEP instruction/practice standing variation     Lumbar Exercises: Standing   Row  AROM;Strengthening;Both;20 reps    Theraband Level  (Row)  Level 4 (Blue)    Shoulder Extension  AROM;Strengthening;20 reps    Theraband Level (Shoulder Extension)  Level 4 (Blue)    Other Standing Lumbar Exercises  Pall off press x 15 ea. way with blue band      Lumbar Exercises: Supine   Pelvic Tilt  15 reps    Clam  20 reps    Clam Limitations  blue band    Dead Bug  20 reps    Bridge with clamshell  20 reps    Bridge with [employer] Limitations  Blue Theraband    Isometric Hip Flexion  15 reps;3 seconds    Isometric Hip Flexion Limitations  alternating bilat.       Trigger Point Dry Needling - 05/23/19 0001    Consent Given?  Yes    Muscles Treated Back/Hip  Gluteus maximus;Piriformis;Quadratus lumborum   left side   Dry Needling Comments  needling in right sidelying with 32 gauge 50 mm needles, posterior approach used for QL    Electrical Stimulation Performed with Dry Needling  Yes    E-stim with Dry Needling Details  TENS 20 pps x 10 minutes           PT Education -  05/23/19 1937    Education Details  HEP updates, exercises, role of lumbopelvic and core stabilization/strengthening in assisting symptom relief    Person(s) Educated  Patient    Methods  Explanation;Demonstration;Verbal cues;Handout    Comprehension  Verbalized understanding;Returned demonstration       PT Short Term Goals - 04/26/19 1240      PT SHORT TERM GOAL #1   Title  Pt will be able to complete HEPs with an at worst pain of 6/10    Baseline  Pt. indicated that her worst pain was an 8/10    Time  3    Period  Weeks    Status  New    Target Date  05/17/19      PT SHORT TERM GOAL #2   Title  Pt. will be able to demonstrate and verbalize proper posture    Baseline  Pt. demonstrates a rounded shoulder posture    Time  3    Period  Weeks    Status  New    Target Date  05/17/19        PT Long Term Goals - 04/26/19 1249      PT LONG TERM GOAL #1   Title  Patient will achieve a cervical rotation bilaterally of </= 60 degrees     Baseline  Patient demonstrated a Lt. cervical rotation of 42 degrees    Time  8    Period  Weeks    Status  New    Target Date  06/21/19      PT LONG TERM GOAL #2   Title  Patient will report an at worst pain 3/10    Baseline  Patients worst pain is an 8/10    Time  8    Period  Weeks    Status  New    Target Date  06/21/19      PT LONG TERM GOAL #3   Title  Patient will report no more than 2 HA/week    Baseline  Patient reports multiple HA throughout the week    Time  8    Period  Weeks    Status  New    Target Date  06/21/19      PT LONG TERM GOAL #4   Title  Independent with long term HEP for continued care after d/c    Baseline  will progress and establish as appropriate    Time  8    Period  Weeks    Status  New    Target Date  06/21/19            Plan - 05/23/19 1938    Clinical Impression Statement  More work on exercises today for flexion bias core and lumbopelvic stabilization exercises to improve hip/pelvic stability with riding horses. Responds well to tx. but tendency symptom exacerbation with activity between visits with contributing factors from underlying scoliosis and lumbar DDD with hip and core weakness. Expect more consistent relief will be gradual due to time needed for work on muscle activation and strengthening.    Personal Factors and Comorbidities  Age;Behavior Pattern;Profession    Examination-Activity Limitations  Other;Carry;Continence;Sit;Sleep;Squat;Stand    Examination-Participation Restrictions  Other    Stability/Clinical Decision Making  Evolving/Moderate complexity    Clinical Decision Making  Moderate    Rehab Potential  Fair    PT Frequency  2x / week    PT Duration  8 weeks    PT Treatment/Interventions  Moist Heat;Therapeutic activities;Therapeutic exercise;Balance  training;Neuromuscular re-education;Manual techniques;Dry needling;Passive range of motion;Joint Manipulations;Iontophoresis 4mg /ml Dexamethasone;ADLs/Self Care Home  Management;Cryotherapy;Electrical Stimulation;Traction;Ultrasound;Gait training;Patient/family education;Taping;Vestibular;Spinal Manipulations    PT Next Visit Plan  continue left glut/hip focus pending region of greatest symptoms, progress exercises as tolerated, potential consideration seated P-ball core strengthening to simulate seated position riding horses, further manual and dry needling prn    PT Home Exercise Plan  chin tucks, cervical rotation snag, qped thoracic rotation, HS stretching; AHSS, piriformis stretch, QL stretch (left),  pelvic tilt, bridge & dead bug with LE ext rot., SKTC vs. seated trunk flexion stretch, pall off press, Theraband row and ext, front squat with KB vs. DB    Consulted and Agree with Plan of Care  Patient       Patient will benefit from skilled therapeutic intervention in order to improve the following deficits and impairments:  Decreased range of motion, Dizziness, Pain, Decreased balance, Hypomobility, Decreased strength, Decreased mobility, Increased muscle spasms, Improper body mechanics, Postural dysfunction, Impaired flexibility, Decreased activity tolerance  Visit Diagnosis: Cervicalgia  Chronic left shoulder pain  Chronic right shoulder pain  Pain in thoracic spine  Chronic bilateral low back pain without sciatica     Problem List Patient Active Problem List   Diagnosis Date Noted  . Facet arthritis of lumbar region 03/05/2019  . Chronic rhinitis 12/19/2018  . Shortness of breath 12/19/2018  . Ex-cigarette smoker 12/19/2018  . Greater trochanteric bursitis of right hip 07/10/2018  . Strain of left piriformis muscle 02/15/2018  . Degenerative lumbar spinal stenosis 10/17/2016  . Leg cramping 09/19/2016  . Morton's neuroma of left foot 05/24/2016  . Piriformis syndrome of left side 05/13/2015  . Greater trochanteric bursitis of left hip 11/25/2014  . Gluteal tendinitis of left buttock 11/25/2014  . Degenerative arthritis of hip  01/16/2014  . Acetabular labrum tear 01/16/2014    Beaulah Dinning, PT, DPT 05/23/19 7:46 PM  [facility] [address] [place], [place], [address] Phone: [phone]   Fax:  [phone]  Name: Samantha Cox MRN: [id number] Date of Birth: November 18, 1955

## 2019-05-28 ENCOUNTER — Ambulatory Visit: Payer: BLUE CROSS/BLUE SHIELD | Admitting: Family Medicine

## 2019-05-29 ENCOUNTER — Other Ambulatory Visit: Payer: Self-pay

## 2019-05-29 ENCOUNTER — Encounter: Payer: Self-pay | Admitting: Physical Therapy

## 2019-05-29 ENCOUNTER — Ambulatory Visit: Payer: BC Managed Care – PPO | Admitting: Physical Therapy

## 2019-05-29 DIAGNOSIS — M542 Cervicalgia: Secondary | ICD-10-CM

## 2019-05-29 DIAGNOSIS — G8929 Other chronic pain: Secondary | ICD-10-CM | POA: Diagnosis not present

## 2019-05-29 DIAGNOSIS — M25512 Pain in left shoulder: Secondary | ICD-10-CM | POA: Diagnosis not present

## 2019-05-29 DIAGNOSIS — M545 Low back pain, unspecified: Secondary | ICD-10-CM

## 2019-05-29 DIAGNOSIS — M546 Pain in thoracic spine: Secondary | ICD-10-CM

## 2019-05-29 DIAGNOSIS — M25511 Pain in right shoulder: Secondary | ICD-10-CM | POA: Diagnosis not present

## 2019-05-29 NOTE — Therapy (Signed)
[facility] [address] [place], [place], [address] Phone: [phone]   Fax:  [phone]  Physical Therapy Treatment  Patient Details  Name: Samantha Cox MRN: [id number] Date of Birth: August 05, 1955 Referring Provider (PT): [name], MD   Encounter Date: 05/29/2019  PT End of Session - 05/29/19 1716    Visit Number  8    Number of Visits  17    Date for PT Re-Evaluation  06/21/19    Authorization Type  30 VL    PT Start Time  1640   pt arrived late   PT Stop Time  1716    PT Time Calculation (min)  36 min    Activity Tolerance  Patient tolerated treatment well    Behavior During Therapy  [facility] for tasks assessed/performed       Past Medical History:  Diagnosis Date  . History of tobacco use   . Routine general medical examination at a health care facility   . Screening for lipoid disorders     Past Surgical History:  Procedure Laterality Date  . OTHER SURGICAL HISTORY     Birthmark removed from forehead 7th grade    There were no vitals filed for this visit.  Subjective Assessment - 05/29/19 1720    Subjective  Feels that she benefited more from use of TENS with DN for hip rather than pistoning. Neck has been hurting more since she was on the computer for so long.    Patient Stated Goals  Patinet states that she would like to reduce pain, especially on the left side                       [facility] Adult PT Treatment/Exercise - 05/29/19 0001      Lumbar Exercises: Seated   Other Seated Lumbar Exercises  postural alignment & core engagement/scapular retraction      Lumbar Exercises: Quadruped   Other Quadruped Lumbar Exercises  bird dog- cues for core & length      Manual Therapy   Manual Therapy  Joint mobilization    Manual therapy comments  skilled palpation and monitoring during TPDN    Joint Mobilization  gross rib mobs on Rt side    Soft tissue mobilization  bil upper traps        Trigger Point Dry Needling - 05/29/19 0001    Upper Trapezius Response  Twitch reponse elicited;Palpable increased muscle length   bilateral          PT Education - 05/29/19 1730    Education Details  see plan    Person(s) Educated  Patient    Methods  Explanation    Comprehension  Verbalized understanding;Need further instruction       PT Short Term Goals - 05/29/19 1737      PT SHORT TERM GOAL #1   Title  Pt will be able to complete HEPs with an at worst pain of 6/10    Status  Achieved      PT SHORT TERM GOAL #2   Title  Pt. will be able to demonstrate and verbalize proper posture    Baseline  requires further instruction    Status  On-going        PT Long Term Goals - 04/26/19 1249      PT LONG TERM GOAL #1   Title  Patient will achieve a cervical rotation bilaterally of </= 60 degrees    Baseline  Patient demonstrated a Lt. cervical rotation of 42 degrees    Time  8    Period  Weeks    Status  New    Target Date  06/21/19      PT LONG TERM GOAL #2   Title  Patient will report an at worst pain 3/10    Baseline  Patients worst pain is an 8/10    Time  8    Period  Weeks    Status  New    Target Date  06/21/19      PT LONG TERM GOAL #3   Title  Patient will report no more than 2 HA/week    Baseline  Patient reports multiple HA throughout the week    Time  8    Period  Weeks    Status  New    Target Date  06/21/19      PT LONG TERM GOAL #4   Title  Independent with long term HEP for continued care after d/c    Baseline  will progress and establish as appropriate    Time  8    Period  Weeks    Status  New    Target Date  06/21/19            Plan - 05/29/19 1726    Clinical Impression Statement  Pt prefers for TENS to be used with DN rather than pistoning in hip so I will watch schedules to swap her to [employer]. Today we focused on upper traps and improving overall resting posture. Signficiant education today on progressions of  exercises & return of pains/tension due to postures required on horses. Pt verbalized understanding.    PT Treatment/Interventions  Moist Heat;Therapeutic activities;Therapeutic exercise;Balance training;Neuromuscular re-education;Manual techniques;Dry needling;Passive range of motion;Joint Manipulations;Iontophoresis 4mg /ml Dexamethasone;ADLs/Self Care Home Management;Cryotherapy;Electrical Stimulation;Traction;Ultrasound;Gait training;Patient/family education;Taping;Vestibular;Spinal Manipulations    PT Next Visit Plan  continue left glut/hip focus pending region of greatest symptoms, progress exercises as tolerated, potential consideration seated P-ball core strengthening to simulate seated position riding horses, further manual and dry needling prn- TENS for hip    PT Home Exercise Plan  chin tucks, cervical rotation snag, qped thoracic rotation, HS stretching; AHSS, piriformis stretch, QL stretch (left),  pelvic tilt, bridge & dead bug with LE ext rot., SKTC vs. seated trunk flexion stretch, pall off press, Theraband row and ext, front squat with KB vs. DB    Consulted and Agree with Plan of Care  Patient       Patient will benefit from skilled therapeutic intervention in order to improve the following deficits and impairments:  Decreased range of motion, Dizziness, Pain, Decreased balance, Hypomobility, Decreased strength, Decreased mobility, Increased muscle spasms, Improper body mechanics, Postural dysfunction, Impaired flexibility, Decreased activity tolerance  Visit Diagnosis: Cervicalgia  Chronic left shoulder pain  Chronic right shoulder pain  Pain in thoracic spine  Chronic bilateral low back pain without sciatica     Problem List Patient Active Problem List   Diagnosis Date Noted  . Facet arthritis of lumbar region 03/05/2019  . Chronic rhinitis 12/19/2018  . Shortness of breath 12/19/2018  . Ex-cigarette smoker 12/19/2018  . Greater trochanteric bursitis of right hip  07/10/2018  . Strain of left piriformis muscle 02/15/2018  . Degenerative lumbar spinal stenosis 10/17/2016  . Leg cramping 09/19/2016  . Morton's neuroma of left foot 05/24/2016  . Piriformis syndrome of left side 05/13/2015  . Greater trochanteric bursitis of left hip 11/25/2014  . Gluteal tendinitis of  left buttock 11/25/2014  . Degenerative arthritis of hip 01/16/2014  . Acetabular labrum tear 01/16/2014    Momina Hunton C. Angeleena Dueitt PT, DPT 05/29/19 5:38 PM   [facility] [address] [place], [place], [address] Phone: [phone]   Fax:  [phone]  Name: Samantha Cox MRN: [id number] Date of Birth: 09/06/1955

## 2019-05-31 ENCOUNTER — Other Ambulatory Visit: Payer: Self-pay

## 2019-05-31 ENCOUNTER — Encounter: Payer: BLUE CROSS/BLUE SHIELD | Admitting: Physical Therapy

## 2019-05-31 ENCOUNTER — Encounter: Payer: Self-pay | Admitting: Physical Therapy

## 2019-05-31 ENCOUNTER — Ambulatory Visit: Payer: BC Managed Care – PPO | Admitting: Physical Therapy

## 2019-05-31 DIAGNOSIS — M546 Pain in thoracic spine: Secondary | ICD-10-CM | POA: Diagnosis not present

## 2019-05-31 DIAGNOSIS — M542 Cervicalgia: Secondary | ICD-10-CM

## 2019-05-31 DIAGNOSIS — M545 Low back pain, unspecified: Secondary | ICD-10-CM

## 2019-05-31 DIAGNOSIS — M25511 Pain in right shoulder: Secondary | ICD-10-CM

## 2019-05-31 DIAGNOSIS — M25512 Pain in left shoulder: Secondary | ICD-10-CM

## 2019-05-31 DIAGNOSIS — G8929 Other chronic pain: Secondary | ICD-10-CM | POA: Diagnosis not present

## 2019-05-31 NOTE — Therapy (Signed)
[place], [place], [address] Phone: [phone]   Fax:  [phone]  Physical Therapy Treatment  Patient Details  Name: Samantha Cox MRN: [id number] Date of Birth: March 29, 1955 Referring Provider (PT): Lynne Leader, MD   Encounter Date: 05/31/2019  PT End of Session - 05/31/19 1429    Visit Number  9    Number of Visits  17    Date for PT Re-Evaluation  06/21/19    PT Start Time  6962    PT Stop Time  1100    PT Time Calculation (min)  45 min    Activity Tolerance  Patient tolerated treatment well    Behavior During Therapy  [facility] for tasks assessed/performed       Past Medical History:  Diagnosis Date  . History of tobacco use   . Routine general medical examination at a health care facility   . Screening for lipoid disorders     Past Surgical History:  Procedure Laterality Date  . OTHER SURGICAL HISTORY     Birthmark removed from forehead 7th grade    There were no vitals filed for this visit.  Subjective Assessment - 05/31/19 1424    Subjective  Primary concern today is left>right hip and lumbar pain exacerbated with sitting position.    Pertinent History  X-rays positive for lumbar destro-scoliosis and cervical DDD    Currently in Pain?  Yes    Pain Score  6     Pain Location  Back    Pain Orientation  Left;Right;Lower    Pain Descriptors / Indicators  Dull;Shooting    Pain Type  Chronic pain    Pain Radiating Towards  bilateral piriformis region left>right    Pain Onset  More than a month ago    Pain Frequency  Constant    Aggravating Factors   sitting, walking, activity                       [facility] Adult PT Treatment/Exercise - 05/31/19 0001      Lumbar Exercises: Stretches   Passive Hamstring Stretch  Right;Left;2 reps;30 seconds    Piriformis Stretch  Right;Left;3 reps;20 seconds      Lumbar Exercises: Supine   Dead Bug  20 reps    Bridge with clamshell  20  reps   blue band     Manual Therapy   Soft tissue mobilization  IASTM/STM bilateral piriformis region, piriformis release with passive hip ER       Trigger Point Dry Needling - 05/31/19 0001    Consent Given?  Yes    Muscles Treated Back/Hip  Gluteus maximus;Piriformis;Erector spinae    Dry Needling Comments  Needling to left lumbar longissimus at L4-5 region and bilat. glut/piriformis region with 30 gaueg 50 mm needles for back and 30 gauge 75 mm needles for hips    Electrical Stimulation Performed with Dry Needling  Yes    E-stim with Dry Needling Details  TENS 20 pps x 10 minutes           PT Education - 05/31/19 1429    Education Details  POC    Person(s) Educated  Patient    Methods  Explanation    Comprehension  Verbalized understanding       PT Short Term Goals - 05/29/19 1737      PT SHORT TERM GOAL #1   Title  Pt will be able to complete HEPs with  an at worst pain of 6/10    Status  Achieved      PT SHORT TERM GOAL #2   Title  Pt. will be able to demonstrate and verbalize proper posture    Baseline  requires further instruction    Status  On-going        PT Long Term Goals - 04/26/19 1249      PT LONG TERM GOAL #1   Title  Patient will achieve a cervical rotation bilaterally of </= 60 degrees    Baseline  Patient demonstrated a Lt. cervical rotation of 42 degrees    Time  8    Period  Weeks    Status  New    Target Date  06/21/19      PT LONG TERM GOAL #2   Title  Patient will report an at worst pain 3/10    Baseline  Patients worst pain is an 8/10    Time  8    Period  Weeks    Status  New    Target Date  06/21/19      PT LONG TERM GOAL #3   Title  Patient will report no more than 2 HA/week    Baseline  Patient reports multiple HA throughout the week    Time  8    Period  Weeks    Status  New    Target Date  06/21/19      PT LONG TERM GOAL #4   Title  Independent with long term HEP for continued care after d/c    Baseline  will progress  and establish as appropriate    Time  8    Period  Weeks    Status  New    Target Date  06/21/19            Plan - 05/31/19 1430    Clinical Impression Statement  Responds well to tx. but fair progress given tendency symptom exacerbation between sessions with contributing factors from underlying scoliosis and lumbar DDD and status complicated by multiple tx. areas.    Personal Factors and Comorbidities  Age;Behavior Pattern;Profession    Examination-Activity Limitations  Other;Carry;Continence;Sit;Sleep;Squat;Stand    Examination-Participation Restrictions  Other    Stability/Clinical Decision Making  Evolving/Moderate complexity    Clinical Decision Making  Moderate    Rehab Potential  Fair    PT Frequency  2x / week    PT Duration  8 weeks    PT Treatment/Interventions  Moist Heat;Therapeutic activities;Therapeutic exercise;Balance training;Neuromuscular re-education;Manual techniques;Dry needling;Passive range of motion;Joint Manipulations;Iontophoresis 4mg /ml Dexamethasone;ADLs/Self Care Home Management;Cryotherapy;Electrical Stimulation;Traction;Ultrasound;Gait training;Patient/family education;Taping;Vestibular;Spinal Manipulations    PT Next Visit Plan  Recheck FOTO? continue left glut/hip focus pending region of greatest symptoms, progress exercises as tolerated, potential consideration seated P-ball core strengthening to simulate seated position riding horses, further manual and dry needling prn- TENS for hip    PT Home Exercise Plan  chin tucks, cervical rotation snag, qped thoracic rotation, HS stretching; AHSS, piriformis stretch, QL stretch (left),  pelvic tilt, bridge & dead bug with LE ext rot., SKTC vs. seated trunk flexion stretch, pall off press, Theraband row and ext, front squat with KB vs. DB    Consulted and Agree with Plan of Care  Patient       Patient will benefit from skilled therapeutic intervention in order to improve the following deficits and impairments:   Decreased range of motion, Dizziness, Pain, Decreased balance, Hypomobility, Decreased strength, Decreased mobility, Increased muscle spasms, Improper  body mechanics, Postural dysfunction, Impaired flexibility, Decreased activity tolerance  Visit Diagnosis: Cervicalgia  Chronic left shoulder pain  Chronic right shoulder pain  Pain in thoracic spine  Chronic bilateral low back pain without sciatica     Problem List Patient Active Problem List   Diagnosis Date Noted  . Facet arthritis of lumbar region 03/05/2019  . Chronic rhinitis 12/19/2018  . Shortness of breath 12/19/2018  . Ex-cigarette smoker 12/19/2018  . Greater trochanteric bursitis of right hip 07/10/2018  . Strain of left piriformis muscle 02/15/2018  . Degenerative lumbar spinal stenosis 10/17/2016  . Leg cramping 09/19/2016  . Morton's neuroma of left foot 05/24/2016  . Piriformis syndrome of left side 05/13/2015  . Greater trochanteric bursitis of left hip 11/25/2014  . Gluteal tendinitis of left buttock 11/25/2014  . Degenerative arthritis of hip 01/16/2014  . Acetabular labrum tear 01/16/2014    Lazarus Gowda, PT, DPT 05/31/19 2:32 PM  [facility] [address] [place], [place], [address] Phone: [phone]   Fax:  [phone]  Name: Samantha Cox MRN: [id number] Date of Birth: Mar 20, 1955

## 2019-06-03 ENCOUNTER — Encounter: Payer: BLUE CROSS/BLUE SHIELD | Admitting: Physical Therapy

## 2019-06-05 ENCOUNTER — Encounter: Payer: Self-pay | Admitting: Family Medicine

## 2019-06-05 ENCOUNTER — Ambulatory Visit (INDEPENDENT_AMBULATORY_CARE_PROVIDER_SITE_OTHER): Payer: BC Managed Care – PPO | Admitting: Family Medicine

## 2019-06-05 ENCOUNTER — Other Ambulatory Visit: Payer: Self-pay

## 2019-06-05 ENCOUNTER — Ambulatory Visit: Payer: Self-pay

## 2019-06-05 VITALS — BP 128/88 | HR 85 | Ht 66.0 in | Wt 173.6 lb

## 2019-06-05 DIAGNOSIS — M7602 Gluteal tendinitis, left hip: Secondary | ICD-10-CM | POA: Diagnosis not present

## 2019-06-05 DIAGNOSIS — M7062 Trochanteric bursitis, left hip: Secondary | ICD-10-CM

## 2019-06-05 DIAGNOSIS — M533 Sacrococcygeal disorders, not elsewhere classified: Secondary | ICD-10-CM | POA: Diagnosis not present

## 2019-06-05 DIAGNOSIS — M7061 Trochanteric bursitis, right hip: Secondary | ICD-10-CM

## 2019-06-05 DIAGNOSIS — M25551 Pain in right hip: Secondary | ICD-10-CM | POA: Diagnosis not present

## 2019-06-05 DIAGNOSIS — G8929 Other chronic pain: Secondary | ICD-10-CM

## 2019-06-05 DIAGNOSIS — M25552 Pain in left hip: Secondary | ICD-10-CM | POA: Diagnosis not present

## 2019-06-05 NOTE — Patient Instructions (Addendum)
Thank you for coming in today. Let me know if not better.  Could plan for MRI.  Keep me update.d  Call or go to the ER if you develop a large red swollen joint with extreme pain or oozing puss.   Back off on the dry needling in PT especially if making your pain worse in the longer run.

## 2019-06-05 NOTE — Progress Notes (Addendum)
I, Wendy Poet, LAT, ATC, am serving as scribe for Dr. Lynne Leader.  Samantha Cox is a 64 y.o. female who presents to [place] at [facility] today for f/u of neck, mid-back and low back pain.  She was last seen by Dr. Georgina Snell on 04/12/19 and was referred to outpatient PT of which she has completed 9 visits.  She has had multiple lumbar epidurals and most recently had B L4-5 facet injections on 03/14/19.  She has also been referred to the [facility] pain medicine clinic.  Since her last visit, pt reports that she con't to have pain in her L glute and her R lateral hip.  She states that she feels like the PT is making her pain worse.  She has not been scheduled w/ [facility].  She notes that she is feeling a bit better and does not think that this point that she needs a bunch of back injections that we originally planned. (Addendum) Pt contacted me and notes that she is willing to proceed to injections if needed.    Pertinent review of systems: No fevers or chills  Relevant historical information: Avid horse rider.   Exam:  BP 128/88 (BP Location: Right Arm, Patient Position: Sitting, Cuff Size: Normal)   Pulse 85   Ht 5\' 6"  (1.676 m)   Wt 173 lb 9.6 oz (78.7 kg)   SpO2 97%   BMI 28.02 kg/m  General: Well Developed, well nourished, and in no acute distress.   MSK: L-spine nontender spinal midline normal lumbar motion some pain with extension. Tender palpation left SI joint. Hips bilaterally normal.  Normal hip motion.  Right hip tender SI joint nontender otherwise. Left hip tender at greater trochanter and gluteus medius/piriformis insertion.     Lab and Radiology Results  Procedure: Real-time Ultrasound Guided Injection of left SI joint Device: Philips Affiniti 50G Images permanently stored and available for review in the ultrasound unit. Verbal informed consent obtained.  Discussed risks and benefits of procedure. Warned about infection bleeding damage  to structures skin hypopigmentation and fat atrophy among others. Patient expresses understanding and agreement Time-out conducted.   Noted no overlying erythema, induration, or other signs of local infection.   Skin prepped in a sterile fashion.   Local anesthesia: Topical Ethyl chloride.   With sterile technique and under real time ultrasound guidance:  40 mg of Kenalog and 2 mL of Marcaine injected easily.   Completed without difficulty   Pain partially resolved suggesting accurate placement of the medication.   Advised to call if fevers/chills, erythema, induration, drainage, or persistent bleeding.   Images permanently stored and available for review in the ultrasound unit.  Impression: Technically successful ultrasound guided injection.   Procedure: Real-time Ultrasound Guided Injection of right piriformis tendon insertion at greater trochanter/trochanteric bursitis Device: Philips Affiniti 50G Images permanently stored and available for review in the ultrasound unit. Verbal informed consent obtained.  Discussed risks and benefits of procedure. Warned about infection bleeding damage to structures skin hypopigmentation and fat atrophy among others. Patient expresses understanding and agreement Time-out conducted.   Noted no overlying erythema, induration, or other signs of local infection.   Skin prepped in a sterile fashion.   Local anesthesia: Topical Ethyl chloride.   With sterile technique and under real time ultrasound guidance:  40 mg of Kenalog and 2 mL of Marcaine injected easily.   Completed without difficulty   Pain nearly completely resolved suggesting accurate placement of the medication.  Advised to call if fevers/chills, erythema, induration, drainage, or persistent bleeding.   Images permanently stored and available for review in the ultrasound unit.  Impression: Technically successful ultrasound guided injection.  Addendum: Site of injection changed to reflect  correct side.      Assessment and Plan: 64 y.o. female with multifactorial hip and low back pain.  Patient is getting some benefit from physical therapy but dry needling seems to be worsening her symptoms.  Advise discontinuing dry needling but continue strengthening and stretching exercises.  Today plan for injection at left SI joint and right piriformis insertion on the greater trochanter.  She had great benefit from this in clinic.  Recheck back as needed.  If not better would consider hip or pelvis MRI to further characterize pain.   PDMP not reviewed this encounter. Orders Placed This Encounter  Procedures  . [place] LIMITED JOINT SPACE STRUCTURES LOW BILAT(NO LINKED CHARGES)    Order Specific Question:   Reason for Exam (SYMPTOM  OR DIAGNOSIS REQUIRED)    Answer:   bl hip pain    Order Specific Question:   Preferred imaging location?    Answer:   [facility]   No orders of the defined types were placed in this encounter.    Discussed warning signs or symptoms. Please see discharge instructions. Patient expresses understanding.   The above documentation has been reviewed and is accurate and complete [name]

## 2019-06-07 ENCOUNTER — Other Ambulatory Visit: Payer: Self-pay

## 2019-06-07 ENCOUNTER — Ambulatory Visit: Payer: BC Managed Care – PPO | Admitting: Physical Therapy

## 2019-06-07 ENCOUNTER — Encounter: Payer: BLUE CROSS/BLUE SHIELD | Admitting: Physical Therapy

## 2019-06-07 ENCOUNTER — Encounter: Payer: Self-pay | Admitting: Physical Therapy

## 2019-06-07 DIAGNOSIS — M25512 Pain in left shoulder: Secondary | ICD-10-CM | POA: Diagnosis not present

## 2019-06-07 DIAGNOSIS — M545 Low back pain: Secondary | ICD-10-CM | POA: Diagnosis not present

## 2019-06-07 DIAGNOSIS — M542 Cervicalgia: Secondary | ICD-10-CM | POA: Diagnosis not present

## 2019-06-07 DIAGNOSIS — G8929 Other chronic pain: Secondary | ICD-10-CM

## 2019-06-07 DIAGNOSIS — M546 Pain in thoracic spine: Secondary | ICD-10-CM | POA: Diagnosis not present

## 2019-06-07 DIAGNOSIS — M25511 Pain in right shoulder: Secondary | ICD-10-CM | POA: Diagnosis not present

## 2019-06-07 NOTE — Therapy (Signed)
[facility] [address] [place], [place], [address] Phone: [phone]   Fax:  [phone]  Physical Therapy Treatment  Patient Details  Name: Samantha Cox MRN: [id number] Date of Birth: 06-27-1955 Referring Provider (PT): [name], MD   Encounter Date: 06/07/2019  PT End of Session - 06/07/19 0941    Visit Number  10    Number of Visits  17    Date for PT Re-Evaluation  06/21/19    Authorization Type  30 VL    PT Start Time  0939   pt. arrive late   PT Stop Time  1015    PT Time Calculation (min)  36 min       Past Medical History:  Diagnosis Date  . History of tobacco use   . Routine general medical examination at a health care facility   . Screening for lipoid disorders     Past Surgical History:  Procedure Laterality Date  . OTHER SURGICAL HISTORY     Birthmark removed from forehead 7th grade    There were no vitals filed for this visit.  Subjective Assessment - 06/07/19 0951    Subjective  Pt. saw Dr. [name] on 4/28 and had SI joint and right piriformis injected after which she has noted significant relief. Dry needling was noted to exacerbate symptoms so plan hold further needling to focus more on exercises, stretches.    Pertinent History  X-rays positive for lumbar dextro-scoliosis and cervical DDD    Currently in Pain?  Yes    Pain Score  3     Pain Location  Back    Pain Orientation  Lower    Pain Descriptors / Indicators  --   "twinges"   Pain Type  Chronic pain    Pain Radiating Towards  hips left>right today    Pain Onset  More than a month ago    Pain Frequency  Constant    Aggravating Factors   sitting, walking, activity    Pain Relieving Factors  sitting is better than activity         [facility] PT Assessment - 06/07/19 0001      ROM / Strength   AROM / PROM / Strength  Strength      Strength   Strength Assessment Site  Hip    Right/Left Hip  Right;Left    Right Hip Extension   5/5    Right Hip ABduction  5/5    Left Hip Extension  4+/5    Left Hip ABduction  4+/5                   [facility] Adult PT Treatment/Exercise - 06/07/19 0001      Lumbar Exercises: Stretches   Passive Hamstring Stretch  Right;Left;3 reps;30 seconds    Piriformis Stretch  Right;Left;3 reps;20 seconds      Lumbar Exercises: Standing   Functional Squats Limitations  TRX hip abd squat blue band 2x10    Other Standing Lumbar Exercises  Monster walk and sidestep partial squat blue band proximal to knees 15 feet x 4 ea.      Lumbar Exercises: Supine   Bridge with [employer]  15 reps    Bridge with clamshell  15 reps    Bridge with [employer] Limitations  blue band      Lumbar Exercises: Sidelying   Clam  Right;Left;20 reps    Clam Limitations  blue band  Manual Therapy   Joint Mobilization  LAD bilat. hips grade I-III    Soft tissue mobilization  IASTM/gentle foam roll bilat. piriformis             PT Education - 06/07/19 1008    Education Details  HEP variations for piriformis stretch    Person(s) Educated  Patient    Methods  Explanation;Demonstration    Comprehension  Verbalized understanding       PT Short Term Goals - 05/29/19 1737      PT SHORT TERM GOAL #1   Title  Pt will be able to complete HEPs with an at worst pain of 6/10    Status  Achieved      PT SHORT TERM GOAL #2   Title  Pt. will be able to demonstrate and verbalize proper posture    Baseline  requires further instruction    Status  On-going        PT Long Term Goals - 04/26/19 1249      PT LONG TERM GOAL #1   Title  Patient will achieve a cervical rotation bilaterally of </= 60 degrees    Baseline  Patient demonstrated a Lt. cervical rotation of 42 degrees    Time  8    Period  Weeks    Status  New    Target Date  06/21/19      PT LONG TERM GOAL #2   Title  Patient will report an at worst pain 3/10    Baseline  Patients worst pain is an 8/10    Time  8    Period   Weeks    Status  New    Target Date  06/21/19      PT LONG TERM GOAL #3   Title  Patient will report no more than 2 HA/week    Baseline  Patient reports multiple HA throughout the week    Time  8    Period  Weeks    Status  New    Target Date  06/21/19      PT LONG TERM GOAL #4   Title  Independent with long term HEP for continued care after d/c    Baseline  will progress and establish as appropriate    Time  8    Period  Weeks    Status  New    Target Date  06/21/19            Plan - 06/07/19 1009    Clinical Impression Statement  Held dry needling as noted in subjective with more exercise focus. Session well-tolerated with minimal comfort and improved activity tolerance after injection. Progress ongoing re: therapy goals.    Personal Factors and Comorbidities  Age;Behavior Pattern;Profession    Examination-Activity Limitations  Other;Carry;Continence;Sit;Sleep;Squat;Stand    Examination-Participation Restrictions  Other    Stability/Clinical Decision Making  Evolving/Moderate complexity    Clinical Decision Making  Moderate    Rehab Potential  Fair    PT Frequency  2x / week    PT Duration  8 weeks    PT Treatment/Interventions  Moist Heat;Therapeutic activities;Therapeutic exercise;Balance training;Neuromuscular re-education;Manual techniques;Passive range of motion;Joint Manipulations;Iontophoresis 4mg /ml Dexamethasone;ADLs/Self Care Home Management;Cryotherapy;Electrical Stimulation;Traction;Ultrasound;Gait training;Patient/family education;Taping;Vestibular;Spinal Manipulations    PT Next Visit Plan  Hold dry needling with more focus exercises, gentle manual as tolerated    PT Home Exercise Plan  chin tucks, cervical rotation snag, qped thoracic rotation, HS stretching; AHSS, piriformis stretch, QL stretch (left),  pelvic tilt, bridge &  dead bug with LE ext rot., SKTC vs. seated trunk flexion stretch, pall off press, Theraband row and ext, front squat with KB vs. DB     Consulted and Agree with Plan of Care  Patient       Patient will benefit from skilled therapeutic intervention in order to improve the following deficits and impairments:  Decreased range of motion, Dizziness, Pain, Decreased balance, Hypomobility, Decreased strength, Decreased mobility, Increased muscle spasms, Improper body mechanics, Postural dysfunction, Impaired flexibility, Decreased activity tolerance  Visit Diagnosis: Cervicalgia  Chronic left shoulder pain  Chronic right shoulder pain  Pain in thoracic spine  Chronic bilateral low back pain without sciatica     Problem List Patient Active Problem List   Diagnosis Date Noted  . Facet arthritis of lumbar region 03/05/2019  . Chronic rhinitis 12/19/2018  . Shortness of breath 12/19/2018  . Ex-cigarette smoker 12/19/2018  . Greater trochanteric bursitis of right hip 07/10/2018  . Degenerative lumbar spinal stenosis 10/17/2016  . Leg cramping 09/19/2016  . Morton's neuroma of left foot 05/24/2016  . Greater trochanteric bursitis of left hip 11/25/2014  . Degenerative arthritis of hip 01/16/2014  . Acetabular labrum tear 01/16/2014    Lazarus Gowda, PT, DPT 06/07/19 10:16 AM  [facility] [address] [place], [place], [address] Phone: [phone]   Fax:  [phone]  Name: MINDEE ROBLEDO MRN: [id number] Date of Birth: 30-Apr-1955

## 2019-06-10 ENCOUNTER — Encounter: Payer: BLUE CROSS/BLUE SHIELD | Admitting: Physical Therapy

## 2019-06-12 ENCOUNTER — Ambulatory Visit: Payer: BC Managed Care – PPO | Attending: Family Medicine | Admitting: Physical Therapy

## 2019-06-12 ENCOUNTER — Other Ambulatory Visit: Payer: Self-pay

## 2019-06-12 ENCOUNTER — Encounter: Payer: Self-pay | Admitting: Physical Therapy

## 2019-06-12 DIAGNOSIS — M25511 Pain in right shoulder: Secondary | ICD-10-CM | POA: Insufficient documentation

## 2019-06-12 DIAGNOSIS — G8929 Other chronic pain: Secondary | ICD-10-CM | POA: Diagnosis not present

## 2019-06-12 DIAGNOSIS — M546 Pain in thoracic spine: Secondary | ICD-10-CM

## 2019-06-12 DIAGNOSIS — M25512 Pain in left shoulder: Secondary | ICD-10-CM | POA: Insufficient documentation

## 2019-06-12 DIAGNOSIS — M542 Cervicalgia: Secondary | ICD-10-CM

## 2019-06-12 DIAGNOSIS — M545 Low back pain: Secondary | ICD-10-CM | POA: Insufficient documentation

## 2019-06-12 NOTE — Therapy (Signed)
[place], [place], [address] Phone: [phone]   Fax:  [phone]  Physical Therapy Treatment  Patient Details  Name: Samantha Cox MRN: [id number] Date of Birth: [birth year] Referring Provider (PT): Lynne Leader, MD   Encounter Date: 06/12/2019  PT End of Session - 06/12/19 0936    Visit Number  11    Number of Visits  17    Date for PT Re-Evaluation  06/21/19    Authorization Type  30 VL    PT Start Time  0930    PT Stop Time  1013    PT Time Calculation (min)  43 min    Activity Tolerance  Patient tolerated treatment well    Behavior During Therapy  [facility] for tasks assessed/performed       Past Medical History:  Diagnosis Date  . History of tobacco use   . Routine general medical examination at a health care facility   . Screening for lipoid disorders     Past Surgical History:  Procedure Laterality Date  . OTHER SURGICAL HISTORY     Birthmark removed from forehead 7th grade    There were no vitals filed for this visit.  Subjective Assessment - 06/12/19 0934    Subjective  Still noting improvement after injection for SI and hip pain though still some mild symptoms. Also noting some neck/left upper trapezius tightness and pain this AM 3/10. She also reports some recent issues with vertigo (last episode 2 weeks ago) for which she had extensive work-up at [facility] with possible cervicogenic vertigo suspected.    Pertinent History  X-rays positive for lumbar dextro-scoliosis and cervical DDD                       [facility] Adult PT Treatment/Exercise - 06/12/19 0001      Lumbar Exercises: Stretches   Passive Hamstring Stretch  Right;Left;2 reps;30 seconds    Piriformis Stretch  Right;Left;2 reps;30 seconds      Lumbar Exercises: Aerobic   Nustep  L4 x 5 min UE/LE      Lumbar Exercises: Standing   Functional Squats Limitations  KB front squat 2x10 cues to reach hips back    Row  Limitations  [employer] cable row 2x10 with 10 lbs. bilat. UE    Shoulder Extension Limitations  [employer] cable extension 7 lbs. 2x10 cues to extend elbows and maintain slight knee flexion    Other Standing Lumbar Exercises  Pall off press 7 lbs. on cable x 15 ea. way bilat.      Lumbar Exercises: Supine   Bridge with [employer]  15 reps    Bridge with [employer] Limitations  pillow squeeze    Bridge with clamshell  15 reps    Bridge with [employer] Limitations  green band      Manual Therapy   Soft tissue mobilization  left upper trapezius region, IASTM/roller to bilat. piriformis      Neck Exercises: Stretches   Upper Trapezius Stretch  Left;2 reps;30 seconds    Levator Stretch  Left;2 reps;30 seconds               PT Short Term Goals - 05/29/19 1737      PT SHORT TERM GOAL #1   Title  Pt will be able to complete HEPs with an at worst pain of 6/10    Status  Achieved      PT SHORT TERM GOAL #  2   Title  Pt. will be able to demonstrate and verbalize proper posture    Baseline  requires further instruction    Status  On-going        PT Long Term Goals - 04/26/19 1249      PT LONG TERM GOAL #1   Title  Patient will achieve a cervical rotation bilaterally of </= 60 degrees    Baseline  Patient demonstrated a Lt. cervical rotation of 42 degrees    Time  8    Period  Weeks    Status  New    Target Date  06/21/19      PT LONG TERM GOAL #2   Title  Patient will report an at worst pain 3/10    Baseline  Patients worst pain is an 8/10    Time  8    Period  Weeks    Status  New    Target Date  06/21/19      PT LONG TERM GOAL #3   Title  Patient will report no more than 2 HA/week    Baseline  Patient reports multiple HA throughout the week    Time  8    Period  Weeks    Status  New    Target Date  06/21/19      PT LONG TERM GOAL #4   Title  Independent with long term HEP for continued care after d/c    Baseline  will progress and establish as  appropriate    Time  8    Period  Weeks    Status  New    Target Date  06/21/19            Plan - 06/12/19 1013    Clinical Impression Statement  Continued previous emphasis exercises and manual (held dry needling as noted last session) with today inclusion of work on neck/left upper trapezius region. Still noting improvement with SI region after injection and progressing with exercise/activity tolerance, fair status overall with therapy goals impacted by chronicity of symptoms and multiple tx. areas.    Personal Factors and Comorbidities  Age;Behavior Pattern;Profession    Examination-Activity Limitations  Other;Carry;Continence;Sit;Sleep;Squat;Stand    Examination-Participation Restrictions  Other    Stability/Clinical Decision Making  Evolving/Moderate complexity    Clinical Decision Making  Moderate    Rehab Potential  Fair    PT Frequency  2x / week    PT Duration  8 weeks    PT Treatment/Interventions  Moist Heat;Therapeutic activities;Therapeutic exercise;Balance training;Neuromuscular re-education;Manual techniques;Passive range of motion;Joint Manipulations;Iontophoresis 4mg /ml Dexamethasone;ADLs/Self Care Home Management;Cryotherapy;Electrical Stimulation;Traction;Ultrasound;Gait training;Patient/family education;Taping;Vestibular;Spinal Manipulations    PT Next Visit Plan  Hold dry needling with more focus exercises, gentle manual as tolerated    PT Home Exercise Plan  chin tucks, cervical rotation snag, qped thoracic rotation, HS stretching; AHSS, piriformis stretch, QL stretch (left),  pelvic tilt, bridge & dead bug with LE ext rot., SKTC vs. seated trunk flexion stretch, pall off press, Theraband row and ext, front squat with KB vs. DB    Consulted and Agree with Plan of Care  Patient       Patient will benefit from skilled therapeutic intervention in order to improve the following deficits and impairments:  Decreased range of motion, Dizziness, Pain, Decreased balance,  Hypomobility, Decreased strength, Decreased mobility, Increased muscle spasms, Improper body mechanics, Postural dysfunction, Impaired flexibility, Decreased activity tolerance  Visit Diagnosis: Cervicalgia  Chronic left shoulder pain  Chronic right shoulder pain  Pain  in thoracic spine  Chronic bilateral low back pain without sciatica     Problem List Patient Active Problem List   Diagnosis Date Noted  . Facet arthritis of lumbar region 03/05/2019  . Chronic rhinitis 12/19/2018  . Shortness of breath 12/19/2018  . Ex-cigarette smoker 12/19/2018  . Greater trochanteric bursitis of right hip 07/10/2018  . Degenerative lumbar spinal stenosis 10/17/2016  . Leg cramping 09/19/2016  . Morton's neuroma of left foot 05/24/2016  . Greater trochanteric bursitis of left hip 11/25/2014  . Degenerative arthritis of hip 01/16/2014  . Acetabular labrum tear 01/16/2014    Lazarus Gowda, PT, DPT 06/12/19 10:20 AM  [facility] [address] [place], [place], [address] Phone: [phone]   Fax:  [phone]  Name: Samantha Cox MRN: [id number] Date of Birth: 1955/04/06

## 2019-06-14 ENCOUNTER — Ambulatory Visit: Payer: BC Managed Care – PPO | Admitting: Physical Therapy

## 2019-06-14 ENCOUNTER — Encounter: Payer: BLUE CROSS/BLUE SHIELD | Admitting: Physical Therapy

## 2019-06-17 ENCOUNTER — Encounter: Payer: Self-pay | Admitting: Physical Therapy

## 2019-06-17 ENCOUNTER — Ambulatory Visit: Payer: BC Managed Care – PPO | Admitting: Physical Therapy

## 2019-06-17 ENCOUNTER — Other Ambulatory Visit: Payer: Self-pay

## 2019-06-17 DIAGNOSIS — M25512 Pain in left shoulder: Secondary | ICD-10-CM

## 2019-06-17 DIAGNOSIS — M546 Pain in thoracic spine: Secondary | ICD-10-CM

## 2019-06-17 DIAGNOSIS — M545 Low back pain, unspecified: Secondary | ICD-10-CM

## 2019-06-17 DIAGNOSIS — M542 Cervicalgia: Secondary | ICD-10-CM

## 2019-06-17 DIAGNOSIS — G8929 Other chronic pain: Secondary | ICD-10-CM

## 2019-06-17 DIAGNOSIS — M25511 Pain in right shoulder: Secondary | ICD-10-CM | POA: Diagnosis not present

## 2019-06-17 NOTE — Therapy (Signed)
[place], [place], [address] Phone: [phone]   Fax:  [phone]  Physical Therapy Treatment  Patient Details  Name: Samantha Cox MRN: [id number] Date of Birth: 12-Mar-1955 Referring Provider (PT): Lynne Leader, MD   Encounter Date: 06/17/2019  PT End of Session - 06/17/19 1009    Visit Number  12    Number of Visits  17    Date for PT Re-Evaluation  06/21/19    Authorization Type  30 VL    PT Start Time  0935    PT Stop Time  7672    PT Time Calculation (min)  39 min    Activity Tolerance  Patient tolerated treatment well    Behavior During Therapy  [facility] for tasks assessed/performed       Past Medical History:  Diagnosis Date  . History of tobacco use   . Routine general medical examination at a health care facility   . Screening for lipoid disorders     Past Surgical History:  Procedure Laterality Date  . OTHER SURGICAL HISTORY     Birthmark removed from forehead 7th grade    There were no vitals filed for this visit.  Subjective Assessment - 06/17/19 0944    Subjective  Pt. reports some recent right foot pain issues and had some recent cramping in right lower leg and foot region. History of neuroma left foot but no past history right foot pain. Still with some left levator/upper trap region pain and mild pain in SI region on left.    Pertinent History  X-rays positive for lumbar dextro-scoliosis and cervical DDD    Currently in Pain?  Yes    Pain Score  2     Pain Location  Back    Pain Orientation  Left;Lower    Pain Descriptors / Indicators  Sharp    Pain Type  Chronic pain    Pain Onset  More than a month ago    Pain Frequency  Intermittent    Aggravating Factors   movement but unclear specific exacerbating activities    Pain Relieving Factors  salon pa patches, medication    Pain Location  Neck    Pain Onset  More than a month ago    Aggravating Factors   neck rotation                        [facility] Adult PT Treatment/Exercise - 06/17/19 0001      Lumbar Exercises: Stretches   Passive Hamstring Stretch  Right;Left;2 reps;30 seconds    Piriformis Stretch  Right;Left;3 reps;30 seconds      Lumbar Exercises: Standing   Functional Squats Limitations  TRX squat x 20 reps cues to avoid knee flexion past toes      Lumbar Exercises: Supine   Clam  20 reps    Clam Limitations  alt. unilat. with blue band    Dead Bug  20 reps    Dead Bug Limitations  2 lb. weight ea. UE/LE bilat.    Bridge with [employer]  15 reps    Bridge with [employer] Limitations  pillow squeeze    Bridge with clamshell  15 reps    Bridge with [employer] Limitations  blue band      Manual Therapy   Joint Mobilization  hip LAD grade I-III oscillations bilat.    Soft tissue mobilization  left upper trapezius region, IASTM/roller to bilat.  piriformis      Neck Exercises: Stretches   Upper Trapezius Stretch  Left;2 reps;30 seconds    Levator Stretch  Left;2 reps;30 seconds             PT Education - 06/17/19 1014    Education Details  POC    Person(s) Educated  Patient    Methods  Explanation    Comprehension  Verbalized understanding       PT Short Term Goals - 05/29/19 1737      PT SHORT TERM GOAL #1   Title  Pt will be able to complete HEPs with an at worst pain of 6/10    Status  Achieved      PT SHORT TERM GOAL #2   Title  Pt. will be able to demonstrate and verbalize proper posture    Baseline  requires further instruction    Status  On-going        PT Long Term Goals - 04/26/19 1249      PT LONG TERM GOAL #1   Title  Patient will achieve a cervical rotation bilaterally of </= 60 degrees    Baseline  Patient demonstrated a Lt. cervical rotation of 42 degrees    Time  8    Period  Weeks    Status  New    Target Date  06/21/19      PT LONG TERM GOAL #2   Title  Patient will report an at worst pain 3/10    Baseline  Patients worst  pain is an 8/10    Time  8    Period  Weeks    Status  New    Target Date  06/21/19      PT LONG TERM GOAL #3   Title  Patient will report no more than 2 HA/week    Baseline  Patient reports multiple HA throughout the week    Time  8    Period  Weeks    Status  New    Target Date  06/21/19      PT LONG TERM GOAL #4   Title  Independent with long term HEP for continued care after d/c    Baseline  will progress and establish as appropriate    Time  8    Period  Weeks    Status  New    Target Date  06/21/19            Plan - 06/17/19 1016    Clinical Impression Statement  SI region overall improving with more consistency of symptom relief particularly after injections but still with symptoms as noted per subjective. Neck also improving but still with mild-moderate pain left levator and upper trapezius region for which suspect predominately myofascial etiology with underlying degenerative changes. ERO due next session and plan continue PT for further progress to address remaining functional limitations.    Personal Factors and Comorbidities  Age;Behavior Pattern;Profession    Examination-Activity Limitations  Other;Carry;Continence;Sit;Sleep;Squat;Stand    Examination-Participation Restrictions  Other    Stability/Clinical Decision Making  Evolving/Moderate complexity    Clinical Decision Making  Moderate    Rehab Potential  Fair    PT Frequency  2x / week    PT Duration  8 weeks    PT Treatment/Interventions  Moist Heat;Therapeutic activities;Therapeutic exercise;Balance training;Neuromuscular re-education;Manual techniques;Passive range of motion;Joint Manipulations;Iontophoresis 4mg /ml Dexamethasone;ADLs/Self Care Home Management;Cryotherapy;Electrical Stimulation;Traction;Ultrasound;Gait training;Patient/family education;Taping;Vestibular;Spinal Manipulations    PT Next Visit Plan  ERO next visit, Hold dry needling  with more focus exercises, gentle manual as tolerated    PT  Home Exercise Plan  chin tucks, cervical rotation snag, qped thoracic rotation, HS stretching; AHSS, piriformis stretch, QL stretch (left),  pelvic tilt, bridge & dead bug with LE ext rot., SKTC vs. seated trunk flexion stretch, pall off press, Theraband row and ext, front squat with KB vs. DB    Consulted and Agree with Plan of Care  Patient       Patient will benefit from skilled therapeutic intervention in order to improve the following deficits and impairments:  Decreased range of motion, Dizziness, Pain, Decreased balance, Hypomobility, Decreased strength, Decreased mobility, Increased muscle spasms, Improper body mechanics, Postural dysfunction, Impaired flexibility, Decreased activity tolerance  Visit Diagnosis: Cervicalgia  Chronic left shoulder pain  Chronic right shoulder pain  Pain in thoracic spine  Chronic bilateral low back pain without sciatica     Problem List Patient Active Problem List   Diagnosis Date Noted  . Facet arthritis of lumbar region 03/05/2019  . Chronic rhinitis 12/19/2018  . Shortness of breath 12/19/2018  . Ex-cigarette smoker 12/19/2018  . Greater trochanteric bursitis of right hip 07/10/2018  . Degenerative lumbar spinal stenosis 10/17/2016  . Leg cramping 09/19/2016  . Morton's neuroma of left foot 05/24/2016  . Greater trochanteric bursitis of left hip 11/25/2014  . Degenerative arthritis of hip 01/16/2014  . Acetabular labrum tear 01/16/2014    Lazarus Gowda, PT, DPT 06/17/19 10:19 AM  [facility] [address] [place], [place], [address] Phone: [phone]   Fax:  [phone]  Name: Samantha Cox MRN: [id number] Date of Birth: 1955/03/27

## 2019-06-18 ENCOUNTER — Other Ambulatory Visit: Payer: Self-pay | Admitting: Family Medicine

## 2019-06-18 DIAGNOSIS — Z1231 Encounter for screening mammogram for malignant neoplasm of breast: Secondary | ICD-10-CM

## 2019-06-19 ENCOUNTER — Encounter: Payer: Self-pay | Admitting: Physical Therapy

## 2019-06-19 ENCOUNTER — Ambulatory Visit: Payer: BC Managed Care – PPO | Admitting: Physical Therapy

## 2019-06-19 ENCOUNTER — Other Ambulatory Visit: Payer: Self-pay

## 2019-06-19 DIAGNOSIS — M546 Pain in thoracic spine: Secondary | ICD-10-CM

## 2019-06-19 DIAGNOSIS — M25512 Pain in left shoulder: Secondary | ICD-10-CM | POA: Diagnosis not present

## 2019-06-19 DIAGNOSIS — M545 Low back pain: Secondary | ICD-10-CM | POA: Diagnosis not present

## 2019-06-19 DIAGNOSIS — M25511 Pain in right shoulder: Secondary | ICD-10-CM | POA: Diagnosis not present

## 2019-06-19 DIAGNOSIS — M542 Cervicalgia: Secondary | ICD-10-CM | POA: Diagnosis not present

## 2019-06-19 DIAGNOSIS — G8929 Other chronic pain: Secondary | ICD-10-CM

## 2019-06-19 NOTE — Therapy (Signed)
[place], [place], [address] Phone: [phone]   Fax:  [phone]  Physical Therapy Treatment/Recertification  Patient Details  Name: Samantha Cox MRN: [id number] Date of Birth: Apr 29, 1955 Referring Provider (PT): Lynne Leader, MD   Encounter Date: 06/19/2019  PT End of Session - 06/19/19 0941    Visit Number  13    Number of Visits  25    Date for PT Re-Evaluation  07/31/19    Authorization Type  30 VL    PT Start Time  0940   pt. arrived late   PT Stop Time  1015    PT Time Calculation (min)  35 min    Activity Tolerance  Patient tolerated treatment well    Behavior During Therapy  [facility] for tasks assessed/performed       Past Medical History:  Diagnosis Date  . History of tobacco use   . Routine general medical examination at a health care facility   . Screening for lipoid disorders     Past Surgical History:  Procedure Laterality Date  . OTHER SURGICAL HISTORY     Birthmark removed from forehead 7th grade    There were no vitals filed for this visit.  Subjective Assessment - 06/19/19 1019    Subjective  Neck: pt. noting improvement with decreased headaches and vertigo symptoms from previous status, objectively with improving cervical rotation AROM but still with local tenderness and pain in left levator and upper trapezius region. SI/LBP: Pt. continues to note benefit from recent injections but still with intermittent local symptoms including pain into hip and buttock region.         [facility] PT Assessment - 06/19/19 0001      AROM   Cervical Flexion  50    Cervical Extension  43    Cervical - Right Side Bend  32    Cervical - Left Side Bend  42    Cervical - Right Rotation  77    Cervical - Left Rotation  66    Lumbar Flexion  70    Lumbar Extension  20    Lumbar - Right Side Bend  37    Lumbar - Left Side Bend  37    Lumbar - Right Rotation  WFL    Lumbar - Left Rotation   80%      Strength   Overall Strength Comments  Bilat. UE and LE MMTs grossly 5/5                   [facility] Adult PT Treatment/Exercise - 06/19/19 0001      Neck Exercises: Supine   Neck Retraction  15 reps      Lumbar Exercises: Stretches   Piriformis Stretch  Right;Left;3 reps;30 seconds      Manual Therapy   Soft tissue mobilization  left upper trapezius region, IASTM/roller to bilat. piriformis      Neck Exercises: Stretches   Upper Trapezius Stretch  Left;3 reps;30 seconds   supine manual stretch   Levator Stretch  Left;3 reps;30 seconds   supine manual stretch            PT Education - 06/19/19 1020    Education Details  POC    Person(s) Educated  Patient    Methods  Explanation    Comprehension  Verbalized understanding       PT Short Term Goals - 06/19/19 0947      PT SHORT TERM GOAL #  1   Title  Pt will be able to complete HEPs with at worst pain of 6/10    Baseline  met    Time  3    Period  Weeks    Status  Achieved      PT SHORT TERM GOAL #2   Title  Pt. will be able to demonstrate and verbalize proper posture    Baseline  still some tendency forward head/slouched posture but corrects when aware    Time  3    Period  Weeks    Status  On-going        PT Long Term Goals - 06/19/19 0948      PT LONG TERM GOAL #1   Title  Patient will achieve a cervical rotation bilaterally of </= 60 degrees    Baseline  met, update for 70 deg    Time  6    Period  Weeks    Status  Revised    Target Date  07/31/19      PT LONG TERM GOAL #2   Title  Patient will report an at worst pain 3/10    Baseline  improving but pain intermittently higher    Time  6    Period  Weeks    Status  On-going    Target Date  07/31/19      PT LONG TERM GOAL #3   Title  Patient will report no more than 2 HA/week    Baseline  met    Time  6    Period  Weeks    Status  Achieved    Target Date  07/31/19      PT LONG TERM GOAL #4   Title  Independent with  long term HEP for continued care after d/c    Baseline  will progress and establish as appropriate    Time  6    Period  Weeks    Status  On-going    Target Date  07/31/19            Plan - 06/19/19 1020    Clinical Impression Statement  Have held further dry needling per recommendations and plan from last MD follow up. Fair progress overall with therapy to date-improvement as noted in subjective for cervical region pain but still with local myofascial symptoms on left side impacting activity and positional tolerance. Fair progress for lumbar and SI region as well with PT-improvement after injections but still with some mild-moderate intermittent symptoms with pain into hpi and buttock region. Status impacted by chronic symptom history, underlying DDD/scoliosis and multiple tx. areas involved. Pt. also still pending referral to pain management. Plan continue PT for a few more weeks to continue progress then d/c to HEP as appropriate.    Personal Factors and Comorbidities  Age;Behavior Pattern;Profession    Examination-Activity Limitations  Other;Carry;Continence;Sit;Sleep;Squat;Stand    Examination-Participation Restrictions  Other    Stability/Clinical Decision Making  Evolving/Moderate complexity    Clinical Decision Making  Moderate    Rehab Potential  Fair    PT Frequency  --   1-2x/week   PT Duration  6 weeks    PT Treatment/Interventions  Moist Heat;Therapeutic activities;Therapeutic exercise;Balance training;Neuromuscular re-education;Manual techniques;Passive range of motion;Joint Manipulations;Iontophoresis '4mg'$ /ml Dexamethasone;ADLs/Self Care Home Management;Cryotherapy;Electrical Stimulation;Traction;Ultrasound;Gait training;Patient/family education;Taping;Vestibular;Spinal Manipulations    PT Next Visit Plan  ERO next visit, Hold dry needling with more focus exercises, gentle manual as tolerated    PT Home Exercise Plan  chin tucks, cervical rotation snag,  qped thoracic  rotation, HS stretching; AHSS, piriformis stretch, QL stretch (left),  pelvic tilt, bridge & dead bug with LE ext rot., SKTC vs. seated trunk flexion stretch, pall off press, Theraband row and ext, front squat with KB vs. DB    Consulted and Agree with Plan of Care  Patient       Patient will benefit from skilled therapeutic intervention in order to improve the following deficits and impairments:  Decreased range of motion, Dizziness, Pain, Decreased balance, Hypomobility, Decreased strength, Decreased mobility, Increased muscle spasms, Improper body mechanics, Postural dysfunction, Impaired flexibility, Decreased activity tolerance  Visit Diagnosis: Cervicalgia  Chronic left shoulder pain  Chronic right shoulder pain  Pain in thoracic spine  Chronic bilateral low back pain without sciatica     Problem List Patient Active Problem List   Diagnosis Date Noted  . Facet arthritis of lumbar region 03/05/2019  . Chronic rhinitis 12/19/2018  . Shortness of breath 12/19/2018  . Ex-cigarette smoker 12/19/2018  . Greater trochanteric bursitis of right hip 07/10/2018  . Degenerative lumbar spinal stenosis 10/17/2016  . Leg cramping 09/19/2016  . Morton's neuroma of left foot 05/24/2016  . Greater trochanteric bursitis of left hip 11/25/2014  . Degenerative arthritis of hip 01/16/2014  . Acetabular labrum tear 01/16/2014    Beaulah Dinning, PT, DPT 06/19/19 10:25 AM  [facility] [address] [place], [place], [address] Phone: [phone]   Fax:  [phone]  Name: Samantha Cox MRN: [id number] Date of Birth: [birth year]

## 2019-06-25 ENCOUNTER — Ambulatory Visit
Admission: RE | Admit: 2019-06-25 | Discharge: 2019-06-25 | Disposition: A | Discharge status: Home / Self Care | Payer: BC Managed Care – PPO | Source: Ambulatory Visit | Attending: Family Medicine | Admitting: Family Medicine

## 2019-06-25 ENCOUNTER — Encounter: Payer: Self-pay | Admitting: Family Medicine

## 2019-06-25 ENCOUNTER — Other Ambulatory Visit: Payer: Self-pay

## 2019-06-25 ENCOUNTER — Other Ambulatory Visit: Payer: Self-pay | Admitting: Family Medicine

## 2019-06-25 DIAGNOSIS — Z1231 Encounter for screening mammogram for malignant neoplasm of breast: Secondary | ICD-10-CM | POA: Diagnosis not present

## 2019-06-28 ENCOUNTER — Encounter: Payer: Self-pay | Admitting: Physical Therapy

## 2019-06-28 ENCOUNTER — Ambulatory Visit: Payer: BC Managed Care – PPO | Admitting: Physical Therapy

## 2019-06-28 ENCOUNTER — Other Ambulatory Visit: Payer: Self-pay

## 2019-06-28 DIAGNOSIS — M546 Pain in thoracic spine: Secondary | ICD-10-CM

## 2019-06-28 DIAGNOSIS — M542 Cervicalgia: Secondary | ICD-10-CM

## 2019-06-28 DIAGNOSIS — M25511 Pain in right shoulder: Secondary | ICD-10-CM | POA: Diagnosis not present

## 2019-06-28 DIAGNOSIS — M25512 Pain in left shoulder: Secondary | ICD-10-CM | POA: Diagnosis not present

## 2019-06-28 DIAGNOSIS — G8929 Other chronic pain: Secondary | ICD-10-CM

## 2019-06-28 DIAGNOSIS — M545 Low back pain: Secondary | ICD-10-CM | POA: Diagnosis not present

## 2019-06-28 NOTE — Therapy (Signed)
[place], [place], [address] Phone: [phone]   Fax:  [phone]  Physical Therapy Treatment  Patient Details  Name: Samantha Cox MRN: [id number] Date of Birth: 07/06/1955 Referring Provider (PT): Lynne Leader, MD   Encounter Date: 06/28/2019  PT End of Session - 06/28/19 0955    Visit Number  14    Number of Visits  25    Date for PT Re-Evaluation  07/31/19    Authorization Type  30 VL    PT Start Time  4401   arrived late   PT Stop Time  0935    PT Time Calculation (min)  40 min    Activity Tolerance  Patient tolerated treatment well    Behavior During Therapy  [facility] for tasks assessed/performed       Past Medical History:  Diagnosis Date  . History of tobacco use   . Routine general medical examination at a health care facility   . Screening for lipoid disorders     Past Surgical History:  Procedure Laterality Date  . OTHER SURGICAL HISTORY     Birthmark removed from forehead 7th grade    There were no vitals filed for this visit.  Subjective Assessment - 06/28/19 0949    Subjective  Primary complaint is right piriformis region pain. For SI region still noting improvement after injection from MD. Pt. reports contacted MD and may be getting MRI for back/hip.    Pertinent History  X-rays positive for lumbar dextro-scoliosis and cervical DDD    Currently in Pain?  Yes    Pain Score  2     Pain Location  Hip    Pain Orientation  Right;Posterior;Lateral    Pain Type  Chronic pain    Pain Onset  More than a month ago    Pain Frequency  Intermittent    Aggravating Factors   prolonged sitting    Pain Relieving Factors  stretches, salon pa patches                        [facility] Adult PT Treatment/Exercise - 06/28/19 0001      Lumbar Exercises: Stretches   Passive Hamstring Stretch  Right;3 reps;30 seconds    Piriformis Stretch  Right;Left;3 reps;30 seconds       Lumbar Exercises: Supine   Bridge  20 reps    Bridge Limitations  unilat. hip bridge with legs crossed 2x10 ea. bilat.      Lumbar Exercises: Sidelying   Clam  Right;Left;20 reps    Clam Limitations  blue Theraband    Hip Abduction  Right;Left;20 reps    Hip Abduction Limitations  in slight hip extension for targeting of posterior fibers for hip abductors      Lumbar Exercises: Prone   Other Prone Lumbar Exercises  prone hip ER with blue Theraband 2x10 ea. bilat.      Manual Therapy   Joint Mobilization  LAD right hip grade I-III oscillations    Soft tissue mobilization  IASTM/foam roll right piriformis, right piriformis release with passive hip ER             PT Education - 06/28/19 0955    Education Details  exercises, SI/hip anatomy and symptom etiology    Person(s) Educated  Patient    Methods  Explanation;Verbal cues    Comprehension  Verbalized understanding;Returned demonstration       PT Short Term Goals - 06/19/19  0947      PT SHORT TERM GOAL #1   Title  Pt will be able to complete HEPs with at worst pain of 6/10    Baseline  met    Time  3    Period  Weeks    Status  Achieved      PT SHORT TERM GOAL #2   Title  Pt. will be able to demonstrate and verbalize proper posture    Baseline  still some tendency forward head/slouched posture but corrects when aware    Time  3    Period  Weeks    Status  On-going        PT Long Term Goals - 06/19/19 0948      PT LONG TERM GOAL #1   Title  Patient will achieve a cervical rotation bilaterally of </= 60 degrees    Baseline  met, update for 70 deg    Time  6    Period  Weeks    Status  Revised    Target Date  07/31/19      PT LONG TERM GOAL #2   Title  Patient will report an at worst pain 3/10    Baseline  improving but pain intermittently higher    Time  6    Period  Weeks    Status  On-going    Target Date  07/31/19      PT LONG TERM GOAL #3   Title  Patient will report no more than 2 HA/week     Baseline  met    Time  6    Period  Weeks    Status  Achieved    Target Date  07/31/19      PT LONG TERM GOAL #4   Title  Independent with long term HEP for continued care after d/c    Baseline  will progress and establish as appropriate    Time  6    Period  Weeks    Status  On-going    Target Date  07/31/19            Plan - 06/28/19 0956    Clinical Impression Statement  Continued focus exercises/stretches and manual therapy to decrease right hip/piriformis tightness and for lumbopelvic + hip stabilization and strengthening. Fair progress as previously with status impacted by underlying issues with scoliosis, DDD and chronic SI pain as well as multiple tx. areas for therapy. Plan continued PT per recertification last visit for further progress re: therapy goals.    Personal Factors and Comorbidities  Age;Behavior Pattern;Profession    Examination-Activity Limitations  Other;Carry;Continence;Sit;Sleep;Squat;Stand    Examination-Participation Restrictions  Other    Stability/Clinical Decision Making  Evolving/Moderate complexity    Clinical Decision Making  Moderate    Rehab Potential  Fair    PT Frequency  --   1-2x/week   PT Duration  6 weeks    PT Treatment/Interventions  Moist Heat;Therapeutic activities;Therapeutic exercise;Balance training;Neuromuscular re-education;Manual techniques;Passive range of motion;Joint Manipulations;Iontophoresis '4mg'$ /ml Dexamethasone;ADLs/Self Care Home Management;Cryotherapy;Electrical Stimulation;Traction;Ultrasound;Gait training;Patient/family education;Taping;Vestibular;Spinal Manipulations    PT Next Visit Plan  Hold dry needling with more focus exercises, manual therapy for STM and hip mobilization as tolerated    PT Home Exercise Plan  chin tucks, cervical rotation snag, qped thoracic rotation, HS stretching; AHSS, piriformis stretch, QL stretch (left),  pelvic tilt, bridge & dead bug with LE ext rot., SKTC vs. seated trunk flexion stretch,  pall off press, Theraband row and ext, front squat with  KB vs. DB    Consulted and Agree with Plan of Care  Patient       Patient will benefit from skilled therapeutic intervention in order to improve the following deficits and impairments:  Decreased range of motion, Dizziness, Pain, Decreased balance, Hypomobility, Decreased strength, Decreased mobility, Increased muscle spasms, Improper body mechanics, Postural dysfunction, Impaired flexibility, Decreased activity tolerance  Visit Diagnosis: Cervicalgia  Chronic left shoulder pain  Chronic right shoulder pain  Pain in thoracic spine  Chronic bilateral low back pain without sciatica     Problem List Patient Active Problem List   Diagnosis Date Noted  . Facet arthritis of lumbar region 03/05/2019  . Chronic rhinitis 12/19/2018  . Shortness of breath 12/19/2018  . Ex-cigarette smoker 12/19/2018  . Greater trochanteric bursitis of right hip 07/10/2018  . Degenerative lumbar spinal stenosis 10/17/2016  . Leg cramping 09/19/2016  . Morton's neuroma of left foot 05/24/2016  . Greater trochanteric bursitis of left hip 11/25/2014  . Degenerative arthritis of hip 01/16/2014  . Acetabular labrum tear 01/16/2014    Beaulah Dinning, PT, DPT 06/28/19 10:00 AM  [facility] [address] [place], [place], [address] Phone: [phone]   Fax:  [phone]  Name: NARIAH MORGANO MRN: [id number] Date of Birth: 08/10/1955

## 2019-07-03 ENCOUNTER — Ambulatory Visit: Payer: BC Managed Care – PPO | Admitting: Physical Therapy

## 2019-07-05 ENCOUNTER — Ambulatory Visit: Payer: BC Managed Care – PPO | Admitting: Physical Therapy

## 2019-07-12 ENCOUNTER — Encounter: Payer: Self-pay | Admitting: Family Medicine

## 2019-07-12 DIAGNOSIS — M48061 Spinal stenosis, lumbar region without neurogenic claudication: Secondary | ICD-10-CM

## 2019-07-12 DIAGNOSIS — M545 Low back pain, unspecified: Secondary | ICD-10-CM

## 2019-07-24 DIAGNOSIS — J449 Chronic obstructive pulmonary disease, unspecified: Secondary | ICD-10-CM | POA: Diagnosis not present

## 2019-07-25 ENCOUNTER — Other Ambulatory Visit: Payer: Self-pay

## 2019-07-25 ENCOUNTER — Encounter: Payer: Self-pay | Admitting: Family Medicine

## 2019-07-25 ENCOUNTER — Ambulatory Visit (INDEPENDENT_AMBULATORY_CARE_PROVIDER_SITE_OTHER): Payer: BC Managed Care – PPO | Admitting: Family Medicine

## 2019-07-25 VITALS — BP 110/78 | HR 90 | Ht 66.0 in | Wt 173.2 lb

## 2019-07-25 DIAGNOSIS — M545 Low back pain, unspecified: Secondary | ICD-10-CM

## 2019-07-25 DIAGNOSIS — M7061 Trochanteric bursitis, right hip: Secondary | ICD-10-CM | POA: Diagnosis not present

## 2019-07-25 NOTE — Progress Notes (Signed)
   I, [name], LAT, ATC, am serving as scribe for Dr. [name].  Samantha Cox is a 64 y.o. female who presents to [facility] today for f/u of L-sided low back and R hip pain.  She was last seen by Dr. [name] on 06/05/19 and had a L SIJ injection and  R GT injection.  She has completed 14 PT visits.  She has had multiple lumbar epidurals and most recently had B L4-5 facet injections on 03/14/19.  Since her last visit, pt notes that the injections immediately helped w/ her pain but notes that she has had a recent flare of her L mid-low back pain and R hip pain.  She is scheduled for a lumbar MRI on Saturday and wonders if she can get scheduled for a hip MRI at the same time if she is having considerable posterior/lateral hip pain as well.    Patient notes some clicking sensations in her hip.  Diagnostic imaging: L hip XR- 01/31/19; L-spine XR- 04/12/19; L-spine MRI- 07/27/19  Pertinent review of systems: No fevers or chills  Relevant historical information: Spinal stenosis   Exam:  BP 110/78 (BP Location: Right Arm, Patient Position: Sitting, Cuff Size: Normal)   Pulse 90   Ht 5\' 6"  (1.676 m)   Wt 173 lb 3.2 oz (78.6 kg)   SpO2 96%   BMI 27.96 kg/m  General: Well Developed, well nourished, and in no acute distress.   MSK:  Right hip normal-appearing Normal motion. Tender palpation posterior aspect of greater trochanter. Nontender ischial tuberosity Hip abduction strength is 5/5 without significant pain.  External rotation strength is 4/5 with pain.   X-ray images hip from December 2020 reviewed   Assessment and Plan: 64 y.o. female with right hip pain.  Concerning for hip rotator tendinopathy/bursitis.  At this point she has failed conservative management and will proceed to MRI to further characterize cause of pain.  Recheck following the lumbar and hip MRI.   PDMP not reviewed this encounter. Orders Placed This Encounter  Procedures    . MR HIP RIGHT WO CONTRAST    Standing Status:   Future    Standing Expiration Date:   07/24/2020    Order Specific Question:   ** REASON FOR EXAM (FREE TEXT)    Answer:   Trochanteric bursitis. Schedule with MRI Lspine    Order Specific Question:   What is the patient's sedation requirement?    Answer:   No Sedation    Order Specific Question:   Does the patient have a pacemaker or implanted devices?    Answer:   No    Order Specific Question:   Preferred imaging location?    Answer:   07/26/2020 (table limit-350lbs)    Order Specific Question:   Radiology Contrast Protocol - do NOT remove file path    Answer:   [email or web address]   No orders of the defined types were placed in this encounter.    Discussed warning signs or symptoms. Please see discharge instructions. Patient expresses understanding.   The above documentation has been reviewed and is accurate and complete Licensed conveyancer, M.D.

## 2019-07-25 NOTE — Patient Instructions (Signed)
Thank you for coming in today.  We will add on the hip MRI to the Lumbar spine MRI.  Lets schedule something next week to review the MRI results.

## 2019-07-27 ENCOUNTER — Ambulatory Visit (INDEPENDENT_AMBULATORY_CARE_PROVIDER_SITE_OTHER): Payer: BC Managed Care – PPO

## 2019-07-27 ENCOUNTER — Other Ambulatory Visit: Payer: Self-pay

## 2019-07-27 DIAGNOSIS — M7061 Trochanteric bursitis, right hip: Secondary | ICD-10-CM

## 2019-07-27 DIAGNOSIS — M545 Low back pain, unspecified: Secondary | ICD-10-CM

## 2019-07-27 DIAGNOSIS — M25551 Pain in right hip: Secondary | ICD-10-CM | POA: Diagnosis not present

## 2019-07-27 DIAGNOSIS — M48061 Spinal stenosis, lumbar region without neurogenic claudication: Secondary | ICD-10-CM

## 2019-07-29 NOTE — Progress Notes (Signed)
MRI hip shows tendinitis and trochanteric bursitis and a low-grade tear of the right hamstring tendon and low-grade partial-thickness tear of the labrum.  We will discuss this in more detail at your follow-up appointment on the 22nd.

## 2019-07-29 NOTE — Progress Notes (Signed)
MRI lumbar spine as multiple areas were nurse could get pinched in areas of facet arthritis causing pain in the leg and pain in the back.  We will discuss this in more detail on follow-up on the 22nd.

## 2019-07-30 ENCOUNTER — Ambulatory Visit (INDEPENDENT_AMBULATORY_CARE_PROVIDER_SITE_OTHER): Payer: BC Managed Care – PPO | Admitting: Family Medicine

## 2019-07-30 ENCOUNTER — Encounter: Payer: Self-pay | Admitting: Family Medicine

## 2019-07-30 ENCOUNTER — Other Ambulatory Visit: Payer: Self-pay

## 2019-07-30 VITALS — BP 122/86 | HR 86 | Ht 66.0 in | Wt 173.0 lb

## 2019-07-30 DIAGNOSIS — M47816 Spondylosis without myelopathy or radiculopathy, lumbar region: Secondary | ICD-10-CM

## 2019-07-30 DIAGNOSIS — G8929 Other chronic pain: Secondary | ICD-10-CM

## 2019-07-30 DIAGNOSIS — M7062 Trochanteric bursitis, left hip: Secondary | ICD-10-CM | POA: Diagnosis not present

## 2019-07-30 DIAGNOSIS — M76899 Other specified enthesopathies of unspecified lower limb, excluding foot: Secondary | ICD-10-CM | POA: Diagnosis not present

## 2019-07-30 DIAGNOSIS — M533 Sacrococcygeal disorders, not elsewhere classified: Secondary | ICD-10-CM

## 2019-07-30 NOTE — Patient Instructions (Addendum)
Thank you for coming in today. Plan to add tylenol arthritis.   Plan for referral to [facility] pain doctor to discuss the ablation of the SI joint.   We will also proceed to PRP for the hip. Schedule that at the front desk.  You need to be off aspirin for 1 week.  It is going to be sore for 1-2 weeks after the injection.   I will also research PRP and SI joint.

## 2019-07-30 NOTE — Progress Notes (Signed)
Wynema Birch, am serving as a Neurosurgeon for Dr. [name].  Samantha Cox is a 64 y.o. female who presents to [facility] at [facility] today for f/u of L-sided low back pain and R hip pain and for L-spine and R hip MRI review.  She was last seen by Dr. [name] on 07/25/19 w/ worsening L low back pain and R hip pain.  She has completed 14 PT visits.  She has had multiple lumbar epidurals and most recently had B L4-5 facet injections on 03/14/19.  She had an L-spine and R hip MRI on 07/27/19 and is here to review those results today. Since last visit states that she is feeling worse. Back is feeling better but hip is worse. Pain is more intense when sitting for long periods of time.   She notes left low back SI joint pain.  In April she had left SI joint injection which provided immediate fantastic results which only lasted for a week or 2.  She is resource ablation and is willing to consider it but very worried about potential risks and side effects.  She notes her hip pain is at the greater trochanter region but more predominantly at ischial tuberosity.  This pain is quite prominent.   Her pain in her lumbar spine region is somewhat diminished.  She thinks her SI joint is much more painful than her lumbar spine area.  Diagnostic imaging: L-spine and R hip MRI- 07/27/19; L-spine XR- 04/12/19; R and L hip XR- 01/31/19  Pertinent review of systems: No fevers or chills  Relevant historical information: COPD   Exam:  BP 122/86 (BP Location: Left Arm, Patient Position: Sitting, Cuff Size: Normal)   Pulse 86   Ht 5\' 6"  (1.676 m)   Wt 173 lb (78.5 kg)   SpO2 96%   BMI 27.92 kg/m  General: Well Developed, well nourished, and in no acute distress.   MSK: L-spine normal-appearing decreased motion tender palpation left SI joint. Right hip normal-appearing normal motion.  Tender palpation greater trochanter and ischial tuberosity.  Pain more prominent ischial tuberosity. Mild  antalgic gait.    Lab and Radiology Results No results found for this or any previous visit (from the past 72 hour(s)). MR Lumbar Spine Wo Contrast  Result Date: 07/28/2019 CLINICAL DATA:  Low back pain.  Right lower extremity radiculopathy. EXAM: MRI LUMBAR SPINE WITHOUT CONTRAST TECHNIQUE: Multiplanar, multisequence MR imaging of the lumbar spine was performed. No intravenous contrast was administered. COMPARISON:  Lumbar spine radiographs 04/12/2019. MRI lumbar spine 10/03/2016 FINDINGS: Segmentation: 5 non rib-bearing lumbar type vertebral bodies are present. The lowest fully formed vertebral body is L5. Alignment: Slight retrolisthesis at L1-2 and L2-3 is new. Grade 1 anterolisthesis at L4-5 is stable. Rightward curvature is centered at L3-4. Vertebrae: Edematous endplate marrow changes the progressed on the left at L3-4. Marrow signal and vertebral body heights are otherwise normal. Conus medullaris and cauda equina: Conus extends to the L1 level. Conus and cauda equina appear normal. Paraspinal and other soft tissues: Limited imaging the abdomen is unremarkable. There is no significant adenopathy. No solid organ lesions are present. Disc levels: T12-L1: Negative. L1-2: Mild disc bulging and facet hypertrophy is present. No significant stenosis is present. L2-3: A progressive leftward disc protrusion is present. Mild left subarticular and foraminal stenosis is new. L3-4: A broad-based disc protrusion extends into the left neural foramen. Progressive left facet hypertrophy is noted. Moderate left and mild right foraminal narrowing have progressed.  Moderate left subarticular stenosis is noted. L4-5: A broad-based disc protrusion and advanced facet hypertrophy have progressed bilaterally. Progressive moderate right subarticular and foraminal narrowing is noted. Mild left foraminal narrowing is stable. L5-S1: Progressive moderate facet hypertrophy is present. A shallow central disc protrusion is present.  No significant stenosis is present. IMPRESSION: 1. Progressive multilevel spondylosis of the lumbar spine as described. 2. Mild left subarticular and foraminal stenosis at L2-3 is new. 3. Moderate left subarticular and foraminal and mild right foraminal narrowing at L3-4 have progressed. 4. Progressive moderate right subarticular and foraminal narrowing at L4-5. This is the most significant right-sided disease. 5. Mild left foraminal narrowing at L4-5 is stable. 6. Progressive moderate facet hypertrophy at L5-S1 without significant stenosis. Electronically Signed   By: [name] M.D.   On: 07/28/2019 14:43   MR HIP RIGHT WO CONTRAST  Result Date: 07/28/2019 CLINICAL DATA:  Chronic hip pain.  Possible trochanteric bursitis. EXAM: MR OF THE RIGHT HIP WITHOUT CONTRAST TECHNIQUE: Multiplanar, multisequence MR imaging was performed. No intravenous contrast was administered. COMPARISON:  None. FINDINGS: Bones: No acute fracture. No dislocation. No femoral head avascular necrosis. No focal bone marrow edema. No suspicious bone lesion. Moderate arthropathy of the pubic symphysis. Minimal arthropathy of the bilateral SI joints without evidence of sacroiliitis. Partially visualized lower lumbar spondylosis, see dedicated same day lumbar spine MRI. Articular cartilage and labrum Articular cartilage: No chondral defect. No subchondral marrow signal changes. Labrum: Suspect low-grade partial thickness superior labral tear (series 7, image 9), suboptimally evaluated on non arthrographic imaging. Joint or bursal effusion Joint effusion:  No hip joint effusion. Bursae: Mild right peritrochanteric bursal edema without focal bursal fluid collection. Muscles and tendons Muscles and tendons: Mild tendinosis of the distal gluteus medius tendon (series 7, image 7) without evidence of tear. Tendinosis with low-grade tear at the right hamstring tendon origin (series 5, image 17). Remaining tendinous structures about the hip  are intact. Other findings Miscellaneous:   Colonic diverticulosis. IMPRESSION: 1. No acute osseous abnormality of the right hip. 2. Mild tendinosis of the gluteus medius tendon with associated mild right peritrochanteric bursal edema. 3. Tendinosis with low-grade tear at the right hamstring tendon origin. 4. Suspect low-grade partial thickness superior labral tear. 5. Colonic diverticulosis. Electronically Signed   By: Duanne Guess D.O.   On: 07/28/2019 18:36   I, [name], personally (independently) visualized and performed the interpretation of the images attached in this note.     Assessment and Plan: 64 y.o. female with multifactorial pain  SI joint pain: Predominant pain.  Patient had SI joint injection on the left in April 2021.  She notes this injection was so effective it was " life-changing".  However it did not provide lasting benefit.  Based on this I think she is a good candidate for ablation as she continues to experience daily pain that interferes with her quality of life.  She is done some research on facet ablations and is worried about side effects for facet ablations or SI joint ablation.  I think is worth it is talking to [facility] about risks and benefits of ablation.  She wonders if PRP injection and SI joint will be helpful.  I stated that I will think there is limited evidence for that but I am willing to consider in the future especially if she is unwilling to proceed with ablation or its not effective.  Low back pain: Based on her MRI she certainly has lots of reasons to hurt.  Fortunately she is not having radicular pain nor having a lot of what I think is facet agenic pain.  She does have moderate facet hypertrophy at L5-S1 which would be an obvious target to proceed with facet injections if needed in the future.  At this point we will hold off on these injections.  Pain at right ischial tuberosity due to tendinosis.  Apparently she has a small tear there.  It is possible  that MRI is showing inflammation irritation following injection more than true tear.  Regardless she has failed physical therapy and would benefit from further intervention.  Discussed options and decided that we will proceed with PRP injection.  Right trochanteric bursitis: Seen on MRI.  Again potential target for PRP.  We will attack the most painful area first and will proceed with further injections in the future if needed.  Continue with PT exercises.    Orders Placed This Encounter  Procedures  . Ambulatory referral to Physical Medicine Rehab    Referral Priority:   Routine    Referral Type:   Rehabilitation    Referral Reason:   Specialty Services Required    Referred to Provider:   Charlett Blake, MD    Requested Specialty:   Physical Medicine and Rehabilitation    Number of Visits Requested:   1   No orders of the defined types were placed in this encounter.   Discussed warning signs or symptoms. Please see discharge instructions. Patient expresses understanding.   The above documentation has been reviewed and is accurate and complete Lynne Leader, M.D.  Total encounter time 30 minutes including charting time date of service. Discussing MRI results and plan and options

## 2019-07-31 ENCOUNTER — Ambulatory Visit: Payer: BC Managed Care – PPO | Admitting: Physical Therapy

## 2019-07-31 DIAGNOSIS — M76899 Other specified enthesopathies of unspecified lower limb, excluding foot: Secondary | ICD-10-CM | POA: Insufficient documentation

## 2019-07-31 DIAGNOSIS — M533 Sacrococcygeal disorders, not elsewhere classified: Secondary | ICD-10-CM | POA: Insufficient documentation

## 2019-07-31 DIAGNOSIS — G8929 Other chronic pain: Secondary | ICD-10-CM | POA: Insufficient documentation

## 2019-08-01 ENCOUNTER — Ambulatory Visit: Payer: BC Managed Care – PPO | Attending: Family Medicine | Admitting: Physical Therapy

## 2019-08-01 ENCOUNTER — Encounter: Payer: Self-pay | Admitting: Physical Therapy

## 2019-08-01 ENCOUNTER — Other Ambulatory Visit: Payer: Self-pay

## 2019-08-01 DIAGNOSIS — M25512 Pain in left shoulder: Secondary | ICD-10-CM | POA: Insufficient documentation

## 2019-08-01 DIAGNOSIS — M542 Cervicalgia: Secondary | ICD-10-CM | POA: Diagnosis not present

## 2019-08-01 DIAGNOSIS — M25511 Pain in right shoulder: Secondary | ICD-10-CM | POA: Diagnosis not present

## 2019-08-01 DIAGNOSIS — M546 Pain in thoracic spine: Secondary | ICD-10-CM

## 2019-08-01 DIAGNOSIS — G8929 Other chronic pain: Secondary | ICD-10-CM | POA: Insufficient documentation

## 2019-08-01 DIAGNOSIS — M545 Low back pain: Secondary | ICD-10-CM | POA: Diagnosis not present

## 2019-08-01 NOTE — Therapy (Signed)
[facility] [address] [place], [place], [address] Phone: [phone]   Fax:  [phone]  Physical Therapy Treatment/Recertification  Patient Details  Name: Samantha Cox MRN: [id number] Date of Birth: 05/08/1955 Referring Provider (PT): Evan Corey, MD   Encounter Date: 08/01/2019   PT End of Session - 08/01/19 1553    Visit Number 15    Number of Visits 27    Date for PT Re-Evaluation 09/12/19    Authorization Type 30 VL    PT Start Time 1445    PT Stop Time 1545   50 min direct tx. time   PT Time Calculation (min) 60 min    Activity Tolerance Patient tolerated treatment well    Behavior During Therapy WFL for tasks assessed/performed           Past Medical History:  Diagnosis Date  . History of tobacco use   . Routine general medical examination at a health care facility   . Screening for lipoid disorders     Past Surgical History:  Procedure Laterality Date  . OTHER SURGICAL HISTORY     Birthmark removed from forehead 7th grade    There were no vitals filed for this visit.   Subjective Assessment - 08/01/19 1448    Subjective Pt. returns to therapy, not seen since 06/28/19 due to factors including travel out of state. She had MRI for her lumbar spine and right hip with findings for hip of abductor and hamstring tendinosis and small partial tear in proximal hamstring. For lumbar spine imaging showed diffuse degenerative changes and facet arthropathy with some disc bulges-see chart copy of report. Primary pain today in right ischial tuberosty region. She has been referred to Dr. Kirsteins for possible ablation for SI pain and reports some concern over possible side effects but will discuss with MD.    Pertinent History X-rays positive for lumbar dextro-scoliosis and cervical DDD    Patient Stated Goals [place] states that she would like to reduce pain, especially on the left side    Currently in Pain? Yes     Pain Score --   8-9/10   Pain Location Hip    Pain Orientation Right;Posterior;Lateral    Pain Descriptors / Indicators Constant    Pain Type Chronic pain    Pain Onset More than a month ago    Pain Frequency Constant    Aggravating Factors  sitting    Pain Relieving Factors better with movement for hip but this makes low back worse    Effect of Pain on Daily Activities limits positional tolerance              [facility] PT Assessment - 08/01/19 0001      Strength   Right Hip Flexion 5/5    Right Hip Extension 4+/5    Right Hip External Rotation  4+/5    Right Hip Internal Rotation 5/5    Right Hip ABduction 4+/5    Right Hip ADduction 5/5    Left Hip Flexion 5/5    Left Hip Extension 5/5    Left Hip External Rotation 5/5    Left Hip Internal Rotation 5/5    Left Hip ABduction 4+/5    Left Hip ADduction 5/5                         [facility] Adult PT Treatment/Exercise - 08/01/19 0001      Exercises   Exercises Knee/Hip        Lumbar Exercises: Aerobic   Nustep L5 x 5 min UE/LE      Knee/Hip Exercises: Standing   Hip Abduction AROM;Stengthening;Right;Left;2 sets;10 reps    Abduction Limitations eccentric emphasis with green Theraband    Hip Extension AROM;Stengthening;Right;Left;2 sets;10 reps    Extension Limitations eccentric emphasis with green Theraband      Knee/Hip Exercises: Seated   Other Seated Knee/Hip Exercises hamstring curl eccentric with green band 2x10 right side      Knee/Hip Exercises: Prone   Hamstring Curl Limitations eccentric hamstring curl 2x10 on right with 3 lb. ankle weight      Modalities   Modalities Iontophoresis;Ultrasound      Ultrasound   Ultrasound Location right ischial tuberosity region    Ultrasound Parameters 1 MHZ 100% 1.0 W/cm2    Ultrasound Goals Pain      Iontophoresis   Type of Iontophoresis Dexamethasone    Location right ischial tuberosity region    Dose 4 mg/ml   1 ml   Time --   4 hour take home patch                   PT Education - 08/01/19 1552    Education Details symptom etiology for hip pain given MRI findings, HEP, iontophoresis-monitor skin after pathch removal    Person(s) Educated Patient    Methods Explanation;Demonstration;Verbal cues    Comprehension Verbalized understanding;Returned demonstration            PT Short Term Goals - 08/01/19 1601      PT SHORT TERM GOAL #1   Title Pt will be able to complete HEPs with at worst pain of 6/10    Baseline continue goal/update for right hip pain today rated 8-9/10    Time 3    Period Weeks    Status New    Target Date 08/22/19      PT SHORT TERM GOAL #2   Title Pt. will be able to demonstrate and verbalize proper posture    Baseline still some tendency forward head/slouched posture but corrects when aware    Time 3    Period Weeks    Status On-going    Target Date 08/22/19             PT Long Term Goals - 08/01/19 1602      PT LONG TERM GOAL #1   Title Patient will achieve a cervical rotation bilaterally of </= 70 degrees    Baseline continue goal, tx. focus on hip region so neck not assessed today, previous goal of 60 deg met    Time 6    Period Weeks    Status On-going    Target Date 09/12/19      PT LONG TERM GOAL #2   Title Patient will report an at worst pain 3/10    Baseline hip pain 8-9/10    Time 6    Period Weeks    Status On-going    Target Date 09/12/19      PT LONG TERM GOAL #3   Title Tolerate sitting for car travel and eating meals periods at least 30-40 min with hip pain 4/10 or less    Baseline 8-9/10    Time 6    Period Weeks    Status New    Target Date 09/12/19      PT LONG TERM GOAL #4   Title Independent with long term HEP for continued care after d/c    Baseline will  progress and establish as appropriate    Time 6    Period Weeks    Status On-going    Target Date 09/12/19      PT LONG TERM GOAL #5   Title Right hip abduction and extension strength 5/5 to improve  ability for lifting activities    Baseline 4+/5    Time 6    Period Weeks    Status New    Target Date 09/12/19                 Plan - 08/01/19 1555    Clinical Impression Statement Pt. returns with primary c/o of right hip pain most notable around ischial tuberosity and consistent with MRI findings of tendinosis, chronic low grade tear. Added modalities to this region with US and iontophoresis trial today and for exercises added/focused on eccentrics for hip abductors and hamstrings. Plan resume PT with focus on hip region for further potential benefit.    Personal Factors and Comorbidities Age;Behavior Pattern;Profession    Examination-Activity Limitations Other;Carry;Continence;Sit;Sleep;Squat;Stand    Examination-Participation Restrictions Other    Stability/Clinical Decision Making Evolving/Moderate complexity    Clinical Decision Making Moderate    Rehab Potential Fair    PT Frequency --   1-2x/week   PT Duration 6 weeks    PT Treatment/Interventions Moist Heat;Therapeutic activities;Therapeutic exercise;Balance training;Neuromuscular re-education;Manual techniques;Passive range of motion;Joint Manipulations;Iontophoresis 4mg/ml Dexamethasone;ADLs/Self Care Home Management;Cryotherapy;Electrical Stimulation;Traction;Ultrasound;Gait training;Patient/family education;Taping;Vestibular;Spinal Manipulations    PT Next Visit Plan check response ionto, continue US ischial tuberosity region, eccentrics, manual and strengthening/stretches as needed    PT Home Exercise Plan updated 08/01/19 with hip abduction with Theraband, hamstring eccentrics in prone vs. sitting, previous HEP:chin tucks, cervical rotation snag, qped thoracic rotation, HS stretching; AHSS, piriformis stretch, QL stretch (left),  pelvic tilt, bridge & dead bug with LE ext rot., SKTC vs. seated trunk flexion stretch, pall off press, Theraband row and ext, front squat with KB vs. DB    Consulted and Agree with Plan of Care  Patient           Patient will benefit from skilled therapeutic intervention in order to improve the following deficits and impairments:  Decreased range of motion, Dizziness, Pain, Decreased balance, Hypomobility, Decreased strength, Decreased mobility, Increased muscle spasms, Improper body mechanics, Postural dysfunction, Impaired flexibility, Decreased activity tolerance  Visit Diagnosis: Cervicalgia  Chronic bilateral low back pain without sciatica  Chronic left shoulder pain  Chronic right shoulder pain  Pain in thoracic spine     Problem List Patient Active Problem List   Diagnosis Date Noted  . Chronic left SI joint pain 07/31/2019  . Hamstring tendinitis at origin 07/31/2019  . Facet arthritis of lumbar region 03/05/2019  . Chronic rhinitis 12/19/2018  . Shortness of breath 12/19/2018  . Ex-cigarette smoker 12/19/2018  . Greater trochanteric bursitis of right hip 07/10/2018  . Degenerative lumbar spinal stenosis 10/17/2016  . Leg cramping 09/19/2016  . Morton's neuroma of left foot 05/24/2016  . Greater trochanteric bursitis of left hip 11/25/2014  . Degenerative arthritis of hip 01/16/2014  . Acetabular labrum tear 01/16/2014    Christopher Zoch, PT, DPT 08/01/19 4:06 PM  [facility] [address] [place], [place], [address] Phone: [phone]   Fax:  [phone]  Name: Hanna B Caver MRN: [id number] Date of Birth: 10/19/1955   

## 2019-08-05 ENCOUNTER — Other Ambulatory Visit: Payer: Self-pay

## 2019-08-05 ENCOUNTER — Encounter: Payer: Self-pay | Admitting: Physical Medicine & Rehabilitation

## 2019-08-05 ENCOUNTER — Encounter: Payer: Self-pay | Admitting: Physical Therapy

## 2019-08-05 ENCOUNTER — Ambulatory Visit: Payer: BC Managed Care – PPO | Admitting: Physical Therapy

## 2019-08-05 DIAGNOSIS — M546 Pain in thoracic spine: Secondary | ICD-10-CM | POA: Diagnosis not present

## 2019-08-05 DIAGNOSIS — M25512 Pain in left shoulder: Secondary | ICD-10-CM

## 2019-08-05 DIAGNOSIS — M25511 Pain in right shoulder: Secondary | ICD-10-CM | POA: Diagnosis not present

## 2019-08-05 DIAGNOSIS — M545 Low back pain, unspecified: Secondary | ICD-10-CM

## 2019-08-05 DIAGNOSIS — G8929 Other chronic pain: Secondary | ICD-10-CM | POA: Diagnosis not present

## 2019-08-05 DIAGNOSIS — M542 Cervicalgia: Secondary | ICD-10-CM

## 2019-08-05 NOTE — Therapy (Signed)
[facility] [address] [place], [place], [address] Phone: [phone]   Fax:  [phone]  Physical Therapy Treatment  Patient Details  Name: Samantha Cox MRN: [id number] Date of Birth: 09/02/1955 Referring Provider (PT): [name], MD   Encounter Date: 08/05/2019   PT End of Session - 08/05/19 1809    Visit Number 16    Number of Visits 27    Date for PT Re-Evaluation 09/12/19    Authorization Type 30 VL    PT Start Time 1548    PT Stop Time 1631    PT Time Calculation (min) 43 min    Activity Tolerance Patient tolerated treatment well    Behavior During Therapy [facility] for tasks assessed/performed           Past Medical History:  Diagnosis Date  . History of tobacco use   . Routine general medical examination at a health care facility   . Screening for lipoid disorders     Past Surgical History:  Procedure Laterality Date  . OTHER SURGICAL HISTORY     Birthmark removed from forehead 7th grade    There were no vitals filed for this visit.   Subjective Assessment - 08/05/19 1804    Subjective Pt. reports had some improvement in ichial tuberosity region pain on right side after last session-uncertain if relief was due to ionto patch vs. soreness from exacerbating activities resolving.                             [facility] Adult PT Treatment/Exercise - 08/05/19 0001      Lumbar Exercises: Supine   Other Supine Lumbar Exercises brief HEP review SKTC vs. DKTC, deadbugs, bridge and clamshell      Knee/Hip Exercises: Prone   Hamstring Curl Limitations eccentric hamstring curl 2x10 on right with 5 lb. ankle weight, also practiced another set x 10 with blue Theraband for HEP variation      Ultrasound   Ultrasound Location right ischial tuberosity region     Ultrasound Parameters 1 MHZ 100% 1.0 W/cm2    Ultrasound Goals Pain      Iontophoresis   Type of Iontophoresis Dexamethasone    Location  right ischial tuberosity region    Dose 4 mg/ml   1 ml   Time --   4 hour take home patch     Manual Therapy   Soft tissue mobilization STM/IASTM with foam roll right hamstring, glut/piriformis, right piriformis release with passive hip ER                  PT Education - 08/05/19 1808    Education Details HEP review    Person(s) Educated Patient    Methods Explanation;Demonstration;Verbal cues;Handout    Comprehension Verbalized understanding;Returned demonstration            PT Short Term Goals - 08/01/19 1601      PT SHORT TERM GOAL #1   Title Pt will be able to complete HEPs with at worst pain of 6/10    Baseline continue goal/update for right hip pain today rated 8-9/10    Time 3    Period Weeks    Status New    Target Date 08/22/19      PT SHORT TERM GOAL #2   Title Pt. will be able to demonstrate and verbalize proper posture    Baseline still some tendency forward head/slouched posture but corrects  when aware    Time 3    Period Weeks    Status On-going    Target Date 08/22/19             PT Long Term Goals - 08/01/19 1602      PT LONG TERM GOAL #1   Title Patient will achieve a cervical rotation bilaterally of </= 70 degrees    Baseline continue goal, tx. focus on hip region so neck not assessed today, previous goal of 60 deg met    Time 6    Period Weeks    Status On-going    Target Date 09/12/19      PT LONG TERM GOAL #2   Title Patient will report an at worst pain 3/10    Baseline hip pain 8-9/10    Time 6    Period Weeks    Status On-going    Target Date 09/12/19      PT LONG TERM GOAL #3   Title Tolerate sitting for car travel and eating meals periods at least 30-40 min with hip pain 4/10 or less    Baseline 8-9/10    Time 6    Period Weeks    Status New    Target Date 09/12/19      PT LONG TERM GOAL #4   Title Independent with long term HEP for continued care after d/c    Baseline will progress and establish as appropriate     Time 6    Period Weeks    Status On-going    Target Date 09/12/19      PT LONG TERM GOAL #5   Title Right hip abduction and extension strength 5/5 to improve ability for lifting activities    Baseline 4+/5    Time 6    Period Weeks    Status New    Target Date 09/12/19                 Plan - 08/05/19 1850    Clinical Impression Statement Improved right hip/proximal hamstring pain from status last visit though as noted by pt. some difficulty assessing how much of relief from treatment vs. soreness easing from activity. Given improvement continued similar tx. with [place], ionto. Reviewed HEP for eccentrics for hamstring, abductors and also per pt. request brief review lumbar exercises for HEP. Progress re: LTGs ongoing.    Personal Factors and Comorbidities Age;Behavior Pattern;Profession    Examination-Activity Limitations Other;Carry;Continence;Sit;Sleep;Squat;Stand    Examination-Participation Restrictions Other    Stability/Clinical Decision Making Evolving/Moderate complexity    Clinical Decision Making Moderate    Rehab Potential Fair    PT Frequency --   1-2x/week   PT Duration 6 weeks    PT Treatment/Interventions Moist Heat;Therapeutic activities;Therapeutic exercise;Balance training;Neuromuscular re-education;Manual techniques;Passive range of motion;Joint Manipulations;Iontophoresis '4mg'$ /ml Dexamethasone;ADLs/Self Care Home Management;Cryotherapy;Electrical Stimulation;Traction;Ultrasound;Gait training;Patient/family education;Taping;Vestibular;Spinal Manipulations    PT Next Visit Plan continue ionto, continue [place] ischial tuberosity region, eccentrics, manual and strengthening/stretches as needed    PT Home Exercise Plan updated 08/01/19 with hip abduction with Theraband, hamstring eccentrics in prone vs. sitting, previous EGB:TDVV tucks, cervical rotation snag, qped thoracic rotation, HS stretching; AHSS, piriformis stretch, QL stretch (left),  pelvic tilt, bridge & dead bug  with LE ext rot., SKTC vs. seated trunk flexion stretch, pall off press, Theraband row and ext, front squat with KB vs. DB    Consulted and Agree with Plan of Care Patient           Patient will  benefit from skilled therapeutic intervention in order to improve the following deficits and impairments:  Decreased range of motion, Dizziness, Pain, Decreased balance, Hypomobility, Decreased strength, Decreased mobility, Increased muscle spasms, Improper body mechanics, Postural dysfunction, Impaired flexibility, Decreased activity tolerance  Visit Diagnosis: Chronic bilateral low back pain without sciatica  Cervicalgia  Chronic left shoulder pain  Chronic right shoulder pain  Pain in thoracic spine     Problem List Patient Active Problem List   Diagnosis Date Noted  . Chronic left SI joint pain 07/31/2019  . Hamstring tendinitis at origin 07/31/2019  . Facet arthritis of lumbar region 03/05/2019  . Chronic rhinitis 12/19/2018  . Shortness of breath 12/19/2018  . Ex-cigarette smoker 12/19/2018  . Greater trochanteric bursitis of right hip 07/10/2018  . Degenerative lumbar spinal stenosis 10/17/2016  . Leg cramping 09/19/2016  . Morton's neuroma of left foot 05/24/2016  . Greater trochanteric bursitis of left hip 11/25/2014  . Degenerative arthritis of hip 01/16/2014  . Acetabular labrum tear 01/16/2014   Beaulah Dinning, PT, DPT 08/05/19 6:55 PM  [facility] [address] [place], [place], [address] Phone: [phone]   Fax:  [phone]  Name: Samantha Cox MRN: [id number] Date of Birth: April 17, 1955

## 2019-08-07 ENCOUNTER — Ambulatory Visit: Payer: BC Managed Care – PPO | Admitting: Physical Therapy

## 2019-08-15 ENCOUNTER — Encounter: Payer: Self-pay | Admitting: Physical Therapy

## 2019-08-15 ENCOUNTER — Other Ambulatory Visit: Payer: Self-pay

## 2019-08-15 ENCOUNTER — Ambulatory Visit: Payer: BC Managed Care – PPO | Attending: Family Medicine | Admitting: Physical Therapy

## 2019-08-15 DIAGNOSIS — M545 Low back pain: Secondary | ICD-10-CM | POA: Diagnosis not present

## 2019-08-15 DIAGNOSIS — M25512 Pain in left shoulder: Secondary | ICD-10-CM | POA: Insufficient documentation

## 2019-08-15 DIAGNOSIS — M546 Pain in thoracic spine: Secondary | ICD-10-CM | POA: Diagnosis not present

## 2019-08-15 DIAGNOSIS — M25511 Pain in right shoulder: Secondary | ICD-10-CM | POA: Diagnosis not present

## 2019-08-15 DIAGNOSIS — M542 Cervicalgia: Secondary | ICD-10-CM

## 2019-08-15 DIAGNOSIS — G8929 Other chronic pain: Secondary | ICD-10-CM | POA: Diagnosis not present

## 2019-08-15 NOTE — Therapy (Signed)
[place], [place], [address] Phone: [phone]   Fax:  [phone]  Physical Therapy Treatment  Patient Details  Name: Samantha Cox MRN: [id number] Date of Birth: 06/22/1955 Referring Provider (PT): Lynne Leader, MD   Encounter Date: 08/15/2019   PT End of Session - 08/15/19 2038    Visit Number 17    Number of Visits 27    Date for PT Re-Evaluation 09/12/19    Authorization Type 30 VL    PT Start Time 1640   pt. arrived late   PT Stop Time 1715    PT Time Calculation (min) 35 min    Activity Tolerance Patient tolerated treatment well    Behavior During Therapy [facility] for tasks assessed/performed           Past Medical History:  Diagnosis Date  . History of tobacco use   . Routine general medical examination at a health care facility   . Screening for lipoid disorders     Past Surgical History:  Procedure Laterality Date  . OTHER SURGICAL HISTORY     Birthmark removed from forehead 7th grade    There were no vitals filed for this visit.   Subjective Assessment - 08/15/19 2033    Subjective Pt. missed therapy visit last week due to clinic cancellation from therapist being out and also returns from recent travel. Primary issue remains right ischial tuberosity region pain which is about the same. She has initial visit with Dr. Letta Pate tomorrow for pain management. Limited HEP performance during travel.                             [facility] Adult PT Treatment/Exercise - 08/15/19 0001      Knee/Hip Exercises: Supine   Other Supine Knee/Hip Exercises brief HEP review options for hamstring eccentric, resisted hip abduction, gentle hamstring stretch      Ultrasound   Ultrasound Location right ischial tuberosity    Ultrasound Parameters 1 MHZ 100% 1.0 W/cm2    Ultrasound Goals Pain      Iontophoresis   Type of Iontophoresis Dexamethasone    Location right ischial tuberosity  region    Dose 4 mg/ml   1 ml   Time --   4 hour take home patch     Manual Therapy   Soft tissue mobilization STM/IASTM right hamstring, piriformis region, right piriformis release with passive hip ER                  PT Education - 08/15/19 2038    Education Details HEP review, POC    Person(s) Educated Patient    Methods Explanation    Comprehension Verbalized understanding            PT Short Term Goals - 08/01/19 1601      PT SHORT TERM GOAL #1   Title Pt will be able to complete HEPs with at worst pain of 6/10    Baseline continue goal/update for right hip pain today rated 8-9/10    Time 3    Period Weeks    Status New    Target Date 08/22/19      PT SHORT TERM GOAL #2   Title Pt. will be able to demonstrate and verbalize proper posture    Baseline still some tendency forward head/slouched posture but corrects when aware    Time 3    Period Weeks  Status On-going    Target Date 08/22/19             PT Long Term Goals - 08/01/19 1602      PT LONG TERM GOAL #1   Title Patient will achieve a cervical rotation bilaterally of </= 70 degrees    Baseline continue goal, tx. focus on hip region so neck not assessed today, previous goal of 60 deg met    Time 6    Period Weeks    Status On-going    Target Date 09/12/19      PT LONG TERM GOAL #2   Title Patient will report an at worst pain 3/10    Baseline hip pain 8-9/10    Time 6    Period Weeks    Status On-going    Target Date 09/12/19      PT LONG TERM GOAL #3   Title Tolerate sitting for car travel and eating meals periods at least 30-40 min with hip pain 4/10 or less    Baseline 8-9/10    Time 6    Period Weeks    Status New    Target Date 09/12/19      PT LONG TERM GOAL #4   Title Independent with long term HEP for continued care after d/c    Baseline will progress and establish as appropriate    Time 6    Period Weeks    Status On-going    Target Date 09/12/19      PT LONG TERM  GOAL #5   Title Right hip abduction and extension strength 5/5 to improve ability for lifting activities    Baseline 4+/5    Time 6    Period Weeks    Status New    Target Date 09/12/19                 Plan - 08/15/19 2041    Clinical Impression Statement Limited change since last visit but also limited recent attendance and limited HEP performance with recent travel. Plan try at least a couple more visits to see if benefit can be obtained for hamstring/hip region pain and with status also pending POC from pain management MD visit tomorrow.    Personal Factors and Comorbidities Age;Behavior Pattern;Profession    Examination-Activity Limitations Other;Carry;Continence;Sit;Sleep;Squat;Stand    Examination-Participation Restrictions Other    Stability/Clinical Decision Making Evolving/Moderate complexity    Clinical Decision Making Moderate    Rehab Potential Good    PT Frequency --   1-2x/week   PT Duration 6 weeks    PT Treatment/Interventions Moist Heat;Therapeutic activities;Therapeutic exercise;Balance training;Neuromuscular re-education;Manual techniques;Passive range of motion;Joint Manipulations;Iontophoresis '4mg'$ /ml Dexamethasone;ADLs/Self Care Home Management;Cryotherapy;Electrical Stimulation;Traction;Ultrasound;Gait training;Patient/family education;Taping;Vestibular;Spinal Manipulations    PT Next Visit Plan continue ionto, continue [place] ischial tuberosity region, eccentrics, manual and strengthening/stretches as needed    PT Home Exercise Plan updated 08/01/19 with hip abduction with Theraband, hamstring eccentrics in prone vs. sitting, previous QPY:PPJK tucks, cervical rotation snag, qped thoracic rotation, HS stretching; AHSS, piriformis stretch, QL stretch (left),  pelvic tilt, bridge & dead bug with LE ext rot., SKTC vs. seated trunk flexion stretch, pall off press, Theraband row and ext, front squat with KB vs. DB    Consulted and Agree with Plan of Care Patient            Patient will benefit from skilled therapeutic intervention in order to improve the following deficits and impairments:  Decreased range of motion, Dizziness, Pain, Decreased balance, Hypomobility, Decreased  strength, Decreased mobility, Increased muscle spasms, Improper body mechanics, Postural dysfunction, Impaired flexibility, Decreased activity tolerance  Visit Diagnosis: Chronic bilateral low back pain without sciatica  Cervicalgia  Chronic left shoulder pain  Chronic right shoulder pain  Pain in thoracic spine     Problem List Patient Active Problem List   Diagnosis Date Noted  . Chronic left SI joint pain 07/31/2019  . Hamstring tendinitis at origin 07/31/2019  . Facet arthritis of lumbar region 03/05/2019  . Chronic rhinitis 12/19/2018  . Shortness of breath 12/19/2018  . Ex-cigarette smoker 12/19/2018  . Greater trochanteric bursitis of right hip 07/10/2018  . Degenerative lumbar spinal stenosis 10/17/2016  . Leg cramping 09/19/2016  . Morton's neuroma of left foot 05/24/2016  . Greater trochanteric bursitis of left hip 11/25/2014  . Degenerative arthritis of hip 01/16/2014  . Acetabular labrum tear 01/16/2014    Beaulah Dinning, PT, DPT 08/15/19 8:46 PM  [facility] [address] [place], [place], [address] Phone: [phone]   Fax:  [phone]  Name: Samantha Cox MRN: [id number] Date of Birth: Jul 05, 1955

## 2019-08-16 ENCOUNTER — Ambulatory Visit: Payer: BC Managed Care – PPO | Admitting: Family Medicine

## 2019-08-16 ENCOUNTER — Encounter: Payer: Self-pay | Admitting: Physical Medicine & Rehabilitation

## 2019-08-16 ENCOUNTER — Other Ambulatory Visit: Payer: Self-pay

## 2019-08-16 ENCOUNTER — Encounter
Payer: BC Managed Care – PPO | Attending: Physical Medicine & Rehabilitation | Admitting: Physical Medicine & Rehabilitation

## 2019-08-16 VITALS — Temp 98.1°F | Ht 66.0 in | Wt 168.0 lb

## 2019-08-16 DIAGNOSIS — M76899 Other specified enthesopathies of unspecified lower limb, excluding foot: Secondary | ICD-10-CM

## 2019-08-16 DIAGNOSIS — M533 Sacrococcygeal disorders, not elsewhere classified: Secondary | ICD-10-CM

## 2019-08-16 DIAGNOSIS — G8929 Other chronic pain: Secondary | ICD-10-CM

## 2019-08-16 NOTE — Progress Notes (Signed)
Subjective:    Patient ID: Samantha Cox, female    DOB: 10/16/1955, 64 y.o.   MRN: [id number]  HPI   Referral for Left sacroiliac nerve block possible ablation  64 year old female who was referred by her sports medicine physician Dr. [name] for interventional pain evaluation. The patient has a long history of back pain, buttock pain and hip pain.  Ultrasound-guided left sacroiliac injection using Kenalog and Marcaine provided excellent short-term pain relief.  Patient states that her arm most troublesome pain at the current time is in her right hip and hamstring area, this is followed by left-sided low back and buttock pain.  Recent MRI of the hip was reviewed, tendinosis of the right hamstring origin as well as tendinosis of the distal gluteus medius.  Report also indicated possible labral tear on the right but no groin pain on that side. MRI lumbar spine that same day demonstrated significant facet hypertrophy L4-L5 but no significant foraminal or central narrowing at that level.  There is also facet hypertrophy at L3-4 as well as L5-S1.  At L3-L4 there is also some moderate foraminal stenosis. The patient has undergone L5-S1 translaminar injections performed at [facility] radiology on 3 occasions no significant relief.  She has not had any significant pain radiating from the back to below the knee. The patient also reportedly had bilateral L4-L5 facet injections performed on 03/14/2019 although I do not see a note in epic. Pain Inventory Average Pain 5 Pain Right Now 3 My pain is sharp, burning, dull, stabbing and aching  In the last 24 hours, has pain interfered with the following? General activity 6 Relation with others 5 Enjoyment of life 6 What TIME of day is your pain at its worst? varies Sleep (in general) Fair  Pain is worse with: walking, bending, sitting and standing Pain improves with: medication and injections Relief from Meds: 5  Mobility walk without  assistance ability to climb steps?  yes do you drive?  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 Hypertension Mother   . Asthma Mother   . Atrial fibrillation Father   . CVA Father 68  . Bladder Cancer Father   . Lung cancer Brother   . Diverticulitis Brother    Social History   Socioeconomic History  . Marital status: Married    Spouse name: Not on file  . Number of children: 2  . Years of education: Not on file  . Highest education level: Not on file  Occupational History  . Not on file  Tobacco Use  . Smoking status: Former Smoker    Packs/day: 1.00    Years: 40.00    Pack years: 40.00    Types: Cigarettes    Quit date: 2015    Years since quitting: 6.5  . Smokeless tobacco: Never Used  Vaping Use  . Vaping Use: Every day  Substance and Sexual Activity  . Alcohol use: Yes    Alcohol/week: 1.0 standard drink    Types: 1 Glasses of wine per week    Comment: occ  . Drug use: Never  . Sexual activity: Not on file  Other Topics Concern  . Not on file  Social History Narrative  . Not on file   Social Determinants of Health   Financial Resource Strain:   .  Difficulty of Paying Living Expenses:   Food Insecurity:   . Worried About Programme researcher, broadcasting/film/video in the Last Year:   . Barista in the Last Year:   Transportation Needs:   . Freight forwarder (Medical):   Marland Kitchen Lack of Transportation (Non-Medical):   Physical Activity:   . Days of Exercise per Week:   . Minutes of Exercise per Session:   Stress:   . Feeling of Stress :   Social Connections:   . Frequency of Communication with Friends and Family:   . Frequency of Social Gatherings with Friends and Family:   . Attends Religious Services:   . Active Member of Clubs or Organizations:   . Attends Banker  Meetings:   Marland Kitchen Marital Status:    Past Surgical History:  Procedure Laterality Date  . OTHER SURGICAL HISTORY     Birthmark removed from forehead 7th grade   Past Medical History:  Diagnosis Date  . History of tobacco use   . Routine general medical examination at a health care facility   . Screening for lipoid disorders    Temp 98.1 F (36.7 C)   Ht 5\' 6"  (1.676 m)   Wt 168 lb (76.2 kg)   BMI 27.12 kg/m   Opioid Risk Score:   Fall Risk Score:  `1  Depression screen PHQ 2/9  Depression screen PHQ 2/9 08/16/2019  Decreased Interest 1  Down, Depressed, Hopeless 1  PHQ - 2 Score 2  Altered sleeping 2  Tired, decreased energy 1  Change in appetite 1  Feeling bad or failure about yourself  2  Trouble concentrating 1  Moving slowly or fidgety/restless 0  Suicidal thoughts 0  PHQ-9 Score 9  Difficult doing work/chores Somewhat difficult    Review of Systems  Constitutional: Negative.   HENT: Negative.   Eyes: Negative.   Respiratory: Negative.   Cardiovascular: Negative.   Gastrointestinal: Negative.   Endocrine: Negative.   Genitourinary: Negative.   Musculoskeletal: Positive for arthralgias.  Skin: Negative.   Allergic/Immunologic: Negative.   Neurological: Negative.   Hematological: Negative.   Psychiatric/Behavioral: Negative.   All other systems reviewed and are negative.      Objective:   Physical Exam Vitals and nursing note reviewed.  Constitutional:      Appearance: She is obese.  HENT:     Head: Normocephalic.  Eyes:     Extraocular Movements: Extraocular movements intact.     Conjunctiva/sclera: Conjunctivae normal.     Pupils: Pupils are equal, round, and reactive to light.  Neurological:     General: No focal deficit present.     Mental Status: She is alert and oriented to person, place, and time.     Motor: Motor function is intact.     Comments: Motor strength is 5/5 bilateral deltoid bicep tricep grip hip flexor knee extensor ankle  dorsiflexor Sensation intact bilateral lower extremities L3-L4 L5-S1 dermatome just patient Deep tendon reflexes are 1+ bilateral knees and ankles  Psychiatric:        Mood and Affect: Mood normal.        Behavior: Behavior normal.   Patient with tenderness over the left PSIS area There is no tenderness over the lumbar paraspinal area no pain in the low back with lumbar flexion extension lateral bending or rotation Lumbar range of motion is normal There is pain palpation of the right ischial tuberosity. Minimal Levoconvex scoliosis     Assessment & Plan:  #  1.  Left sacroiliac pain relieved temporarily by intra-articular injection.  Discussed that left L5 dorsal ramus S1-S2-S3 lateral branch blocks would help determine whether innervation of the sacroiliac joint is via posterior rami and therefore accessible to ablation.  We discussed the risks and benefits of the procedure with the patient and she will proceed with this.  Patient will schedule that next available.  2.  Right proximal hamstring tendinosis and avoidance.  Discussed that I have no interventional procedures that I would recommend at this time and recommend that she continue her current physical therapy with emphasis on gluteal strengthening and avoidance of hamstring stretching she has

## 2019-08-16 NOTE — Patient Instructions (Signed)
Plan sacroiliac nerve block and if successful follow with ablation

## 2019-08-31 ENCOUNTER — Other Ambulatory Visit: Payer: Self-pay | Admitting: Family Medicine

## 2019-09-04 ENCOUNTER — Telehealth: Payer: Self-pay

## 2019-09-04 ENCOUNTER — Other Ambulatory Visit: Payer: Self-pay | Admitting: Family Medicine

## 2019-09-04 ENCOUNTER — Telehealth: Payer: Self-pay | Admitting: Family Medicine

## 2019-09-04 ENCOUNTER — Other Ambulatory Visit: Payer: Self-pay | Admitting: Physical Therapy

## 2019-09-04 MED ORDER — METHOCARBAMOL 500 MG PO TABS: ORAL_TABLET | ORAL | 0 refills | Status: DC

## 2019-09-04 NOTE — Telephone Encounter (Signed)
Can you please call Samantha Cox back?  Patient has an appointment with Dr. Wynn Banker on 09/10/2019 for a spinal injection. She has questions about her recovery and travel.   Call back ph [phone]   Thank you very much!

## 2019-09-04 NOTE — Telephone Encounter (Signed)
Pt needs Robaxin refill to [facility]

## 2019-09-04 NOTE — Telephone Encounter (Signed)
Robaxin refilled to [facility].

## 2019-09-06 NOTE — Telephone Encounter (Signed)
Mrs Freestone called to get information on the injection she is scheduled for 09/10/19 (Left L5 dorsal ramus, S1,2,3 lateral branch blocks).  I reviewed the procedure and expectations. She is not on any blood thinners and does not require a sedative. She does not require a driver. Questions asked and answered.

## 2019-09-10 ENCOUNTER — Other Ambulatory Visit: Payer: Self-pay

## 2019-09-10 ENCOUNTER — Encounter: Payer: Self-pay | Admitting: Physical Medicine & Rehabilitation

## 2019-09-10 ENCOUNTER — Encounter
Payer: BC Managed Care – PPO | Attending: Physical Medicine & Rehabilitation | Admitting: Physical Medicine & Rehabilitation

## 2019-09-10 VITALS — BP 114/74 | HR 98 | Temp 97.5°F | Ht 66.0 in | Wt 175.0 lb

## 2019-09-10 DIAGNOSIS — G8929 Other chronic pain: Secondary | ICD-10-CM | POA: Diagnosis not present

## 2019-09-10 DIAGNOSIS — M76899 Other specified enthesopathies of unspecified lower limb, excluding foot: Secondary | ICD-10-CM | POA: Insufficient documentation

## 2019-09-10 DIAGNOSIS — M533 Sacrococcygeal disorders, not elsewhere classified: Secondary | ICD-10-CM | POA: Insufficient documentation

## 2019-09-10 NOTE — Progress Notes (Signed)
  PROCEDURE RECORD [facility]   Name: KYMIA SIMI DOB:[birth year] MRN: [id number]  Date:09/10/2019  Physician: Claudette Laws, MD    Nurse/CMA: Luvenia Cranford, CMA   Allergies:  Allergies  Allergen Reactions  . Augmentin [Amoxicillin-Pot Clavulanate] Diarrhea and Other (See Comments)    Felt very bad      Consent Signed: Yes.    Is patient diabetic? No.  CBG today?   Pregnant: No. LMP: No LMP recorded. Patient is postmenopausal. (age 4-55)  Anticoagulants: no Anti-inflammatory: yes (advil 8am) Antibiotics: no  Procedure: left L5 dorsal ramus S1-3 lateral branch blocks  Position: Prone Start Time: 1:35pm  End Time: 1:44pm  Fluoro Time: 28  RN/CMA Lillyonna Armstead, CMA Myley Bahner, CMA    Time 1:19pm 1:50pm    BP 114/74 14/84    Pulse 95 76    Respirations 14 14    O2 Sat 98 94    S/S 6 6    Pain Level 4/10 0/10     D/C home with self, patient A & O X 3, D/C instructions reviewed, and sits independently.

## 2019-09-10 NOTE — Patient Instructions (Addendum)
Left sacroiliac nerve blocks with lidocaine performed today to assess potential benefit of radiofrequency ablation of the same nerves  Please assess degree of pain relief today.  If 50% or better reduction would recommend radiofrequency neurotomy of the nerves we injected today.  Please call tomorrow to report degree of pain relief

## 2019-09-10 NOTE — Progress Notes (Signed)
L5 dorsal ramus S1-S2-S3 lateral branch blocks under fluoroscopic guidance Left side  Informed consent was obtained after describing risks and benefits of the procedure with patient these include bleeding bruising and infection.  He elects to proceed and has given written consent.  Patient placed prone on fluoroscopy table Betadine prep sterile drape a 25-gauge 1.5 inch needle was used to anesthetize skin and subcu tissue with 1% lidocaine 1 cc into each of 4 sites.  Then a 22-gauge 3.5" needle was inserted under fluoroscopic guidance for starting the S1 SAP sacral ala junction.  Bone contact made.  Isovue 200 x 0.5 mL demonstrated no intravascular uptake then 0.5 mL of 2% lidocaine was injected.  Then the lateral aspect of the S1, S2, S3 foramen was targeted.  Bone contact made out, Isovue-200 times 0.5 mL demonstrated no nerve root or intravascular uptake within 0.5 mL of 2% lidocaine solution was injected after negative drawback for blood.  Patient tolerated procedure well.  Postinjection instructions given. 

## 2019-09-11 ENCOUNTER — Telehealth: Payer: Self-pay

## 2019-09-11 NOTE — Telephone Encounter (Signed)
Mrs. Samantha Cox had a branch block yesterday. She called to  report that it worked very well. Her pain level is at a level 2.  She would like to proceed with next step or treatment.   Please advise, thank you.

## 2019-09-12 NOTE — Telephone Encounter (Signed)
Schedule for Left sacroiliac RF [phone]

## 2019-09-16 ENCOUNTER — Ambulatory Visit: Payer: BC Managed Care – PPO | Attending: Family Medicine | Admitting: Physical Therapy

## 2019-09-16 ENCOUNTER — Other Ambulatory Visit: Payer: Self-pay

## 2019-09-16 ENCOUNTER — Encounter: Payer: Self-pay | Admitting: Physical Therapy

## 2019-09-16 ENCOUNTER — Telehealth: Payer: Self-pay | Admitting: *Deleted

## 2019-09-16 DIAGNOSIS — M546 Pain in thoracic spine: Secondary | ICD-10-CM | POA: Diagnosis not present

## 2019-09-16 DIAGNOSIS — M542 Cervicalgia: Secondary | ICD-10-CM | POA: Insufficient documentation

## 2019-09-16 DIAGNOSIS — G8929 Other chronic pain: Secondary | ICD-10-CM | POA: Diagnosis not present

## 2019-09-16 DIAGNOSIS — M545 Low back pain, unspecified: Secondary | ICD-10-CM

## 2019-09-16 DIAGNOSIS — M25512 Pain in left shoulder: Secondary | ICD-10-CM | POA: Diagnosis not present

## 2019-09-16 DIAGNOSIS — M25511 Pain in right shoulder: Secondary | ICD-10-CM | POA: Insufficient documentation

## 2019-09-16 NOTE — Therapy (Signed)
[facility] [address] [place], [place], [address] Phone: [phone]   Fax:  [phone]  Physical Therapy Treatment/Discharge  Patient Details  Name: Samantha Cox MRN: [id number] Date of Birth: January 06, 1956 Referring Provider (PT): [name], MD   Encounter Date: 09/16/2019   PT End of Session - 09/16/19 1811    Visit Number 18    Number of Visits 18    Date for PT Re-Evaluation 09/16/19    Authorization Type 30 VL    PT Start Time 1642   pt. arrived late   PT Stop Time 1715    PT Time Calculation (min) 33 min    Behavior During Therapy [facility] for tasks assessed/performed           Past Medical History:  Diagnosis Date  . History of tobacco use   . Routine general medical examination at a health care facility   . Screening for lipoid disorders     Past Surgical History:  Procedure Laterality Date  . OTHER SURGICAL HISTORY     Birthmark removed from forehead 7th grade    There were no vitals filed for this visit.   Subjective Assessment - 09/16/19 1802    Subjective Pt. returns, not seen for therapy since 08/15/19. She saw Dr. Wynn Banker and had left SI nerve block with significant relief of left SI symptoms lasting about 2 days and is current pending ablation procedure 10/04/19. Plan for right hip/hamstring symptoms was to continue with strengthening exercises. Discussed status and plan d/c formal therapy today and pt. will continue with HEP and follow up with Dr. Wynn Banker for ablation procedure. She also notes some left-sided cervical and upper trapezius region tension today as well.    Pertinent History X-rays positive for lumbar dextro-scoliosis and cervical DDD    Currently in Pain? Yes    Pain Score 6     Pain Location Hip   general pain but most pronounced in left SI and right ischial tuberosity region   Pain Orientation Right;Left    Pain Type Chronic pain    Pain Onset More than a month ago    Pain  Frequency Constant    Aggravating Factors  sitting    Pain Relieving Factors movement, gentle ROM    Effect of Pain on Daily Activities limits positional tolerance for sitting                             [facility] Adult PT Treatment/Exercise - 09/16/19 0001      Self-Care   Self-Care Posture;Other Self-Care Comments    Posture reviewed sitting posture, strategies for driving/potential use lumbar roll for support    Other Self-Care Comments  also reviewed Theracane use left upper trap trigger points      Exercises   Other Exercises  Updated HEP instruction and handout review-see chart copy, focus gluteal strengthening, gentle lumbar ROM and left upper trap stretch                  PT Education - 09/16/19 1809    Education Details HEP updates, POC    Person(s) Educated Patient    Methods Explanation;Demonstration;Verbal cues;Handout    Comprehension Verbalized understanding            PT Short Term Goals - 08/01/19 1601      PT SHORT TERM GOAL #1   Title Pt will be able to complete HEPs with at worst  pain of 6/10    Baseline continue goal/update for right hip pain today rated 8-9/10    Time 3    Period Weeks    Status New    Target Date 08/22/19      PT SHORT TERM GOAL #2   Title Pt. will be able to demonstrate and verbalize proper posture    Baseline still some tendency forward head/slouched posture but corrects when aware    Time 3    Period Weeks    Status On-going    Target Date 08/22/19             PT Long Term Goals - 08/01/19 1602      PT LONG TERM GOAL #1   Title Patient will achieve a cervical rotation bilaterally of </= 70 degrees    Baseline continue goal, tx. focus on hip region so neck not assessed today, previous goal of 60 deg met    Time 6    Period Weeks    Status On-going    Target Date 09/12/19      PT LONG TERM GOAL #2   Title Patient will report an at worst pain 3/10    Baseline hip pain 8-9/10    Time 6     Period Weeks    Status On-going    Target Date 09/12/19      PT LONG TERM GOAL #3   Title Tolerate sitting for car travel and eating meals periods at least 30-40 min with hip pain 4/10 or less    Baseline 8-9/10    Time 6    Period Weeks    Status New    Target Date 09/12/19      PT LONG TERM GOAL #4   Title Independent with long term HEP for continued care after d/c    Baseline will progress and establish as appropriate    Time 6    Period Weeks    Status On-going    Target Date 09/12/19      PT LONG TERM GOAL #5   Title Right hip abduction and extension strength 5/5 to improve ability for lifting activities    Baseline 4+/5    Time 6    Period Weeks    Status New    Target Date 09/12/19                 Plan - 09/16/19 1811    Clinical Impression Statement Plan movong forward will be to follow up with Dr. Letta Pate for further management of SI pain with ablation. For hamstring/hip and cervical region will d/c to HEP and recommend MD follow up in the future with any changes in status/if further therapy needed.    Personal Factors and Comorbidities Age;Behavior Pattern;Profession    Examination-Activity Limitations Other;Carry;Continence;Sit;Sleep;Squat;Stand    Examination-Participation Restrictions Other    Stability/Clinical Decision Making Evolving/Moderate complexity    Clinical Decision Making Moderate    Rehab Potential Good    PT Frequency --   NA-d/c to HEP today   PT Duration --   NA   PT Next Visit Plan NA    PT Home Exercise Plan Access code: [email or web address]    Consulted and Agree with Plan of Care Patient           Patient will benefit from skilled therapeutic intervention in order to improve the following deficits and impairments:  Decreased range of motion, Dizziness, Pain, Decreased balance, Hypomobility, Decreased strength, Decreased mobility, Increased muscle spasms, Improper body  mechanics, Postural dysfunction, Impaired flexibility, Decreased  activity tolerance  Visit Diagnosis: Chronic bilateral low back pain without sciatica  Cervicalgia  Chronic left shoulder pain  Chronic right shoulder pain  Pain in thoracic spine     Problem List Patient Active Problem List   Diagnosis Date Noted  . Chronic left SI joint pain 07/31/2019  . Hamstring tendinitis at origin 07/31/2019  . Facet arthritis of lumbar region 03/05/2019  . Chronic rhinitis 12/19/2018  . Shortness of breath 12/19/2018  . Ex-cigarette smoker 12/19/2018  . Greater trochanteric bursitis of right hip 07/10/2018  . Degenerative lumbar spinal stenosis 10/17/2016  . Leg cramping 09/19/2016  . Morton's neuroma of left foot 05/24/2016  . Greater trochanteric bursitis of left hip 11/25/2014  . Degenerative arthritis of hip 01/16/2014  . Acetabular labrum tear 01/16/2014       PHYSICAL THERAPY DISCHARGE SUMMARY  Visits from Start of Care: 18  Current functional level related to goals / functional outcomes: D/c to HEP for therapy. Patient will continue to follow up with Dr. Letta Pate for managament of SI pain/ablation procedure as scheduled later this month   Remaining deficits: Hip weakness, cervical tightness   Education / Equipment: HEP, posture, POC Plan: Patient agrees to discharge.  Patient goals were partially met.                                                                                                      ?????         Beaulah Dinning, PT, DPT 09/16/19 6:16 PM     [facility] [address] [place], [place], [address] Phone: [phone]   Fax:  [phone]  Name: Samantha Cox MRN: [id number] Date of Birth: 1955/12/10

## 2019-09-16 NOTE — Telephone Encounter (Signed)
Pre procedure instructions reviewed for upcoming radiofrequency neurotomy on 10/04/19. No driver required because she does not require pre procedure sedative. Only blood thinner is Advil and does not need to be held. ( I did tell her to not take the night before).  I have put a copy of the pre procedure instructions in the mail for her today.

## 2019-09-19 ENCOUNTER — Ambulatory Visit: Payer: BC Managed Care – PPO | Admitting: Physical Therapy

## 2019-09-23 ENCOUNTER — Ambulatory Visit: Payer: BC Managed Care – PPO | Admitting: Physical Therapy

## 2019-09-26 ENCOUNTER — Encounter: Payer: BC Managed Care – PPO | Admitting: Physical Therapy

## 2019-09-30 ENCOUNTER — Encounter: Payer: BC Managed Care – PPO | Admitting: Physical Therapy

## 2019-10-03 ENCOUNTER — Encounter: Payer: BC Managed Care – PPO | Admitting: Physical Therapy

## 2019-10-04 ENCOUNTER — Other Ambulatory Visit: Payer: Self-pay

## 2019-10-04 ENCOUNTER — Encounter: Payer: Self-pay | Admitting: Physical Medicine & Rehabilitation

## 2019-10-04 ENCOUNTER — Encounter (HOSPITAL_BASED_OUTPATIENT_CLINIC_OR_DEPARTMENT_OTHER): Payer: BC Managed Care – PPO | Admitting: Physical Medicine & Rehabilitation

## 2019-10-04 VITALS — BP 110/76 | HR 76 | Temp 98.3°F | Ht 66.0 in | Wt 172.6 lb

## 2019-10-04 DIAGNOSIS — G8929 Other chronic pain: Secondary | ICD-10-CM

## 2019-10-04 DIAGNOSIS — M76899 Other specified enthesopathies of unspecified lower limb, excluding foot: Secondary | ICD-10-CM | POA: Diagnosis not present

## 2019-10-04 DIAGNOSIS — M533 Sacrococcygeal disorders, not elsewhere classified: Secondary | ICD-10-CM

## 2019-10-04 NOTE — Progress Notes (Signed)
Sacroiliac radio frequency ablation under fluoroscopic guidance This consists of L5 dorsal ramus radio frequency ablation plus neuro lysis of S1-S2 -S3 lateral branches  Indication is sacroiliac pain which has improved temporarily onby at least 50% following sacroiliac intra-articular injection and L5 , S1,2,3 Lateral branch blocks under fluoroscopic guidance. Pain interferes with self-care and mobility and has failed to respond to conservative measures.  Informed consent was obtained after discussing risks and benefits of the procedure with the patient these include bleeding bruising and infection temporary or permanent paralysis. The patient elects to proceed and has given written consent.  Patient placed prone on fluoroscopy table. Area marked and prepped with Betadine. Fluoroscopic images utilized to guide needle. 25-gauge 1.5 inch needle was used to anesthetize 4 injection points with 2 cc of 1% lidocaine each. Then a 18-gauge 10 cm RF needle with a 10 mm curved active tip was inserted under fluoroscopic guidance first targeting the S1 SA P./sacral ala junction, bone contact made and confirmed with lateral imaging.  motor stimulation at 2 Hz confirm proper needle location followed by injection of one cc of a solution containing   1% lidocaine MPF. Radio frequency 80C for 80 seconds was performed. Then the inferolateral aspect of the S1, S2 and lateral aspect of S3 sacral foramina were targeted. Bone contact made.  motor stim at 2 Hz confirm proper needle location. One ML of the /lidocaine solution was injected into each of 3 sites and radio frequency ablation 80C for 90 seconds was performed. Patient tolerated procedure well. Post procedure instructions given 

## 2019-10-04 NOTE — Progress Notes (Signed)
  PROCEDURE RECORD [facility]   Name: Samantha Cox DOB:12-Jan-1956 MRN: [id number]  Date:10/04/2019  Physician: Claudette Laws, MD    Nurse/CMA: Charise Carwin MA Allergies:  Allergies  Allergen Reactions  . Augmentin [Amoxicillin-Pot Clavulanate] Diarrhea and Other (See Comments)    Felt very bad      Consent Signed: Yes.    Is patient diabetic? No.  CBG today? n/a  Pregnant: No. LMP: No LMP recorded. Patient is postmenopausal. (age 100-55)  Anticoagulants: no Anti-inflammatory: no Antibiotics: no  Procedure:Left sacroiliac Radio Frequency Position: Prone11;08 Start Time: 10:42 am End Time: 10:59 am Fluoro Time:49  RN/CMA Shaneya Taketa MA Ashawna Hanback MA    Time 10:25 AM 11:08 AM    BP 110/76 120/75    Pulse 80 68    Respirations  14 16    O2 Sat 95 97    S/S 6 6    Pain Level 4/10 0/10     D/C home with SELF  patient A & O X 3, D/C instructions reviewed, and sits independently.

## 2019-10-04 NOTE — Patient Instructions (Signed)
You had a radio frequency procedure today This was done to alleviate joint pain in your lumbar area We injected lidocaine which is a local anesthetic.  You may experience soreness at the injection sites. You may also experienced some irritation of the nerves that were heated I'm recommending ice for 30 minutes every 2 hours as needed for the next 24-48 hours   

## 2019-10-07 ENCOUNTER — Encounter: Payer: BC Managed Care – PPO | Admitting: Physical Therapy

## 2019-10-07 DIAGNOSIS — Z01419 Encounter for gynecological examination (general) (routine) without abnormal findings: Secondary | ICD-10-CM | POA: Diagnosis not present

## 2019-10-19 DIAGNOSIS — Z20822 Contact with and (suspected) exposure to covid-19: Secondary | ICD-10-CM | POA: Diagnosis not present

## 2019-11-01 ENCOUNTER — Ambulatory Visit: Payer: BC Managed Care – PPO | Admitting: Family Medicine

## 2019-11-01 NOTE — Progress Notes (Deleted)
   I, [name], LAT, ATC, am serving as scribe for Dr. [name].  Samantha Cox is a 64 y.o. female who presents to [facility] today for f/u of R hip pain and new heel pain.  She was last seen by Dr. [name] on 07/30/19 for con't LBP and R hip pain and was referred to PM&R for an SIJ ablation.  She had an L5 RFA and neruolysis of S1-3 on 10/04/19.  Since her last visit w/ Dr. [name], pt reports   Diagnostic imaging: L-spine and R hip MRI- 07/27/19; L-spine XR- 04/12/19; R and L hip XR- 01/31/19  Pertinent review of systems: ***  Relevant historical information: ***   Exam:  There were no vitals taken for this visit. General: Well Developed, well nourished, and in no acute distress.   MSK: ***    Lab and Radiology Results No results found for this or any previous visit (from the past 72 hour(s)). No results found.     Assessment and Plan: 64 y.o. female with ***   PDMP not reviewed this encounter. No orders of the defined types were placed in this encounter.  No orders of the defined types were placed in this encounter.    Discussed warning signs or symptoms. Please see discharge instructions. Patient expresses understanding.   ***

## 2019-11-02 ENCOUNTER — Other Ambulatory Visit: Payer: Self-pay | Admitting: Family Medicine

## 2019-11-04 ENCOUNTER — Ambulatory Visit: Payer: Self-pay

## 2019-11-04 ENCOUNTER — Other Ambulatory Visit: Payer: Self-pay

## 2019-11-04 ENCOUNTER — Ambulatory Visit (INDEPENDENT_AMBULATORY_CARE_PROVIDER_SITE_OTHER): Payer: BC Managed Care – PPO | Admitting: Family Medicine

## 2019-11-04 ENCOUNTER — Encounter: Payer: Self-pay | Admitting: Family Medicine

## 2019-11-04 VITALS — BP 126/80 | HR 99 | Ht 66.0 in | Wt 174.0 lb

## 2019-11-04 DIAGNOSIS — G8929 Other chronic pain: Secondary | ICD-10-CM | POA: Diagnosis not present

## 2019-11-04 DIAGNOSIS — M79672 Pain in left foot: Secondary | ICD-10-CM

## 2019-11-04 DIAGNOSIS — M7061 Trochanteric bursitis, right hip: Secondary | ICD-10-CM | POA: Diagnosis not present

## 2019-11-04 DIAGNOSIS — M722 Plantar fascial fibromatosis: Secondary | ICD-10-CM | POA: Diagnosis not present

## 2019-11-04 NOTE — Progress Notes (Signed)
   I, Ronelle Nigh, LAT, ATC, am serving as scribe for Dr. [name].  RASHADA KLONTZ is a 64 y.o. female who presents to [facility] at [facility] today for f/u of R hip, L SIJ pain and new left heel pain.  She was last seen by Dr. [name] for f/u o fher chronic L-sided LBP/SIJ pain and worsening R hip pain.  She was referred to Dr. Wynn Banker and had a L5 dorsal ramus RFA and neurolysis of S1-3. Patient states she she has new shoes and felt minimal pain. 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 Painful with the first step in the morning. At its worse she couldn't sleep. Pain starts at the heel and radiates to the arch. States she tries to move it and exercise it.  She notes she has bilateral lateral hip pain right worse than left consistent with previous episode of trochanteric bursitis.  This is been ongoing for a few weeks with her worsening heel pain.  She is not tried much treatment for this yet.  In the past she did well with injection and physical therapy.  Pertinent review of systems: No fevers or chills  Relevant historical information: COPD   Exam:  BP 126/80 (BP Location: Left Arm, Patient Position: Sitting)   Pulse 99   Ht 5\' 6"  (1.676 m)   Wt 174 lb (78.9 kg)   SpO2 96%   BMI 28.08 kg/m  General: Well Developed, well nourished, and in no acute distress.   MSK: Left foot normal-appearing Normal foot and ankle motion. Tender palpation medial plantar calcaneus. Pulses cap refill and sensation are intact distally.  Right hip normal.  Normal motion.  Tender palpation right greater trochanter.  Hip abduction and external rotation strength diminished 4/5.  Leg lengths equal    Lab and Radiology Results  Diagnostic Limited MSK Ultrasound of: Left plantar calcaneus Plantar fascia insertion site visualized.  Fascial thickness double normal value at 0.6 at insertion site of area of  tenderness.  No visible tear.  Calcaneal spur present Impression: Plantar fasciitis     Assessment and Plan: 64 y.o. female with left ear plantar fasciitis.  Plan for home exercise program cushioning ice massage and night splint.  If not better consider physical therapy and injection.  Trochanteric bursitis right worse than left.  Again resume home exercise program as taught in clinic today by ATC.  Consider injection in the future if not better.   PDMP not reviewed this encounter. Orders Placed This Encounter  Procedures  . 77 LIMITED JOINT SPACE STRUCTURES LOW LEFT(NO LINKED CHARGES)    Order Specific Question:   Reason for Exam (SYMPTOM  OR DIAGNOSIS REQUIRED)    Answer:   eval heel pain    Order Specific Question:   Preferred imaging location?    Answer:   [facility]   No orders of the defined types were placed in this encounter.    Discussed warning signs or symptoms. Please see discharge instructions. Patient expresses understanding.   The above documentation has been reviewed and is accurate and complete US, M.D.

## 2019-11-04 NOTE — Patient Instructions (Addendum)
Thank you for coming in today. Good supportive shoes with good heel cushion.  Add gel heel cups or silicone gel heel sleeve.  Do the exercises we dicussed. 30 reps 2-3x daily. Go down slowly.   Ice massage or ice bath in the evening.   Consider night splint.   If not better we can do a shot.   I do think you have the bursitis in your hip again.

## 2019-11-05 ENCOUNTER — Ambulatory Visit: Payer: BC Managed Care – PPO | Admitting: Family Medicine

## 2019-11-11 ENCOUNTER — Other Ambulatory Visit: Payer: Self-pay | Admitting: Cardiology

## 2019-11-11 DIAGNOSIS — E7800 Pure hypercholesterolemia, unspecified: Secondary | ICD-10-CM

## 2019-12-02 ENCOUNTER — Telehealth: Payer: Self-pay | Admitting: Family Medicine

## 2019-12-02 DIAGNOSIS — M7061 Trochanteric bursitis, right hip: Secondary | ICD-10-CM

## 2019-12-02 DIAGNOSIS — M722 Plantar fascial fibromatosis: Secondary | ICD-10-CM

## 2019-12-02 DIAGNOSIS — M76899 Other specified enthesopathies of unspecified lower limb, excluding foot: Secondary | ICD-10-CM

## 2019-12-02 DIAGNOSIS — G8929 Other chronic pain: Secondary | ICD-10-CM

## 2019-12-02 NOTE — Telephone Encounter (Signed)
Patient called stating that she would like a referral to [facility] for her continued hip, leg, and heel pain.

## 2019-12-04 NOTE — Telephone Encounter (Signed)
Referral sent 

## 2019-12-10 ENCOUNTER — Ambulatory Visit (INDEPENDENT_AMBULATORY_CARE_PROVIDER_SITE_OTHER): Payer: BC Managed Care – PPO | Admitting: Family Medicine

## 2019-12-10 ENCOUNTER — Other Ambulatory Visit: Payer: Self-pay

## 2019-12-10 VITALS — BP 124/76 | HR 89 | Ht 66.0 in | Wt 173.4 lb

## 2019-12-10 DIAGNOSIS — R5383 Other fatigue: Secondary | ICD-10-CM

## 2019-12-10 DIAGNOSIS — M5416 Radiculopathy, lumbar region: Secondary | ICD-10-CM

## 2019-12-10 DIAGNOSIS — M791 Myalgia, unspecified site: Secondary | ICD-10-CM | POA: Diagnosis not present

## 2019-12-10 NOTE — Patient Instructions (Addendum)
Thank you for coming in today.  Please get labs today before you leave  Please call [facility] at [phone] to schedule your spine injection.   STOP aspirin now. You need to be off aspirin for 1 week prior to the injection.  Off NSAIDs 24 hrs prior to injection.   For muscle pain lets test to see if the statin is causing it.  STOP it for 2 weeks and then restart. See how you feel off statin and on statin.  If we think the statin may be causing muscle pain we have other options that work well.   Keep me updated.   I do agree with 2nd opinion with Dr [name].  Bring your CD of MRIs to your appointment.  If you do not have one call Myriam Jacobson at [facility] and she will make you one. [phone]

## 2019-12-10 NOTE — Progress Notes (Signed)
I, Samantha Cox, LAT, ATC, am serving as scribe for Dr. [name].  Samantha Cox is a 64 y.o. female who presents to [facility] today for f/u of R hip, L SIJ and L heel pain.  She was last seen by Dr. [name] on 11/04/19 w/  L heel pain and was advised to use supportive shoes and a heel cup.  She was also shown Alfredson's eccentric exercises by Dr. [name].  She since asked for a referral to [facility] for her con't hip, leg and heel pain and a referral was placed on 12/04/19.  Since her last visit w/ Dr. [name], pt reports pn in her hips that goes down into her legs that got so significant constant pn that she wanted to be seen by Dr. [name] (R>L). Pt also co of L plantar fascitis. Pn worsen with walking and standing. Rx at home c stretches.  Diagnostic testing: R hip MRI and L-spine MRI- 07/27/19; L-spine and C-spine XR- 04/12/19; L hip XR- 01/31/19  Pertinent review of systems: No fevers or chills  Relevant historical information: On statin due to hyperlipidemia.  Has been on Crestor for about a year now. SI ablation last few months.  Exam:  BP 124/76 (BP Location: Left Arm, Patient Position: Sitting, Cuff Size: Normal)   Pulse 89   Ht 5\' 6"  (1.676 m)   Wt 173 lb 6.4 oz (78.7 kg)   SpO2 97%   BMI 27.99 kg/m  General: Well Developed, well nourished, and in no acute distress.   MSK: L-spine normal-appearing nontender midline. Normal lumbar motion. Thighs bilaterally normal-appearing nontender normal hip and knee motion. Pulses cap refill and sensation intact bilateral lower extremities.    Lab and Radiology Results EXAM: MRI LUMBAR SPINE WITHOUT CONTRAST  TECHNIQUE: Multiplanar, multisequence MR imaging of the lumbar spine was performed. No intravenous contrast was administered.  COMPARISON:  Lumbar spine radiographs 04/12/2019. MRI lumbar spine 10/03/2016  FINDINGS: Segmentation: 5 non rib-bearing lumbar type vertebral bodies  are present. The lowest fully formed vertebral body is L5.  Alignment: Slight retrolisthesis at L1-2 and L2-3 is new. Grade 1 anterolisthesis at L4-5 is stable. Rightward curvature is centered at L3-4.  Vertebrae: Edematous endplate marrow changes the progressed on the left at L3-4. Marrow signal and vertebral body heights are otherwise normal.  Conus medullaris and cauda equina: Conus extends to the L1 level. Conus and cauda equina appear normal.  Paraspinal and other soft tissues: Limited imaging the abdomen is unremarkable. There is no significant adenopathy. No solid organ lesions are present.  Disc levels:  T12-L1: Negative.  L1-2: Mild disc bulging and facet hypertrophy is present. No significant stenosis is present.  L2-3: A progressive leftward disc protrusion is present. Mild left subarticular and foraminal stenosis is new.  L3-4: A broad-based disc protrusion extends into the left neural foramen. Progressive left facet hypertrophy is noted. Moderate left and mild right foraminal narrowing have progressed. Moderate left subarticular stenosis is noted.  L4-5: A broad-based disc protrusion and advanced facet hypertrophy have progressed bilaterally. Progressive moderate right subarticular and foraminal narrowing is noted. Mild left foraminal narrowing is stable.  L5-S1: Progressive moderate facet hypertrophy is present. A shallow central disc protrusion is present. No significant stenosis is present.  IMPRESSION: 1. Progressive multilevel spondylosis of the lumbar spine as described. 2. Mild left subarticular and foraminal stenosis at L2-3 is new. 3. Moderate left subarticular and foraminal and mild right foraminal narrowing at L3-4 have progressed.  4. Progressive moderate right subarticular and foraminal narrowing at L4-5. This is the most significant right-sided disease. 5. Mild left foraminal narrowing at L4-5 is stable. 6. Progressive moderate  facet hypertrophy at L5-S1 without significant stenosis.   Electronically Signed   By: [name] M.D.   On: 07/28/2019 14:43 I, [name], personally (independently) visualized and performed the interpretation of the images attached in this note.     Assessment and Plan: 64 y.o. female with bilateral right worse than left pain into the anterior thigh radiating down into the anterior to medial lower leg.  Could be L4 radiculopathy or myalgias or even possibly claudication. Diagnosis at this time is a bit uncertain.  MRI obtained in June does show opportunity for L3 and L4 radiculopathy.  We will treat as such with epidural steroid injection.  Patient has an upcoming second opinion appointment with [facility] Dr. [name] which is reasonable.  Patient will report back how the epidural steroid injection when in the near future.  Additionally some of the pain she is experiencing could be myalgias.  She is on a statin.  We will plan for limited metabolic work-up listed below.  Also recommend stopping the rosuvastatin for 2 weeks and restarting it to see how she feels.  If she can tolerate rosuvastatin she would be a good candidate for Pitavastatin or even Repatha or Praluent.    PDMP not reviewed this encounter. Orders Placed This Encounter  Procedures  . DG INJECT DIAG/THERA/INC NEEDLE/CATH/PLC EPI/LUMB/SAC W/IMG    LUMB EPI #1 BCBS  PACS (07/27/19) 173 LBS ? THINS/OTC    Standing Status:   Future    Standing Expiration Date:   12/09/2020    Order Specific Question:   Reason for Exam (SYMPTOM  OR DIAGNOSIS REQUIRED)    Answer:   right leg pain likely L4. Level and technique per radiology    Order Specific Question:   Preferred Imaging Location?    Answer:   [id number]    Order Specific Question:   Radiology Contrast Protocol - do NOT remove file path    Answer:   [email or web address]  . CBC    Standing Status:   Future     Number of Occurrences:   1    Standing Expiration Date:   12/09/2020  . TSH    Standing Status:   Future    Number of Occurrences:   1    Standing Expiration Date:   12/09/2020  . CK    Standing Status:   Future    Number of Occurrences:   1    Standing Expiration Date:   12/09/2020  . Sedimentation rate    Standing Status:   Future    Number of Occurrences:   1    Standing Expiration Date:   12/09/2020   No orders of the defined types were placed in this encounter.    Discussed warning signs or symptoms. Please see discharge instructions. Patient expresses understanding.   The above documentation has been reviewed and is accurate and complete [name], M.D.

## 2019-12-11 LAB — CBC
HCT: 42.1 % (ref 36.0–46.0)
Hemoglobin: 14.3 g/dL (ref 12.0–15.0)
MCHC: 34 g/dL (ref 30.0–36.0)
MCV: 94.6 fl (ref 78.0–100.0)
Platelets: 195 10*3/uL (ref 150.0–400.0)
RBC: 4.45 Mil/uL (ref 3.87–5.11)
RDW: 12.9 % (ref 11.5–15.5)
WBC: 7.2 10*3/uL (ref 4.0–10.5)

## 2019-12-11 LAB — TSH: TSH: 2.15 u[IU]/mL (ref 0.35–4.50)

## 2019-12-11 LAB — CK: Total CK: 88 U/L (ref 7–177)

## 2019-12-11 LAB — SEDIMENTATION RATE: Sed Rate: 14 mm/hr (ref 0–30)

## 2019-12-12 ENCOUNTER — Other Ambulatory Visit: Payer: Self-pay | Admitting: Family Medicine

## 2019-12-12 ENCOUNTER — Ambulatory Visit
Admission: RE | Admit: 2019-12-12 | Discharge: 2019-12-12 | Disposition: A | Discharge status: Home / Self Care | Payer: BC Managed Care – PPO | Source: Ambulatory Visit | Attending: Family Medicine | Admitting: Family Medicine

## 2019-12-12 DIAGNOSIS — J449 Chronic obstructive pulmonary disease, unspecified: Secondary | ICD-10-CM | POA: Diagnosis not present

## 2019-12-12 DIAGNOSIS — M5416 Radiculopathy, lumbar region: Secondary | ICD-10-CM

## 2019-12-12 DIAGNOSIS — M5136 Other intervertebral disc degeneration, lumbar region: Secondary | ICD-10-CM | POA: Diagnosis not present

## 2019-12-12 DIAGNOSIS — M47817 Spondylosis without myelopathy or radiculopathy, lumbosacral region: Secondary | ICD-10-CM | POA: Diagnosis not present

## 2019-12-12 MED ORDER — METHYLPREDNISOLONE ACETATE 40 MG/ML INJ SUSP (RADIOLOG
120.0000 mg | Freq: Once | INTRAMUSCULAR | Status: AC
  Administered 2019-12-12: 120 mg via EPIDURAL

## 2019-12-12 MED ORDER — IOPAMIDOL (ISOVUE-M 200) INJECTION 41%
1.0000 mL | Freq: Once | INTRAMUSCULAR | Status: AC
  Administered 2019-12-12: 1 mL via EPIDURAL

## 2019-12-12 NOTE — Discharge Instructions (Signed)

## 2019-12-12 NOTE — Progress Notes (Signed)
Labs look ok.

## 2019-12-13 NOTE — Progress Notes (Signed)
Left a message on pt's cell phone stating her lab results were in and she could view them on MyChart or call us if she had any questions.

## 2019-12-23 DIAGNOSIS — E7800 Pure hypercholesterolemia, unspecified: Secondary | ICD-10-CM

## 2019-12-23 DIAGNOSIS — I2510 Atherosclerotic heart disease of native coronary artery without angina pectoris: Secondary | ICD-10-CM

## 2019-12-23 DIAGNOSIS — I2584 Coronary atherosclerosis due to calcified coronary lesion: Secondary | ICD-10-CM

## 2019-12-23 NOTE — Telephone Encounter (Signed)
From pt

## 2019-12-24 MED ORDER — EZETIMIBE-SIMVASTATIN 10-10 MG PO TABS: 1.0000 | ORAL_TABLET | Freq: Every day | ORAL | 2 refills | Status: DC

## 2019-12-24 NOTE — Telephone Encounter (Signed)
Patient has severe myalgia, discontinue Crestor and try vytorin 10/10  Meds ordered this encounter  Medications  . ezetimibe-simvastatin (VYTORIN) 10-10 MG tablet    Sig: Take 1 tablet by mouth at bedtime.    Dispense:  30 tablet    Refill:  2    Discontinue Crestor severe myalgia    Medications Discontinued During This Encounter  Medication Reason  . rosuvastatin (CRESTOR) 10 MG tablet Side effect (s)      ICD-10-CM   1. Coronary artery calcification  I25.10 ezetimibe-simvastatin (VYTORIN) 10-10 MG tablet   I25.84   2. Hypercholesteremia  E78.00 ezetimibe-simvastatin (VYTORIN) 10-10 MG tablet      Yates Decamp, MD, [facility] 12/24/2019, 6:46 AM Office: [phone] Pager: [phone]

## 2019-12-25 ENCOUNTER — Ambulatory Visit (INDEPENDENT_AMBULATORY_CARE_PROVIDER_SITE_OTHER): Payer: BC Managed Care – PPO | Admitting: Family Medicine

## 2019-12-25 ENCOUNTER — Other Ambulatory Visit: Payer: Self-pay

## 2019-12-25 DIAGNOSIS — T466X5A Adverse effect of antihyperlipidemic and antiarteriosclerotic drugs, initial encounter: Secondary | ICD-10-CM

## 2019-12-25 DIAGNOSIS — R252 Cramp and spasm: Secondary | ICD-10-CM | POA: Diagnosis not present

## 2019-12-25 DIAGNOSIS — M791 Myalgia, unspecified site: Secondary | ICD-10-CM

## 2019-12-25 MED ORDER — BACLOFEN 10 MG PO TABS
10.0000 mg | ORAL_TABLET | Freq: Every evening | ORAL | 0 refills | Status: DC | PRN
Start: 2019-12-25 — End: 2020-03-09

## 2019-12-25 NOTE — Progress Notes (Signed)
Virtual Visit  I connected with   Samantha Cox  by a phone telemedicine application and verified that I am speaking with the correct person using two identifiers.   I discussed the limitations of evaluation and management by telemedicine and the availability of in person appointments. The patient expressed understanding and agreed to proceed.  ? Location of the provider office ? Location of the patient home ? The names and roles of all persons participating in the visit. Patient and myself   History of Present Illness: Samantha Cox is a 64 y.o. female who would like to discuss pain.  Patient was seen about 2 weeks ago for diffuse myalgias.  After evaluation we are concerned that she may be experiencing statin related myalgia and I advised her to temporarily stop her Crestor.  Since she stopped her Crestor she is feeling a lot better with much less pain.  Of note she did have an epidural steroid injection noted below which may be a cofactor here.  However she did discuss this with her cardiologist Dr. Jacinto Halim who stopped Crestor and started Vytorin.  She has not yet started Vytorin.  She does note that she is experiencing bilateral lower extremity cramping.  She does this will wake her from sleep and is problematic and bothersome.  She does have a prescription for methocarbamol but only takes that in the morning and has not tried taking at bedtime yet.  Of note.  At the last visit she was thought to have some L4 radiculopathy based on distribution of pain and prior MRI.  Epidural steroid injection was ordered and done on the fourth.  She notes this has helped her left leg pain.    Having a lot of leg cramps at night. Do not feel creepy crawly and twitchy.  Does have Rx Robaxin but take in the morning and not at bedtime.    Observations/Objective: Exam: Normal Speech.     Assessment and Plan: 64 y.o. female with  Myalgia: To be due to Crestor.  Patient feels better off of  statin.  Radiologist switched to Vytorin which does include 10 mg of simvastatin.  I discussion with patient that if her pain should return on the Vytorin she should let myself and Dr. Jacinto Halim now and consider stopping it.  At that point she is to have good case to proceed to Livalo or Repatha/Praluent.  Leg cramping at night.  Unclear etiology.  Metabolic work-up recently performed was normal.  We will try using existing prescription of methocarbamol at bedtime.  If that is not sufficient for helpful I have prescribed baclofen to try instead.  Recommend not taking both the baclofen and the methocarbamol at the same time.  Radiculopathy improved following epidural steroid injection.  PDMP not reviewed this encounter. No orders of the defined types were placed in this encounter.  Meds ordered this encounter  Medications  . baclofen (LIORESAL) 10 MG tablet    Sig: Take 1-2 tablets (10-20 mg total) by mouth at bedtime as needed for muscle spasms. Do not take with methocarbamol    Dispense:  60 each    Refill:  0    Follow Up Instructions:    I discussed the assessment and treatment plan with the patient. The patient was provided an opportunity to ask questions and all were answered. The patient agreed with the plan and demonstrated an understanding of the instructions.   The patient was advised to call back or seek an in-person  evaluation if the symptoms worsen or if the condition fails to improve as anticipated.  Time: 21 minutes.  Discussed treatment plan and options as above.    Historical information moved to improve visibility of documentation.  Past Medical History:  Diagnosis Date  . History of tobacco use   . Routine general medical examination at a health care facility   . Screening for lipoid disorders    Past Surgical History:  Procedure Laterality Date  . OTHER SURGICAL HISTORY     Birthmark removed from forehead 7th grade   Social History   Tobacco Use  . Smoking  status: Former Smoker    Packs/day: 1.00    Years: 40.00    Pack years: 40.00    Types: Cigarettes    Quit date: 2015    Years since quitting: 6.8  . Smokeless tobacco: Never Used  Substance Use Topics  . Alcohol use: Yes    Alcohol/week: 1.0 standard drink    Types: 1 Glasses of wine per week    Comment: occ   family history includes Asthma in her mother; Atrial fibrillation in her father; Bladder Cancer in her father; CVA (age of onset: 87) in her father; Diverticulitis in her brother; Hypertension in her mother; Lung cancer in her brother.  Medications: Current Outpatient Medications  Medication Sig Dispense Refill  . acetaminophen (TYLENOL) 650 MG CR tablet Take 1,300 mg by mouth daily as needed for pain.    Marland Kitchen albuterol (VENTOLIN HFA) 108 (90 Base) MCG/ACT inhaler  (Patient not taking: Reported on 12/10/2019)    . ANORO ELLIPTA 62.5-25 MCG/INH AEPB Inhale 1 puff into the lungs daily.    Marland Kitchen aspirin EC 81 MG tablet Take 1 tablet (81 mg total) by mouth daily. 90 tablet 3  . azelastine (ASTELIN) 0.1 % nasal spray  (Patient not taking: Reported on 12/10/2019)    . baclofen (LIORESAL) 10 MG tablet Take 1-2 tablets (10-20 mg total) by mouth at bedtime as needed for muscle spasms. Do not take with methocarbamol 60 each 0  . ezetimibe-simvastatin (VYTORIN) 10-10 MG tablet Take 1 tablet by mouth at bedtime. 30 tablet 2  . Fluticasone Propionate (XHANCE) 93 MCG/ACT EXHU Place 2 sprays into both nostrils 2 (two) times daily. (Patient not taking: Reported on 12/10/2019) 32 mL 5  . ibuprofen (ADVIL) 200 MG tablet Take 600 mg by mouth 2 (two) times daily.    . Magnesium 250 MG TABS Take by mouth.    . Melatonin 3 MG TABS Take 1 tablet by mouth as needed.    . methocarbamol (ROBAXIN) 500 MG tablet TAKE 1 TABLET THREE TIMES DAILY AS NEEDED FOR MUSCLE SPASMS. 90 tablet 0  . Multiple Vitamin (MULTI-VITAMIN) tablet Take by mouth.    . Vitamin D, Ergocalciferol, (DRISDOL) 50000 units CAPS capsule Take 1  capsule (50,000 Units total) by mouth every 7 (seven) days. 12 capsule 0   No current facility-administered medications for this visit.   Allergies  Allergen Reactions  . Crestor [Rosuvastatin] Other (See Comments)    Severe myalgia and back pain  . Augmentin [Amoxicillin-Pot Clavulanate] Diarrhea and Other (See Comments)    Felt very bad

## 2020-01-10 DIAGNOSIS — M79672 Pain in left foot: Secondary | ICD-10-CM | POA: Insufficient documentation

## 2020-01-10 DIAGNOSIS — M722 Plantar fascial fibromatosis: Secondary | ICD-10-CM | POA: Insufficient documentation

## 2020-01-10 DIAGNOSIS — M7742 Metatarsalgia, left foot: Secondary | ICD-10-CM | POA: Insufficient documentation

## 2020-01-16 ENCOUNTER — Telehealth: Payer: Self-pay

## 2020-01-16 NOTE — Telephone Encounter (Signed)
Patient can be seen at 7:30 per [facility].

## 2020-01-16 NOTE — Telephone Encounter (Signed)
Patient called stating she has an acute issue with her R knee and she cannot even stand on it today. Patient was wanting to see if she could be seen by doctor [name] but he is on vacation. She was wondering if she could be fit in tomorrow or sometime next week. I informed her I could send a message but she may have to go on the cancellation list. Patient stated understanding, but would like a call back either way.

## 2020-01-16 NOTE — Telephone Encounter (Signed)
Called patient.  Left VM

## 2020-01-16 NOTE — Telephone Encounter (Signed)
7:30 tomorrow 12/10

## 2020-01-17 NOTE — Telephone Encounter (Signed)
Talked to patient. States she will call office to schedule with Clare Gandy

## 2020-01-21 ENCOUNTER — Ambulatory Visit (INDEPENDENT_AMBULATORY_CARE_PROVIDER_SITE_OTHER): Payer: BC Managed Care – PPO | Admitting: Family Medicine

## 2020-01-21 ENCOUNTER — Other Ambulatory Visit: Payer: Self-pay

## 2020-01-21 ENCOUNTER — Encounter: Payer: Self-pay | Admitting: Family Medicine

## 2020-01-21 VITALS — BP 110/76 | HR 73 | Ht 66.0 in | Wt 170.0 lb

## 2020-01-21 DIAGNOSIS — M7061 Trochanteric bursitis, right hip: Secondary | ICD-10-CM

## 2020-01-21 NOTE — Patient Instructions (Signed)
Nice to meet you  Please take 2 pills at 10 pm until the Rayos runs out  Please try the exercises when the pain improves.  Please try heat  Physical therapy should give you a call.  Please send me a message in MyChart with any questions or updates.  Please see Dr. [name] in 4 weeks.   --Dr. Jordan Likes

## 2020-01-21 NOTE — Progress Notes (Signed)
Medication Samples have been provided to the patient.  Drug name: Rayos       Strength: 5 mg        Qty: 2 boxes  LOT: [id number]  Exp.Date: 08/2020  Dosing instructions: Take 2 tablets as close to 10 pm as possible.  The patient has been instructed regarding the correct time, dose, and frequency of taking this medication, including desired effects and most common side effects.   Kathi Simpers, [place] 4:35 PM 01/21/2020

## 2020-01-21 NOTE — Assessment & Plan Note (Signed)
Symptoms seem most consistent with trochanteric pain syndrome.  Likely related to her instability on exam.  Less concerning for radiculopathy at this time. -Counseled on home exercise therapy and supportive care. -Referral to physical therapy. -Provided Rayos samples. -Could consider injection.

## 2020-01-21 NOTE — Progress Notes (Signed)
Samantha Cox - 64 y.o. female MRN [id number]  Date of birth: 04-02-1955  SUBJECTIVE:  Including CC & ROS.  Chief Complaint  Patient presents with  . Knee Pain    Right     Samantha Cox is a 64 y.o. female that is presenting with acute right lateral hip pain.  She had an incident where she felt a pop in the hip.  Since that time she has had significant pain over the lateral aspect with pain over the lateral aspect of the thigh.  Has gotten some improvement with conservative measures.  No numbness or tingling.  Has been ongoing treatment for SI joint issue as well as other hip problems.   Review of Systems See HPI   HISTORY: Past Medical, Surgical, Social, and Family History Reviewed & Updated per EMR.   Pertinent Historical Findings include:  Past Medical History:  Diagnosis Date  . History of tobacco use   . Routine general medical examination at a health care facility   . Screening for lipoid disorders     Past Surgical History:  Procedure Laterality Date  . OTHER SURGICAL HISTORY     Birthmark removed from forehead 7th grade    Family History  Problem Relation Age of Onset  . Hypertension Mother   . Asthma Mother   . Atrial fibrillation Father   . CVA Father 22  . Bladder Cancer Father   . Lung cancer Brother   . Diverticulitis Brother     Social History   Socioeconomic History  . Marital status: Married    Spouse name: Not on file  . Number of children: 2  . Years of education: Not on file  . Highest education level: Not on file  Occupational History  . Not on file  Tobacco Use  . Smoking status: Former Smoker    Packs/day: 1.00    Years: 40.00    Pack years: 40.00    Types: Cigarettes    Quit date: 2015    Years since quitting: 6.9  . Smokeless tobacco: Never Used  Vaping Use  . Vaping Use: Every day  Substance and Sexual Activity  . Alcohol use: Yes    Alcohol/week: 1.0 standard drink    Types: 1 Glasses of wine per week    Comment:  occ  . Drug use: Never  . Sexual activity: Not on file  Other Topics Concern  . Not on file  Social History Narrative  . Not on file   Social Determinants of Health   Financial Resource Strain: Not on file  Food Insecurity: Not on file  Transportation Needs: Not on file  Physical Activity: Not on file  Stress: Not on file  Social Connections: Not on file  Intimate Partner Violence: Not on file     PHYSICAL EXAM:  VS: BP 110/76   Pulse 73   Ht 5\' 6"  (1.676 m)   Wt 170 lb (77.1 kg)   BMI 27.44 kg/m  Physical Exam Gen: NAD, alert, cooperative with exam, well-appearing MSK:  Right hip: Tenderness palpation of the greater trochanter. Instability with 1 leg standing. Instability and weakness with hip flexion and abduction. Negative straight leg raise. Neurovascular intact     ASSESSMENT & PLAN:   Greater trochanteric bursitis of right hip Symptoms seem most consistent with trochanteric pain syndrome.  Likely related to her instability on exam.  Less concerning for radiculopathy at this time. -Counseled on home exercise therapy and supportive care. -Referral to physical  therapy. -Provided Rayos samples. -Could consider injection.

## 2020-02-04 ENCOUNTER — Ambulatory Visit: Payer: Self-pay

## 2020-02-04 ENCOUNTER — Other Ambulatory Visit: Payer: Self-pay

## 2020-02-04 ENCOUNTER — Ambulatory Visit (INDEPENDENT_AMBULATORY_CARE_PROVIDER_SITE_OTHER): Payer: BC Managed Care – PPO | Admitting: Family Medicine

## 2020-02-04 VITALS — BP 128/88 | HR 88 | Ht 66.0 in | Wt 170.4 lb

## 2020-02-04 DIAGNOSIS — M7061 Trochanteric bursitis, right hip: Secondary | ICD-10-CM | POA: Diagnosis not present

## 2020-02-04 NOTE — Progress Notes (Signed)
I, [name], LAT, ATC, am serving as scribe for Dr. [name].  Samantha Cox is a 64 y.o. female who presents to [facility] today for f/u of R lateral hip pain.  She was last seen acutely by Dr. Jordan Likes at [facility] for this issue on 01/21/20 after feeling a pop in her R hip.  She was provided a sample of Rayos and shown a HEP.  She was also referred to PT at [facility] but has not completed any visits.  Since her last visit w/ Dr. [name], pt reports hip pain is excruciating. She opted not to take the prednisone that Dr. Jordan Likes prescribed. Pt locates pain to lateral hip and down the lateral aspect of leg. Pt is unable to step up on R foot going up stairs or a incline.   Aggravates: sleeping on a hard bed.  Diagnostic testing: R hip and L-spine MRI- 07/27/19; L-spine XR- 04/12/19; L hip XR- 01/31/19  Pertinent review of systems: No fevers or chills  Relevant historical information: History gluteus medius tendinopathy MRI right hip June 2021.   Exam:  BP 128/88 (BP Location: Right Arm, Patient Position: Sitting, Cuff Size: Normal)   Pulse 88   Ht 5\' 6"  (1.676 m)   Wt 170 lb 6.4 oz (77.3 kg)   SpO2 98%   BMI 27.50 kg/m  General: Well Developed, well nourished, and in no acute distress.   MSK: Right hip normal-appearing tender palpation greater trochanter.  Pain with resisted hip abduction.    Lab and Radiology Results Procedure: Real-time Ultrasound Guided Injection of right greater trochanter gluteus medius tendon insertion site Device: Philips Affiniti 50G Images permanently stored and available for review in PACS Ultrasound inspection prior to injection reveals intact appearing hip abductor tendons however area of hyperechoic change near the distal tendon insertion gluteus medius tendon with an area of tenderness.  Indicates partial tear. Verbal informed consent obtained.  Discussed risks and benefits of procedure. Warned about  infection bleeding damage to structures skin hypopigmentation and fat atrophy among others. Patient expresses understanding and agreement Time-out conducted.   Noted no overlying erythema, induration, or other signs of local infection.   Skin prepped in a sterile fashion.   Local anesthesia: Topical Ethyl chloride.   With sterile technique and under real time ultrasound guidance:  40 mg of Kenalog and 2 L of Marcaine injected into gluteus medius tendon insertion site/trochanteric bursa. Fluid seen entering the bursa.   Completed without difficulty   Pain immediately resolved suggesting accurate placement of the medication.   Advised to call if fevers/chills, erythema, induration, drainage, or persistent bleeding.   Images permanently stored and available for review in the ultrasound unit.  Impression: Technically successful ultrasound guided injection.      Assessment and Plan: 64 y.o. female with right lateral hip pain exacerbation.  Patient very likely suffered a partial tear of the gluteus medius tendon.  Discussed options.  Plan for steroid injection today along with sickle therapy has already been ordered.  She had dramatic improvement with injection.  Check back if not improving.   PDMP not reviewed this encounter. Orders Placed This Encounter  Procedures  . 70 LIMITED JOINT SPACE STRUCTURES LOW RIGHT(NO LINKED CHARGES)    Standing Status:   Future    Number of Occurrences:   1    Standing Expiration Date:   08/04/2020    Order Specific Question:   Reason for Exam (SYMPTOM  OR  DIAGNOSIS REQUIRED)    Answer:   chronic right hip pain    Order Specific Question:   Preferred imaging location?    Answer:   [facility]   No orders of the defined types were placed in this encounter.    Discussed warning signs or symptoms. Please see discharge instructions. Patient expresses understanding.   The above documentation has been reviewed and is accurate and  complete [name], M.D.

## 2020-02-04 NOTE — Patient Instructions (Addendum)
Thank you for coming in today.  You received an injection today. Seek immediate medical attention if the joint becomes red, extremely painful, or is oozing fluid.  Proceed to PT.

## 2020-02-13 ENCOUNTER — Ambulatory Visit: Payer: BC Managed Care – PPO | Admitting: Physical Therapy

## 2020-02-26 ENCOUNTER — Ambulatory Visit: Payer: BC Managed Care – PPO | Attending: Family Medicine

## 2020-02-26 ENCOUNTER — Other Ambulatory Visit: Payer: Self-pay

## 2020-02-26 DIAGNOSIS — R262 Difficulty in walking, not elsewhere classified: Secondary | ICD-10-CM

## 2020-02-26 DIAGNOSIS — M545 Low back pain, unspecified: Secondary | ICD-10-CM | POA: Diagnosis not present

## 2020-02-26 DIAGNOSIS — M25652 Stiffness of left hip, not elsewhere classified: Secondary | ICD-10-CM | POA: Diagnosis not present

## 2020-02-26 DIAGNOSIS — M25651 Stiffness of right hip, not elsewhere classified: Secondary | ICD-10-CM | POA: Diagnosis not present

## 2020-02-26 DIAGNOSIS — M25551 Pain in right hip: Secondary | ICD-10-CM

## 2020-02-26 DIAGNOSIS — G8929 Other chronic pain: Secondary | ICD-10-CM | POA: Insufficient documentation

## 2020-02-26 DIAGNOSIS — M25552 Pain in left hip: Secondary | ICD-10-CM | POA: Insufficient documentation

## 2020-02-26 NOTE — Therapy (Signed)
[facility] [address] [place], [place], [address] Phone: [phone]   Fax:  [phone]  Physical Therapy Evaluation  Patient Details  Name: Samantha Cox MRN: [id number] Date of Birth: 05-15-1955 Referring Provider (PT): Myra Rude, MD   Encounter Date: 02/26/2020   PT End of Session - 02/26/20 1841    Visit Number 1    Number of Visits 7    Date for PT Re-Evaluation 04/11/20    Authorization Type BC[employer] - FOTO at visit 6 and visit 10    PT Start Time 1845    PT Stop Time 1930    PT Time Calculation (min) 45 min    Activity Tolerance Patient tolerated treatment well    Behavior During Therapy [facility] for tasks assessed/performed           Past Medical History:  Diagnosis Date  . History of tobacco use   . Routine general medical examination at a health care facility   . Screening for lipoid disorders     Past Surgical History:  Procedure Laterality Date  . OTHER SURGICAL HISTORY     Birthmark removed from forehead 7th grade    There were no vitals filed for this visit.    Subjective Assessment - 02/26/20 1840    Subjective "I have had the hip stuff on both sides for quite a while, and injections really help. I have had issues in the low back which is where I had the nerve ablations in August 2021. The ablation helped but was not to 100%. Now it is creeping back though. I had been pretty comfortable for a while. We went to see the light show at [employer] and it suddenly started hurting again while walking uphill.    Pertinent History See PMH above    Limitations Sitting;Standing;Walking;Lifting;House hold activities    How long can you sit comfortably? "It hurts some all the time. It hurts the most when I get up after sitting."    How long can you stand comfortably? "Pretty quick before I start looking for a place to sit down. It's mostly the back that hurts"    How long can you walk comfortably?  "Probably a mile or mile and half"    Diagnostic tests 02/04/2020 [place] of R hip: Ultrasound inspection prior to injection reveals intact appearing hip abductor tendons however area of hyperechoic change near the distal tendon insertion gluteus medius tendon with an area of tenderness.  Indicates partial tear.    Patient Stated Goals "I want to be pain free"    Currently in Pain? Yes    Pain Score 2     Pain Location Hip    Pain Orientation Right;Left    Pain Descriptors / Indicators Dull;Aching    Pain Type Chronic pain    Pain Onset More than a month ago    Pain Frequency Intermittent    Aggravating Factors  walking, prolonged standing    Pain Relieving Factors "not really anything"; resting    Effect of Pain on Daily Activities Difficulty performing daily tasks such as walking and unloading dishwasher              [facility] PT Assessment - 02/26/20 0001      Assessment   Medical Diagnosis Greater trochanteric bursitis of right hip (M70.61)    Referring Provider (PT) Myra Rude, MD    Onset Date/Surgical Date --   "off and on for years"  Hand Dominance Right    Next MD Visit Nothing scheduled yet    Prior Therapy Yes with most recent PT in July 2021      Precautions   Precautions None      Restrictions   Weight Bearing Restrictions No      Balance Screen   Has the patient fallen in the past 6 months No    Has the patient had a decrease in activity level because of a fear of falling?  Yes    Is the patient reluctant to leave their home because of a fear of falling?  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    Leisure Read, hang out with friends, ride horses      Cognition   Overall Cognitive Status Within Functional Limits for tasks assessed      Observation/Other Assessments   Focus on Therapeutic Outcomes (FOTO)  60% functional status; predicted 67% function      Posture/Postural Control   Posture Comments Slouched sitting posture during evaluation      ROM / Strength   AROM / PROM / Strength AROM;Strength      AROM   Overall AROM Comments Lumbar AROM WFL with no pain during evaluation but pt report of "it's just a little tight" during flexion      Strength   Overall Strength Comments hip pain with resisted knee FL bilaterally    Strength Assessment Site Hip;Knee;Ankle    Right/Left Hip Right;Left    Right Hip Flexion 4/5    Right Hip Extension 4/5    Right Hip ABduction 4/5    Right Hip ADduction 4/5    Left Hip Flexion 4/5    Left Hip Extension 4/5    Left Hip ABduction 4/5    Left Hip ADduction 4/5    Right/Left Knee Right;Left    Right Knee Flexion 4+/5    Right Knee Extension 4+/5    Left Knee Flexion 4+/5    Left Knee Extension 4+/5    Right/Left Ankle Right;Left    Right Ankle Dorsiflexion 5/5    Right Ankle Plantar Flexion 5/5   modified test in sitting   Left Ankle Dorsiflexion 5/5    Left Ankle Plantar Flexion 5/5   modified test in sitting     Palpation   Palpation comment TTP along L greater trochanteric bursitis      Special Tests   Other special tests (-) FABER; pain in lateral hip with FADDIR                      Objective measurements completed on examination: See above findings.       [facility] Adult PT Treatment/Exercise - 02/26/20 0001      Self-Care  Self-Care Other Self-Care Comments    Other Self-Care Comments   Reviewed HEP, anatomy of condition, FOTO and potential progress, POC. Provided brown roller and tennis ball with visual and verbal demonstration of how to use for self STM - pt returned demonstration.      Exercises   Exercises Knee/Hip      Knee/Hip Exercises: Stretches   Passive Hamstring Stretch Both;3 reps;30 seconds    Passive Hamstring Stretch Limitations 2x with green strap in supine; 1x seated at EOM    Hip Flexor Stretch Left;30 seconds    Hip Flexor Stretch Limitations Thomas stretch with PT overpressure into L knee FL within tolerance      Knee/Hip Exercises: Seated   Other Seated Knee/Hip Exercises Hip hinge with dowel x 15; cues for technique                  PT Education - 02/26/20 1841    Education Details Reviewed HEP, anatomy of condition, FOTO and potential progress, POC. Provided brown roller and tennis ball with visual and verbal demonstration of how to use for self STM - pt returned demonstration.    Person(s) Educated Patient    Methods Explanation;Demonstration;Handout;Tactile cues;Verbal cues    Comprehension Verbalized understanding;Returned demonstration;Verbal cues required;Tactile cues required            PT Short Term Goals - 02/26/20 1957      PT SHORT TERM GOAL #1   Title Pt will be independent with initial HEP.    Baseline Pt received initial HEP during evaluation 02/26/2020.    Time 3    Period Weeks    Status New    Target Date 03/18/20      PT SHORT TERM GOAL #2   Title Pt will be able to perform resisted knee flexion with </= 2/10 hip pain.    Baseline 2/10 at rest with increased pain during resisted knee FL bilaterally (during MMT assessment)    Time 3    Period Weeks    Status New    Target Date 03/18/20      PT SHORT TERM GOAL #3   Title Pt will report being able to unload/load dishwasher with </= 3/10 pain.    Baseline Pt reports having increased pain and difficulty while repetitively bending over to unload/load dishwasher     Time 3    Period Weeks    Status New    Target Date 03/18/20      PT SHORT TERM GOAL #4   Title Pt will demonstrate optimal body mechanics and ability to perform hip hinge with minimal to no cues for correction.    Baseline Pt demonstrates increased thoracic and lumbar flexion during seated hip hinge at evaluation requiring cues for technique    Time 3    Period Weeks    Status New    Target Date 03/18/20             PT Long Term Goals - 02/26/20 2005      PT LONG TERM GOAL #1   Title Pt will be independent with advanced HEP.    Baseline Pt received initial HEP during evaluation 02/26/2020.    Time 6    Period Weeks    Status New    Target Date 04/08/20      PT LONG TERM GOAL #2   Title Pt will be able to perform resisted knee flexion with </= 1/10 hip pain.    Baseline 2/10 at rest with  increased pain during resisted knee FL bilaterally (during MMT assessment)    Time 6    Period Weeks    Status New    Target Date 04/08/20      PT LONG TERM GOAL #3   Title Pt's FOTO score will increase from 60% to 67% functional status to demonstrate improved perceived ability.    Baseline 60% functional status; predicted 67% function    Time 6    Period Weeks    Status New    Target Date 04/08/20      PT LONG TERM GOAL #4   Title Pt will demonstrate 5/5 bilateral hip MMT to allow for improved functional strength.    Baseline See flowsheet    Time 6    Period Weeks    Status New    Target Date 04/08/20                  Plan - 02/26/20 1841    Clinical Impression Statement Patient is a 65 year old female who presents to OPPT with referral for greater trochanteric bursitis of L hip and complaints of bilateral hip and low back pain. She had experienced relief with physical therapy before and recently received an injection in R hip that has provided temporary pain relief. Ultrasound of R hip 02/04/2020 reveals partial tear of gluteus medius near distal tendon insertion  site per chart (see "diagnostic tests"). She presents with TTP along L greater trochanteric bursa and tautness along ITB. Pt denies any radicular symptoms at this time. She experienced pain in each hip during resisted knee flexion while seated but demonstrates good strength in BLE grossly. She was only able to tolerate sidelying position on either side briefly to allow for hip ABD and ADD MMT secondary to pain along greater trochanter. Pt may benefit from skilled PT intervention to address deficits and allow for return to desired level of function.    Personal Factors and Comorbidities Comorbidity 2;Age;Fitness;Past/Current Experience;Time since onset of injury/illness/exacerbation;Profession    Comorbidities asthma, urticaria    Examination-Activity Limitations Bend;Bed Mobility;Lift;Locomotion Level;Stairs;Squat;Stand    Examination-Participation Restrictions Cleaning;Community Activity;Occupation;Meal Prep    Stability/Clinical Decision Making Stable/Uncomplicated    Clinical Decision Making Low    Rehab Potential Good    PT Frequency 1x / week    PT Duration 6 weeks    PT Treatment/Interventions ADLs/Self Care Home Management;Aquatic Therapy;Cryotherapy;Electrical Stimulation;Iontophoresis 4mg /ml Dexamethasone;Moist Heat;Traction;Gait training;Stair training;Functional mobility training;Therapeutic activities;Therapeutic exercise;Balance training;Neuromuscular re-education;Manual techniques;Patient/family education;Passive range of motion;Dry needling;Energy conservation;Taping    PT Next Visit Plan Assess and update HEP PRN, manual techniques as indicated, core/hip strengthening, low back/hip stretches. Consider 5xSTS vs TUG?    PT Home Exercise Plan [id number] - HS stretch (supine with strap and/or seated), Thomas stretch, hip hinge    Consulted and Agree with Plan of Care Patient           Patient will benefit from skilled therapeutic intervention in order to improve the following deficits  and impairments:  Decreased activity tolerance,Decreased balance,Decreased endurance,Decreased mobility,Decreased strength,Improper body mechanics,Postural dysfunction,Pain,Difficulty walking  Visit Diagnosis: Pain in left hip  Pain in right hip  Difficulty in walking, not elsewhere classified  Stiffness of left hip, not elsewhere classified  Stiffness of right hip, not elsewhere classified  Chronic bilateral low back pain without sciatica     Problem List Patient Active Problem List   Diagnosis Date Noted  . Myalgia due to statin 12/25/2019  . Chronic left SI joint pain 07/31/2019  . Hamstring  tendinitis at origin 07/31/2019  . Facet arthritis of lumbar region 03/05/2019  . Chronic rhinitis 12/19/2018  . Shortness of breath 12/19/2018  . Ex-cigarette smoker 12/19/2018  . Greater trochanteric bursitis of right hip 07/10/2018  . Degenerative lumbar spinal stenosis 10/17/2016  . Leg cramping 09/19/2016  . Morton's neuroma of left foot 05/24/2016  . Greater trochanteric bursitis of left hip 11/25/2014  . Degenerative arthritis of hip 01/16/2014  . Acetabular labrum tear 01/16/2014      Rhea BleacherKajal Karletta Millay, PT, DPT 02/26/20 8:22 PM  [facility] [address] [place], [place], [id number] Phone: [phone]   Fax:  [phone]  Name: Samantha Cox MRN: [id number] Date of Birth: 13-Jan-1956

## 2020-03-06 ENCOUNTER — Other Ambulatory Visit: Payer: Self-pay

## 2020-03-06 ENCOUNTER — Encounter: Payer: Self-pay | Admitting: Physical Medicine & Rehabilitation

## 2020-03-06 ENCOUNTER — Encounter
Payer: BC Managed Care – PPO | Attending: Physical Medicine & Rehabilitation | Admitting: Physical Medicine & Rehabilitation

## 2020-03-06 VITALS — BP 124/82 | HR 74 | Temp 98.0°F | Ht 66.0 in | Wt 176.0 lb

## 2020-03-06 DIAGNOSIS — M533 Sacrococcygeal disorders, not elsewhere classified: Secondary | ICD-10-CM

## 2020-03-06 DIAGNOSIS — M48061 Spinal stenosis, lumbar region without neurogenic claudication: Secondary | ICD-10-CM | POA: Diagnosis not present

## 2020-03-06 DIAGNOSIS — G8929 Other chronic pain: Secondary | ICD-10-CM | POA: Insufficient documentation

## 2020-03-06 NOTE — Progress Notes (Signed)
Subjective:    Patient ID: Samantha Cox, female    DOB: 16-Jun-1955, 65 y.o.   MRN: [id number]  HPI With history of left greater than right low back/upper buttocks pain.  Pain has interfered with work activities.  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  The patient had undergone diagnostic's left sacroiliac nerve blocks which helped reduce pain by at least 50%.  This was followed by left radiofrequency ablation performed on 10/04/2019.  The patient has had good relief with this procedure.  Her baseline pain is low i.e. 2-3.  She has noticed over the last couple weeks that she has increasing pain with a forward flexed posture when standing.  The patient states that her pain is not back to the point where it was before the radiofrequency ablation of the left sacroiliac. She is currently getting physical therapy for her chronic trochanteric bursitis.  She has recently had right trochanteric bursa injection performed by sports medicine on 02/04/2020. The patient is able to do all her basic ADLs as well as all of her work activities with exception of carrying heavier objects i.e. 15 pounds or more which cause increased left lower back/upper buttocks pain Patient also complains of intermittent electrical sensation mainly in her calf area these are very short-lived and not painful Pain Inventory Average Pain 3 Pain Right Now 2 My pain is intermittent, sharp and aching  In the last 24 hours, has pain interfered with the following? General activity 2 Relation with others 0 Enjoyment of life 2 What TIME of day is your pain at its worst? morning  and evening Sleep (in general) Fair  Pain is worse with: walking and standing Pain improves with: rest, therapy/exercise, medication and injections Relief from Meds: 4  Family History  Problem Relation Age of Onset  . Hypertension Mother   . Asthma Mother   . Atrial fibrillation Father   . CVA Father 32  .  Bladder Cancer Father   . Lung cancer Brother   . Diverticulitis Brother    Social History   Socioeconomic History  . Marital status: Married    Spouse name: Not on file  . Number of children: 2  . Years of education: Not on file  . Highest education level: Not on file  Occupational History  . Not on file  Tobacco Use  . Smoking status: Former Smoker    Packs/day: 1.00    Years: 40.00    Pack years: 40.00    Types: Cigarettes    Quit date: 2015    Years since quitting: 7.0  . Smokeless tobacco: Never Used  Vaping Use  . Vaping Use: Every day  Substance and Sexual Activity  . Alcohol use: Yes    Alcohol/week: 1.0 standard drink    Types: 1 Glasses of wine per week    Comment: occ  . Drug use: Never  . Sexual activity: Not on file  Other Topics Concern  . Not on file  Social History Narrative  . Not on file   Social Determinants of Health   Financial Resource Strain: Not on file  Food Insecurity: Not on file  Transportation Needs: Not on file  Physical Activity: Not on file  Stress: Not on file  Social Connections: Not on file   Past Surgical History:  Procedure Laterality Date  . OTHER SURGICAL HISTORY     Birthmark removed from forehead 7th grade   Past Surgical History:  Procedure Laterality Date  . OTHER SURGICAL HISTORY     Birthmark removed from forehead 7th grade   Past Medical History:  Diagnosis Date  . History of tobacco use   . Routine general medical examination at a health care facility   . Screening for lipoid disorders    Temp 98 F (36.7 C)   Ht 5\' 6"  (1.676 m)   Wt 176 lb (79.8 kg)   BMI 28.41 kg/m   Opioid Risk Score:   Fall Risk Score:  `1  Depression screen PHQ 2/9  Depression screen [facility] 2/9 10/04/2019 08/16/2019  Decreased Interest 0 1  Down, Depressed, Hopeless 0 1  PHQ - 2 Score 0 2  Altered sleeping - 2  Tired, decreased energy - 1  Change in appetite - 1  Feeling bad or failure about yourself  - 2  Trouble  concentrating - 1  Moving slowly or fidgety/restless - 0  Suicidal thoughts - 0  PHQ-9 Score - 9  Difficult doing work/chores - Somewhat difficult    Review of Systems  Constitutional: Negative.   HENT: Negative.   Eyes: Negative.   Respiratory: Negative.   Cardiovascular: Negative.   Gastrointestinal: Negative.   Endocrine: Negative.   Genitourinary: Negative.   Musculoskeletal: Positive for arthralgias and back pain.  Neurological: Negative.   Hematological: Negative.   Psychiatric/Behavioral: Negative.   All other systems reviewed and are negative.      Objective:   Physical Exam Vitals reviewed.  Constitutional:      Appearance: She is obese.  HENT:     Head: Normocephalic and atraumatic.  Eyes:     Extraocular Movements: Extraocular movements intact.     Conjunctiva/sclera: Conjunctivae normal.     Pupils: Pupils are equal, round, and reactive to light.  Musculoskeletal:     Comments: Mild tenderness left PSIS  Gaenslens: Positive on left Sacral thrust (prone) : Negative lateral compression: Negative FABER's: Negative Distraction (supine): Negative   Skin:    General: Skin is warm and dry.  Neurological:     Mental Status: She is alert and oriented to person, place, and time.     Comments: Motor strength is 5/5 bilateral lower extremities Negative straight leg raising  Psychiatric:        Mood and Affect: Mood normal.        Behavior: Behavior normal.        Thought Content: Thought content normal.        Judgment: Judgment normal.           Assessment & Plan:  #1.  Left sacroiliac disorder with good results status post radiofrequency ablation performed on 10/04/2019.  She is still experiencing adequate pain relief.  We will not schedule repeat procedure at this time.  We discussed that symptoms will likely gradually recur.  Patient will call to schedule another procedure once pain is consistently higher i.e. at least moderate.  #2.  Left lower  extremity paresthesia as discussed patient does not relate to her sacroiliac ablation rather than more likely related to her lumbar spinal stenosis particularly at L3-L4.  At this point this is not painful, if this does become more painful may benefit from epidural steroid injection under fluoroscopic guidance.

## 2020-03-06 NOTE — Patient Instructions (Addendum)
Please call for repeat Left sacroiliac ablation  Left leg tingling likely from spinal stenosis left L3-4

## 2020-03-08 ENCOUNTER — Other Ambulatory Visit: Payer: Self-pay | Admitting: Family Medicine

## 2020-03-09 NOTE — Telephone Encounter (Signed)
Prescribed back in 12/25/19.

## 2020-03-18 ENCOUNTER — Encounter: Payer: Self-pay | Admitting: Physical Therapy

## 2020-03-18 ENCOUNTER — Ambulatory Visit: Payer: BC Managed Care – PPO | Attending: Family Medicine | Admitting: Physical Therapy

## 2020-03-18 ENCOUNTER — Other Ambulatory Visit: Payer: Self-pay

## 2020-03-18 DIAGNOSIS — M25552 Pain in left hip: Secondary | ICD-10-CM | POA: Diagnosis not present

## 2020-03-18 DIAGNOSIS — M25551 Pain in right hip: Secondary | ICD-10-CM | POA: Insufficient documentation

## 2020-03-18 DIAGNOSIS — G8929 Other chronic pain: Secondary | ICD-10-CM | POA: Diagnosis not present

## 2020-03-18 DIAGNOSIS — R262 Difficulty in walking, not elsewhere classified: Secondary | ICD-10-CM | POA: Diagnosis not present

## 2020-03-18 DIAGNOSIS — M25651 Stiffness of right hip, not elsewhere classified: Secondary | ICD-10-CM

## 2020-03-18 DIAGNOSIS — M545 Low back pain, unspecified: Secondary | ICD-10-CM | POA: Insufficient documentation

## 2020-03-18 DIAGNOSIS — M25652 Stiffness of left hip, not elsewhere classified: Secondary | ICD-10-CM

## 2020-03-18 NOTE — Therapy (Signed)
[facility] [address] [place], [place], [address] Phone: [phone]   Fax:  [phone]  Physical Therapy Treatment  Patient Details  Name: Samantha Cox MRN: [id number] Date of Birth: 1955/05/11 Referring Provider (PT): Myra Rude, MD   Encounter Date: 03/18/2020   PT End of Session - 03/18/20 1446    Visit Number 2    Number of Visits 7    Date for PT Re-Evaluation 04/11/20    Authorization Type BCBS - FOTO at visit 6 and visit 10    PT Start Time 0936    PT Stop Time 1015    PT Time Calculation (min) 39 min    Activity Tolerance Patient tolerated treatment well    Behavior During Therapy [facility] for tasks assessed/performed           Past Medical History:  Diagnosis Date  . History of tobacco use   . Routine general medical examination at a health care facility   . Screening for lipoid disorders     Past Surgical History:  Procedure Laterality Date  . OTHER SURGICAL HISTORY     Birthmark removed from forehead 7th grade    There were no vitals filed for this visit.   Subjective Assessment - 03/18/20 0938    Subjective Pt. reports 3/10 pain this AM with left>right lateral hip pain as well as lumbar pain. She reports lumbosacral pain seems to be returning following period of relief after ablation procedure. Pt. also reports thoracic region trigger point and is interested in dry needling for this region but discussed would need new MD referral to work on this.    Currently in Pain? Yes    Pain Score 3     Pain Location Hip    Pain Orientation Right    Pain Descriptors / Indicators Dull;Aching    Pain Type Chronic pain    Pain Onset More than a month ago    Pain Frequency Intermittent    Aggravating Factors  walking, prolonged standing    Pain Relieving Factors no eases noted    Effect of Pain on Daily Activities difficulty with ambulation and IADLs              [facility] PT Assessment - 03/18/20  0001      Transfers   Five time sit to stand comments  10.3 sec                         [facility] Adult PT Treatment/Exercise - 03/18/20 0001      Knee/Hip Exercises: Stretches   Piriformis Stretch Left;Right;3 reps;30 seconds    Other Knee/Hip Stretches attempted lateral hip/glut stretch but held due to anterior hip pain      Knee/Hip Exercises: Aerobic   Nustep L4 x 5 min LE only      Knee/Hip Exercises: Standing   Hip Abduction AROM;Stengthening;Right;Left;2 sets;10 reps    Abduction Limitations green band    Other Standing Knee Exercises monster walk back and forth 20 feet x 2 with green band proximal to knees      Manual Therapy   Manual Therapy Joint mobilization;Soft tissue mobilization    Joint Mobilization LAD bilat. hips grade I-III oscillations, manual unilateral posterior innominate rotation with AP grade III at ASIS performed bilaterally    Soft tissue mobilization STM gluteus medius region in sidelying bilaterally with pillow between knees  PT Short Term Goals - 02/26/20 1957      PT SHORT TERM GOAL #1   Title Pt will be independent with initial HEP.    Baseline Pt received initial HEP during evaluation 02/26/2020.    Time 3    Period Weeks    Status New    Target Date 03/18/20      PT SHORT TERM GOAL #2   Title Pt will be able to perform resisted knee flexion with </= 2/10 hip pain.    Baseline 2/10 at rest with increased pain during resisted knee FL bilaterally (during MMT assessment)    Time 3    Period Weeks    Status New    Target Date 03/18/20      PT SHORT TERM GOAL #3   Title Pt will report being able to unload/load dishwasher with </= 3/10 pain.    Baseline Pt reports having increased pain and difficulty while repetitively bending over to unload/load dishwasher    Time 3    Period Weeks    Status New    Target Date 03/18/20      PT SHORT TERM GOAL #4   Title Pt will demonstrate optimal body mechanics  and ability to perform hip hinge with minimal to no cues for correction.    Baseline Pt demonstrates increased thoracic and lumbar flexion during seated hip hinge at evaluation requiring cues for technique    Time 3    Period Weeks    Status New    Target Date 03/18/20             PT Long Term Goals - 02/26/20 2005      PT LONG TERM GOAL #1   Title Pt will be independent with advanced HEP.    Baseline Pt received initial HEP during evaluation 02/26/2020.    Time 6    Period Weeks    Status New    Target Date 04/08/20      PT LONG TERM GOAL #2   Title Pt will be able to perform resisted knee flexion with </= 1/10 hip pain.    Baseline 2/10 at rest with increased pain during resisted knee FL bilaterally (during MMT assessment)    Time 6    Period Weeks    Status New    Target Date 04/08/20      PT LONG TERM GOAL #3   Title Pt's FOTO score will increase from 60% to 67% functional status to demonstrate improved perceived ability.    Baseline 60% functional status; predicted 67% function    Time 6    Period Weeks    Status New    Target Date 04/08/20      PT LONG TERM GOAL #4   Title Pt will demonstrate 5/5 bilateral hip MMT to allow for improved functional strength.    Baseline See flowsheet    Time 6    Period Weeks    Status New    Target Date 04/08/20                 Plan - 03/18/20 1446    Clinical Impression Statement Tx. focus lateral hip strengthening/proximal hip stability to facilitate tissue healing/remodeling secondary to underlying abductor tendinopathy along with manual therapy to hip and pelvic region to address accompanying SI soreness. Mild soreness with exercises but overall session well-tolerated-given symptom etiology and chronicity with past episodes expect gradual progress to help relieve pain and progress with therapy goals.  Personal Factors and Comorbidities Comorbidity 2;Age;Fitness;Past/Current Experience;Time since onset of  injury/illness/exacerbation;Profession    Comorbidities asthma, urticaria    Examination-Activity Limitations Bend;Bed Mobility;Lift;Locomotion Level;Stairs;Squat;Stand    Examination-Participation Restrictions Cleaning;Community Activity;Occupation;Meal Prep    Stability/Clinical Decision Making Stable/Uncomplicated    Clinical Decision Making Low    Rehab Potential Good    PT Frequency 1x / week    PT Duration 6 weeks    PT Treatment/Interventions ADLs/Self Care Home Management;Aquatic Therapy;Cryotherapy;Electrical Stimulation;Iontophoresis 4mg /ml Dexamethasone;Moist Heat;Traction;Gait training;Stair training;Functional mobility training;Therapeutic activities;Therapeutic exercise;Balance training;Neuromuscular re-education;Manual techniques;Patient/family education;Passive range of motion;Dry needling;Energy conservation;Taping    PT Next Visit Plan Continue hip strengthening/stability and lumbopelvic stabilization as tolerated pending pain, STM vs. foam roll lateral hip region, other manual/mobs as tolerated, hip stretches, if new referral received for thoracic region re-eval/add to POC as needed    PT Home Exercise Plan - HS stretch (supine with strap and/or seated), Thomas stretch, hip hinge    Consulted and Agree with Plan of Care Patient           Patient will benefit from skilled therapeutic intervention in order to improve the following deficits and impairments:  Decreased activity tolerance,Decreased balance,Decreased endurance,Decreased mobility,Decreased strength,Improper body mechanics,Postural dysfunction,Pain,Difficulty walking  Visit Diagnosis: Pain in left hip  Pain in right hip  Difficulty in walking, not elsewhere classified  Stiffness of left hip, not elsewhere classified  Stiffness of right hip, not elsewhere classified  Chronic bilateral low back pain without sciatica     Problem List Patient Active Problem List   Diagnosis Date Noted  . Myalgia  due to statin 12/25/2019  . Chronic left SI joint pain 07/31/2019  . Hamstring tendinitis at origin 07/31/2019  . Facet arthritis of lumbar region 03/05/2019  . Chronic rhinitis 12/19/2018  . Shortness of breath 12/19/2018  . Ex-cigarette smoker 12/19/2018  . Greater trochanteric bursitis of right hip 07/10/2018  . Degenerative lumbar spinal stenosis 10/17/2016  . Leg cramping 09/19/2016  . Morton's neuroma of left foot 05/24/2016  . Greater trochanteric bursitis of left hip 11/25/2014  . Degenerative arthritis of hip 01/16/2014  . Acetabular labrum tear 01/16/2014    14/11/2013, PT, DPT 03/18/20 2:54 PM  [facility] [address] [place], [place], [place] Phone: [phone]   Fax:  [phone]  Name: Samantha Cox MRN: Bernadette Hoit Date of Birth: [birth year]

## 2020-03-25 ENCOUNTER — Other Ambulatory Visit: Payer: Self-pay

## 2020-03-25 ENCOUNTER — Ambulatory Visit: Payer: BC Managed Care – PPO

## 2020-03-25 DIAGNOSIS — R262 Difficulty in walking, not elsewhere classified: Secondary | ICD-10-CM

## 2020-03-25 DIAGNOSIS — M25651 Stiffness of right hip, not elsewhere classified: Secondary | ICD-10-CM

## 2020-03-25 DIAGNOSIS — M25652 Stiffness of left hip, not elsewhere classified: Secondary | ICD-10-CM | POA: Diagnosis not present

## 2020-03-25 DIAGNOSIS — M25552 Pain in left hip: Secondary | ICD-10-CM

## 2020-03-25 DIAGNOSIS — M25551 Pain in right hip: Secondary | ICD-10-CM

## 2020-03-25 DIAGNOSIS — G8929 Other chronic pain: Secondary | ICD-10-CM | POA: Diagnosis not present

## 2020-03-25 DIAGNOSIS — M545 Low back pain, unspecified: Secondary | ICD-10-CM | POA: Diagnosis not present

## 2020-03-25 NOTE — Therapy (Signed)
[facility] [address] [place], [place], [address] Phone: [phone]   Fax:  [phone]  Physical Therapy Treatment  Patient Details  Name: Samantha Cox MRN: [id number] Date of Birth: [birth year] Referring Provider (PT): Myra Rude, MD   Encounter Date: 03/25/2020   PT End of Session - 03/25/20 1008    Visit Number 3    Number of Visits 7    Date for PT Re-Evaluation 04/11/20    Authorization Type BCBS - FOTO at visit 6 and visit 10    PT Start Time 1008   pt arrived late   PT Stop Time 1046    PT Time Calculation (min) 38 min    Activity Tolerance Patient tolerated treatment well    Behavior During Therapy [facility] for tasks assessed/performed           Past Medical History:  Diagnosis Date  . History of tobacco use   . Routine general medical examination at a health care facility   . Screening for lipoid disorders     Past Surgical History:  Procedure Laterality Date  . OTHER SURGICAL HISTORY     Birthmark removed from forehead 7th grade    There were no vitals filed for this visit.   Subjective Assessment - 03/25/20 1013    Subjective Pt reports 1/10 pain in L low back along lumbosacral region but states she took advil this morning prior to session. Pt states she is getting another ablation the first week of March 2022.    Pertinent History See PMH above    Limitations Sitting;Standing;Walking;Lifting;House hold activities    How long can you sit comfortably? "It hurts some all the time. It hurts the most when I get up after sitting."    How long can you stand comfortably? "Pretty quick before I start looking for a place to sit down. It's mostly the back that hurts"    How long can you walk comfortably? "Probably a mile or mile and half"    Diagnostic tests 02/04/2020 [place] of R hip: Ultrasound inspection prior to injection reveals intact appearing hip abductor tendons however area of hyperechoic change  near the distal tendon insertion gluteus medius tendon with an area of tenderness.  Indicates partial tear.    Patient Stated Goals "I want to be pain free"    Currently in Pain? Yes    Pain Score 1     Pain Location Back    Pain Orientation Left    Pain Descriptors / Indicators Dull;Aching    Pain Type Chronic pain    Pain Onset More than a month ago    Pain Frequency Intermittent              [facility] PT Assessment - 03/25/20 0001      Assessment   Medical Diagnosis Greater trochanteric bursitis of right hip (M70.61)    Referring Provider (PT) Myra Rude, MD                         [facility] Adult PT Treatment/Exercise - 03/25/20 0001      Knee/Hip Exercises: Stretches   Piriformis Stretch Right;Left;1 rep;Other (comment)   90 seconds   Other Knee/Hip Stretches SKTC x 30 sec each      Knee/Hip Exercises: Aerobic   Nustep L6 x 5 min LE only      Knee/Hip Exercises: Standing   Hip Abduction Stengthening;Both;2 sets;15 reps;Knee straight  Abduction Limitations green theraband at ankles    Lateral Step Up Both;1 set;15 reps;Step Height: 6"    Other Standing Knee Exercises monster walk and lateral stepping with green theraband proximal to knees 2 x 30 feet each (1 x 30 feet leading with each LE for lateral stepping)    Other Standing Knee Exercises deadlift with 10# KB then 15# KB from 6" step 2 x 15 with cues for technique      Knee/Hip Exercises: Sidelying   Hip ABduction Strengthening;Both;1 set;15 reps      Manual Therapy   Manual Therapy Soft tissue mobilization    Soft tissue mobilization L glute med STM and IASTM along glute med, TFL                  PT Education - 03/25/20 1332    Education Details Updated HEP; rationale/technique during interventions    Person(s) Educated Patient    Methods Explanation;Demonstration;Verbal cues;Handout    Comprehension Verbalized understanding;Returned demonstration            PT Short Term  Goals - 03/25/20 1339      PT SHORT TERM GOAL #1   Title Pt will be independent with initial HEP.    Baseline Pt received initial HEP during evaluation 02/26/2020.    Time 3    Period Weeks    Status Achieved    Target Date 03/18/20      PT SHORT TERM GOAL #2   Title Pt will be able to perform resisted knee flexion with </= 2/10 hip pain.    Baseline 2/10 at rest with increased pain during resisted knee FL bilaterally (during MMT assessment)    Time 3    Period Weeks    Status New    Target Date 03/18/20      PT SHORT TERM GOAL #3   Title Pt will report being able to unload/load dishwasher with </= 3/10 pain.    Baseline Pt reports having increased pain and difficulty while repetitively bending over to unload/load dishwasher    Time 3    Period Weeks    Status New    Target Date 03/18/20      PT SHORT TERM GOAL #4   Title Pt will demonstrate optimal body mechanics and ability to perform hip hinge with minimal to no cues for correction.    Baseline Minimal cues provided as pt begins to squat rather than perform hip hinge    Time 3    Period Weeks    Status On-going    Target Date 03/18/20             PT Long Term Goals - 02/26/20 2005      PT LONG TERM GOAL #1   Title Pt will be independent with advanced HEP.    Baseline Pt received initial HEP during evaluation 02/26/2020.    Time 6    Period Weeks    Status New    Target Date 04/08/20      PT LONG TERM GOAL #2   Title Pt will be able to perform resisted knee flexion with </= 1/10 hip pain.    Baseline 2/10 at rest with increased pain during resisted knee FL bilaterally (during MMT assessment)    Time 6    Period Weeks    Status New    Target Date 04/08/20      PT LONG TERM GOAL #3   Title Pt's FOTO score will increase from 60% to  67% functional status to demonstrate improved perceived ability.    Baseline 60% functional status; predicted 67% function    Time 6    Period Weeks    Status New    Target Date  04/08/20      PT LONG TERM GOAL #4   Title Pt will demonstrate 5/5 bilateral hip MMT to allow for improved functional strength.    Baseline See flowsheet    Time 6    Period Weeks    Status New    Target Date 04/08/20                 Plan - 03/25/20 1027    Clinical Impression Statement Patient tolerated session well with no adverse effects or complaints of increased pain. Continued hip strengthening for improved stability with good tolerance. She expresses that intensity of TTP along L glute med and lateral thigh has decreased gradually. Pt was out of town and has not yet requested referral for thoracic pain but plans to now that she has returned.    Personal Factors and Comorbidities Comorbidity 2;Age;Fitness;Past/Current Experience;Time since onset of injury/illness/exacerbation;Profession    Comorbidities asthma, urticaria    Examination-Activity Limitations Bend;Bed Mobility;Lift;Locomotion Level;Stairs;Squat;Stand    Examination-Participation Restrictions Cleaning;Community Activity;Occupation;Meal Prep    Stability/Clinical Decision Making Stable/Uncomplicated    Rehab Potential Good    PT Frequency 1x / week    PT Duration 6 weeks    PT Treatment/Interventions ADLs/Self Care Home Management;Aquatic Therapy;Cryotherapy;Electrical Stimulation;Iontophoresis 4mg /ml Dexamethasone;Moist Heat;Traction;Gait training;Stair training;Functional mobility training;Therapeutic activities;Therapeutic exercise;Balance training;Neuromuscular re-education;Manual techniques;Patient/family education;Passive range of motion;Dry needling;Energy conservation;Taping    PT Next Visit Plan Assess STG. Continue hip strengthening/stability and lumbopelvic stabilization as tolerated pending pain, STM vs. foam roll lateral hip region, other manual/mobs as tolerated, hip stretches, if new referral received for thoracic region re-eval/add to POC as needed    PT Home Exercise Plan - HS stretch (supine  with strap and/or seated), Thomas stretch, hip hinge, hip ABD, deadlift, monster walks (green)    Consulted and Agree with Plan of Care Patient           Patient will benefit from skilled therapeutic intervention in order to improve the following deficits and impairments:  Decreased activity tolerance,Decreased balance,Decreased endurance,Decreased mobility,Decreased strength,Improper body mechanics,Postural dysfunction,Pain,Difficulty walking  Visit Diagnosis: Pain in left hip  Pain in right hip  Difficulty in walking, not elsewhere classified  Stiffness of left hip, not elsewhere classified  Stiffness of right hip, not elsewhere classified  Chronic bilateral low back pain without sciatica     Problem List Patient Active Problem List   Diagnosis Date Noted  . Myalgia due to statin 12/25/2019  . Chronic left SI joint pain 07/31/2019  . Hamstring tendinitis at origin 07/31/2019  . Facet arthritis of lumbar region 03/05/2019  . Chronic rhinitis 12/19/2018  . Shortness of breath 12/19/2018  . Ex-cigarette smoker 12/19/2018  . Greater trochanteric bursitis of right hip 07/10/2018  . Degenerative lumbar spinal stenosis 10/17/2016  . Leg cramping 09/19/2016  . Morton's neuroma of left foot 05/24/2016  . Greater trochanteric bursitis of left hip 11/25/2014  . Degenerative arthritis of hip 01/16/2014  . Acetabular labrum tear 01/16/2014      14/11/2013, PT, DPT 03/25/20 1:42 PM  [facility] [address] [place], [place], [place] Phone: [phone]   Fax:  [phone]  Name: Samantha Cox MRN: Bernadette Hoit Date of Birth: 1955-12-08

## 2020-04-01 ENCOUNTER — Ambulatory Visit: Payer: BC Managed Care – PPO

## 2020-04-01 ENCOUNTER — Other Ambulatory Visit: Payer: Self-pay

## 2020-04-01 DIAGNOSIS — R262 Difficulty in walking, not elsewhere classified: Secondary | ICD-10-CM

## 2020-04-01 DIAGNOSIS — G8929 Other chronic pain: Secondary | ICD-10-CM | POA: Diagnosis not present

## 2020-04-01 DIAGNOSIS — M25551 Pain in right hip: Secondary | ICD-10-CM

## 2020-04-01 DIAGNOSIS — M25652 Stiffness of left hip, not elsewhere classified: Secondary | ICD-10-CM

## 2020-04-01 DIAGNOSIS — M25552 Pain in left hip: Secondary | ICD-10-CM

## 2020-04-01 DIAGNOSIS — M25651 Stiffness of right hip, not elsewhere classified: Secondary | ICD-10-CM

## 2020-04-01 DIAGNOSIS — M545 Low back pain, unspecified: Secondary | ICD-10-CM | POA: Diagnosis not present

## 2020-04-01 NOTE — Therapy (Signed)
[place], [place], [address] Phone: [phone]   Fax:  [phone]  Physical Therapy Treatment   Patient Details  Name: Samantha Cox MRN: [id number] Date of Birth: 20-Mar-1955 Referring Provider (PT): Rosemarie Ax, MD   Encounter Date: 04/01/2020   PT End of Session - 04/01/20 1011    Visit Number 4    Number of Visits 7    Date for PT Re-Evaluation 04/11/20    Authorization Type BCBS - FOTO at visit 6 and visit 10    PT Start Time 1011   pt arrived late   PT Stop Time 1046    PT Time Calculation (min) 35 min    Activity Tolerance Patient tolerated treatment well    Behavior During Therapy [facility] for tasks assessed/performed           Past Medical History:  Diagnosis Date  . History of tobacco use   . Routine general medical examination at a health care facility   . Screening for lipoid disorders     Past Surgical History:  Procedure Laterality Date  . OTHER SURGICAL HISTORY     Birthmark removed from forehead 7th grade    There were no vitals filed for this visit.   Subjective Assessment - 04/01/20 1014    Subjective Pt reports increased soreness and pain in L hip and low back after last session. She states she has an appointment to have ablation next week, which should help her feel better.    Pertinent History See PMH above    Limitations Sitting;Standing;Walking;Lifting;House hold activities    How long can you sit comfortably? "It hurts some all the time. It hurts the most when I get up after sitting."    How long can you stand comfortably? "Pretty quick before I start looking for a place to sit down. It's mostly the back that hurts"    How long can you walk comfortably? "Probably a mile or mile and half"    Diagnostic tests 02/04/2020 [place] of R hip: Ultrasound inspection prior to injection reveals intact appearing hip abductor tendons however area of hyperechoic change near the  distal tendon insertion gluteus medius tendon with an area of tenderness.  Indicates partial tear.    Patient Stated Goals "I want to be pain free"    Currently in Pain? Yes    Pain Score 3    "2-3"   Pain Location Back    Pain Orientation Left    Pain Descriptors / Indicators Aching;Dull;Sharp   sharper down to L hip   Pain Type Chronic pain    Pain Onset More than a month ago              [facility] PT Assessment - 04/01/20 0001      Assessment   Medical Diagnosis Greater trochanteric bursitis of right hip (M70.61)    Referring Provider (PT) Rosemarie Ax, MD      Strength   Overall Strength Comments BLE MMT 4+/5 grossly with no pain                         [facility] Adult PT Treatment/Exercise - 04/01/20 0001      Self-Care   Self-Care Other Self-Care Comments    Other Self-Care Comments  See patient education      Knee/Hip Exercises: Stretches   Other Knee/Hip Stretches SKTC x 30 sec each  Knee/Hip Exercises: Aerobic   Nustep L5 x 5 min LE only      Knee/Hip Exercises: Standing   Other Standing Knee Exercises hip hinge with dowel x 20      Knee/Hip Exercises: Prone   Other Prone Exercises quadruped fire hydrant x 10 each LE then bird dog x 10 each UE/LE      Manual Therapy   Manual therapy comments Passive prone hip FL stretch x 90 sec    Soft tissue mobilization STM along L glute med, ITB, and TFL                  PT Education - 04/01/20 1337    Education Details Reviewed and updated HEP to include hooklying and quadruped core and hip strengthening exercises. Body mechanics when unloading/loading dishwasher and bending in general    Person(s) Educated Patient    Methods Explanation;Demonstration;Tactile cues;Verbal cues;Handout    Comprehension Verbalized understanding;Returned demonstration            PT Short Term Goals - 04/01/20 1029      PT SHORT TERM GOAL #1   Title Pt will be independent with initial HEP.    Baseline Pt  received initial HEP during evaluation 02/26/2020.    Time 3    Period Weeks    Status Achieved    Target Date 03/18/20      PT SHORT TERM GOAL #2   Title Pt will be able to perform resisted knee flexion with </= 2/10 hip pain.    Baseline no pain during resistance    Time 3    Period Weeks    Status Achieved    Target Date 03/18/20      PT SHORT TERM GOAL #3   Title Pt will report being able to unload/load dishwasher with </= 3/10 pain.    Baseline Depending on the day, pt feels more fatigue than pain but still has pain eventually with the repetitive bending    Time 3    Period Weeks    Status Partially Met    Target Date 03/18/20      PT SHORT TERM GOAL #4   Title Pt will demonstrate optimal body mechanics and ability to perform hip hinge with minimal to no cues for correction.    Baseline Minimal cues provided    Time 3    Period Weeks    Status Partially Met    Target Date 03/18/20             PT Long Term Goals - 02/26/20 2005      PT LONG TERM GOAL #1   Title Pt will be independent with advanced HEP.    Baseline Pt received initial HEP during evaluation 02/26/2020.    Time 6    Period Weeks    Status New    Target Date 04/08/20      PT LONG TERM GOAL #2   Title Pt will be able to perform resisted knee flexion with </= 1/10 hip pain.    Baseline 2/10 at rest with increased pain during resisted knee FL bilaterally (during MMT assessment)    Time 6    Period Weeks    Status New    Target Date 04/08/20      PT LONG TERM GOAL #3   Title Pt's FOTO score will increase from 60% to 67% functional status to demonstrate improved perceived ability.    Baseline 60% functional status; predicted 67% function  Time 6    Period Weeks    Status New    Target Date 04/08/20      PT LONG TERM GOAL #4   Title Pt will demonstrate 5/5 bilateral hip MMT to allow for improved functional strength.    Baseline See flowsheet    Time 6    Period Weeks    Status New     Target Date 04/08/20                 Plan - 04/01/20 1017    Clinical Impression Statement Patient tolerated session well with some soreness in her L hip but no complaints of sharp pains this session. Initiated quadruped interventions without increased hip or low back pain but difficulty maintaining position for long due to wrist pain regardless of hands in fist or wrist extended. She experienced a period of relief following previous ablation procedure and expects the same for upcoming ablation - PT and pt discussed for patient to schedule next PT session ~2 weeks after ablation procedure to see if patient is able to perform interventions with improved tolerance during and afterwards since her pain has been more consistent recently. She plans to request referral for her previously mentioned thoracic pain as well to proceed with TPDN if still indicated at that time.    Personal Factors and Comorbidities Comorbidity 2;Age;Fitness;Past/Current Experience;Time since onset of injury/illness/exacerbation;Profession    Comorbidities asthma, urticaria    Examination-Activity Limitations Bend;Bed Mobility;Lift;Locomotion Level;Stairs;Squat;Stand    Examination-Participation Restrictions Cleaning;Community Activity;Occupation;Meal Prep    Stability/Clinical Decision Making Stable/Uncomplicated    Rehab Potential Good    PT Frequency 1x / week    PT Duration 6 weeks    PT Treatment/Interventions ADLs/Self Care Home Management;Aquatic Therapy;Cryotherapy;Electrical Stimulation;Iontophoresis 4mg /ml Dexamethasone;Moist Heat;Traction;Gait training;Stair training;Functional mobility training;Therapeutic activities;Therapeutic exercise;Balance training;Neuromuscular re-education;Manual techniques;Patient/family education;Passive range of motion;Dry needling;Energy conservation;Taping    PT Next Visit Plan Re-evaluation. Continue hip strengthening/stability and lumbopelvic stabilization as tolerated pending  pain, STM vs. foam roll lateral hip region, other manual/mobs as tolerated, hip stretches, if new referral received for thoracic region re-eval/add to POC as needed    PT Home Exercise Plan [id number] - HS stretch (supine with strap and/or seated), Thomas stretch, hip hinge, hip ABD, deadlift, monster walks (green), bird dog, fire hydrant, dead bug    Consulted and Agree with Plan of Care Patient           Patient will benefit from skilled therapeutic intervention in order to improve the following deficits and impairments:  Decreased activity tolerance,Decreased balance,Decreased endurance,Decreased mobility,Decreased strength,Improper body mechanics,Postural dysfunction,Pain,Difficulty walking  Visit Diagnosis: Pain in left hip  Pain in right hip  Difficulty in walking, not elsewhere classified  Stiffness of left hip, not elsewhere classified  Stiffness of right hip, not elsewhere classified  Chronic bilateral low back pain without sciatica     Problem List Patient Active Problem List   Diagnosis Date Noted  . Myalgia due to statin 12/25/2019  . Chronic left SI joint pain 07/31/2019  . Hamstring tendinitis at origin 07/31/2019  . Facet arthritis of lumbar region 03/05/2019  . Chronic rhinitis 12/19/2018  . Shortness of breath 12/19/2018  . Ex-cigarette smoker 12/19/2018  . Greater trochanteric bursitis of right hip 07/10/2018  . Degenerative lumbar spinal stenosis 10/17/2016  . Leg cramping 09/19/2016  . Morton's neuroma of left foot 05/24/2016  . Greater trochanteric bursitis of left hip 11/25/2014  . Degenerative arthritis of hip 01/16/2014  . Acetabular labrum tear 01/16/2014  Haydee Monica, PT, DPT 04/01/20 1:51 PM  [facility] [address] [place], [place], [address] Phone: [phone]   Fax:  [phone]  Name: TEREN FRANCKOWIAK MRN: [id number] Date of Birth: 1955/03/25

## 2020-04-09 ENCOUNTER — Encounter: Payer: BC Managed Care – PPO | Admitting: Physical Therapy

## 2020-04-10 ENCOUNTER — Encounter: Payer: Self-pay | Admitting: Physical Medicine & Rehabilitation

## 2020-04-10 ENCOUNTER — Encounter
Payer: BC Managed Care – PPO | Attending: Physical Medicine & Rehabilitation | Admitting: Physical Medicine & Rehabilitation

## 2020-04-10 ENCOUNTER — Other Ambulatory Visit: Payer: Self-pay

## 2020-04-10 VITALS — BP 116/79 | HR 76 | Temp 98.2°F | Ht 66.0 in | Wt 172.0 lb

## 2020-04-10 DIAGNOSIS — G8929 Other chronic pain: Secondary | ICD-10-CM

## 2020-04-10 DIAGNOSIS — H2513 Age-related nuclear cataract, bilateral: Secondary | ICD-10-CM | POA: Diagnosis not present

## 2020-04-10 DIAGNOSIS — M533 Sacrococcygeal disorders, not elsewhere classified: Secondary | ICD-10-CM

## 2020-04-10 DIAGNOSIS — H524 Presbyopia: Secondary | ICD-10-CM | POA: Diagnosis not present

## 2020-04-10 NOTE — Patient Instructions (Signed)
You had a radio frequency procedure today This was done to alleviate joint pain in your lumbar area We injected lidocaine which is a local anesthetic.  You may experience soreness at the injection sites. You may also experienced some irritation of the nerves that were heated I'm recommending ice for 30 minutes every 2 hours as needed for the next 24-48 hours   

## 2020-04-10 NOTE — Progress Notes (Signed)
  PROCEDURE RECORD [facility]   Name: Samantha Cox DOB:05-13-1955 MRN: [id number]  Date:04/10/2020  Physician: Claudette Laws, MD    Nurse/CMA: Terisa Belardo, CMA   Allergies:  Allergies  Allergen Reactions  . Crestor [Rosuvastatin] Other (See Comments)    Severe myalgia and back pain  . Augmentin [Amoxicillin-Pot Clavulanate] Diarrhea and Other (See Comments)    Felt very bad      Consent Signed: No.  Is patient diabetic? No.  CBG today?   Pregnant: No. LMP: No LMP recorded. Patient is postmenopausal. (age 2-55)  Anticoagulants: no Anti-inflammatory: yes (advil) Antibiotics: no  Procedure: left L5, S1-3 radiofrequency neurotomy  Position: Prone Start Time: 1:17pm  End Time: 1:33pm  Fluoro Time: 53s  RN/CMA Marsi Turvey, CMA Lailana Shira, CMA    Time 1:00PM 1:39pm    BP 116/79 124/84    Pulse 78 64    Respirations 16 16    O2 Sat 95 95    S/S 6 6    Pain Level 4/10 0/10     D/C home with self, patient A & O X 3, D/C instructions reviewed, and sits independently.

## 2020-04-10 NOTE — Progress Notes (Signed)
Sacroiliac radio frequency ablation under fluoroscopic guidance This consists of L5 dorsal ramus radio frequency ablation plus neuro lysis of S1-S2 -S3 lateral branches  Indication is sacroiliac pain which has improved temporarily onby at least 50% following sacroiliac intra-articular injection and L5 , S1,2,3 Lateral branch blocks under fluoroscopic guidance. Pain interferes with self-care and mobility and has failed to respond to conservative measures.  Informed consent was obtained after discussing risks and benefits of the procedure with the patient these include bleeding bruising and infection temporary or permanent paralysis. The patient elects to proceed and has given written consent.  Patient placed prone on fluoroscopy table. Area marked and prepped with Betadine. Fluoroscopic images utilized to guide needle. 25-gauge 1.5 inch needle was used to anesthetize 4 injection points with 2 cc of 1% lidocaine each. Then a 18-gauge 10 cm RF needle with a 10 mm curved active tip was inserted under fluoroscopic guidance first targeting the S1 SA P./sacral ala junction, bone contact made and confirmed with lateral imaging.  motor stimulation at 2 Hz confirm proper needle location followed by injection of one cc of a solution containing   1% lidocaine MPF. Radio frequency 80C for 80 seconds was performed. Then the inferolateral aspect of the S1, S2 and lateral aspect of S3 sacral foramina were targeted. Bone contact made.  motor stim at 2 Hz confirm proper needle location. One ML of the /lidocaine solution was injected into each of 3 sites and radio frequency ablation 80C for 90 seconds was performed. Patient tolerated procedure well. Post procedure instructions given 

## 2020-04-13 ENCOUNTER — Other Ambulatory Visit: Payer: Self-pay | Admitting: Cardiology

## 2020-04-13 DIAGNOSIS — E7800 Pure hypercholesterolemia, unspecified: Secondary | ICD-10-CM

## 2020-04-13 DIAGNOSIS — I2510 Atherosclerotic heart disease of native coronary artery without angina pectoris: Secondary | ICD-10-CM

## 2020-04-13 DIAGNOSIS — Z1211 Encounter for screening for malignant neoplasm of colon: Secondary | ICD-10-CM | POA: Diagnosis not present

## 2020-04-23 ENCOUNTER — Ambulatory Visit: Payer: BC Managed Care – PPO

## 2020-04-27 ENCOUNTER — Other Ambulatory Visit: Payer: Self-pay | Admitting: Family Medicine

## 2020-04-27 DIAGNOSIS — F17201 Nicotine dependence, unspecified, in remission: Secondary | ICD-10-CM

## 2020-04-30 ENCOUNTER — Ambulatory Visit: Payer: BC Managed Care – PPO

## 2020-04-30 DIAGNOSIS — K5730 Diverticulosis of large intestine without perforation or abscess without bleeding: Secondary | ICD-10-CM | POA: Diagnosis not present

## 2020-04-30 DIAGNOSIS — Z1211 Encounter for screening for malignant neoplasm of colon: Secondary | ICD-10-CM | POA: Diagnosis not present

## 2020-05-02 ENCOUNTER — Other Ambulatory Visit: Payer: Self-pay | Admitting: Family Medicine

## 2020-05-04 ENCOUNTER — Encounter: Payer: Self-pay | Admitting: Family Medicine

## 2020-05-04 DIAGNOSIS — M25519 Pain in unspecified shoulder: Secondary | ICD-10-CM

## 2020-05-04 DIAGNOSIS — M542 Cervicalgia: Secondary | ICD-10-CM

## 2020-05-04 NOTE — Telephone Encounter (Signed)
Please advise 

## 2020-05-20 ENCOUNTER — Ambulatory Visit: Payer: BC Managed Care – PPO

## 2020-05-21 ENCOUNTER — Ambulatory Visit: Payer: BC Managed Care – PPO | Admitting: Physical Therapy

## 2020-06-02 ENCOUNTER — Other Ambulatory Visit: Payer: Self-pay

## 2020-06-02 ENCOUNTER — Ambulatory Visit: Payer: BC Managed Care – PPO

## 2020-06-02 ENCOUNTER — Ambulatory Visit: Payer: BC Managed Care – PPO | Attending: Family Medicine

## 2020-06-02 DIAGNOSIS — M542 Cervicalgia: Secondary | ICD-10-CM

## 2020-06-02 DIAGNOSIS — R262 Difficulty in walking, not elsewhere classified: Secondary | ICD-10-CM | POA: Diagnosis not present

## 2020-06-02 DIAGNOSIS — M25551 Pain in right hip: Secondary | ICD-10-CM | POA: Diagnosis not present

## 2020-06-02 DIAGNOSIS — M546 Pain in thoracic spine: Secondary | ICD-10-CM

## 2020-06-02 DIAGNOSIS — M25652 Stiffness of left hip, not elsewhere classified: Secondary | ICD-10-CM

## 2020-06-02 DIAGNOSIS — M25552 Pain in left hip: Secondary | ICD-10-CM | POA: Diagnosis not present

## 2020-06-02 DIAGNOSIS — G8929 Other chronic pain: Secondary | ICD-10-CM | POA: Diagnosis not present

## 2020-06-02 DIAGNOSIS — M25511 Pain in right shoulder: Secondary | ICD-10-CM | POA: Diagnosis not present

## 2020-06-02 DIAGNOSIS — M25512 Pain in left shoulder: Secondary | ICD-10-CM | POA: Diagnosis not present

## 2020-06-02 DIAGNOSIS — M545 Low back pain, unspecified: Secondary | ICD-10-CM | POA: Diagnosis not present

## 2020-06-02 DIAGNOSIS — M25651 Stiffness of right hip, not elsewhere classified: Secondary | ICD-10-CM

## 2020-06-02 NOTE — Therapy (Signed)
[facility] [address] [place], [place], [id number] Phone: [phone]   Fax:  [phone]  Physical Therapy Treatment/Re-evaluation   Patient Details  Name: Samantha Cox MRN: [id number] Date of Birth: Jun 18, 1955 Referring Provider (PT): Myra RudeSchmitz, Jeremy E, MD   Encounter Date: 06/02/2020   PT End of Session - 06/02/20 1542    Visit Number 5    Number of Visits 15    Date for PT Re-Evaluation 07/31/20    Authorization Type BCBS - FOTO at visit 6 and visit 10    PT Start Time 1325   pt 10 min late   PT Stop Time 1405    PT Time Calculation (min) 40 min    Activity Tolerance Patient tolerated treatment well    Behavior [facility] Therapy [facility] for tasks assessed/performed           Past Medical History:  Diagnosis Date  . History of tobacco use   . Routine general medical examination at a health care facility   . Screening for lipoid disorders     Past Surgical History:  Procedure Laterality Date  . OTHER SURGICAL HISTORY     Birthmark removed from forehead 7th grade    There were no vitals filed for this visit.   Subjective Assessment - 06/02/20 1428    Subjective Pt reports she had her ablation and then was out of town a lot for a couple of months. "there should be an order for my neck and shoulder. I get knots around the corner of my inside shoulder blades. L<R shoulder blade pain." "The ablation was extremely painful, far more painful than the last time, don't think I'll do that again". The ablation was not as successful as the time before.    Pertinent History See PMH above    Limitations Sitting;Standing;Walking;Lifting;House hold activities    How long can you sit comfortably? "It hurts some all the time. It hurts the most when I get up after sitting."    How long can you stand comfortably? "Pretty quick before I start looking for a place to sit down. It's mostly the back that hurts"    How long can you walk  comfortably? "Probably a mile or mile and half"    Diagnostic tests 02/04/2020 [place] of R hip: Ultrasound inspection prior to injection reveals intact appearing hip abductor tendons however area of hyperechoic change near the distal tendon insertion gluteus medius tendon with an area of tenderness.  Indicates partial tear.    Patient Stated Goals "I want to be pain free"    Currently in Pain? Yes    Pain Score 3     Pain Location Back    Pain Orientation Lower    Pain Onset More than a month ago    Multiple Pain Sites Yes    Pain Score 1    Pain Location Scapula    Pain Orientation Medial;Lower    Pain Descriptors / Indicators Aching    Pain Radiating Towards "knots"    Pain Onset More than a month ago    Pain Frequency Intermittent    Pain Relieving Factors Massage    Pain Score 3    Pain Location Neck    Pain Orientation Right;Left    Pain Descriptors / Indicators Dull;Aching    Pain Type Chronic pain    Pain Onset More than a month ago    Pain Frequency Intermittent    Aggravating Factors  lying in bed reading  Pain Relieving Factors Advil              [facility] PT Assessment - 06/02/20 0001      Observation/Other Assessments   Focus on Therapeutic Outcomes (FOTO)  Neck 57% ability, Shoulder 72% ability      ROM / Strength   AROM / PROM / Strength AROM;Strength      AROM   AROM Assessment Site Cervical;Shoulder    Right/Left Shoulder Right;Left    Right Shoulder Flexion 132 Degrees    Right Shoulder ABduction 143 Degrees    Right Shoulder Internal Rotation --   T10   Right Shoulder External Rotation --   C7-T1   Left Shoulder Flexion 144 Degrees    Left Shoulder ABduction 132 Degrees    Left Shoulder Internal Rotation --   T9   Left Shoulder External Rotation --   T2   Cervical Flexion 42    Cervical Extension 55   hinging at axis   Cervical - Right Side Bend 25    Cervical - Left Side Bend 18    Cervical - Right Rotation 72    Cervical - Left Rotation 58       Strength   Strength Assessment Site Shoulder    Right/Left Shoulder Right;Left    Right Shoulder Flexion 4/5    Right Shoulder ABduction 4/5    Right Shoulder Internal Rotation 5/5    Right Shoulder External Rotation 4/5    Left Shoulder Flexion 4+/5    Left Shoulder ABduction 4+/5    Left Shoulder Internal Rotation 5/5    Left Shoulder External Rotation 4/5      Palpation   Palpation comment TTP B UT, LS, periscapular region      Special Tests    Special Tests Cervical    Cervical Tests Dictraction      Distraction Test   Findngs Positive    Comment decr pain                         [facility] Adult PT Treatment/Exercise - 06/02/20 0001      Self-Care   Self-Care Other Self-Care Comments    Other Self-Care Comments  See patient education      Exercises   Exercises Shoulder      Shoulder Exercises: Supine   Other Supine Exercises T/S ext over pink roller x5 at T6-8, x5 4-6      Shoulder Exercises: Sidelying   Other Sidelying Exercises B thoracic rotation with HBH x12                  PT Education - 06/02/20 1507    Education Details Importance of thoracic mobility to decrease neck, shoulder blade and low back pain; HEP addition, cervical posturing; FOTO    Person(s) Educated Patient    Methods Explanation;Demonstration;Tactile cues;Verbal cues;Handout    Comprehension Verbalized understanding;Returned demonstration;Verbal cues required;Tactile cues required            PT Short Term Goals - 06/02/20 1435      PT SHORT TERM GOAL #1   Title Pt will be independent with initial HEP.    Baseline Pt received initial HEP during evaluation 02/26/2020.    Time 3    Period Weeks    Status Achieved    Target Date 03/18/20      PT SHORT TERM GOAL #2   Title Pt will be able to perform resisted knee flexion with </= 2/10  hip pain.    Baseline no pain during resistance    Time 3    Period Weeks    Status Achieved    Target Date 03/18/20      PT  SHORT TERM GOAL #3   Title Pt will report being able to unload/load dishwasher with </= 3/10 pain.    Baseline anytime she bends over, it hurts    Time 3    Period Weeks    Status On-going    Target Date 03/18/20      PT SHORT TERM GOAL #4   Title Pt will demonstrate optimal body mechanics and ability to perform hip hinge with minimal to no cues for correction.    Baseline min cueing    Time 3    Period Weeks    Status On-going    Target Date 03/18/20             PT Long Term Goals - 06/02/20 1436      PT LONG TERM GOAL #1   Title Pt will be independent with advanced HEP.    Baseline Pt received initial HEP during evaluation 02/26/2020.    Time 8    Period Weeks    Status On-going    Target Date 07/31/20      PT LONG TERM GOAL #2   Title Pt will be able to perform resisted knee flexion with </= 1/10 hip pain.    Baseline NT today    Time 8    Period Weeks    Status On-going    Target Date 07/31/20      PT LONG TERM GOAL #3   Title Pt's FOTO score will increase from 60% to 67% functional status to demonstrate improved perceived ability.    Baseline 60% functional status; predicted 67% function -- added neck and shoulder today    Time 6    Period Weeks    Status New      PT LONG TERM GOAL #4   Title Pt will demonstrate 5/5 bilateral hip MMT to allow for improved functional strength.    Baseline --    Time 8    Period Weeks    Status On-going    Target Date 07/31/20      PT LONG TERM GOAL #5   Title Pt will increase neck AROM to [facility] with <2/10 pain during ADLs and leisurely reading and knowledge on how to reduce/abolish pain.    Baseline 5/10    Time 8    Period Weeks    Status New    Target Date 07/31/20      Additional Long Term Goals   Additional Long Term Goals Yes      PT LONG TERM GOAL #6   Title Pt will demonstrate 5/5 shoulder MMT without pain and ability to lift 25# from floor to waist and waist to OH.    Baseline 4/5 shoulder pain    Time 8     Period Weeks    Status New    Target Date 07/31/20      PT LONG TERM GOAL #7   Title Pt will increase shoulder FOTO ability to at least 73% ability and neck FOTO ability to at least 66%, in order to demonstrate meaningful change in perceived level of functional ability.    Baseline shoulder baseline 72% ability, neck baseline 57% ability    Time 8    Period Weeks    Status New    Target Date  07/31/20                 Plan - 06/02/20 1533    Clinical Impression Statement Pt returns to Physical Therapy after 2 months with new Rx to include neck and shoulder. Pt arrived 10 minutes late, so focused evaluation on neck and shoulder today. Pt's posture indicated increased thoracic kyphosis and decreased thoracic mobility, forward head posturing, and forward shoulder rounding. Pt feels relief of pain with cervical traction in sitting and manually depressing and retracting shoulder blades. Neck and periscapular pain appear to be coming from posture and thoracic stiffness. Added thoracic extension and rotation mobility to HEP. Pt could benefit from further skilled PT 1x/week for 6-8 more weeks to increase neck and shoulder AROM, shoulder and hip MMT, and decrease overall pain levels for improved QOL.    Personal Factors and Comorbidities Comorbidity 2;Age;Fitness;Past/Current Experience;Time since onset of injury/illness/exacerbation;Profession    Comorbidities asthma, urticaria    Examination-Activity Limitations Bend;Bed Mobility;Lift;Locomotion Level;Stairs;Squat;Stand    Examination-Participation Restrictions Cleaning;Community Activity;Occupation;Meal Prep    Stability/Clinical Decision Making Stable/Uncomplicated    Rehab Potential Good    PT Frequency 1x / week    PT Duration 8 weeks    PT Treatment/Interventions ADLs/Self Care Home Management;Aquatic Therapy;Cryotherapy;Electrical Stimulation;Iontophoresis 4mg /ml Dexamethasone;Moist Heat;Traction;Gait training;Stair training;Functional  mobility training;Therapeutic activities;Therapeutic exercise;Balance training;Neuromuscular re-education;Manual techniques;Patient/family education;Passive range of motion;Dry needling;Energy conservation;Taping    PT Next Visit Plan Continue hip strengthening/stability and lumbopelvic stabilization as tolerated pending pain, STM vs. foam roll lateral hip region, other manual/mobs as tolerated, hip stretches, foam roll thoracic spine, thoracic mobility, manual therapy/DN to neck as needed    PT Home Exercise Plan - HS stretch (supine with strap and/or seated), Thomas stretch, hip hinge, hip ABD, deadlift, monster walks (green), bird dog, [id number], dead bug    Consulted and Agree with Plan of Care Patient           Patient will benefit from skilled therapeutic intervention in order to improve the following deficits and impairments:  Decreased activity tolerance,Decreased balance,Decreased endurance,Decreased mobility,Decreased strength,Improper body mechanics,Postural dysfunction,Pain,Difficulty walking  Visit Diagnosis: Pain in left hip  Pain in right hip  Difficulty in walking, not elsewhere classified  Stiffness of left hip, not elsewhere classified  Stiffness of right hip, not elsewhere classified  Chronic bilateral low back pain without sciatica  Cervicalgia  Chronic left shoulder pain  Chronic right shoulder pain  Pain in thoracic spine     Problem List Patient Active Problem List   Diagnosis Date Noted  . Myalgia due to statin 12/25/2019  . Chronic left SI joint pain 07/31/2019  . Hamstring tendinitis at origin 07/31/2019  . Facet arthritis of lumbar region 03/05/2019  . Chronic rhinitis 12/19/2018  . Shortness of breath 12/19/2018  . Ex-cigarette smoker 12/19/2018  . Greater trochanteric bursitis of right hip 07/10/2018  . Degenerative lumbar spinal stenosis 10/17/2016  . Leg cramping 09/19/2016  . Morton's neuroma of left foot 05/24/2016  .  Greater trochanteric bursitis of left hip 11/25/2014  . Degenerative arthritis of hip 01/16/2014  . Acetabular labrum tear 01/16/2014    14/11/2013, PT, DPT 06/02/2020, 3:46 PM  [facility] [address] [place], [place], [place] Phone: [phone]   Fax:  [phone]  Name: BRYA SIMERLY MRN: Samantha Hoit Date of Birth: 1956-01-03

## 2020-06-02 NOTE — Patient Instructions (Signed)
Access Code: [email or web address] URL: [email or web address] Date: 06/02/2020 Prepared by: Gardiner Rhyme  Exercises Supine Hamstring Stretch with Strap - 1 x daily - 7 x weekly - 1 sets - 4 reps - 30s hold Seated Hamstring Stretch - 1 x daily - 7 x weekly - 1 sets - 4 reps - 30s hold Modified Thomas Stretch - 1 x daily - 7 x weekly - 1 sets - 4 reps - 30s hold Standing Hip Hinge with Dowel - 1-2 x daily - 7 x weekly - 1 sets - 10 reps Forward Monster Walks - 1 x daily - 7 x weekly - 3 sets - 10 reps Sidelying Hip Abduction - 1-2 x daily - 7 x weekly - 2 sets - 15 reps Standing Hip Abduction with Counter Support - 1-2 x daily - 7 x weekly - 2 sets - 15 reps Half Dead Lift with Kettlebell - 1-2 x daily - 7 x weekly - 2 sets - 15 reps Quadruped Hip Abduction and External Rotation - 1-2 x daily - 7 x weekly - 2 sets - 15 reps Bird Dog - 1-2 x daily - 7 x weekly - 2 sets - 15 reps Dead Bug - 1-2 x daily - 7 x weekly - 2 sets - 15 reps Thoracic Extension Mobilization on Foam Roll - 1 x daily - 7 x weekly - 2 sets - 5-10 reps Open Books - 1 x daily - 7 x weekly - 1-2 sets - 10-15 reps

## 2020-06-08 DIAGNOSIS — H0014 Chalazion left upper eyelid: Secondary | ICD-10-CM | POA: Diagnosis not present

## 2020-06-09 ENCOUNTER — Other Ambulatory Visit: Payer: Self-pay | Admitting: Family Medicine

## 2020-06-09 DIAGNOSIS — F17201 Nicotine dependence, unspecified, in remission: Secondary | ICD-10-CM

## 2020-06-11 ENCOUNTER — Ambulatory Visit: Payer: BC Managed Care – PPO

## 2020-06-11 ENCOUNTER — Ambulatory Visit (INDEPENDENT_AMBULATORY_CARE_PROVIDER_SITE_OTHER): Payer: BC Managed Care – PPO

## 2020-06-11 ENCOUNTER — Other Ambulatory Visit: Payer: Self-pay

## 2020-06-11 ENCOUNTER — Encounter: Payer: Self-pay | Admitting: Family Medicine

## 2020-06-11 ENCOUNTER — Ambulatory Visit (INDEPENDENT_AMBULATORY_CARE_PROVIDER_SITE_OTHER): Payer: BC Managed Care – PPO | Admitting: Family Medicine

## 2020-06-11 VITALS — BP 104/72 | HR 90 | Ht 66.0 in | Wt 176.6 lb

## 2020-06-11 DIAGNOSIS — M1612 Unilateral primary osteoarthritis, left hip: Secondary | ICD-10-CM | POA: Diagnosis not present

## 2020-06-11 DIAGNOSIS — M25552 Pain in left hip: Secondary | ICD-10-CM | POA: Diagnosis not present

## 2020-06-11 NOTE — Patient Instructions (Addendum)
Thank you for coming in today.  Plan for PT with dry needling.  If you need a different location [facility] on [address] is great  There is also a great option at [facility]  Let me know.   MRI is probably a good idea.   Please get an Xray today before you leave  Keep me updated.

## 2020-06-11 NOTE — Progress Notes (Signed)
I, Samantha Cox, LAT, ATC, am serving as scribe for Dr. Clementeen Cox.  Samantha Cox is a 65 y.o. female who presents to [facility] at [facility] today for continued R hip pain. Pt was last seen by Dr. Denyse Cox on 02/04/20 and was given a GT steroid injection and was advised to cont PT of which she's completed a total of 5 visits. Pt had a SI ablation of L5 dorsal ramus radio frequency ablation plus neuro lysis of S1-S2 -S3 lateral branches on 04/10/20. Today, pt reports that she is con't to have L-sided low back pain.  She states that her last ablation did not help and that she did not have a good experience w/ Dr. Jodean Cox.  She feels that this may be associated w/ gluteal tendinopathy like on the R side.  She had a repeat SI nerve ablation in March which was very painful and did not help very much.  She wonders if because she was in so much pain and moving around a lot during the procedure if she did not have an effective ablation and that is why its not working well.    Dx imaging:07/27/19 L-spine & R hip MRI  04/12/19 L-spine XR  Pertinent review of systems: No fevers or chills  Relevant historical information: Multiple different musculoskeletal problem history   Exam:  BP 104/72 (BP Location: Right Arm, Patient Position: Sitting, Cuff Size: Normal)   Pulse 90   Ht 5\' 6"  (1.676 m)   Wt 176 lb 9.6 oz (80.1 kg)   SpO2 96%   BMI 28.50 kg/m  General: Well Developed, well nourished, and in no acute distress.   MSK: Left hip normal. Tender palpation left SI joint.  Tender palpation along course of gluteus medius tendon and buttocks.  Hip motion is intact.  Hip abduction strength diminished.    Lab and Radiology Results  X-ray images AP pelvis obtained today personally independently interpreted SI joints relatively normal-appearing however there is a lucency at the left femoral acetabular joint through the posterior aspect of the acetabulum or through the femoral  head indicating a possible fracture.  However this is a 1 view AP pelvis therefore I cannot be certain of the exact structure involved. Await formal radiology review   Assessment and Plan: 65 y.o. female with left low back to posterior buttocks to the left posterior hip pain.  This is a chronic ongoing issue that we have historically been treating with bedside joint related procedures starting with a SI joint steroid/Marcaine injection that provided immediate pain relief.  She then had a good response to SI nerve ablation but then subsequent mediocre response to a SI joint ablation in March.  However during this procedure she was experienced a lot of pain during the procedure and was moving a lot and there is some concern that perhaps the ablation was incomplete because of motion during the procedure.  Current pain today may be related to hip abductor tendinopathy or SI joint related pain or the unexpected finding on the x-ray around the hip.  X-rays were obtained after patient left the visit.  She will likely require follow-up imaging either CT scan or MRI.  Await radiology overread.  Plan to proceed with physical therapy and likely imaging.   PDMP not reviewed this encounter. Orders Placed This Encounter  Procedures  . DG Pelvis 1-2 Views    Standing Status:   Future    Number of Occurrences:   1  Standing Expiration Date:   06/11/2021    Order Specific Question:   Reason for Exam (SYMPTOM  OR DIAGNOSIS REQUIRED)    Answer:   eval left si joint pain    Order Specific Question:   Preferred imaging location?    Answer:   [facility]   No orders of the defined types were placed in this encounter.    Discussed warning signs or symptoms. Please see discharge instructions. Patient expresses understanding.   The above documentation has been reviewed and is accurate and complete Samantha Cox, M.D.

## 2020-06-15 ENCOUNTER — Encounter: Payer: Self-pay | Admitting: Family Medicine

## 2020-06-15 NOTE — Progress Notes (Signed)
No acute fractures are seen in the pelvis.  Mild arthritis changes are present in the low back and left hip

## 2020-06-18 ENCOUNTER — Ambulatory Visit: Payer: BC Managed Care – PPO

## 2020-06-23 ENCOUNTER — Telehealth: Payer: Self-pay

## 2020-06-23 DIAGNOSIS — E559 Vitamin D deficiency, unspecified: Secondary | ICD-10-CM | POA: Diagnosis not present

## 2020-06-23 DIAGNOSIS — M5136 Other intervertebral disc degeneration, lumbar region: Secondary | ICD-10-CM | POA: Diagnosis not present

## 2020-06-23 DIAGNOSIS — J449 Chronic obstructive pulmonary disease, unspecified: Secondary | ICD-10-CM | POA: Diagnosis not present

## 2020-06-23 DIAGNOSIS — E785 Hyperlipidemia, unspecified: Secondary | ICD-10-CM | POA: Diagnosis not present

## 2020-06-23 NOTE — Telephone Encounter (Signed)
Pt called asking about her medications

## 2020-07-03 ENCOUNTER — Ambulatory Visit
Admission: RE | Admit: 2020-07-03 | Discharge: 2020-07-03 | Disposition: A | Discharge status: Home / Self Care | Payer: BC Managed Care – PPO | Source: Ambulatory Visit | Attending: Family Medicine | Admitting: Family Medicine

## 2020-07-03 ENCOUNTER — Other Ambulatory Visit: Payer: Self-pay

## 2020-07-03 DIAGNOSIS — F17201 Nicotine dependence, unspecified, in remission: Secondary | ICD-10-CM

## 2020-07-03 DIAGNOSIS — Z87891 Personal history of nicotine dependence: Secondary | ICD-10-CM | POA: Diagnosis not present

## 2020-07-04 ENCOUNTER — Encounter: Payer: BC Managed Care – PPO | Admitting: Physical Therapy

## 2020-07-07 ENCOUNTER — Other Ambulatory Visit: Payer: Self-pay | Admitting: Family Medicine

## 2020-07-07 NOTE — Telephone Encounter (Signed)
Rx refill request approved per Dr. Corey's orders. 

## 2020-07-10 DIAGNOSIS — M533 Sacrococcygeal disorders, not elsewhere classified: Secondary | ICD-10-CM | POA: Diagnosis not present

## 2020-07-10 DIAGNOSIS — M48061 Spinal stenosis, lumbar region without neurogenic claudication: Secondary | ICD-10-CM | POA: Diagnosis not present

## 2020-07-10 DIAGNOSIS — M545 Low back pain, unspecified: Secondary | ICD-10-CM | POA: Diagnosis not present

## 2020-07-18 ENCOUNTER — Ambulatory Visit: Payer: BC Managed Care – PPO | Attending: Family Medicine | Admitting: Physical Therapy

## 2020-07-18 ENCOUNTER — Encounter: Payer: Self-pay | Admitting: Physical Therapy

## 2020-07-18 ENCOUNTER — Other Ambulatory Visit: Payer: Self-pay

## 2020-07-18 DIAGNOSIS — M542 Cervicalgia: Secondary | ICD-10-CM | POA: Insufficient documentation

## 2020-07-18 DIAGNOSIS — G8929 Other chronic pain: Secondary | ICD-10-CM

## 2020-07-18 DIAGNOSIS — M545 Low back pain, unspecified: Secondary | ICD-10-CM | POA: Diagnosis not present

## 2020-07-18 DIAGNOSIS — M25512 Pain in left shoulder: Secondary | ICD-10-CM | POA: Diagnosis not present

## 2020-07-18 NOTE — Therapy (Signed)
[facility] [address] [place], [place], [address] Phone: [phone]   Fax:  [phone]  Physical Therapy Treatment/Re-evaluation  Patient Details  Name: Samantha Cox MRN: [id number] Date of Birth: [birth year] Referring Provider (PT): [name], MD   Encounter Date: 07/18/2020   PT End of Session - 07/18/20 1550     Visit Number 6    Number of Visits 12    Date for PT Re-Evaluation 08/29/20    Authorization Type [employer], recheck FOTO next session    PT Start Time 0956   pt. arrived 10 min late   PT Stop Time 1046    PT Time Calculation (min) 50 min    Activity Tolerance Patient tolerated treatment well    Behavior During Therapy [facility] for tasks assessed/performed             Past Medical History:  Diagnosis Date   History of tobacco use    Routine general medical examination at a health care facility    Screening for lipoid disorders     Past Surgical History:  Procedure Laterality Date   OTHER SURGICAL HISTORY     Birthmark removed from forehead 7th grade    There were no vitals filed for this visit.   Subjective Assessment - 07/18/20 1523     Subjective Pt. returns to therapy-not seen since 06/02/20. She reports that she did not get relief from last ablation procedure this past March with contnued SI pain. She also reports newer onset of LBP with radiating into bilateral LE worse on waking up in the morning and eased with movement though can be present with prolonged positioning ("pressure" with sitting) or other exacerbating activities. Leg pain is primarily in bilateral posterior thighs distal to knees but can be distal to feet. No saddle parasthesias noted. No bowel changes noted. She does report some urinary urgency at times but no instances of incontinence or difficulty emptying bladder. She reports that she saw Jason Coop, Dr. Ardyth Gal PA and given symptoms are worse in AM with associated stiffness  symptoms suspected as associated with arthritis. She was also referred to Dr. Regino Schultze for consultation re: possible injections or ablation for her SI pain. LBP 2-3/10 this AM. She has also been having continued neck pain radiating into left upper trapezius and periscapular region-she had benefited from dry needling previously for this region and wishes to try again today.                [facility] PT Assessment - 07/18/20 0001       Assessment   Medical Diagnosis Neck and shoulder pain    Referring Provider (PT) [name], MD    Onset Date/Surgical Date --   chronic, referral 05/05/20     Precautions   Precautions None      Restrictions   Weight Bearing Restrictions No      Balance Screen   Is the patient reluctant to leave their home because of a fear of falling?  No      Prior Function   Level of Independence Independent with community mobility without device;Independent with basic ADLs      Cognition   Overall Cognitive Status Within Functional Limits for tasks assessed      Sensation   Light Touch Impaired Detail    Light Touch Impaired Details Impaired LLE   decreased sensation to light touch at left L5 dermatome     Deep Tendon Reflexes   DTR Assessment Site  Patella;Achilles    Patella DTR 1+    Achilles DTR 2+      AROM   Overall AROM Comments Limited hip IR PROM and hip extension limited with hip flexor tightness, no overt directional preference for lumbar PROM for flexion and extension (in sidelying)    AROM Assessment Site Hip;Lumbar    Cervical Flexion 45    Cervical Extension 30    Cervical - Right Side Bend 36    Cervical - Left Side Bend 37    Cervical - Right Rotation 58    Cervical - Left Rotation 55    Lumbar Flexion 60    Lumbar Extension 30    Lumbar - Right Side Bend 20    Lumbar - Left Side Bend 25    Lumbar - Right Rotation 50%    Lumbar - Left Rotation 30%      Strength   Right Hip Flexion 5/5    Right Hip External Rotation  5/5    Right Hip  Internal Rotation 5/5    Left Hip Flexion 5/5    Left Hip External Rotation 5/5    Left Hip Internal Rotation 5/5    Right Knee Flexion 5/5    Right Knee Extension 5/5    Left Knee Flexion 5/5    Left Knee Extension 5/5    Right Ankle Dorsiflexion 5/5    Right Ankle Inversion 5/5    Right Ankle Eversion 5/5    Left Ankle Dorsiflexion 5/5    Left Ankle Inversion 5/5    Left Ankle Eversion 5/5      Flexibility   Soft Tissue Assessment /Muscle Length --   hamstring tightness with SLR 60 deg right, 70 deg left     Palpation   Spinal mobility hypomobile with mid to lower lumbar CPAs-localized pain but no concordant LE symptoms    Palpation comment TTP left upper trapezius and levator scapula, left infra and supraspinatus with associated trigger points      Special Tests   Other special tests Spurling's (-), Babinksi' test (-), Inverted supinator sign (-), clonus (-) bilat. ankles, SLR (-) bilat.                             Trigger Point Dry Needling - 07/18/20 0001     Consent Given? Yes    Education Handout Provided Previously provided    Muscles Treated Head and Neck Upper trapezius;Levator scapulae;Cervical multifidi   left side   Muscles Treated Upper Quadrant Infraspinatus;Supraspinatus;Rhomboids   left side   Dry Needling Comments needling to muscles as noted above in prone, "downhill" cephalically directed needling to rhomboid with shallow transverse insertion    Electrical Stimulation Performed with Dry Needling Yes    E-stim with Dry Needling Details TENS 2 pps x 10 minutes                  PT Education - 07/18/20 1546     Education Details cauda equina symptoms as red flag requiring medical attention, trial SKTC vs. DKTC for leg symptoms to see if this centralizes LE pain vs. if not helping or is peripheralizing symptoms to trial POE    Person(s) Educated Patient    Methods Explanation;Demonstration;Verbal cues    Comprehension Verbalized  understanding              PT Short Term Goals - 07/18/20 1606       PT SHORT  TERM GOAL #1   Title Pt will be independent with initial HEP.    Baseline Pt received initial HEP during evaluation 02/26/2020.    Time 3    Period Weeks    Status Achieved      PT SHORT TERM GOAL #2   Title Pt will be able to perform resisted knee flexion with </= 2/10 hip pain.    Baseline no pain during resistance    Time 3    Period Weeks    Status Achieved      PT SHORT TERM GOAL #3   Title Pt will report being able to unload/load dishwasher with </= 3/10 pain.    Baseline anytime she bends over, it hurts    Time 3    Status On-going    Target Date 08/08/20      PT SHORT TERM GOAL #4   Title Pt will demonstrate optimal body mechanics and ability to perform hip hinge with minimal to no cues for correction.    Baseline min cueing    Time 3    Period Weeks    Status On-going    Target Date 08/08/20               PT Long Term Goals - 07/18/20 1607       PT LONG TERM GOAL #1   Title Pt will be independent with advanced HEP.    Baseline ongoing    Time 6    Period Weeks    Status On-going    Target Date 08/29/20      PT LONG TERM GOAL #2   Title Pt will be able to perform resisted knee flexion with </= 1/10 hip pain.    Baseline NT today    Time 6    Period Weeks      PT LONG TERM GOAL #3   Title Pt's FOTO score will increase from 60% to 67% functional status to demonstrate improved perceived ability.    Baseline ongoing    Time 6    Period Weeks    Status On-going    Target Date 08/29/20      PT LONG TERM GOAL #4   Title Pt. will increase bilateral cervical rotation AROM to >/=60 deg to improve ability to turn head while driving    Baseline See flowsheet    Time 6    Period Weeks    Status New    Target Date 08/29/20      PT LONG TERM GOAL #5   Title Pt will increase neck AROM to [facility] with <2/10 pain during ADLs and leisurely reading and knowledge on how to  reduce/abolish pain.    Baseline ongoing    Time 6    Period Weeks    Status On-going      PT LONG TERM GOAL #6   Title Pt will demonstrate 5/5 shoulder MMT without pain and ability to lift 25# from floor to waist and waist to OH.    Baseline 4/5 shoulder pain    Time 6    Period Weeks    Status On-going    Target Date 08/29/20                   Plan - 07/18/20 1551     Clinical Impression Statement Pt. returns to physical therapy/not seen since 06/02/20. She continues with neck and left shoulder pain. Though some symptoms in left periscapular region, pain location is primarily in left upper  trapezius region-concordant pain on muscular palpation with trigger points so would suspect current symptoms primarily myofascial though known history underlying DDD. No distal UE symptoms and Spurling's (-) so low suspicion current radiculopathy. Regarding pt. report of bilateral LE radiating symptoms-no objective findings suggestive of myelopathy. No overt directional preference with trunk ROM and symptoms worse on arising in AM would be atypical for stenosis/neurogenic claudication. Pt. does have history of disc bulges, facet OA and moderate stenosis per MRI last year-would suspect AM symptoms could be facet/DDD related vs. unclear if potential bilat. LE radicular symptoms or SI referred pain but will continue to monitor. Pt. may be switching PT to another location soon but wished to seek therapist's opinion and will set of POC to try a few more visits if beneficial, otherwise pt. continues with doing Pilates exercise on her own for core stability.    Personal Factors and Comorbidities Comorbidity 2;Age;Fitness;Past/Current Experience;Time since onset of injury/illness/exacerbation;Profession    Comorbidities asthma, urticaria    Examination-Activity Limitations Bend;Bed Mobility;Lift;Locomotion Level;Stairs;Squat;Stand    Examination-Participation Restrictions Cleaning;Community  Activity;Occupation;Meal Prep    Stability/Clinical Decision Making Stable/Uncomplicated    Clinical Decision Making Low    Rehab Potential Good    PT Frequency 1x / week    PT Duration 6 weeks    PT Treatment/Interventions ADLs/Self Care Home Management;Cryotherapy;Electrical Stimulation;Iontophoresis 4mg /ml Dexamethasone;Moist Heat;Traction;Gait training;Stair training;Functional mobility training;Therapeutic activities;Therapeutic exercise;Balance training;Neuromuscular re-education;Manual techniques;Patient/family education;Passive range of motion;Dry needling;Taping    PT Next Visit Plan Continue dry needling as found benefical for cervical and left periscapular region, check response directional preference exercises discussed for LE radiating symptoms with trial SKTC vs. DKTC, further check spinal passive accessories for CPAs, UPAs for any concordant LE radiating symptoms    PT Home Exercise Plan - HS stretch (supine with strap and/or seated), Thomas stretch, hip hinge, hip ABD, deadlift, monster walks (green), bird dog, fire hydrant, dead bug    Consulted and Agree with Plan of Care Patient             Patient will benefit from skilled therapeutic intervention in order to improve the following deficits and impairments:  Decreased activity tolerance, Decreased balance, Decreased endurance, Decreased mobility, Decreased strength, Improper body mechanics, Postural dysfunction, Pain, Difficulty walking  Visit Diagnosis: Cervicalgia  Chronic left shoulder pain  Chronic bilateral low back pain without sciatica     Problem List Patient Active Problem List   Diagnosis Date Noted   Myalgia due to statin 12/25/2019   Chronic left SI joint pain 07/31/2019   Hamstring tendinitis at origin 07/31/2019   Facet arthritis of lumbar region 03/05/2019   Chronic rhinitis 12/19/2018   Shortness of breath 12/19/2018   Ex-cigarette smoker 12/19/2018   Greater trochanteric bursitis of  right hip 07/10/2018   Degenerative lumbar spinal stenosis 10/17/2016   Leg cramping 09/19/2016   Morton's neuroma of left foot 05/24/2016   Greater trochanteric bursitis of left hip 11/25/2014   Degenerative arthritis of hip 01/16/2014   Acetabular labrum tear 01/16/2014    14/11/2013, PT, DPT 07/18/20 4:14 PM   [facility] [address] [place], [place], [place] Phone: [phone]   Fax:  [phone]  Name: Samantha Cox MRN: Bernadette Hoit Date of Birth: 1956/01/04

## 2020-07-20 DIAGNOSIS — M533 Sacrococcygeal disorders, not elsewhere classified: Secondary | ICD-10-CM | POA: Diagnosis not present

## 2020-07-20 DIAGNOSIS — M48061 Spinal stenosis, lumbar region without neurogenic claudication: Secondary | ICD-10-CM | POA: Diagnosis not present

## 2020-07-20 DIAGNOSIS — M5416 Radiculopathy, lumbar region: Secondary | ICD-10-CM | POA: Diagnosis not present

## 2020-07-28 ENCOUNTER — Other Ambulatory Visit: Payer: Self-pay | Admitting: Cardiology

## 2020-07-28 DIAGNOSIS — E7800 Pure hypercholesterolemia, unspecified: Secondary | ICD-10-CM

## 2020-07-28 DIAGNOSIS — I2510 Atherosclerotic heart disease of native coronary artery without angina pectoris: Secondary | ICD-10-CM

## 2020-08-06 ENCOUNTER — Ambulatory Visit: Payer: BC Managed Care – PPO | Admitting: Family Medicine

## 2020-08-06 NOTE — Progress Notes (Deleted)
   I, Samantha Cox, LAT, ATC, am serving as scribe for Dr. [name].  Samantha Cox is a 65 y.o. female who presents to [facility] at [facility] today for f/u of chronic R hip pain and LBP.  She was last seen by Dr. Denyse Cox on 06/11/20 and noted more L-sided LBP.  Pt has had a variety of prior treatments including R GT injection on 02/04/20 and SI ablation of L5 dorsal ramus radio frequency ablation plus neurolysis of S1-S2 -S3 lateral branches on 04/10/20.  She was advised to con't PT and has completed a total of 6 PT sessions.  Since her last visit, pt reports  Dx imaging: Pelvis XR- 06/11/20; L-spine & R hip MRI - 07/27/19; L-spine XR - 04/12/19  Pertinent review of systems: ***  Relevant historical information: ***   Exam:  There were no vitals taken for this visit. General: Well Developed, well nourished, and in no acute distress.   MSK: ***    Lab and Radiology Results No results found for this or any previous visit (from the past 72 hour(s)). No results found.     Assessment and Plan: 65 y.o. female with ***   PDMP not reviewed this encounter. No orders of the defined types were placed in this encounter.  No orders of the defined types were placed in this encounter.    Discussed warning signs or symptoms. Please see discharge instructions. Patient expresses understanding.   ***

## 2020-08-11 DIAGNOSIS — U071 COVID-19: Secondary | ICD-10-CM | POA: Diagnosis not present

## 2020-08-13 ENCOUNTER — Ambulatory Visit: Payer: BC Managed Care – PPO | Admitting: Family Medicine

## 2020-08-18 DIAGNOSIS — M48062 Spinal stenosis, lumbar region with neurogenic claudication: Secondary | ICD-10-CM | POA: Diagnosis not present

## 2020-08-22 ENCOUNTER — Ambulatory Visit: Payer: BC Managed Care – PPO | Admitting: Physical Therapy

## 2020-08-24 ENCOUNTER — Other Ambulatory Visit: Payer: Self-pay | Admitting: Family Medicine

## 2020-08-24 NOTE — Telephone Encounter (Signed)
Rx refill request approved per Dr. Corey's orders. 

## 2020-08-27 DIAGNOSIS — E785 Hyperlipidemia, unspecified: Secondary | ICD-10-CM | POA: Diagnosis not present

## 2020-08-27 DIAGNOSIS — Z23 Encounter for immunization: Secondary | ICD-10-CM | POA: Diagnosis not present

## 2020-08-27 DIAGNOSIS — E559 Vitamin D deficiency, unspecified: Secondary | ICD-10-CM | POA: Diagnosis not present

## 2020-09-01 DIAGNOSIS — R079 Chest pain, unspecified: Secondary | ICD-10-CM | POA: Insufficient documentation

## 2020-09-01 DIAGNOSIS — Z1211 Encounter for screening for malignant neoplasm of colon: Secondary | ICD-10-CM | POA: Insufficient documentation

## 2020-09-01 DIAGNOSIS — J449 Chronic obstructive pulmonary disease, unspecified: Secondary | ICD-10-CM | POA: Diagnosis not present

## 2020-09-01 DIAGNOSIS — M1612 Unilateral primary osteoarthritis, left hip: Secondary | ICD-10-CM | POA: Diagnosis not present

## 2020-09-01 DIAGNOSIS — Z0000 Encounter for general adult medical examination without abnormal findings: Secondary | ICD-10-CM | POA: Diagnosis not present

## 2020-09-01 DIAGNOSIS — M24159 Other articular cartilage disorders, unspecified hip: Secondary | ICD-10-CM | POA: Diagnosis not present

## 2020-09-01 DIAGNOSIS — K5730 Diverticulosis of large intestine without perforation or abscess without bleeding: Secondary | ICD-10-CM | POA: Insufficient documentation

## 2020-09-01 DIAGNOSIS — E7849 Other hyperlipidemia: Secondary | ICD-10-CM | POA: Diagnosis not present

## 2020-09-03 ENCOUNTER — Other Ambulatory Visit: Payer: Self-pay | Admitting: Family Medicine

## 2020-09-03 DIAGNOSIS — Z1231 Encounter for screening mammogram for malignant neoplasm of breast: Secondary | ICD-10-CM

## 2020-09-10 ENCOUNTER — Encounter: Payer: BC Managed Care – PPO | Admitting: Physical Medicine & Rehabilitation

## 2020-09-11 DIAGNOSIS — M48061 Spinal stenosis, lumbar region without neurogenic claudication: Secondary | ICD-10-CM | POA: Diagnosis not present

## 2020-09-11 DIAGNOSIS — M533 Sacrococcygeal disorders, not elsewhere classified: Secondary | ICD-10-CM | POA: Diagnosis not present

## 2020-09-21 ENCOUNTER — Ambulatory Visit
Admission: RE | Admit: 2020-09-21 | Discharge: 2020-09-21 | Disposition: A | Discharge status: Home / Self Care | Payer: BC Managed Care – PPO | Source: Ambulatory Visit

## 2020-09-21 ENCOUNTER — Other Ambulatory Visit: Payer: Self-pay

## 2020-09-21 DIAGNOSIS — Z1231 Encounter for screening mammogram for malignant neoplasm of breast: Secondary | ICD-10-CM | POA: Diagnosis not present

## 2020-09-29 ENCOUNTER — Other Ambulatory Visit: Payer: Self-pay | Admitting: Family Medicine

## 2020-09-29 DIAGNOSIS — M533 Sacrococcygeal disorders, not elsewhere classified: Secondary | ICD-10-CM | POA: Diagnosis not present

## 2020-10-03 NOTE — Therapy (Signed)
[place], [place], [address] Phone: [phone]   Fax:  [phone]  Physical Therapy Treatment/Discharge  Patient Details  Name: Samantha Cox MRN: [id number] Date of Birth: 1955/06/30 Referring Provider (PT): Lynne Leader, MD   Encounter Date: 07/18/2020    Past Medical History:  Diagnosis Date   History of tobacco use    Routine general medical examination at a health care facility    Screening for lipoid disorders     Past Surgical History:  Procedure Laterality Date   OTHER SURGICAL HISTORY     Birthmark removed from forehead 7th grade    There were no vitals filed for this visit.                                PT Short Term Goals - 07/18/20 1606       PT SHORT TERM GOAL #1   Title Pt will be independent with initial HEP.    Baseline Pt received initial HEP during evaluation 02/26/2020.    Time 3    Period Weeks    Status Achieved      PT SHORT TERM GOAL #2   Title Pt will be able to perform resisted knee flexion with </= 2/10 hip pain.    Baseline no pain during resistance    Time 3    Period Weeks    Status Achieved      PT SHORT TERM GOAL #3   Title Pt will report being able to unload/load dishwasher with </= 3/10 pain.    Baseline anytime she bends over, it hurts    Time 3    Status On-going    Target Date 08/08/20      PT SHORT TERM GOAL #4   Title Pt will demonstrate optimal body mechanics and ability to perform hip hinge with minimal to no cues for correction.    Baseline min cueing    Time 3    Period Weeks    Status On-going    Target Date 08/08/20               PT Long Term Goals - 07/18/20 1607       PT LONG TERM GOAL #1   Title Pt will be independent with advanced HEP.    Baseline ongoing    Time 6    Period Weeks    Status On-going    Target Date 08/29/20      PT LONG TERM GOAL #2   Title Pt will be able to  perform resisted knee flexion with </= 1/10 hip pain.    Baseline NT today    Time 6    Period Weeks      PT LONG TERM GOAL #3   Title Pt's FOTO score will increase from 60% to 67% functional status to demonstrate improved perceived ability.    Baseline ongoing    Time 6    Period Weeks    Status On-going    Target Date 08/29/20      PT LONG TERM GOAL #4   Title Pt. will increase bilateral cervical rotation AROM to >/=60 deg to improve ability to turn head while driving    Baseline See flowsheet    Time 6    Period Weeks    Status New    Target Date 08/29/20      PT LONG TERM GOAL #5  Title Pt will increase neck AROM to [facility] with <2/10 pain during ADLs and leisurely reading and knowledge on how to reduce/abolish pain.    Baseline ongoing    Time 6    Period Weeks    Status On-going      PT LONG TERM GOAL #6   Title Pt will demonstrate 5/5 shoulder MMT without pain and ability to lift 25# from floor to waist and waist to OH.    Baseline 4/5 shoulder pain    Time 6    Period Weeks    Status On-going    Target Date 08/29/20                    Patient will benefit from skilled therapeutic intervention in order to improve the following deficits and impairments:  Decreased activity tolerance, Decreased balance, Decreased endurance, Decreased mobility, Decreased strength, Improper body mechanics, Postural dysfunction, Pain, Difficulty walking  Visit Diagnosis: Cervicalgia - Plan: PT plan of care cert/re-cert  Chronic left shoulder pain - Plan: PT plan of care cert/re-cert  Chronic bilateral low back pain without sciatica - Plan: PT plan of care cert/re-cert     Problem List Patient Active Problem List   Diagnosis Date Noted   Myalgia due to statin 12/25/2019   Chronic left SI joint pain 07/31/2019   Hamstring tendinitis at origin 07/31/2019   Facet arthritis of lumbar region 03/05/2019   Chronic rhinitis 12/19/2018   Shortness of breath 12/19/2018    Ex-cigarette smoker 12/19/2018   Greater trochanteric bursitis of right hip 07/10/2018   Degenerative lumbar spinal stenosis 10/17/2016   Leg cramping 09/19/2016   Morton's neuroma of left foot 05/24/2016   Greater trochanteric bursitis of left hip 11/25/2014   Degenerative arthritis of hip 01/16/2014   Acetabular labrum tear 01/16/2014       PHYSICAL THERAPY DISCHARGE SUMMARY  Visits from Start of Care: 6  Current functional level related to goals / functional outcomes: No further therapy attended after last session 07/18/20   Remaining deficits: Current status unknown/no further PT visits after 07/18/20   Education / Equipment: Previous HEP instruction    Patient agrees to discharge. Patient goals were not met. Patient is being discharged due to not returning since the last visit.   Beaulah Dinning, PT, DPT 10/03/20 11:12 AM       [facility] [address] [place], [place], [address] Phone: [phone]   Fax:  [phone]  Name: AXELLE SZWED MRN: [id number] Date of Birth: Aug 23, 1955

## 2020-10-26 DIAGNOSIS — Z8742 Personal history of other diseases of the female genital tract: Secondary | ICD-10-CM | POA: Diagnosis not present

## 2020-10-26 DIAGNOSIS — M533 Sacrococcygeal disorders, not elsewhere classified: Secondary | ICD-10-CM | POA: Diagnosis not present

## 2020-10-26 DIAGNOSIS — M7071 Other bursitis of hip, right hip: Secondary | ICD-10-CM | POA: Diagnosis not present

## 2020-10-26 DIAGNOSIS — Z01419 Encounter for gynecological examination (general) (routine) without abnormal findings: Secondary | ICD-10-CM | POA: Diagnosis not present

## 2020-11-10 DIAGNOSIS — M7071 Other bursitis of hip, right hip: Secondary | ICD-10-CM | POA: Diagnosis not present

## 2020-12-26 IMAGING — MR MR HIP*R* W/O CM
4 of 5 series · 26 of 40 positions shown · non-contrast
Comparison: None.

CLINICAL DATA: Chronic hip pain.  Possible trochanteric bursitis.

EXAM:
MR OF THE RIGHT HIP WITHOUT CONTRAST
TECHNIQUE: Multiplanar, multisequence MR imaging was performed. No intravenous
contrast was administered.

[Series 3: T1 · coronal · 4.0mm · 0.85mm/px · 11 of 35 slices shown]
[im 1/35]
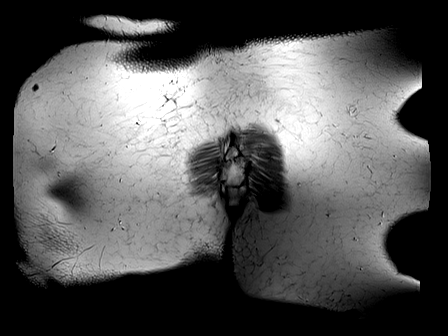
[im 4/35]
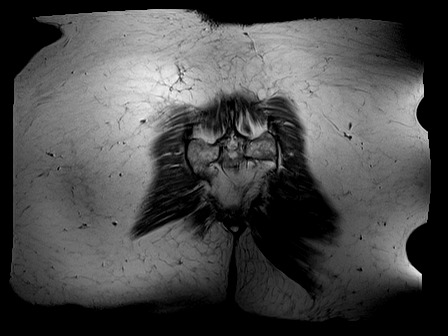
[im 7/35]
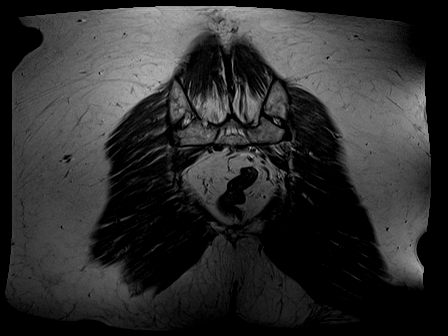
[im 11/35]
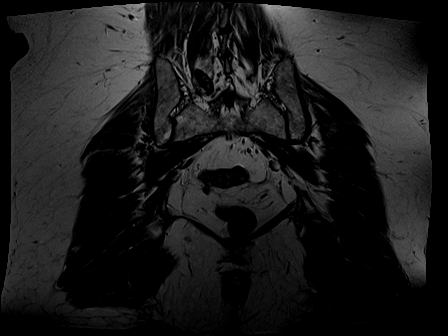
[im 14/35]
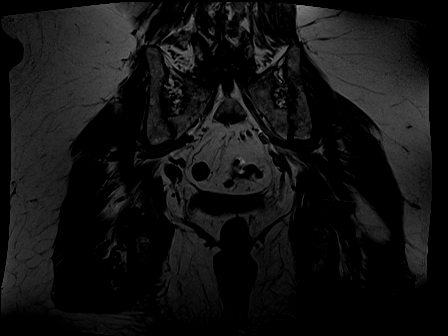
[im 18/35]
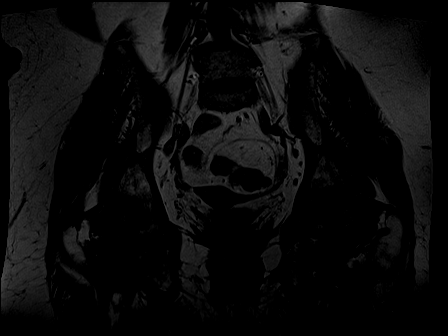
[im 21/35]
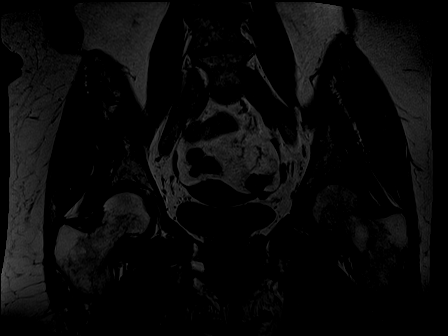
[im 24/35]
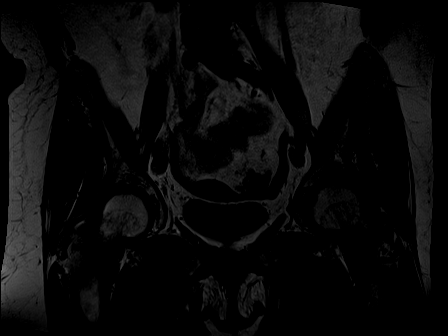
[im 28/35]
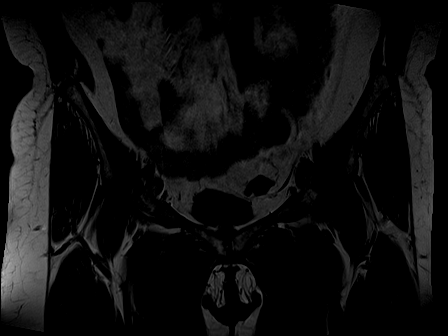
[im 31/35]
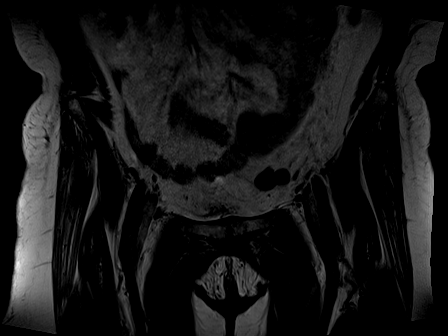
[im 35/35]
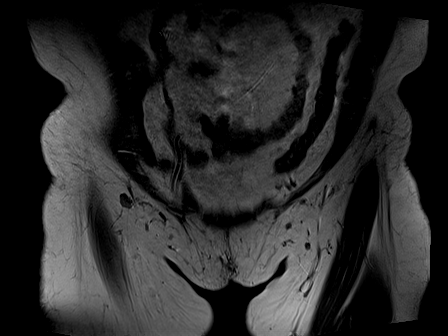

[Series 4: STIR · coronal · 4.0mm · 1.19mm/px · 3 of 33 slices shown]
[im 5/33]
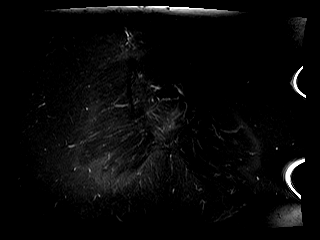
[im 17/33]
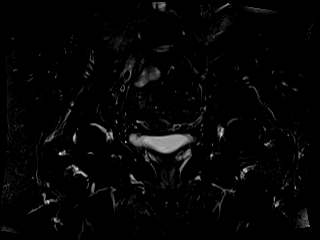
[im 29/33]
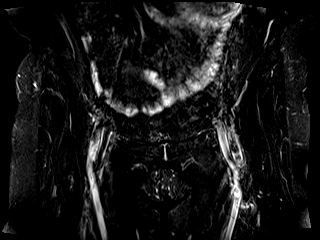

[Series 6: PD fat-sat · sagittal · 4.5mm · 0.35mm/px · 6 of 23 slices shown (1 of 2)]
[im 1/23]
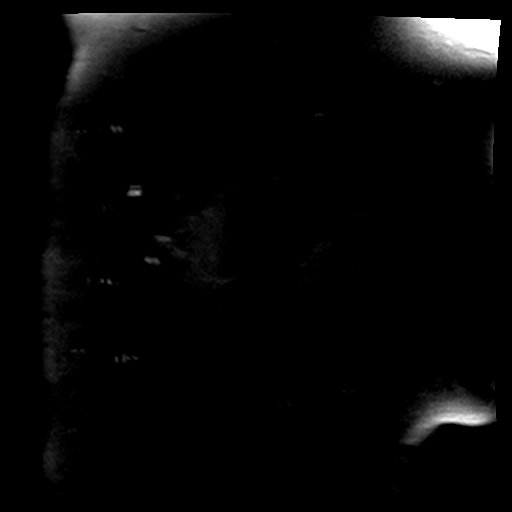
[im 5/23]
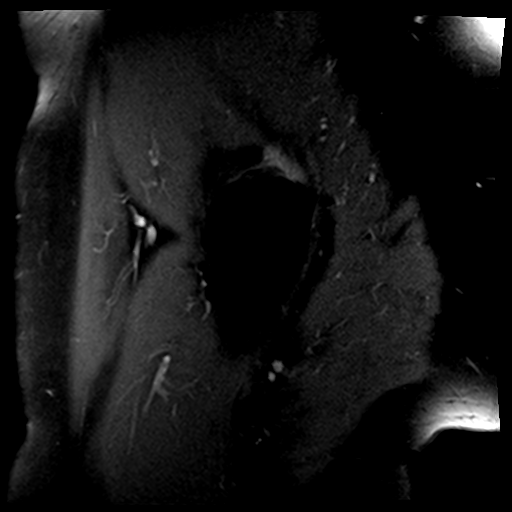
[im 9/23]
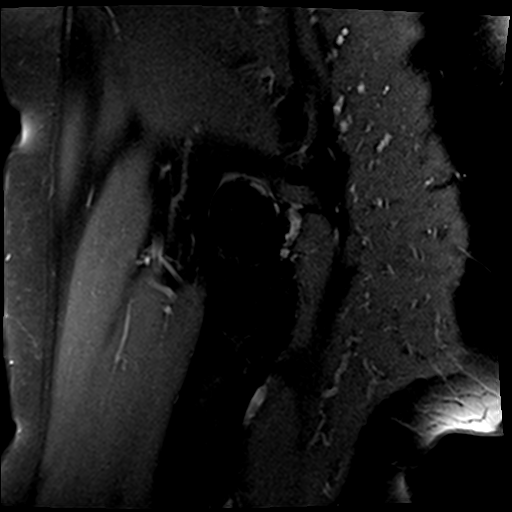
[im 14/23]
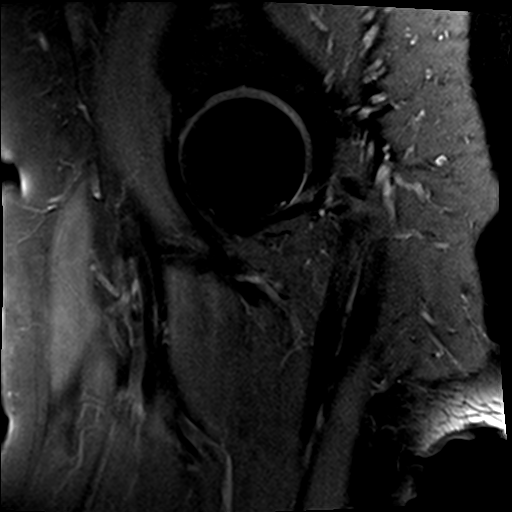
[im 18/23]
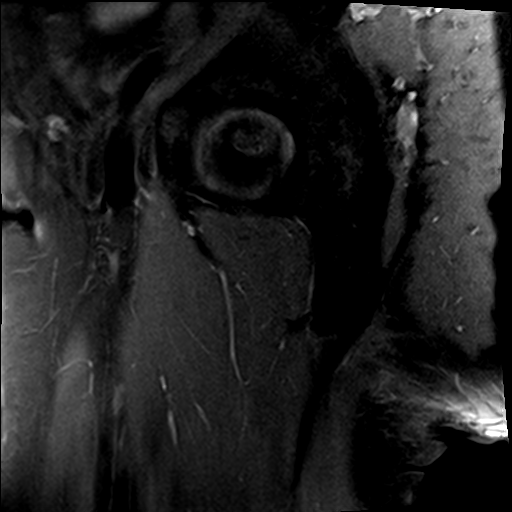
[im 23/23]
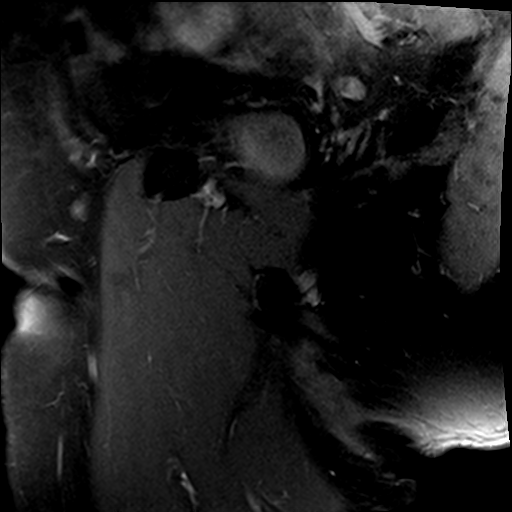

[Series 7: PD fat-sat · coronal · 4.5mm · 0.35mm/px · 6 of 23 slices shown (2 of 2)]
[im 1/23]
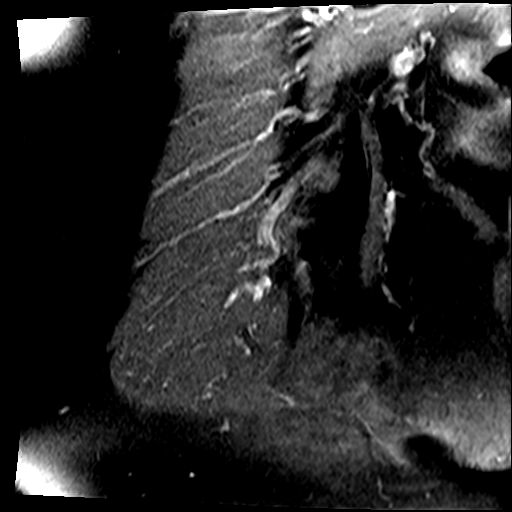
[im 5/23]
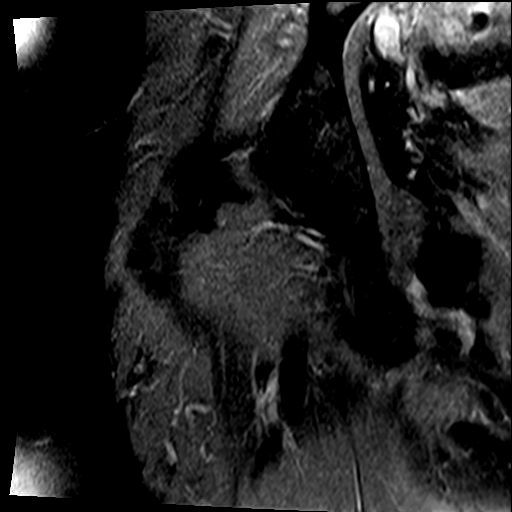
[im 9/23]
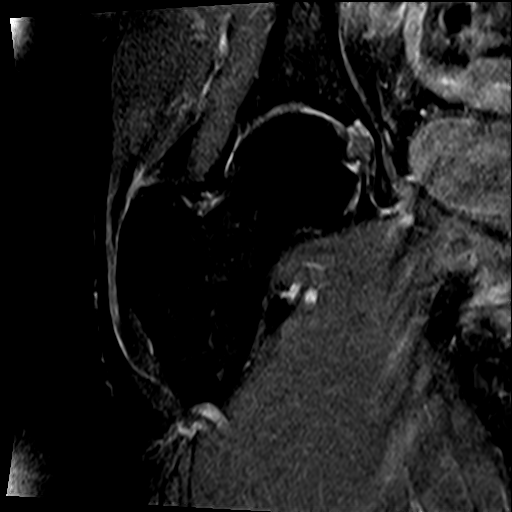
[im 14/23]
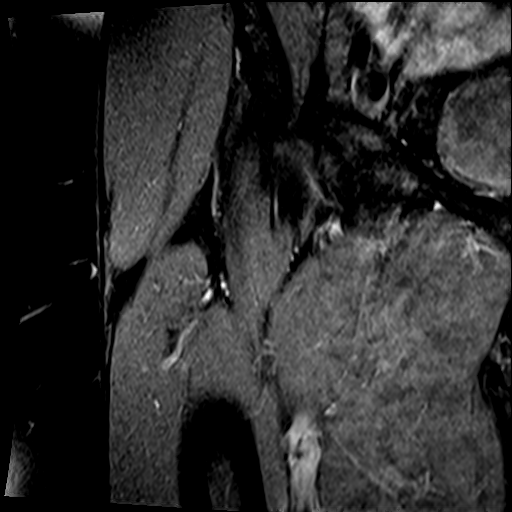
[im 18/23]
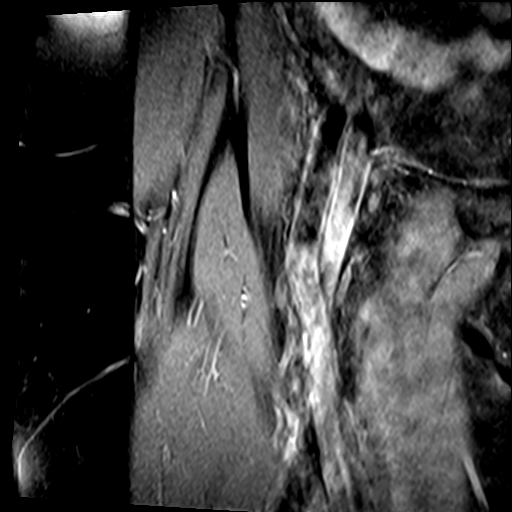
[im 23/23]
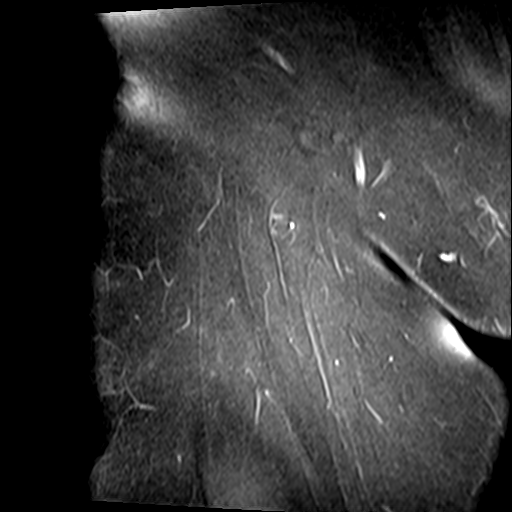

[26 of 40 positions shown; findings below may reference images not displayed]

FINDINGS: Bones: No acute fracture. No dislocation. No femoral head avascular
necrosis. No focal bone marrow edema. No suspicious bone lesion.
Moderate arthropathy of the pubic symphysis. Minimal arthropathy of
the bilateral SI joints without evidence of sacroiliitis. Partially
visualized lower lumbar spondylosis, see dedicated same day lumbar
spine MRI.

Articular cartilage and labrum

Articular cartilage: No chondral defect. No subchondral marrow
signal changes.

Labrum: Suspect low-grade partial thickness superior labral tear
(series 7, image 9), suboptimally evaluated on non arthrographic
imaging.

Joint or bursal effusion

Joint effusion:  No hip joint effusion.

Bursae: Mild right peritrochanteric bursal edema without focal
bursal fluid collection.

Muscles and tendons

Muscles and tendons: Mild tendinosis of the distal gluteus medius
tendon (series 7, image 7) without evidence of tear. Tendinosis with
low-grade tear at the right hamstring tendon origin (series 5, image
17). Remaining tendinous structures about the hip are intact.

Other findings

Miscellaneous:   Colonic diverticulosis.
IMPRESSION: 1. No acute osseous abnormality of the right hip.
2. Mild tendinosis of the gluteus medius tendon with associated mild
right peritrochanteric bursal edema.
3. Tendinosis with low-grade tear at the right hamstring tendon
origin.
4. Suspect low-grade partial thickness superior labral tear.
5. Colonic diverticulosis.

## 2020-12-29 DIAGNOSIS — M533 Sacrococcygeal disorders, not elsewhere classified: Secondary | ICD-10-CM | POA: Diagnosis not present

## 2021-01-04 DIAGNOSIS — D1801 Hemangioma of skin and subcutaneous tissue: Secondary | ICD-10-CM | POA: Diagnosis not present

## 2021-01-04 DIAGNOSIS — L814 Other melanin hyperpigmentation: Secondary | ICD-10-CM | POA: Diagnosis not present

## 2021-01-04 DIAGNOSIS — L578 Other skin changes due to chronic exposure to nonionizing radiation: Secondary | ICD-10-CM | POA: Diagnosis not present

## 2021-01-04 DIAGNOSIS — L821 Other seborrheic keratosis: Secondary | ICD-10-CM | POA: Diagnosis not present

## 2021-01-18 ENCOUNTER — Other Ambulatory Visit: Payer: Self-pay | Admitting: Cardiology

## 2021-01-18 DIAGNOSIS — I2510 Atherosclerotic heart disease of native coronary artery without angina pectoris: Secondary | ICD-10-CM

## 2021-01-18 DIAGNOSIS — E7800 Pure hypercholesterolemia, unspecified: Secondary | ICD-10-CM

## 2021-02-17 DIAGNOSIS — Z131 Encounter for screening for diabetes mellitus: Secondary | ICD-10-CM | POA: Diagnosis not present

## 2021-02-17 DIAGNOSIS — J449 Chronic obstructive pulmonary disease, unspecified: Secondary | ICD-10-CM | POA: Diagnosis not present

## 2021-02-17 DIAGNOSIS — E7849 Other hyperlipidemia: Secondary | ICD-10-CM | POA: Diagnosis not present

## 2021-02-17 DIAGNOSIS — M1612 Unilateral primary osteoarthritis, left hip: Secondary | ICD-10-CM | POA: Diagnosis not present

## 2021-02-17 DIAGNOSIS — Z789 Other specified health status: Secondary | ICD-10-CM | POA: Diagnosis not present

## 2021-03-03 DIAGNOSIS — G8929 Other chronic pain: Secondary | ICD-10-CM | POA: Diagnosis not present

## 2021-03-03 DIAGNOSIS — M5442 Lumbago with sciatica, left side: Secondary | ICD-10-CM | POA: Diagnosis not present

## 2021-03-03 DIAGNOSIS — M48062 Spinal stenosis, lumbar region with neurogenic claudication: Secondary | ICD-10-CM | POA: Diagnosis not present

## 2021-03-03 DIAGNOSIS — M5441 Lumbago with sciatica, right side: Secondary | ICD-10-CM | POA: Diagnosis not present

## 2021-03-08 DIAGNOSIS — J438 Other emphysema: Secondary | ICD-10-CM | POA: Diagnosis not present

## 2021-03-08 DIAGNOSIS — R0602 Shortness of breath: Secondary | ICD-10-CM | POA: Diagnosis not present

## 2021-03-12 DIAGNOSIS — M5442 Lumbago with sciatica, left side: Secondary | ICD-10-CM | POA: Diagnosis not present

## 2021-03-12 DIAGNOSIS — M48062 Spinal stenosis, lumbar region with neurogenic claudication: Secondary | ICD-10-CM | POA: Diagnosis not present

## 2021-03-12 DIAGNOSIS — M5441 Lumbago with sciatica, right side: Secondary | ICD-10-CM | POA: Diagnosis not present

## 2021-03-25 ENCOUNTER — Ambulatory Visit: Payer: BC Managed Care – PPO | Admitting: Cardiology

## 2021-03-29 ENCOUNTER — Other Ambulatory Visit: Payer: Self-pay | Admitting: Pulmonary Disease

## 2021-03-29 ENCOUNTER — Ambulatory Visit: Payer: BC Managed Care – PPO | Admitting: Cardiology

## 2021-03-29 ENCOUNTER — Other Ambulatory Visit: Payer: Self-pay

## 2021-03-29 ENCOUNTER — Encounter: Payer: Self-pay | Admitting: Cardiology

## 2021-03-29 VITALS — BP 116/76 | HR 80 | Temp 98.3°F | Resp 17 | Ht 66.0 in | Wt 171.8 lb

## 2021-03-29 DIAGNOSIS — I2510 Atherosclerotic heart disease of native coronary artery without angina pectoris: Secondary | ICD-10-CM | POA: Diagnosis not present

## 2021-03-29 DIAGNOSIS — R918 Other nonspecific abnormal finding of lung field: Secondary | ICD-10-CM

## 2021-03-29 DIAGNOSIS — E7800 Pure hypercholesterolemia, unspecified: Secondary | ICD-10-CM | POA: Diagnosis not present

## 2021-03-29 DIAGNOSIS — I2584 Coronary atherosclerosis due to calcified coronary lesion: Secondary | ICD-10-CM | POA: Diagnosis not present

## 2021-03-29 DIAGNOSIS — J432 Centrilobular emphysema: Secondary | ICD-10-CM

## 2021-03-29 MED ORDER — ASPIRIN EC 81 MG PO TBEC: 81.0000 mg | DELAYED_RELEASE_TABLET | Freq: Every day | ORAL | 3 refills | Status: DC

## 2021-03-29 MED ORDER — PITAVASTATIN CALCIUM 2 MG PO TABS: 1.0000 | ORAL_TABLET | Freq: Every day | ORAL | 2 refills | Status: DC

## 2021-03-29 NOTE — Progress Notes (Signed)
Primary Physician/Referring:  Leonel Ramsay, MD  Patient ID: Samantha Cox, female    DOB: 08-30-1955, 66 y.o.   MRN: [id number]  Chief Complaint  Patient presents with   Follow-up    2 YEAR   MED MANAGMENT   HPI:    Samantha Cox  is a 66 y.o. Caucasian female with 40+-pack-year history of smoking quit in 2015, lobular and paraseptal emphysema/COPD, coronary calcification noted on the CT scan of the chest, hyperlipidemia and GERD presents for annual visit, in view of coronary calcification and started on a statin therapy with improvement in LDL.   Patient states that she is doing well and she has not had any further episodes of chest pain or dyspnea, her physical activity is limited due to back pain.  She has been concerned about statins as she was having severe arthralgia but wanted to be back on it and wanted my opinion.  She also discontinued aspirin.   Past Medical History:  Diagnosis Date   History of tobacco use    Routine general medical examination at a health care facility    Screening for lipoid disorders    Past Surgical History:  Procedure Laterality Date   OTHER SURGICAL HISTORY     Birthmark removed from forehead 7th grade   Social History   Tobacco Use   Smoking status: Former    Packs/day: 1.00    Years: 40.00    Pack years: 40.00    Types: Cigarettes    Quit date: 2015    Years since quitting: 8.1   Smokeless tobacco: Never  Substance Use Topics   Alcohol use: Yes    Alcohol/week: 1.0 standard drink    Types: 1 Glasses of wine per week    Comment: occ   ROS  Review of Systems  Cardiovascular:  Negative for chest pain, dyspnea on exertion and leg swelling.  Objective  Blood pressure 116/76, pulse 80, temperature 98.3 F (36.8 C), temperature source Temporal, resp. rate 17, height $RemoveBe'5\' 6"'PQnxRVBqr$  (1.676 m), weight 171 lb 12.8 oz (77.9 kg), SpO2 98 %.  Vitals with BMI 03/29/2021 06/11/2020 04/10/2020  Height $Remov'5\' 6"'exBOKH$  $Remove'5\' 6"'UQHmPFF$  $RemoveB'5\' 6"'hhyMUsnm$   Weight 171 lbs 13 oz  176 lbs 10 oz 172 lbs  BMI 27.74 56.81 27.51  Systolic 700 174 944  Diastolic 76 72 79  Pulse 80 90 76     Physical Exam Cardiovascular:     Rate and Rhythm: Normal rate and regular rhythm.     Pulses: Intact distal pulses.     Heart sounds: Normal heart sounds. No murmur heard.   No gallop.     Comments: No leg edema, no JVD. Pulmonary:     Effort: Pulmonary effort is normal.     Breath sounds: Normal breath sounds.  Abdominal:     General: Bowel sounds are normal.     Palpations: Abdomen is soft.   Laboratory examination:  External labs:   Labs 08/27/2020:  TSH normal at 1.0 vitamin D 40.9.  Total cholesterol 191, triglycerides 125, HDL 77, LDL 92.  Non-HDL cholesterol 114. Marland Kitchen  Hb 14.7/HCT 43.7, platelets 208.  Normal indicis.  Serum glucose 95 mg, BUN 26, creatinine 0.76, EGFR 88 mL, potassium 4.1.  LFTs normal.  Labs 01/31/2019: Cholesterol, total 256 Triglycerides 113 HDL 86 LDL 151  NHDL 176 Hemoglobin 14.5 Creatinine, Serum 0.61 Potassium 4.700 01/31/2019 ALT (SGPT) 30  TSH 1.190 01/31/2019  Medications and allergies   Allergies  Allergen Reactions  Crestor [Rosuvastatin] Other (See Comments)    Severe myalgia and back pain   Simvastatin Other (See Comments)    Arthralgia   Augmentin [Amoxicillin-Pot Clavulanate] Diarrhea and Other (See Comments)    Felt very bad      Current Outpatient Medications  Medication Instructions   acetaminophen (TYLENOL) 1,300 mg, Oral, Daily PRN   ANORO ELLIPTA 62.5-25 MCG/INH AEPB 1 puff, Inhalation, Daily   aspirin EC 81 mg, Oral, Daily   calcium carbonate (TUMS - DOSED IN MG ELEMENTAL CALCIUM) 500 MG chewable tablet 1 tablet, Oral, As needed   ibuprofen (ADVIL) 600 mg, Oral, 2 times daily   methocarbamol (ROBAXIN) 500 mg, Oral, Every 6 hours PRN   Multiple Vitamin (MULTI-VITAMIN) tablet Oral   Pitavastatin Calcium 2 mg, Oral, Daily   Turmeric POWD See admin instructions   Vitamin D (Ergocalciferol) (DRISDOL) 50,000  Units, Oral, Every 7 days   Radiology:   Low-dose CT chest for lung cancer screening 02/14/2018: Cardiovascular: Heart size is normal. There is no significant pericardial fluid, thickening or pericardial calcification. No atherosclerotic calcifications in the thoracic aorta or the coronary arteries. Lungs/Pleura: No suspicious appearing pulmonary nodules or masses. No acute consolidative airspace disease. No pleural effusions. Mild diffuse bronchial wall thickening with mild centrilobular and paraseptal emphysema.  Cardiac Studies:   Echocardiogram 03/08/2019:  Left ventricle cavity is normal in size and wall thickness. Normal global  wall motion. Normal LV systolic function with EF 55%. Normal diastolic filling pattern.  Mild (Grade I) mitral regurgitation.  Mild tricuspid regurgitation. Estimated pulmonary artery systolic pressure  is 22 mmHg.  Lexiscan (Walking with mod Bruce)Tetrofosmin Stress Test  03/18/2019: Nondiagnostic ECG stress. Normal myocardial perfusion. All segments of left ventricle demonstrated normal wall motion and thickening.  Stress LV EF is normal 63%.  No previous exam available for comparison. Low risk study.   EKG  EKG 03/29/2021: Normal sinus rhythm at rate of 80 bpm, incomplete right bundle branch block.  No significant change from 01/16/2019.  Assessment     ICD-10-CM   1. Coronary artery calcification  I25.10 EKG 12-Lead   I25.84 Pitavastatin Calcium 2 MG TABS    aspirin EC 81 MG tablet    2. Hypercholesteremia  E78.00 Lipid Panel With LDL/HDL Ratio    Pitavastatin Calcium 2 MG TABS    Lipid Panel With LDL/HDL Ratio    3. Centrilobular emphysema ([place])  J43.2       Meds ordered this encounter  Medications   Pitavastatin Calcium 2 MG TABS    Sig: Take 1 tablet (2 mg total) by mouth daily.    Dispense:  30 tablet    Refill:  2   aspirin EC 81 MG tablet    Sig: Take 1 tablet (81 mg total) by mouth daily.    Dispense:  90 tablet     Refill:  3    Medications Discontinued During This Encounter  Medication Reason   ezetimibe-simvastatin (VYTORIN) 10-10 MG tablet    baclofen (LIORESAL) 10 MG tablet    azelastine (ASTELIN) 0.1 % nasal spray    albuterol (VENTOLIN HFA) 108 (90 Base) MCG/ACT inhaler    methocarbamol (ROBAXIN) 500 MG tablet    Melatonin 3 MG TABS    Magnesium 250 MG TABS    Fluticasone Propionate (XHANCE) 93 MCG/ACT EXHU    aspirin EC 81 MG tablet Reorder   Recommendations:   Samantha Cox  is a 66 y.o. Caucasian female with 40+-pack-year history of smoking quit in 2015,  lobular and paraseptal emphysema/COPD, coronary calcification noted on the CT scan of the chest, hyperlipidemia and GERD presents for annual visit, in view of coronary calcification and started on a statin therapy with improvement in LDL.  Patient is now statin intolerant with severe arthralgia to both very small dose of simvastatin/Zetia combination and also to Crestor.  Would like to try Livalo 2 mg, she can start with 1 mg daily and if she tolerates this to increase it to 2 mg daily and will recheck lipids after she has been on a stable dose.  She will obtain fasting lipids now.  Otherwise physical examination is unremarkable, she denies dyspnea, currently her main activity limitation is due to back pain, but she still continues to ride horses and continues to remain fairly active doing her household activities.  I will see her back in 3 months and if stable, on a as needed basis.  Restart aspirin 81 mg daily.   Adrian Prows, MD, [facility] 03/29/2021, 11:01 AM Office: [phone] Fax: [phone] Pager: [phone]

## 2021-03-30 DIAGNOSIS — G8929 Other chronic pain: Secondary | ICD-10-CM | POA: Diagnosis not present

## 2021-03-30 DIAGNOSIS — M7918 Myalgia, other site: Secondary | ICD-10-CM | POA: Diagnosis not present

## 2021-03-30 DIAGNOSIS — M5442 Lumbago with sciatica, left side: Secondary | ICD-10-CM | POA: Diagnosis not present

## 2021-03-30 DIAGNOSIS — M5441 Lumbago with sciatica, right side: Secondary | ICD-10-CM | POA: Diagnosis not present

## 2021-03-30 DIAGNOSIS — M48062 Spinal stenosis, lumbar region with neurogenic claudication: Secondary | ICD-10-CM | POA: Diagnosis not present

## 2021-04-01 DIAGNOSIS — M5459 Other low back pain: Secondary | ICD-10-CM | POA: Diagnosis not present

## 2021-04-01 DIAGNOSIS — M25551 Pain in right hip: Secondary | ICD-10-CM | POA: Diagnosis not present

## 2021-04-01 DIAGNOSIS — M25552 Pain in left hip: Secondary | ICD-10-CM | POA: Diagnosis not present

## 2021-04-01 DIAGNOSIS — M6281 Muscle weakness (generalized): Secondary | ICD-10-CM | POA: Diagnosis not present

## 2021-04-05 DIAGNOSIS — M25552 Pain in left hip: Secondary | ICD-10-CM | POA: Diagnosis not present

## 2021-04-05 DIAGNOSIS — M25551 Pain in right hip: Secondary | ICD-10-CM | POA: Diagnosis not present

## 2021-04-05 DIAGNOSIS — M6281 Muscle weakness (generalized): Secondary | ICD-10-CM | POA: Diagnosis not present

## 2021-04-05 DIAGNOSIS — M5459 Other low back pain: Secondary | ICD-10-CM | POA: Diagnosis not present

## 2021-04-07 ENCOUNTER — Other Ambulatory Visit: Payer: Self-pay | Admitting: Pulmonary Disease

## 2021-04-07 DIAGNOSIS — R918 Other nonspecific abnormal finding of lung field: Secondary | ICD-10-CM

## 2021-04-14 DIAGNOSIS — M5459 Other low back pain: Secondary | ICD-10-CM | POA: Diagnosis not present

## 2021-04-14 DIAGNOSIS — M25552 Pain in left hip: Secondary | ICD-10-CM | POA: Diagnosis not present

## 2021-04-14 DIAGNOSIS — M25551 Pain in right hip: Secondary | ICD-10-CM | POA: Diagnosis not present

## 2021-04-14 DIAGNOSIS — M6281 Muscle weakness (generalized): Secondary | ICD-10-CM | POA: Diagnosis not present

## 2021-04-14 DIAGNOSIS — E7800 Pure hypercholesterolemia, unspecified: Secondary | ICD-10-CM | POA: Diagnosis not present

## 2021-04-15 LAB — LIPID PANEL WITH LDL/HDL RATIO
Cholesterol, Total: 255 mg/dL — ABNORMAL HIGH (ref 100–199)
HDL: 87 mg/dL (ref >39)
LDL Chol Calc (NIH): 151 mg/dL — ABNORMAL HIGH (ref 0–99)
LDL/HDL Ratio: 1.7 ratio (ref 0.0–3.2)
Triglycerides: 99 mg/dL (ref 0–149)
VLDL Cholesterol Cal: 17 mg/dL (ref 5–40)

## 2021-04-20 ENCOUNTER — Other Ambulatory Visit: Payer: Self-pay

## 2021-04-20 ENCOUNTER — Encounter: Payer: Self-pay | Admitting: Cardiology

## 2021-04-20 ENCOUNTER — Telehealth: Payer: Self-pay

## 2021-04-20 DIAGNOSIS — I2510 Atherosclerotic heart disease of native coronary artery without angina pectoris: Secondary | ICD-10-CM

## 2021-04-20 DIAGNOSIS — M25552 Pain in left hip: Secondary | ICD-10-CM | POA: Diagnosis not present

## 2021-04-20 DIAGNOSIS — M5459 Other low back pain: Secondary | ICD-10-CM | POA: Diagnosis not present

## 2021-04-20 DIAGNOSIS — E7800 Pure hypercholesterolemia, unspecified: Secondary | ICD-10-CM

## 2021-04-20 DIAGNOSIS — M25551 Pain in right hip: Secondary | ICD-10-CM | POA: Diagnosis not present

## 2021-04-20 DIAGNOSIS — M6281 Muscle weakness (generalized): Secondary | ICD-10-CM | POA: Diagnosis not present

## 2021-04-20 MED ORDER — PITAVASTATIN CALCIUM 2 MG PO TABS: 1.0000 | ORAL_TABLET | Freq: Every day | ORAL | 2 refills | Status: DC

## 2021-04-20 NOTE — Telephone Encounter (Signed)
She has tried Simvastatin, crestor also. She has remotely tried Atorvastatin

## 2021-04-23 ENCOUNTER — Other Ambulatory Visit: Payer: Self-pay

## 2021-04-26 DIAGNOSIS — M25552 Pain in left hip: Secondary | ICD-10-CM | POA: Diagnosis not present

## 2021-04-26 DIAGNOSIS — M25551 Pain in right hip: Secondary | ICD-10-CM | POA: Diagnosis not present

## 2021-04-26 DIAGNOSIS — M6281 Muscle weakness (generalized): Secondary | ICD-10-CM | POA: Diagnosis not present

## 2021-04-26 DIAGNOSIS — M5459 Other low back pain: Secondary | ICD-10-CM | POA: Diagnosis not present

## 2021-04-30 ENCOUNTER — Other Ambulatory Visit: Payer: BC Managed Care – PPO

## 2021-04-30 ENCOUNTER — Other Ambulatory Visit: Payer: Self-pay | Admitting: Pulmonary Disease

## 2021-04-30 DIAGNOSIS — R918 Other nonspecific abnormal finding of lung field: Secondary | ICD-10-CM

## 2021-05-03 ENCOUNTER — Other Ambulatory Visit: Payer: BC Managed Care – PPO

## 2021-05-03 DIAGNOSIS — M25552 Pain in left hip: Secondary | ICD-10-CM | POA: Diagnosis not present

## 2021-05-03 DIAGNOSIS — M25551 Pain in right hip: Secondary | ICD-10-CM | POA: Diagnosis not present

## 2021-05-03 DIAGNOSIS — M5459 Other low back pain: Secondary | ICD-10-CM | POA: Diagnosis not present

## 2021-05-03 DIAGNOSIS — M6281 Muscle weakness (generalized): Secondary | ICD-10-CM | POA: Diagnosis not present

## 2021-05-18 DIAGNOSIS — M6281 Muscle weakness (generalized): Secondary | ICD-10-CM | POA: Diagnosis not present

## 2021-05-18 DIAGNOSIS — M25552 Pain in left hip: Secondary | ICD-10-CM | POA: Diagnosis not present

## 2021-05-18 DIAGNOSIS — M25551 Pain in right hip: Secondary | ICD-10-CM | POA: Diagnosis not present

## 2021-05-18 DIAGNOSIS — M5459 Other low back pain: Secondary | ICD-10-CM | POA: Diagnosis not present

## 2021-05-24 ENCOUNTER — Inpatient Hospital Stay: Admission: RE | Admit: 2021-05-24 | Payer: BC Managed Care – PPO | Source: Ambulatory Visit

## 2021-05-25 DIAGNOSIS — M5459 Other low back pain: Secondary | ICD-10-CM | POA: Diagnosis not present

## 2021-05-25 DIAGNOSIS — M6281 Muscle weakness (generalized): Secondary | ICD-10-CM | POA: Diagnosis not present

## 2021-05-25 DIAGNOSIS — M25552 Pain in left hip: Secondary | ICD-10-CM | POA: Diagnosis not present

## 2021-05-25 DIAGNOSIS — M25551 Pain in right hip: Secondary | ICD-10-CM | POA: Diagnosis not present

## 2021-05-30 ENCOUNTER — Other Ambulatory Visit: Payer: Self-pay | Admitting: Cardiology

## 2021-05-30 ENCOUNTER — Encounter: Payer: Self-pay | Admitting: Cardiology

## 2021-05-30 DIAGNOSIS — F5101 Primary insomnia: Secondary | ICD-10-CM | POA: Insufficient documentation

## 2021-05-30 DIAGNOSIS — M899 Disorder of bone, unspecified: Secondary | ICD-10-CM | POA: Insufficient documentation

## 2021-05-30 DIAGNOSIS — I700 Atherosclerosis of aorta: Secondary | ICD-10-CM | POA: Insufficient documentation

## 2021-05-30 DIAGNOSIS — R87810 Cervical high risk human papillomavirus (HPV) DNA test positive: Secondary | ICD-10-CM | POA: Insufficient documentation

## 2021-05-30 DIAGNOSIS — J449 Chronic obstructive pulmonary disease, unspecified: Secondary | ICD-10-CM | POA: Insufficient documentation

## 2021-05-30 DIAGNOSIS — A630 Anogenital (venereal) warts: Secondary | ICD-10-CM | POA: Insufficient documentation

## 2021-05-30 DIAGNOSIS — E785 Hyperlipidemia, unspecified: Secondary | ICD-10-CM | POA: Insufficient documentation

## 2021-05-30 DIAGNOSIS — Z87410 Personal history of cervical dysplasia: Secondary | ICD-10-CM | POA: Insufficient documentation

## 2021-05-30 DIAGNOSIS — N870 Mild cervical dysplasia: Secondary | ICD-10-CM | POA: Insufficient documentation

## 2021-05-30 DIAGNOSIS — E559 Vitamin D deficiency, unspecified: Secondary | ICD-10-CM | POA: Insufficient documentation

## 2021-05-30 DIAGNOSIS — M5136 Other intervertebral disc degeneration, lumbar region: Secondary | ICD-10-CM | POA: Insufficient documentation

## 2021-05-30 DIAGNOSIS — Z789 Other specified health status: Secondary | ICD-10-CM | POA: Insufficient documentation

## 2021-05-30 DIAGNOSIS — Z79899 Other long term (current) drug therapy: Secondary | ICD-10-CM | POA: Insufficient documentation

## 2021-05-30 DIAGNOSIS — F17201 Nicotine dependence, unspecified, in remission: Secondary | ICD-10-CM | POA: Insufficient documentation

## 2021-05-30 DIAGNOSIS — G894 Chronic pain syndrome: Secondary | ICD-10-CM | POA: Insufficient documentation

## 2021-05-30 DIAGNOSIS — N3946 Mixed incontinence: Secondary | ICD-10-CM | POA: Insufficient documentation

## 2021-05-30 DIAGNOSIS — E7800 Pure hypercholesterolemia, unspecified: Secondary | ICD-10-CM

## 2021-05-30 DIAGNOSIS — N882 Stricture and stenosis of cervix uteri: Secondary | ICD-10-CM | POA: Insufficient documentation

## 2021-05-30 DIAGNOSIS — J301 Allergic rhinitis due to pollen: Secondary | ICD-10-CM | POA: Insufficient documentation

## 2021-05-30 DIAGNOSIS — N3281 Overactive bladder: Secondary | ICD-10-CM | POA: Insufficient documentation

## 2021-05-30 NOTE — Progress Notes (Addendum)
Patient: Samantha Cox  Service Category: E/M  Provider: Gaspar Cola, MD  DOB: 1955-02-09  DOS: 05/31/2021  Referring Provider: Leonel Ramsay, MD  MRN: [id number]  Setting: Ambulatory outpatient  PCP: Leonel Ramsay, MD  Type: New Patient  Specialty: Interventional Pain Management    Location: Office  Delivery: Face-to-face     Primary Reason(s) for Visit: Encounter for initial evaluation of one or more chronic problems (new to examiner) potentially causing chronic pain, and posing a threat to normal musculoskeletal function. (Level of risk: High) CC: Back Pain and Neck Pain (Mid, goes up to head and causes headache. Shoulders bilateral)  HPI  Samantha Cox is a 66 y.o. year old, female patient, who comes for the first time to our practice referred by Leonel Ramsay, MD for our initial evaluation of her chronic pain. She has Degenerative arthritis of hip; Greater trochanteric bursitis of hip (Left); Piriformis syndrome of left side; Morton's neuroma of foot (Left); Leg cramping; Degenerative lumbar spinal stenosis; Greater trochanteric bursitis of hip (Right); Rhinitis, chronic; Ex-cigarette smoker; Facet arthritis of lumbar region; Chronic SI joint pain (Left); Hamstring tendinitis at origin; Myalgia due to statin; Chronic nasal congestion; Fall from horse 4/14.; Allergic rhinitis due to pollen; Anogenital warts; Bilateral hearing loss; Cervical high risk HPV (human papillomavirus) test positive; BPPV (benign paroxysmal positional vertigo), unspecified laterality; Cervical intraepithelial neoplasia grade 1; Chronic back pain; Chronic obstructive pulmonary disease, unspecified ([place]); Degeneration of lumbar intervertebral disc; Disequilibrium; Diverticular disease of colon; Dyslipidemia; Enthesopathy of hip region (Right); Hardening of the aorta (main artery of the heart) ([place]); History of cervical dysplasia; Mixed incontinence; Overactive bladder; Metatarsalgia of foot (Left);  Pain in foot (Left); Primary insomnia; Screening for malignant neoplasm of colon; Stenosis of cervix; Tinnitus, bilateral; Tobacco dependence in remission; Vitamin D deficiency; Chronic maxillary sinusitis; Plantar fasciitis of foot (Left); Chest pain; Chronic pain syndrome; Pharmacologic therapy; Disorder of skeletal system; Problems influencing health status; Abnormal MRI, lumbar spine (07/28/2019); Cervicalgia (4th area of Pain); Chronic low back pain (1ry area of Pain) (Bilateral) w/o sciatica; Lumbar facet syndrome (Bilateral); Lumbosacral facet hypertrophy (Multilevel) (Bilateral); Chronic lower extremity pain (3ry area of Pain) (nonradicular) (Bilateral); Cervicogenic headache (6th area of Pain); Chronic shoulder pain (5th area of Pain) (Bilateral); Use of cannabinoid edibles (CBD); Chronic hip pain (2ry area of Pain) (Bilateral) (R>L); DDD (degenerative disc disease), lumbar; Osteoarthritis of acromioclavicular joints (Bilateral); Spondylosis of lumbar spine (Multilevel); Lumbar lateral recess stenosis (Left: L2-3, L3-4) (Right: L2-3, L4-5); Lumbar foraminal stenosis (Left: L2-3, L3-4, L4-5) (Right: L3-4, L4-5); Dextroscoliosis of lumbar spine (apex at L3-4); Grade 1 Anterolisthesis of lumbar spine (L4/L5) (5 mm); Osteoarthritis of hips (Bilateral) (L>R); Tendinopathy of gluteus medius (Right); Proximal hamstring tendon low-grade tear, sequela (Right); Superior labral tear (low-grade, partial-thickness) of hip joint, sequela (Right); Spondylosis without myelopathy or radiculopathy, lumbosacral region; Chronic musculoskeletal pain; and Other spondylosis, sacral and sacrococcygeal region on their problem list. Today she comes in for evaluation of her Back Pain and Neck Pain (Mid, goes up to head and causes headache. Shoulders bilateral)  Pain Assessment: Location: Lower, Right, Left Back Radiating: [place] bilateral down back of leg  above knee Onset: More than a month ago Duration: Chronic  pain Quality: Aching, Discomfort, Constant Severity: 3 /10 (subjective, self-reported pain score)  Effect on ADL: stairs, prolonged walking and standing Timing: Constant Modifying factors: rest, posture BP: (!) 120/92  HR: 72  Onset and Duration: Present longer than 3 months Cause of pain: Arthritis Severity: NAS-11 at  its worse: 6/10, NAS-11 at its best: 1/10, NAS-11 now: 3/10, and NAS-11 on the average: 3/10 Timing: After a period of immobility Aggravating Factors: Bending, Lifiting, Prolonged sitting, and Prolonged standing Alleviating Factors: Stretching, Warm showers or baths, and physical therapy Associated Problems: Night-time cramps, Dizziness, Fatigue, Spasms, Swelling, Tingling, Pain that wakes patient up, and Pain that does not allow patient to sleep Quality of Pain: Aching, Annoying, Constant, Intermittent, Cramping, Distressing, Dreadful, Nagging, Sharp, Stabbing, Tiring, and Uncomfortable Previous Examinations or Tests: MRI scan Previous Treatments: Epidural steroid injections, Facet blocks, Physical Therapy, Pool exercises, and Stretching exercises  According to the patient the primary area of pain is that of the lower back (Bilateral) (R>L). The patient denies any prior surgeries but she does admit having had some recent x-rays/MRI at the [facility].  The patient is also currently undergoing physical therapy once a week which she describes as being "very helpful".  She refers [facility] [place]".  The patient also indicates having had prior nerve blocks which she also describes as being helpful.  Furthermore, the patient describes the low back pain has been debilitating.  The patient's secondary area pain is that of the hips (Bilateral) (R>L). She is but she does admit having had some x-rays (she refers not being sure about the this).  She denies any joint injections or nerve blocks for the hip pain.  The patient's  third area pain is that of the lower extremities (Bilateral) (L>R). Indicates of the left lower extremity she has pain that goes all the way down to the posterior aspect of the knee and occasionally he will go down to her big toe (L5).  She refers that this is not usual.  She also refers having been told that she has a Morton's neuroma in her left foot.  In the case of the right lower extremity the pain runs through the back of the leg to the level of the knee but it does not go any further from there.  In both instances she denies any numbness or weakness affecting her feet.  She is currently having physical therapy not only for her back pain but also for her lower extremity symptoms.  The symptoms seem to be none radicular.  The patient denies any surgeries of the lower extremities.  The patient's fourth area pain is that of the neck in the posterior aspect (Bilateral) (L>R).  She denies any recent x-rays but she also refers that this neck pain is associated with "paroxysmal dizziness" (cervicogenic vertigo).  She refers her neck pain to occasionally be referred to the back of the head as well as the shoulders.  The patient denies any surgeries over the cervical spine.  The patient's fifth area pain is that of the shoulders (Bilateral) (L>R).  She denies any upper extremity symptoms.  No pain, numbness, or weakness going down the arms.  The patient denies any surgeries of the shoulders.  The patient's sixth area pain is that of cervicogenic headaches where she indicates the pain to be in the occipital region (Bilateral) (L>R).  The patient does describe having had multiple nerve blocks at [place] [place] [facility], and the [facility].  I have reviewed the medical records and the only one that I can find is that of the [facility] were she had a bilateral transforaminal epidural steroid injection.  The rest of the information is not available in the electronic medical record and  therefore we  had the patient's sign some release forms so that we can request that information.  Physical exam today was negative for any abnormalities with toe walking or heel walking.  Straight leg raise was negative bilaterally.  Patrick maneuver was positive bilaterally for hip joint arthralgia with referral to buttocks region (Bilateral) (L>R).  Hip range of motion was decreased bilaterally but with the left being worse than the right.  In both instances the range of motion was accompanied by pain.  Patrick maneuver was equivocal for the SI joint bilaterally (L>R).  Hyperextension and rotation maneuver was positive bilaterally for ipsilateral lumbar facet joint arthralgia.  Kemp maneuver was also positive bilaterally.  Cervical spine range of motion was within normal limits but with some pain referred to the posterior lower neck region on hyperextension.  The patient also did experience some pain that was ipsilateral on rotation.  This pain was triggered bilaterally.  Lateral bending was also positive bilaterally for contralateral neck pain.  Today I took the time to provide the patient with information regarding my pain practice. The patient was informed that my practice is divided into two sections: an interventional pain management section, as well as a completely separate and distinct medication management section. I explained that I have procedure days for my interventional therapies, and evaluation days for follow-ups and medication management. Because of the amount of documentation required during both, they are kept separated. This means that there is the possibility that she may be scheduled for a procedure on one day, and medication management the next. I have also informed her that because of staffing and facility limitations, I no longer take patients for medication management only. To illustrate the reasons for this, I gave the patient the example of surgeons, and how inappropriate it would be  to refer a patient to his/her care, just to write for the post-surgical antibiotics on a surgery done by a different surgeon.   Because interventional pain management is my board-certified specialty, the patient was informed that joining my practice means that they are open to any and all interventional therapies. I made it clear that this does not mean that they will be forced to have any procedures done. What this means is that I believe interventional therapies to be essential part of the diagnosis and proper management of chronic pain conditions. Therefore, patients not interested in these interventional alternatives will be better served under the care of a different practitioner.  The patient was also made aware of my Comprehensive Pain Management Safety Guidelines where by joining my practice, they limit all of their nerve blocks and joint injections to those done by our practice, for as long as we are retained to manage their care.   Historic Controlled Substance Pharmacotherapy Review  PMP and historical list of controlled substances: None Current opioid analgesics:   None MME/day: 0 mg/day  Historical Monitoring: The patient  reports no history of drug use. List of all UDS Test(s): No results found for: "MDMA", "COCAINSCRNUR", "PCPSCRNUR", "PCPQUANT", "CANNABQUANT", "THCU", "ETH" List of other Serum/Urine Drug Screening Test(s):  No results found for: "AMPHSCRSER", "BARBSCRSER", "BENZOSCRSER", "COCAINSCRSER", "COCAINSCRNUR", "PCPSCRSER", "PCPQUANT", "THCSCRSER", "THCU", "CANNABQUANT", "OPIATESCRSER", "OXYSCRSER", "PROPOXSCRSER", "ETH" Historical Background Evaluation: [place] PMP: PDMP reviewed during this encounter. Online review of the past 42-month period conducted.             PMP NARX Score Report:  Narcotic: 000 Sedative: 000 Stimulant: 000 [place] Department of public safety, offender search: Editor, commissioning Information) Non-contributory Risk Assessment Profile: Aberrant  behavior: None  observed or detected today Risk factors for fatal opioid overdose: None identified today PMP NARX Overdose Risk Score: 000 Fatal overdose hazard ratio (HR): Calculation deferred Non-fatal overdose hazard ratio (HR): Calculation deferred Risk of opioid abuse or dependence: 0.7-3.0% with doses ? 36 MME/day and 6.1-26% with doses ? 120 MME/day. Substance use disorder (SUD) risk level: See below Personal History of Substance Abuse (SUD-Substance use disorder):  Alcohol: Negative  Illegal Drugs:  (CBD gummy qpm for last 1-2 years)  Rx Drugs: Negative  ORT Risk Level calculation: Low Risk  Opioid Risk Tool - 05/31/21 0932       Family History of Substance Abuse   Alcohol Negative    Illegal Drugs Negative    Rx Drugs Negative      Personal History of Substance Abuse   Alcohol Negative    Illegal Drugs --   CBD gummy qpm for last 1-2 years   Rx Drugs Negative      Age   Age between 41-45 years  No      History of Preadolescent Sexual Abuse   History of Preadolescent Sexual Abuse Negative or Female      Psychological Disease   Psychological Disease Negative    Depression Negative      Total Score   Opioid Risk Tool Scoring 0    Opioid Risk Interpretation Low Risk            ORT Scoring interpretation table:  Score <3 = Low Risk for SUD  Score between 4-7 = Moderate Risk for SUD  Score >8 = High Risk for Opioid Abuse   PHQ-2 Depression Scale:  Total score: 0  PHQ-2 Scoring interpretation table: (Score and probability of major depressive disorder)  Score 0 = No depression  Score 1 = 15.4% Probability  Score 2 = 21.1% Probability  Score 3 = 38.4% Probability  Score 4 = 45.5% Probability  Score 5 = 56.4% Probability  Score 6 = 78.6% Probability   PHQ-9 Depression Scale:  Total score: 0  PHQ-9 Scoring interpretation table:  Score 0-4 = No depression  Score 5-9 = Mild depression  Score 10-14 = Moderate depression  Score 15-19 = Moderately severe depression  Score  20-27 = Severe depression (2.4 times higher risk of SUD and 2.89 times higher risk of overuse)   Pharmacologic Plan: As per protocol, I have not taken over any controlled substance management, pending the results of ordered tests and/or consults.            Initial impression: Pending review of available data and ordered tests.  Meds   Current Outpatient Medications:    acetaminophen (TYLENOL) 650 MG CR tablet, Take 1,300 mg by mouth daily as needed for pain., Disp: , Rfl:    ANORO ELLIPTA 62.5-25 MCG/INH AEPB, Inhale 1 puff into the lungs daily., Disp: , Rfl:    ibuprofen (ADVIL) 200 MG tablet, Take 600 mg by mouth 2 (two) times daily., Disp: , Rfl:    Multiple Vitamin (MULTI-VITAMIN) tablet, Take by mouth., Disp: , Rfl:    Vitamin D, Ergocalciferol, (DRISDOL) 50000 units CAPS capsule, Take 1 capsule (50,000 Units total) by mouth every 7 (seven) days., Disp: 12 capsule, Rfl: 0   atorvastatin (LIPITOR) 10 MG tablet, Take 0.5 tablets (5 mg total) by mouth daily., Disp: 15 tablet, Rfl: 2   methocarbamol (ROBAXIN) 500 MG tablet, Take 1 tablet (500 mg total) by mouth 3 (three) times daily. Each refill must last 30 days., Disp:  90 tablet, Rfl: 0   NON FORMULARY, Apply 1 Application topically as needed. Gummies, oils as needed/qhs, Disp: , Rfl:   Imaging Review  Cervical Imaging: Cervical DG complete: Results for orders placed in visit on 04/12/19 DG Cervical Spine Complete  Narrative CLINICAL DATA:  Chronic cervical spine pain.  EXAM: CERVICAL SPINE - COMPLETE 4+ VIEW  COMPARISON:  None.  FINDINGS: No fracture or spondylolisthesis is noted. Mild degenerative disc disease is noted at C4-5. Severe degenerative disc disease is noted at C5-6 and C6-7 with anterior osteophyte formation. No prevertebral soft tissue swelling is noted. No significant neural foraminal stenosis is noted.  IMPRESSION: Negative cervical spine radiographs.   Electronically Signed By: Marijo Conception M.D. On:  04/12/2019 16:01  Lumbosacral Imaging: Lumbar MR wo contrast: Results for orders placed in visit on 07/27/19 MR Lumbar Spine Wo Contrast  Narrative CLINICAL DATA:  Low back pain.  Right lower extremity radiculopathy.  EXAM: MRI LUMBAR SPINE WITHOUT CONTRAST  TECHNIQUE: Multiplanar, multisequence MR imaging of the lumbar spine was performed. No intravenous contrast was administered.  COMPARISON:  Lumbar spine radiographs 04/12/2019. MRI lumbar spine 10/03/2016  FINDINGS: Segmentation: 5 non rib-bearing lumbar type vertebral bodies are present. The lowest fully formed vertebral body is L5.  Alignment: Slight retrolisthesis at L1-2 and L2-3 is new. Grade 1 anterolisthesis at L4-5 is stable. Rightward curvature is centered at L3-4.  Vertebrae: Edematous endplate marrow changes the progressed on the left at L3-4. Marrow signal and vertebral body heights are otherwise normal.  Conus medullaris and cauda equina: Conus extends to the L1 level. Conus and cauda equina appear normal.  Paraspinal and other soft tissues: Limited imaging the abdomen is unremarkable. There is no significant adenopathy. No solid organ lesions are present.  Disc levels:  T12-L1: Negative.  L1-2: Mild disc bulging and facet hypertrophy is present. No significant stenosis is present.  L2-3: A progressive leftward disc protrusion is present. Mild left subarticular and foraminal stenosis is new.  L3-4: A broad-based disc protrusion extends into the left neural foramen. Progressive left facet hypertrophy is noted. Moderate left and mild right foraminal narrowing have progressed. Moderate left subarticular stenosis is noted.  L4-5: A broad-based disc protrusion and advanced facet hypertrophy have progressed bilaterally. Progressive moderate right subarticular and foraminal narrowing is noted. Mild left foraminal narrowing is stable.  L5-S1: Progressive moderate facet hypertrophy is present. A  shallow central disc protrusion is present. No significant stenosis is present.  IMPRESSION: 1. Progressive multilevel spondylosis of the lumbar spine as described. 2. Mild left subarticular and foraminal stenosis at L2-3 is new. 3. Moderate left subarticular and foraminal and mild right foraminal narrowing at L3-4 have progressed. 4. Progressive moderate right subarticular and foraminal narrowing at L4-5. This is the most significant right-sided disease. 5. Mild left foraminal narrowing at L4-5 is stable. 6. Progressive moderate facet hypertrophy at L5-S1 without significant stenosis.   Electronically Signed By: San Morelle M.D. On: 07/28/2019 14:43  Lumbar DG 2-3 views: Results for orders placed during the hospital encounter of 08/11/16 DG Lumbar Spine 2-3 Views  Narrative CLINICAL DATA:  Chronic low back pain radiates to left leg.  EXAM: LUMBAR SPINE - 2-3 VIEW  COMPARISON:  None.  FINDINGS: No fracture. Trace spondylolisthesis is seen at L4-5 with evidence of lower lumbar facet degeneration from L3-1. SI joints are unremarkable.  IMPRESSION: Lower lumbar degenerative changes without acute bony abnormality.   Electronically Signed By: Misty Stanley M.D. On: 08/11/2016 16:45  Lumbar DG (  Complete) 4+V: Results for orders placed in visit on 04/12/19 DG Lumbar Spine Complete  Narrative CLINICAL DATA:  Chronic low back pain.  No known injury.  EXAM: LUMBAR SPINE - COMPLETE 4+ VIEW  COMPARISON:  None.  FINDINGS: There is convex right scoliosis with the apex at L3-4. No listhesis. Loss of disc space height is present at all levels and has progressed since the prior examination. Multilevel facet arthropathy also appears worse. No fracture. Paraspinous structures demonstrate atherosclerosis.  IMPRESSION: Worsened appearance of multilevel degenerative disease.  Convex right scoliosis appears unchanged.  Atherosclerosis.   Electronically  Signed By: Inge Rise M.D. On: 04/12/2019 16:03  Hip Imaging: Hip-R MR wo contrast: Results for orders placed in visit on 07/27/19 MR HIP RIGHT WO CONTRAST  Narrative CLINICAL DATA:  Chronic hip pain.  Possible trochanteric bursitis.  EXAM: MR OF THE RIGHT HIP WITHOUT CONTRAST  TECHNIQUE: Multiplanar, multisequence MR imaging was performed. No intravenous contrast was administered.  COMPARISON:  None.  FINDINGS: Bones: No acute fracture. No dislocation. No femoral head avascular necrosis. No focal bone marrow edema. No suspicious bone lesion. Moderate arthropathy of the pubic symphysis. Minimal arthropathy of the bilateral SI joints without evidence of sacroiliitis. Partially visualized lower lumbar spondylosis, see dedicated same day lumbar spine MRI.  Articular cartilage and labrum  Articular cartilage: No chondral defect. No subchondral marrow signal changes.  Labrum: Suspect low-grade partial thickness superior labral tear (series 7, image 9), suboptimally evaluated on non arthrographic imaging.  Joint or bursal effusion  Joint effusion:  No hip joint effusion.  Bursae: Mild right peritrochanteric bursal edema without focal bursal fluid collection.  Muscles and tendons  Muscles and tendons: Mild tendinosis of the distal gluteus medius tendon (series 7, image 7) without evidence of tear. Tendinosis with low-grade tear at the right hamstring tendon origin (series 5, image 17). Remaining tendinous structures about the hip are intact.  Other findings  Miscellaneous:   Colonic diverticulosis.  IMPRESSION: 1. No acute osseous abnormality of the right hip. 2. Mild tendinosis of the gluteus medius tendon with associated mild right peritrochanteric bursal edema. 3. Tendinosis with low-grade tear at the right hamstring tendon origin. 4. Suspect low-grade partial thickness superior labral tear. 5. Colonic diverticulosis.   Electronically Signed By:  Davina Poke D.O. On: 07/28/2019 18:36  Hip-L DG 2-3 views: Results for orders placed during the hospital encounter of 01/31/19 DG HIP UNILAT W OR W/O PELVIS 2-3 VIEWS LEFT  Narrative CLINICAL DATA:  Chronic left hip pain  EXAM: DG HIP (WITH OR WITHOUT PELVIS) 2-3V LEFT  COMPARISON:  None.  FINDINGS: Degenerative changes in the hips bilaterally, slightly greater on the left with joint space narrowing and spurring. SI joints symmetric and unremarkable.  IMPRESSION: Degenerative changes in the hips, left greater than right. No acute bony abnormality.   Electronically Signed By: Rolm Baptise M.D. On: 01/31/2019 11:28  Foot Imaging: Foot-L DG Complete: Results for orders placed in visit on 02/11/16 DG Foot Complete Left  Narrative SEE PROGRESS NOTE FOR X-RAY REPORT  Complexity Note: Imaging results reviewed. Results shared with Ms. Kia, using [employer].                        ROS  Cardiovascular: Daily Aspirin intake Pulmonary or Respiratory: Difficulty blowing air out (Emphysema) Neurological: Curved spine Psychological-Psychiatric: No reported psychological or psychiatric signs or symptoms such as difficulty sleeping, anxiety, depression, delusions or hallucinations (schizophrenial), mood swings (bipolar disorders) or suicidal  ideations or attempts Gastrointestinal: No reported gastrointestinal signs or symptoms such as vomiting or evacuating blood, reflux, heartburn, alternating episodes of diarrhea and constipation, inflamed or scarred liver, or pancreas or irrregular and/or infrequent bowel movements Genitourinary: No reported renal or genitourinary signs or symptoms such as difficulty voiding or producing urine, peeing blood, non-functioning kidney, kidney stones, difficulty emptying the bladder, difficulty controlling the flow of urine, or chronic kidney disease Hematological: No reported hematological signs or symptoms such as prolonged bleeding, low or  poor functioning platelets, bruising or bleeding easily, hereditary bleeding problems, low energy levels due to low hemoglobin or being anemic Endocrine: No reported endocrine signs or symptoms such as high or low blood sugar, rapid heart rate due to high thyroid levels, obesity or weight gain due to slow thyroid or thyroid disease Rheumatologic: No reported rheumatological signs and symptoms such as fatigue, joint pain, tenderness, swelling, redness, heat, stiffness, decreased range of motion, with or without associated rash Musculoskeletal: Negative for myasthenia gravis, muscular dystrophy, multiple sclerosis or malignant hyperthermia Work History: Working full time  Allergies  Ms. Ferreras is allergic to crestor [rosuvastatin], simvastatin, and augmentin [amoxicillin-pot clavulanate].  Laboratory Chemistry Profile   Renal Lab Results  Component Value Date   BUN 25 (H) 05/31/2021   CREATININE 0.68 05/31/2021   GFR 84.17 09/20/2016   GFRAA >60 01/16/2019   GFRNONAA >60 05/31/2021     Electrolytes Lab Results  Component Value Date   NA 139 05/31/2021   K 4.2 05/31/2021   CL 107 05/31/2021   CALCIUM 9.4 05/31/2021   MG 2.3 05/31/2021     Hepatic Lab Results  Component Value Date   AST 28 05/31/2021   ALT 29 05/31/2021   ALBUMIN 3.8 05/31/2021   ALKPHOS 79 05/31/2021     ID No results found for: "LYMEIGGIGMAB", "HIV", "SARSCOV2NAA", "STAPHAUREUS", "MRSAPCR", "HCVAB", "PREGTESTUR", "RMSFIGG", "QFVRPH1IGG", "QFVRPH2IGG"   Bone Lab Results  Component Value Date   VD25OH 32.18 09/20/2016   25OHVITD1 69 05/31/2021   25OHVITD2 36 05/31/2021   25OHVITD3 33 05/31/2021     Endocrine Lab Results  Component Value Date   GLUCOSE 90 05/31/2021   TSH 2.15 12/10/2019     Neuropathy Lab Results  Component Value Date   VITAMINB12 206 05/31/2021     CNS No results found for: "COLORCSF", "APPEARCSF", "RBCCOUNTCSF", "WBCCSF", "POLYSCSF", "LYMPHSCSF", "EOSCSF", "PROTEINCSF",  "GLUCCSF", "JCVIRUS", "CSFOLI", "IGGCSF", "LABACHR", "ACETBL"   Inflammation (CRP: Acute  ESR: Chronic) Lab Results  Component Value Date   CRP 0.7 05/31/2021   ESRSEDRATE 5 05/31/2021     Rheumatology Lab Results  Component Value Date   RF <14 09/20/2016   ANA POS (A) 09/20/2016   ANA POS (A) 09/20/2016     Coagulation Lab Results  Component Value Date   PLT 195.0 12/10/2019     Cardiovascular Lab Results  Component Value Date   CKTOTAL 88 12/10/2019   HGB 14.3 12/10/2019   HCT 42.1 12/10/2019     Screening No results found for: "SARSCOV2NAA", "COVIDSOURCE", "STAPHAUREUS", "MRSAPCR", "HCVAB", "HIV", "PREGTESTUR"   Cancer No results found for: "CEA", "CA125", "LABCA2"   Allergens No results found for: "ALMOND", "APPLE", "ASPARAGUS", "AVOCADO", "BANANA", "BARLEY", "BASIL", "BAYLEAF", "GREENBEAN", "LIMABEAN", "WHITEBEAN", "BEEFIGE", "REDBEET", "BLUEBERRY", "BROCCOLI", "CABBAGE", "MELON", "CARROT", "CASEIN", "CASHEWNUT", "CAULIFLOWER", "CELERY"     Note: Lab results reviewed.  PFSH  Drug: Ms. Hagmann  reports no history of drug use. Alcohol:  reports current alcohol use of about 1.0 standard drink of alcohol per week. Tobacco:  reports  that she quit smoking about 8 years ago. Her smoking use included e-cigarettes and cigarettes. She has never used smokeless tobacco. Medical:  has a past medical history of History of tobacco use, Routine general medical examination at a health care facility, and Screening for lipoid disorders. Family: family history includes Asthma in her mother; Atrial fibrillation in her father; Bladder Cancer in her father; CVA (age of onset: 54) in her father; Diverticulitis in her brother; Hypertension in her mother; Lung cancer in her brother.  Past Surgical History:  Procedure Laterality Date   OTHER SURGICAL HISTORY     Birthmark removed from forehead 7th grade   Active Ambulatory Problems    Diagnosis Date Noted   Degenerative arthritis of  hip 01/16/2014   Greater trochanteric bursitis of hip (Left) 11/25/2014   Piriformis syndrome of left side 05/13/2015   Morton's neuroma of foot (Left) 05/24/2016   Leg cramping 09/19/2016   Degenerative lumbar spinal stenosis 10/17/2016   Greater trochanteric bursitis of hip (Right) 07/10/2018   Rhinitis, chronic 01/06/2017   Ex-cigarette smoker 12/19/2018   Facet arthritis of lumbar region 03/05/2019   Chronic SI joint pain (Left) 07/31/2019   Hamstring tendinitis at origin 07/31/2019   Myalgia due to statin 12/25/2019   Chronic nasal congestion 02/08/2016   Fall from horse 4/14. 05/30/2021   Allergic rhinitis due to pollen 05/30/2021   Anogenital warts 05/30/2021   Bilateral hearing loss 01/06/2017   Cervical high risk HPV (human papillomavirus) test positive 05/30/2021   BPPV (benign paroxysmal positional vertigo), unspecified laterality 01/06/2017   Cervical intraepithelial neoplasia grade 1 05/30/2021   Chronic back pain 09/06/2017   Chronic obstructive pulmonary disease, unspecified ([place]) 05/30/2021   Degeneration of lumbar intervertebral disc 05/30/2021   Disequilibrium 02/18/2019   Diverticular disease of colon 09/01/2020   Dyslipidemia 05/30/2021   Enthesopathy of hip region (Right) 11/25/2014   Hardening of the aorta (main artery of the heart) ([place]) 05/30/2021   History of cervical dysplasia 05/30/2021   Mixed incontinence 05/30/2021   Overactive bladder 05/30/2021   Metatarsalgia of foot (Left) 01/10/2020   Pain in foot (Left) 01/10/2020   Primary insomnia 05/30/2021   Screening for malignant neoplasm of colon 09/01/2020   Stenosis of cervix 05/30/2021   Tinnitus, bilateral 01/06/2017   Tobacco dependence in remission 05/30/2021   Vitamin D deficiency 05/30/2021   Chronic maxillary sinusitis 02/18/2019   Plantar fasciitis of foot (Left) 01/10/2020   Chest pain 09/01/2020   Chronic pain syndrome 05/30/2021   Pharmacologic therapy 05/30/2021   Disorder of  skeletal system 05/30/2021   Problems influencing health status 05/30/2021   Abnormal MRI, lumbar spine (07/28/2019) 05/31/2021   Cervicalgia (4th area of Pain) 05/31/2021   Chronic low back pain (1ry area of Pain) (Bilateral) w/o sciatica 05/31/2021   Lumbar facet syndrome (Bilateral) 05/31/2021   Lumbosacral facet hypertrophy (Multilevel) (Bilateral) 05/31/2021   Chronic lower extremity pain (3ry area of Pain) (nonradicular) (Bilateral) 05/31/2021   Cervicogenic headache (6th area of Pain) 05/31/2021   Chronic shoulder pain (5th area of Pain) (Bilateral) 05/31/2021   Use of cannabinoid edibles (CBD) 05/31/2021   Chronic hip pain (2ry area of Pain) (Bilateral) (R>L) 05/31/2021   DDD (degenerative disc disease), lumbar 07/06/2021   Osteoarthritis of acromioclavicular joints (Bilateral) 07/06/2021   Spondylosis of lumbar spine (Multilevel) 07/06/2021   Lumbar lateral recess stenosis (Left: L2-3, L3-4) (Right: L2-3, L4-5) 07/06/2021   Lumbar foraminal stenosis (Left: L2-3, L3-4, L4-5) (Right: L3-4, L4-5) 07/06/2021   Dextroscoliosis of  lumbar spine (apex at L3-4) 07/06/2021   Grade 1 Anterolisthesis of lumbar spine (L4/L5) (5 mm) 07/06/2021   Osteoarthritis of hips (Bilateral) (L>R) 07/06/2021   Tendinopathy of gluteus medius (Right) 07/06/2021   Proximal hamstring tendon low-grade tear, sequela (Right) 07/06/2021   Superior labral tear (low-grade, partial-thickness) of hip joint, sequela (Right) 07/06/2021   Spondylosis without myelopathy or radiculopathy, lumbosacral region 07/06/2021   Chronic musculoskeletal pain 07/06/2021   Other spondylosis, sacral and sacrococcygeal region 07/27/2021   Resolved Ambulatory Problems    Diagnosis Date Noted   Gluteal tendinitis of left buttock 11/25/2014   Strain of left piriformis muscle 02/15/2018   Shortness of breath 12/19/2018   Past Medical History:  Diagnosis Date   History of tobacco use    Routine general medical examination at a  health care facility    Screening for lipoid disorders    Constitutional Exam  General appearance: Well nourished, well developed, and well hydrated. In no apparent acute distress Vitals:   05/31/21 0922  BP: (!) 120/92  Pulse: 72  Resp: 16  Temp: (!) 97.2 F (36.2 C)  SpO2: 100%  Weight: 166 lb (75.3 kg)  Height: 5\' 2"  (1.575 m)   BMI Assessment: Estimated body mass index is 30.36 kg/m as calculated from the following:   Height as of this encounter: 5\' 2"  (1.575 m).   Weight as of this encounter: 166 lb (75.3 kg).  BMI interpretation table: BMI level Category Range association with higher incidence of chronic pain  <18 kg/m2 Underweight   18.5-24.9 kg/m2 Ideal body weight   25-29.9 kg/m2 Overweight Increased incidence by 20%  30-34.9 kg/m2 Obese (Class I) Increased incidence by 68%  35-39.9 kg/m2 Severe obesity (Class II) Increased incidence by 136%  >40 kg/m2 Extreme obesity (Class III) Increased incidence by 254%   Patient's current BMI Ideal Body weight  Body mass index is 30.36 kg/m. Ideal body weight: 50.1 kg (110 lb 7.2 oz) Adjusted ideal body weight: 60.2 kg (132 lb 10.7 oz)   BMI Readings from Last 4 Encounters:  07/27/21 27.20 kg/m  07/06/21 26.79 kg/m  07/01/21 30.51 kg/m  05/31/21 30.36 kg/m   Wt Readings from Last 4 Encounters:  07/27/21 166 lb (75.3 kg)  07/06/21 166 lb (75.3 kg)  07/01/21 166 lb 12.8 oz (75.7 kg)  05/31/21 166 lb (75.3 kg)    Psych/Mental status: Alert, oriented x 3 (person, place, & time)       Eyes: PERLA Respiratory: No evidence of acute respiratory distress  Assessment  Primary Diagnosis & Pertinent Problem List: The primary encounter diagnosis was Chronic pain syndrome. Diagnoses of Cervicalgia, Chronic low back pain (1ry area of Pain) (Bilateral) w/o sciatica, Lumbar facet syndrome (Bilateral), Lumbosacral facet hypertrophy (Multilevel) (Bilateral), Chronic lower extremity pain (2ry area of Pain) (nonradicular)  (Bilateral), Cervicogenic headache (4th area of Pain), Chronic shoulder pain (5th area of Pain) (Bilateral), Use of cannabinoid edibles (CBD), Chronic hip pain (2ry area of Pain) (Bilateral) (R>L), Pharmacologic therapy, Disorder of skeletal system, Problems influencing health status, and Abnormal MRI, lumbar spine (07/28/2019) were also pertinent to this visit.  Visit Diagnosis (New problems to examiner): 1. Chronic pain syndrome   2. Cervicalgia   3. Chronic low back pain (1ry area of Pain) (Bilateral) w/o sciatica   4. Lumbar facet syndrome (Bilateral)   5. Lumbosacral facet hypertrophy (Multilevel) (Bilateral)   6. Chronic lower extremity pain (2ry area of Pain) (nonradicular) (Bilateral)   7. Cervicogenic headache (4th area of Pain)  8. Chronic shoulder pain (5th area of Pain) (Bilateral)   9. Use of cannabinoid edibles (CBD)   10. Chronic hip pain (2ry area of Pain) (Bilateral) (R>L)   11. Pharmacologic therapy   12. Disorder of skeletal system   13. Problems influencing health status   14. Abnormal MRI, lumbar spine (07/28/2019)    Plan of Care (Initial workup plan)  Note: Ms. Breiner was reminded that as per protocol, today's visit has been an evaluation only. We have not taken over the patient's controlled substance management.  Problem-specific plan: No problem-specific Assessment & Plan notes found for this encounter.  Lab Orders         Compliance Drug Analysis, Ur         Comp. Metabolic Panel (12)         Magnesium         Vitamin B12         Sedimentation rate         25-Hydroxy vitamin D Lcms D2+D3         C-reactive protein         Delta-8 / Delta-9 THC mtb,MS,U         Cannabidiol (CBD)/Tetrahydrocannabinol (THC) Ratio, Urine     Imaging Orders         DG Cervical Spine With Flex & Extend         DG Lumbar Spine Complete W/Bend         DG Shoulder Right         DG Shoulder Left         DG HIP UNILAT W OR W/O PELVIS 2-3 VIEWS RIGHT         DG HIP UNILAT W  OR W/O PELVIS 2-3 VIEWS LEFT     Referral Orders  No referral(s) requested today   Procedure Orders    No procedure(s) ordered today   Pharmacotherapy (current): Medications ordered:  No orders of the defined types were placed in this encounter.  Medications administered during this visit: Izora Gala B. Nine had no medications administered during this visit.   Pharmacological management options:  Opioid Analgesics: The patient was informed that there is no guarantee that she would be a candidate for opioid analgesics. The decision will be made following CDC guidelines. This decision will be based on the results of diagnostic studies, as well as Ms. Droege's risk profile.   Membrane stabilizer: To be determined at a later time  Muscle relaxant: To be determined at a later time  NSAID: To be determined at a later time  Other analgesic(s): To be determined at a later time   Interventional management options: Ms. Topping was informed that there is no guarantee that she would be a candidate for interventional therapies. The decision will be based on the results of diagnostic studies, as well as Ms. Hunt's risk profile.  Procedure(s) under consideration:  Pending results of ordered studies      Interventional Therapies  Risk  Complexity Considerations:   Estimated body mass index is 30.36 kg/m as calculated from the following:   Height as of this encounter: $RemoveBeforeD'5\' 2"'[id number]$  (1.575 m).   Weight as of this encounter: 166 lb (75.3 kg). WNL   Planned  Pending:   Pending further evaluation   Under consideration:   Diagnostic bilateral lumbar facet MBB x1    Completed:   None at this time   Completed by other providers:   [facility] imaging)  Diagnostic bilateral L5 TFESI x1 (03/12/2021)  by Girtha Hake, MD (PMR) [facility])    Therapeutic  Palliative (PRN) options:   None established    Provider-requested follow-up: Return for (30min), Eval-day (M,W), (F2F), 2nd Visit, for  review of ordered tests.  Future Appointments  Date Time Provider [place]  08/19/2021 11:00 AM Milinda Pointer, MD [facility] None    Note by: Gaspar Cola, MD Date: 05/31/2021; Time: 11:53 AM

## 2021-05-31 ENCOUNTER — Encounter: Payer: Self-pay | Admitting: Pain Medicine

## 2021-05-31 ENCOUNTER — Ambulatory Visit
Admission: RE | Admit: 2021-05-31 | Discharge: 2021-05-31 | Disposition: A | Discharge status: Home / Self Care | Payer: BC Managed Care – PPO | Attending: Pain Medicine | Admitting: Pain Medicine

## 2021-05-31 ENCOUNTER — Other Ambulatory Visit
Admission: RE | Admit: 2021-05-31 | Discharge: 2021-05-31 | Disposition: A | Discharge status: Home / Self Care | Payer: BC Managed Care – PPO | Source: Home / Self Care | Attending: Pain Medicine | Admitting: Pain Medicine

## 2021-05-31 ENCOUNTER — Ambulatory Visit
Admission: RE | Admit: 2021-05-31 | Discharge: 2021-05-31 | Disposition: A | Discharge status: Home / Self Care | Payer: BC Managed Care – PPO | Source: Ambulatory Visit | Attending: Pain Medicine | Admitting: Pain Medicine

## 2021-05-31 ENCOUNTER — Ambulatory Visit: Payer: BC Managed Care – PPO | Admitting: Pain Medicine

## 2021-05-31 VITALS — BP 120/92 | HR 72 | Temp 97.2°F | Resp 16 | Ht 62.0 in | Wt 166.0 lb

## 2021-05-31 DIAGNOSIS — M542 Cervicalgia: Secondary | ICD-10-CM

## 2021-05-31 DIAGNOSIS — G4486 Cervicogenic headache: Secondary | ICD-10-CM | POA: Insufficient documentation

## 2021-05-31 DIAGNOSIS — E559 Vitamin D deficiency, unspecified: Secondary | ICD-10-CM | POA: Diagnosis not present

## 2021-05-31 DIAGNOSIS — M47816 Spondylosis without myelopathy or radiculopathy, lumbar region: Secondary | ICD-10-CM | POA: Insufficient documentation

## 2021-05-31 DIAGNOSIS — M47817 Spondylosis without myelopathy or radiculopathy, lumbosacral region: Secondary | ICD-10-CM | POA: Insufficient documentation

## 2021-05-31 DIAGNOSIS — M79605 Pain in left leg: Secondary | ICD-10-CM

## 2021-05-31 DIAGNOSIS — M25552 Pain in left hip: Secondary | ICD-10-CM

## 2021-05-31 DIAGNOSIS — M25551 Pain in right hip: Secondary | ICD-10-CM | POA: Insufficient documentation

## 2021-05-31 DIAGNOSIS — G894 Chronic pain syndrome: Secondary | ICD-10-CM

## 2021-05-31 DIAGNOSIS — F1290 Cannabis use, unspecified, uncomplicated: Secondary | ICD-10-CM | POA: Diagnosis not present

## 2021-05-31 DIAGNOSIS — Z1321 Encounter for screening for nutritional disorder: Secondary | ICD-10-CM | POA: Insufficient documentation

## 2021-05-31 DIAGNOSIS — M19012 Primary osteoarthritis, left shoulder: Secondary | ICD-10-CM | POA: Diagnosis not present

## 2021-05-31 DIAGNOSIS — M19011 Primary osteoarthritis, right shoulder: Secondary | ICD-10-CM | POA: Diagnosis not present

## 2021-05-31 DIAGNOSIS — G8929 Other chronic pain: Secondary | ICD-10-CM | POA: Diagnosis not present

## 2021-05-31 DIAGNOSIS — M25511 Pain in right shoulder: Secondary | ICD-10-CM | POA: Insufficient documentation

## 2021-05-31 DIAGNOSIS — M160 Bilateral primary osteoarthritis of hip: Secondary | ICD-10-CM | POA: Diagnosis not present

## 2021-05-31 DIAGNOSIS — M545 Low back pain, unspecified: Secondary | ICD-10-CM | POA: Insufficient documentation

## 2021-05-31 DIAGNOSIS — M25512 Pain in left shoulder: Secondary | ICD-10-CM | POA: Insufficient documentation

## 2021-05-31 DIAGNOSIS — R937 Abnormal findings on diagnostic imaging of other parts of musculoskeletal system: Secondary | ICD-10-CM | POA: Insufficient documentation

## 2021-05-31 DIAGNOSIS — M79604 Pain in right leg: Secondary | ICD-10-CM

## 2021-05-31 DIAGNOSIS — Z79899 Other long term (current) drug therapy: Secondary | ICD-10-CM

## 2021-05-31 DIAGNOSIS — M4316 Spondylolisthesis, lumbar region: Secondary | ICD-10-CM | POA: Diagnosis not present

## 2021-05-31 DIAGNOSIS — M419 Scoliosis, unspecified: Secondary | ICD-10-CM | POA: Diagnosis not present

## 2021-05-31 DIAGNOSIS — M899 Disorder of bone, unspecified: Secondary | ICD-10-CM | POA: Insufficient documentation

## 2021-05-31 DIAGNOSIS — Z789 Other specified health status: Secondary | ICD-10-CM | POA: Diagnosis not present

## 2021-05-31 DIAGNOSIS — M47812 Spondylosis without myelopathy or radiculopathy, cervical region: Secondary | ICD-10-CM | POA: Diagnosis not present

## 2021-05-31 LAB — MAGNESIUM: Magnesium: 2.3 mg/dL (ref 1.7–2.4)

## 2021-05-31 LAB — COMPREHENSIVE METABOLIC PANEL
ALT: 29 U/L (ref 0–44)
AST: 28 U/L (ref 15–41)
Albumin: 3.8 g/dL (ref 3.5–5.0)
Alkaline Phosphatase: 79 U/L (ref 38–126)
Anion gap: 9 (ref 5–15)
BUN: 25 mg/dL — ABNORMAL HIGH (ref 8–23)
CO2: 23 mmol/L (ref 22–32)
Calcium: 9.4 mg/dL (ref 8.9–10.3)
Chloride: 107 mmol/L (ref 98–111)
Creatinine, Ser: 0.68 mg/dL (ref 0.44–1.00)
GFR, Estimated: >60 mL/min (ref >60)
Glucose, Bld: 90 mg/dL (ref 70–99)
Potassium: 4.2 mmol/L (ref 3.5–5.1)
Sodium: 139 mmol/L (ref 135–145)
Total Bilirubin: 0.9 mg/dL (ref 0.3–1.2)
Total Protein: 6.8 g/dL (ref 6.5–8.1)

## 2021-05-31 LAB — SEDIMENTATION RATE: Sed Rate: 5 mm/hr (ref 0–30)

## 2021-05-31 LAB — VITAMIN B12: Vitamin B-12: 206 pg/mL (ref 180–914)

## 2021-05-31 LAB — C-REACTIVE PROTEIN: CRP: 0.7 mg/dL (ref <1.0)

## 2021-05-31 NOTE — Progress Notes (Signed)
Safety precautions to be maintained throughout the outpatient stay will include: orient to surroundings, keep bed in low position, maintain call bell within reach at all times, provide assistance with transfer out of bed and ambulation.  

## 2021-06-04 LAB — DELTA-8 / DELTA-9 THC MTB,MS,U
Carboxy-Delta-8-THC: >500 ng/mL
Carboxy-Delta-9-THC: 117.4 ng/mL

## 2021-06-04 LAB — COMPLIANCE DRUG ANALYSIS, UR

## 2021-06-06 LAB — 25-HYDROXY VITAMIN D LCMS D2+D3
25-Hydroxy, Vitamin D-2: 36 ng/mL
25-Hydroxy, Vitamin D-3: 33 ng/mL
25-Hydroxy, Vitamin D: 69 ng/mL

## 2021-06-07 LAB — CANNABIDIOL (CBD)/TETRAHYDROCANNABINOL (THC) RATIO, URINE

## 2021-06-07 NOTE — Telephone Encounter (Signed)
From patient. Pts appeal for Livalo was denied despite her having tried and failing 2 statin medications. Please advise.

## 2021-06-07 NOTE — Telephone Encounter (Signed)
Set her up for OV t discuss hyperlipidemia and either I or CC can see. May be just Zetia is good or zetia plus simva 10 mg everyother day

## 2021-06-08 DIAGNOSIS — M5459 Other low back pain: Secondary | ICD-10-CM | POA: Diagnosis not present

## 2021-06-08 DIAGNOSIS — M25552 Pain in left hip: Secondary | ICD-10-CM | POA: Diagnosis not present

## 2021-06-08 DIAGNOSIS — M6281 Muscle weakness (generalized): Secondary | ICD-10-CM | POA: Diagnosis not present

## 2021-06-08 DIAGNOSIS — M25551 Pain in right hip: Secondary | ICD-10-CM | POA: Diagnosis not present

## 2021-06-08 NOTE — Telephone Encounter (Signed)
See above

## 2021-06-09 NOTE — Telephone Encounter (Signed)
Attempted to call pt to set up OV, no answer. Left vm requesting call back.

## 2021-06-09 NOTE — Telephone Encounter (Signed)
Pt coming to the office 07/01/2021 @ 3:15 pm.

## 2021-06-22 DIAGNOSIS — M25551 Pain in right hip: Secondary | ICD-10-CM | POA: Diagnosis not present

## 2021-06-22 DIAGNOSIS — M6281 Muscle weakness (generalized): Secondary | ICD-10-CM | POA: Diagnosis not present

## 2021-06-22 DIAGNOSIS — M25552 Pain in left hip: Secondary | ICD-10-CM | POA: Diagnosis not present

## 2021-06-22 DIAGNOSIS — M5459 Other low back pain: Secondary | ICD-10-CM | POA: Diagnosis not present

## 2021-06-23 ENCOUNTER — Encounter: Payer: Self-pay | Admitting: Pain Medicine

## 2021-06-28 ENCOUNTER — Ambulatory Visit: Payer: BC Managed Care – PPO | Admitting: Cardiology

## 2021-07-01 ENCOUNTER — Ambulatory Visit: Payer: BC Managed Care – PPO | Admitting: Cardiology

## 2021-07-01 ENCOUNTER — Encounter: Payer: Self-pay | Admitting: Cardiology

## 2021-07-01 VITALS — BP 127/71 | HR 74 | Temp 97.6°F | Resp 17 | Ht 62.0 in | Wt 166.8 lb

## 2021-07-01 DIAGNOSIS — I2510 Atherosclerotic heart disease of native coronary artery without angina pectoris: Secondary | ICD-10-CM

## 2021-07-01 DIAGNOSIS — M25552 Pain in left hip: Secondary | ICD-10-CM | POA: Diagnosis not present

## 2021-07-01 DIAGNOSIS — I2584 Coronary atherosclerosis due to calcified coronary lesion: Secondary | ICD-10-CM | POA: Diagnosis not present

## 2021-07-01 DIAGNOSIS — M5459 Other low back pain: Secondary | ICD-10-CM | POA: Diagnosis not present

## 2021-07-01 DIAGNOSIS — M6281 Muscle weakness (generalized): Secondary | ICD-10-CM | POA: Diagnosis not present

## 2021-07-01 DIAGNOSIS — E7800 Pure hypercholesterolemia, unspecified: Secondary | ICD-10-CM

## 2021-07-01 DIAGNOSIS — M25551 Pain in right hip: Secondary | ICD-10-CM | POA: Diagnosis not present

## 2021-07-01 MED ORDER — EZETIMIBE 10 MG PO TABS: 10.0000 mg | ORAL_TABLET | Freq: Every evening | ORAL | 2 refills | Status: DC

## 2021-07-01 MED ORDER — ATORVASTATIN CALCIUM 10 MG PO TABS: 5.0000 mg | ORAL_TABLET | Freq: Every day | ORAL | 2 refills | Status: DC

## 2021-07-01 NOTE — Patient Instructions (Signed)
Please start with atorvastatin 5 mg daily, once you tolerate this, he can start Zetia that is ezetimibe 3 weeks from now.

## 2021-07-01 NOTE — Progress Notes (Signed)
Primary Physician/Referring:  Leonel Ramsay, MD  Patient ID: Samantha Cox, female    DOB: 07/27/1955, 66 y.o.   MRN: [id number]  Chief Complaint  Patient presents with   Hyperlipidemia    3 MONTH   HPI:    Samantha Cox  is a 66 y.o. Caucasian female with 40+-pack-year history of smoking quit in 2015, lobular and paraseptal emphysema/COPD, coronary calcification noted on the CT scan of the chest, hyperlipidemia and GERD presents for annual visit, in view of coronary calcification and started on a statin therapy with improvement in LDL.   Patient states that she is doing well and she has not had any chest pain or dyspnea, her physical activity is limited due to back pain.  She has been concerned about statins as she was having severe arthralgia but wanted to be back on it, on her last office visit about 2 to 3 months ago, I will try to obtain Livalo however it was denied by her insurance company.  She is now here to discuss statin therapy again and is willing to try a statin.  Past Medical History:  Diagnosis Date   History of tobacco use    Routine general medical examination at a health care facility    Screening for lipoid disorders    Past Surgical History:  Procedure Laterality Date   OTHER SURGICAL HISTORY     Birthmark removed from forehead 7th grade   Social History   Tobacco Use   Smoking status: Former    Packs/day: 0.00    Years: 40.00    Pack years: 0.00    Types: E-cigarettes, Cigarettes    Quit date: 2015    Years since quitting: 8.4   Smokeless tobacco: Never   Tobacco comments:    Past smoker most of her life now Vaps 05/31/21  Substance Use Topics   Alcohol use: Yes    Alcohol/week: 1.0 standard drink    Types: 1 Glasses of wine per week    Comment: occ   ROS  Review of Systems  Cardiovascular:  Negative for chest pain, dyspnea on exertion and leg swelling.  Objective  Blood pressure 127/71, pulse 74, temperature 97.6 F (36.4 C),  temperature source Temporal, resp. rate 17, height _0  (1.575 m), weight 166 lb 12.8 oz (75.7 kg), SpO2 96 %.     07/01/2021    3:39 PM 05/31/2021    9:22 AM 03/29/2021   10:20 AM  Vitals with BMI  Height _1  _2  _3   Weight 166 lbs 13 oz 166 lbs 171 lbs 13 oz  BMI 30.5 82.99 37.16  Systolic 967 893 810  Diastolic 71 92 76  Pulse 74 72 80     Physical Exam Neck:     Vascular: No JVD.  Cardiovascular:     Rate and Rhythm: Normal rate and regular rhythm.     Pulses: Intact distal pulses.     Heart sounds: Normal heart sounds. No murmur heard.   No gallop.  Pulmonary:     Effort: Pulmonary effort is normal.     Breath sounds: Rales (scattered diffuse) present.  Abdominal:     General: Bowel sounds are normal.     Palpations: Abdomen is soft.  Musculoskeletal:     Right lower leg: No edema.     Left lower leg: No edema.   Laboratory examination:  External labs:   Labs 08/27/2020:  TSH normal at 1.0 vitamin D 40.9.  Total cholesterol 191, triglycerides 125, HDL 77, LDL 92.  Non-HDL cholesterol 114. Marland Kitchen  Hb 14.7/HCT 43.7, platelets 208.  Normal indicis.  Serum glucose 95 mg, BUN 26, creatinine 0.76, EGFR 88 mL, potassium 4.1.  LFTs normal.  Labs 01/31/2019: Cholesterol, total 256 Triglycerides 113 HDL 86 LDL 151  NHDL 176 Hemoglobin 14.5 Creatinine, Serum 0.61 Potassium 4.700 01/31/2019 ALT (SGPT) 30  TSH 1.190 01/31/2019  Medications and allergies   Allergies  Allergen Reactions   Crestor [Rosuvastatin] Other (See Comments)    Severe myalgia and back pain   Simvastatin Other (See Comments)    Arthralgia   Augmentin [Amoxicillin-Pot Clavulanate] Diarrhea and Other (See Comments)    Felt very bad       Current Outpatient Medications:    acetaminophen (TYLENOL) 650 MG CR tablet, Take 1,300 mg by mouth daily as needed for pain., Disp: , Rfl:    ANORO ELLIPTA 62.5-25 MCG/INH AEPB, Inhale 1 puff into the lungs daily., Disp: , Rfl:    aspirin EC 81 MG  tablet, Take 1 tablet (81 mg total) by mouth daily., Disp: 90 tablet, Rfl: 3   atorvastatin (LIPITOR) 10 MG tablet, Take 0.5 tablets (5 mg total) by mouth daily., Disp: 15 tablet, Rfl: 2   ezetimibe (ZETIA) 10 MG tablet, Take 1 tablet (10 mg total) by mouth every evening., Disp: 30 tablet, Rfl: 2   ibuprofen (ADVIL) 200 MG tablet, Take 600 mg by mouth 2 (two) times daily., Disp: , Rfl:    methocarbamol (ROBAXIN) 500 MG tablet, Take 500 mg by mouth every 6 (six) hours as needed for muscle spasms., Disp: , Rfl:    Multiple Vitamin (MULTI-VITAMIN) tablet, Take by mouth., Disp: , Rfl:    Vitamin D, Ergocalciferol, (DRISDOL) 50000 units CAPS capsule, Take 1 capsule (50,000 Units total) by mouth every 7 (seven) days., Disp: 12 capsule, Rfl: 0   calcium carbonate (TUMS - DOSED IN MG ELEMENTAL CALCIUM) 500 MG chewable tablet, Chew 1 tablet by mouth as needed for indigestion or heartburn., Disp: , Rfl:    Radiology:   Low-dose CT chest for lung cancer screening 02/14/2018: Cardiovascular: Heart size is normal. There is no significant pericardial fluid, thickening or pericardial calcification. No atherosclerotic calcifications in the thoracic aorta or the coronary arteries. Lungs/Pleura: No suspicious appearing pulmonary nodules or masses. No acute consolidative airspace disease. No pleural effusions. Mild diffuse bronchial wall thickening with mild centrilobular and paraseptal emphysema.  Cardiac Studies:   Echocardiogram 03/08/2019:  Left ventricle cavity is normal in size and wall thickness. Normal global  wall motion. Normal LV systolic function with EF 55%. Normal diastolic filling pattern.  Mild (Grade I) mitral regurgitation.  Mild tricuspid regurgitation. Estimated pulmonary artery systolic pressure  is 22 mmHg.  Lexiscan (Walking with mod Bruce)Tetrofosmin Stress Test  03/18/2019: Nondiagnostic ECG stress. Normal myocardial perfusion. All segments of left ventricle demonstrated normal  wall motion and thickening.  Stress LV EF is normal 63%.  No previous exam available for comparison. Low risk study.   EKG  EKG 03/29/2021: Normal sinus rhythm at rate of 80 bpm, incomplete right bundle branch block.  No significant change from 01/16/2019.  Assessment     ICD-10-CM   1. Coronary artery calcification  I25.10 atorvastatin (LIPITOR) 10 MG tablet   I25.84 ezetimibe (ZETIA) 10 MG tablet    2. Hypercholesteremia  E78.00 atorvastatin (LIPITOR) 10 MG tablet    ezetimibe (ZETIA) 10 MG tablet    Lipid Panel With LDL/HDL Ratio  Meds ordered this encounter  Medications   atorvastatin (LIPITOR) 10 MG tablet    Sig: Take 0.5 tablets (5 mg total) by mouth daily.    Dispense:  15 tablet    Refill:  2   ezetimibe (ZETIA) 10 MG tablet    Sig: Take 1 tablet (10 mg total) by mouth every evening.    Dispense:  30 tablet    Refill:  2    There are no discontinued medications.  Recommendations:   MAXYNE DEROCHER  is a 66 y.o. Caucasian female with 40+-pack-year history of smoking quit in 2015, still using e-cigarettes, lobular and paraseptal emphysema/COPD, coronary calcification noted on the CT scan of the chest, hyperlipidemia and GERD presents for 52-monthoffice visit in view of coronary calcification and started on a statin therapy with improvement in LDL.  Patient is now statin intolerant with severe arthralgia to both very small dose of simvastatin/Zetia combination and also to Crestor.  I will try to obtain Livalo but it was refused by her insurance.  In view of her significant risk for future cardiovascular events, and the patient was only 66years of age, I would like to try to see if she can get LDL to <100 if not closer to 70.  She is willing to try another statin, will start her on Lipitor 5 mg daily and if she tolerates this for 2 to 3 weeks, will also add Zetia 10 mg daily, will check lipids in 2 months.  Otherwise from cardiac standpoint she is stable, again  discussed complete abstinence from e-cigarettes as well, I will see her back on a as needed basis but we will certainly try to get her lipids to goal prior to discharging her to her PCP.  She has severe back pain and has been taking close to 800 mg of Motrin on a daily basis, effects on cardiovascular disease, hypertension, GI bleed discussed with the patient.  She can also discuss with her PCP regarding Celebrex 200 mg daily.    JAdrian Prows MD, FBever[facility]5/25/2023, 4:25 PM Office: [phone]: [phone]: [phone]

## 2021-07-05 NOTE — Progress Notes (Unsigned)
PROVIDER NOTE: Information contained herein reflects review and annotations entered in association with encounter. Interpretation of such information and data should be left to medically-trained personnel. Information provided to patient can be located elsewhere in the medical record under "Patient Instructions". Document created using STT-dictation technology, any transcriptional errors that may result from process are unintentional.    Patient: Samantha Cox  Service Category: E/M  Provider: Gaspar Cola, MD  DOB: [birth year]  DOS: 07/06/2021  Specialty: Interventional Pain Management  MRN: [id number]  Setting: Ambulatory outpatient  PCP: Leonel Ramsay, MD  Type: Established Patient    Referring Provider: Leonel Ramsay, MD  Location: Office  Delivery: Face-to-face     Primary Reason(s) for Visit: Encounter for evaluation before starting new chronic pain management plan of care (Level of risk: moderate) CC: No chief complaint on file.  HPI  Samantha Cox is a 66 y.o. year old, female patient, who comes today for a follow-up evaluation to review the test results and decide on a treatment plan. She has Degenerative arthritis of hip; Acetabular labrum tear; Greater trochanteric bursitis of left hip; Piriformis syndrome of left side; Morton's neuroma of left foot; Leg cramping; Degenerative lumbar spinal stenosis; Greater trochanteric bursitis of right hip; Rhinitis, chronic; Shortness of breath; Ex-cigarette smoker; Facet arthritis of lumbar region; Chronic left SI joint pain; Hamstring tendinitis at origin; Myalgia due to statin; Chronic nasal congestion; Fall from horse 4/14.; Allergic rhinitis due to pollen; Anogenital warts; Bilateral hearing loss; Cervical high risk HPV (human papillomavirus) test positive; BPPV (benign paroxysmal positional vertigo), unspecified laterality; Cervical intraepithelial neoplasia grade 1; Chronic back pain; Chronic obstructive pulmonary disease,  unspecified ([place]); Degeneration of lumbar intervertebral disc; Disequilibrium; Diverticular disease of colon; Dyslipidemia; Enthesopathy of hip region; Hardening of the aorta (main artery of the heart) ([place]); History of cervical dysplasia; Mixed incontinence; Overactive bladder; Metatarsalgia of left foot; Pain in left foot; Primary insomnia; Screening for malignant neoplasm of colon; Stenosis of cervix; Tinnitus, bilateral; Tobacco dependence in remission; Vitamin D deficiency; Chronic maxillary sinusitis; Plantar fasciitis of left foot; Chest pain; Chronic pain syndrome; Pharmacologic therapy; Disorder of skeletal system; Problems influencing health status; Abnormal MRI, lumbar spine (07/28/2019); Cervicalgia (3ry area of Pain); Chronic low back pain (1ry area of Pain) (Bilateral) w/o sciatica; Lumbar facet syndrome (Bilateral); Lumbosacral facet hypertrophy (Multilevel) (Bilateral); Chronic lower extremity pain (2ry area of Pain) (nonradicular) (Bilateral); Cervicogenic headache (4th area of Pain); Chronic shoulder pain (5th area of Pain) (Bilateral); Use of cannabinoid edibles (CBD); and Chronic hip pain (2ry area of Pain) (Bilateral) (R>L) on their problem list. Her primarily concern today is the No chief complaint on file.  Pain Assessment: Location:     Radiating:   Onset:   Duration:   Quality:   Severity:  /10 (subjective, self-reported pain score)  Effect on ADL:   Timing:   Modifying factors:   BP:    HR:    Samantha Cox comes in today for a follow-up visit after her initial evaluation on 05/31/2021. Today we went over the results of her tests. These were explained in "Layman's terms". During today's appointment we went over my diagnostic impression, as well as the proposed treatment plan.  According to the patient the primary area of pain is that of the lower back (Bilateral) (R>L). The patient denies any prior surgeries but she does admit having had some recent x-rays/MRI at the  [facility].  The patient is also currently undergoing physical therapy once a week which she  describes as being "very helpful".  She refers [facility] [place]  The patient also indicates having had prior nerve blocks which she also describes as being helpful.  Furthermore, the patient describes the low back pain has been debilitating.  The patient's secondary area pain is that of the hips (Bilateral) (R>L). She is but she does admit having had some x-rays (she refers not being sure about the this).  She denies any joint injections or nerve blocks for the hip pain.  The patient's third area pain is that of the lower extremities (Bilateral) (L>R). Indicates of the left lower extremity she has pain that goes all the way down to the posterior aspect of the knee and occasionally he will go down to her big toe (L5).  She refers that this is not usual.  She also refers having been told that she has a Morton's neuroma in her left foot.  In the case of the right lower extremity the pain runs through the back of the leg to the level of the knee but it does not go any further from there.  In both instances she denies any numbness or weakness affecting her feet.  She is currently having physical therapy not only for her back pain but also for her lower extremity symptoms.  The symptoms seem to be none radicular.  The patient denies any surgeries of the lower extremities.  The patient's fourth area pain is that of the neck in the posterior aspect (Bilateral) (L>R).  She denies any recent x-rays but she also refers that this neck pain is associated with "paroxysmal dizziness" (cervicogenic vertigo).  She refers her neck pain to occasionally be referred to the back of the head as well as the shoulders.  The patient denies any surgeries over the cervical spine.  The patient's fifth area pain is that of the shoulders (Bilateral) (L>R).  She denies any upper  extremity symptoms.  No pain, numbness, or weakness going down the arms.  The patient denies any surgeries of the shoulders.  The patient's sixth area pain is that of cervicogenic headaches where she indicates the pain to be in the occipital region (Bilateral) (L>R).  The patient does describe having had multiple nerve blocks at [place], [place] [facility], and the [facility].  I have reviewed the medical records and the only one that I can find is that of the [facility] were she had a bilateral transforaminal epidural steroid injection.  The rest of the information is not available in the electronic medical record and therefore we had the patient's sign some release forms so that we can request that information.  Physical exam today was negative for any abnormalities with toe walking or heel walking.  Straight leg raise was negative bilaterally.  Patrick maneuver was positive bilaterally for hip joint arthralgia with referral to buttocks region (Bilateral) (L>R).  Hip range of motion was decreased bilaterally but with the left being worse than the right.  In both instances the range of motion was accompanied by pain.  Patrick maneuver was equivocal for the SI joint bilaterally (L>R).  Hyperextension and rotation maneuver was positive bilaterally for ipsilateral lumbar facet joint arthralgia.  Kemp maneuver was also positive bilaterally.  Cervical spine range of motion was within normal limits but with some pain referred to the posterior lower neck region on hyperextension.  The patient also did experience some pain that was ipsilateral on rotation.  This pain  was triggered bilaterally.  Lateral bending was also positive bilaterally for contralateral neck pain.  ***  In considering the treatment plan options, Samantha Cox was reminded that I no longer take patients for medication management only. I asked her to let me know if she had no intention of taking advantage of the  interventional therapies, so that we could make arrangements to provide this space to someone interested. I also made it clear that undergoing interventional therapies for the purpose of getting pain medications is very inappropriate on the part of a patient, and it will not be tolerated in this practice. This type of behavior would suggest true addiction and therefore it requires referral to an addiction specialist.   Further details on both, my assessment(s), as well as the proposed treatment plan, please see below.  Controlled Substance Pharmacotherapy Assessment REMS (Risk Evaluation and Mitigation Strategy)  Opioid Analgesic: None MME/day: 0 mg/day  Pill Count: None expected due to no prior prescriptions written by our practice. No notes on file Pharmacokinetics: Liberation and absorption (onset of action): WNL Distribution (time to peak effect): WNL Metabolism and excretion (duration of action): WNL         Pharmacodynamics: Desired effects: Analgesia: Samantha Cox reports >50% benefit. Functional ability: Patient reports that medication allows her to accomplish basic ADLs Clinically meaningful improvement in function (CMIF): Sustained CMIF goals met Perceived effectiveness: Described as relatively effective, allowing for increase in activities of daily living (ADL) Undesirable effects: Side-effects or Adverse reactions: None reported Monitoring: [place] PMP: PDMP reviewed during this encounter. Online review of the past 70-month period previously conducted. Not applicable at this point since we have not taken over the patient's medication management yet. List of other Serum/Urine Drug Screening Test(s):  No results found for: AMPHSCRSER, BARBSCRSER, BENZOSCRSER, COCAINSCRSER, COCAINSCRNUR, PCPSCRSER, THCSCRSER, THCU, CANNABQUANT, [place], [place], [place], [place] List of all UDS test(s) done:  Lab Results  Component Value Date   SUMMARY Note 05/31/2021   Last UDS on  record: Summary  Date Value Ref Range Status  05/31/2021 Note  Final    Comment:    ==================================================================== Compliance Drug Analysis, Ur ==================================================================== Test                             Result       Flag       Units  Drug Present and Declared for Prescription Verification   Acetaminophen                  PRESENT      EXPECTED   Ibuprofen                      PRESENT      EXPECTED  Drug Present not Declared for Prescription Verification   Carboxy-THC                    997          UNEXPECTED ng/mg creat    Carboxy-THC is a metabolite of tetrahydrocannabinol (THC). Source of    THC is most commonly herbal marijuana or marijuana-based products,    but THC is also present in a scheduled prescription medication.    Trace amounts of THC can be present in hemp and cannabidiol (CBD)    products. This test is not intended to distinguish between delta-9-    tetrahydrocannabinol, the predominant form of THC in most herbal or  marijuana-based products, and delta-8-tetrahydrocannabinol.  Drug Absent but Declared for Prescription Verification   Methocarbamol                  Not Detected UNEXPECTED   Salicylate                     Not Detected UNEXPECTED    Aspirin, as indicated in the declared medication list, is not always    detected even when used as directed.  ==================================================================== Test                      Result    Flag   Units      Ref Range   Creatinine              75               mg/dL      >=20 ==================================================================== Declared Medications:  The flagging and interpretation on this report are based on the  following declared medications.  Unexpected results may arise from  inaccuracies in the declared medications.   **Note: The testing scope of this panel includes these medications:    Methocarbamol (Robaxin)   **Note: The testing scope of this panel does not include small to  moderate amounts of these reported medications:   Acetaminophen (Tylenol)  Aspirin  Ibuprofen (Advil)   **Note: The testing scope of this panel does not include the  following reported medications:   Calcium (Tums)  Multivitamin  Umeclidinium (Anoro)  Vilanterol (Anoro)  Vitamin D2 (Drisdol) ==================================================================== For clinical consultation, please call [phone]. ====================================================================    UDS interpretation: No unexpected findings.          Medication Assessment Form: Patient introduced to form today Treatment compliance: Treatment may start today if patient agrees with proposed plan. Evaluation of compliance is not applicable at this point Risk Assessment Profile: Aberrant behavior: See initial evaluations. None observed or detected today Comorbid factors increasing risk of overdose: See initial evaluation. No additional risks detected today Opioid risk tool (ORT):     05/31/2021    9:32 AM  Opioid Risk   Alcohol 0  Illegal Drugs 0  Rx Drugs 0  Alcohol 0  Rx Drugs 0  Age between 16-45 years  0  History of Preadolescent Sexual Abuse 0  Psychological Disease 0  Depression 0  Opioid Risk Tool Scoring 0  Opioid Risk Interpretation Low Risk    ORT Scoring interpretation table:  Score <3 = Low Risk for SUD  Score between 4-7 = Moderate Risk for SUD  Score >8 = High Risk for Opioid Abuse   Risk of substance use disorder (SUD): Low  Risk Mitigation Strategies:  Patient opioid safety counseling: Completed today. Counseling provided to patient as per "Patient Counseling Document". Document signed by patient, attesting to counseling and understanding Patient-Prescriber Agreement (PPA): Obtained today.  Controlled substance notification to other providers: Written and sent  today.  Pharmacologic Plan: Non-opioid analgesic therapy offered. Interventional alternatives discussed.             Laboratory Chemistry Profile   Renal Lab Results  Component Value Date   BUN 25 (H) 05/31/2021   CREATININE 0.68 05/31/2021   GFR 84.17 09/20/2016   GFRAA >60 01/16/2019   GFRNONAA >60 05/31/2021     Electrolytes Lab Results  Component Value Date   NA 139 05/31/2021   K 4.2 05/31/2021   CL 107 05/31/2021   CALCIUM  9.4 05/31/2021   MG 2.3 05/31/2021     Hepatic Lab Results  Component Value Date   AST 28 05/31/2021   ALT 29 05/31/2021   ALBUMIN 3.8 05/31/2021   ALKPHOS 79 05/31/2021     ID No results found for: LYMEIGGIGMAB, HIV, SARSCOV2NAA, STAPHAUREUS, MRSAPCR, HCVAB, PREGTESTUR, RMSFIGG, QFVRPH1IGG, QFVRPH2IGG, [facility]   Bone Lab Results  Component Value Date   VD25OH 32.18 09/20/2016   25OHVITD1 69 05/31/2021   25OHVITD2 36 05/31/2021   25OHVITD3 33 05/31/2021     Endocrine Lab Results  Component Value Date   GLUCOSE 90 05/31/2021   TSH 2.15 12/10/2019     Neuropathy Lab Results  Component Value Date   VITAMINB12 206 05/31/2021     CNS No results found for: COLORCSF, APPEARCSF, RBCCOUNTCSF, WBCCSF, POLYSCSF, LYMPHSCSF, EOSCSF, PROTEINCSF, GLUCCSF, JCVIRUS, CSFOLI, IGGCSF, LABACHR, ACETBL, LABACHR, ACETBL   Inflammation (CRP: Acute  ESR: Chronic) Lab Results  Component Value Date   CRP 0.7 05/31/2021   ESRSEDRATE 5 05/31/2021     Rheumatology Lab Results  Component Value Date   RF <14 09/20/2016   ANA POS (A) 09/20/2016   ANA POS (A) 09/20/2016     Coagulation Lab Results  Component Value Date   PLT 195.0 12/10/2019     Cardiovascular Lab Results  Component Value Date   CKTOTAL 88 12/10/2019   HGB 14.3 12/10/2019   HCT 42.1 12/10/2019     Screening No results found for: SARSCOV2NAA, COVIDSOURCE, STAPHAUREUS, MRSAPCR, HCVAB, HIV, PREGTESTUR   Cancer No results found for: CEA, CA125, LABCA2   Allergens No  results found for: ALMOND, APPLE, ASPARAGUS, AVOCADO, BANANA, BARLEY, BASIL, BAYLEAF, GREENBEAN, LIMABEAN, WHITEBEAN, BEEFIGE, REDBEET, BLUEBERRY, BROCCOLI, CABBAGE, MELON, CARROT, CASEIN, CASHEWNUT, CAULIFLOWER, CELERY     Note: Lab results reviewed.  Recent Diagnostic Imaging Review  Cervical Imaging: Cervical MR wo contrast: No results found for this or any previous visit.  Cervical MR wo contrast: No valid procedures specified. Cervical MR w/wo contrast: No results found for this or any previous visit.  Cervical CT wo contrast: No results found for this or any previous visit.  Cervical CT outside: No results found for this or any previous visit.  Cervical DG F/E views: No results found for this or any previous visit.  Cervical DG Bending/F/E views: Results for orders placed during the hospital encounter of 05/31/21  DG Cervical Spine With Flex & Extend  Narrative CLINICAL DATA:  Cervicalgia. Chronic neck pain.  EXAM: CERVICAL SPINE COMPLETE WITH FLEXION AND EXTENSION VIEWS  COMPARISON:  X-ray cervical 04/12/2019.  FINDINGS: There is no acute fracture or dislocation. Degenerative joint changes with narrowed joint space and osteophyte formation are identified in the mid to lower cervical spine from C4 through T1, unchanged. Prevertebral soft tissues are normal.  IMPRESSION: Stable degenerative joint changes of the mid to lower cervical spine compared to prior film of March 2021.   Electronically Signed By: Abelardo Diesel M.D. On: 06/01/2021 13:43   Shoulder Imaging: Shoulder-R MR w/wo contrast: No results found for this or any previous visit.  Shoulder-L MR w/wo contrast: No results found for this or any previous visit.  Shoulder-R MR wo contrast: No results found for this or any previous visit.  Shoulder-L MR wo contrast: No results found for this or any previous visit.  Shoulder-R DG: Results for orders placed during the hospital encounter of 05/31/21  DG  Shoulder Right  Narrative CLINICAL DATA:  Chronic bilateral shoulder pain, left-greater-than-right.  EXAM: RIGHT SHOULDER -  2+ VIEW  COMPARISON:  None.  FINDINGS: The glenohumeral joint space is maintained. Mild acromioclavicular joint space narrowing and peripheral osteophytosis. No acute fracture or dislocation. The visualized portion of the right lung is unremarkable.  IMPRESSION: Mild right acromioclavicular osteoarthritis.   Electronically Signed By: Yvonne Kendall M.D. On: 06/01/2021 13:27  Shoulder-L DG: Results for orders placed during the hospital encounter of 05/31/21  DG Shoulder Left  Narrative CLINICAL DATA:  Chronic bilateral shoulder pain, left-greater-than-right.  EXAM: LEFT SHOULDER - 2+ VIEW  COMPARISON:  None.  FINDINGS: The glenohumeral joint space appears maintained. Mild acromioclavicular joint space narrowing and peripheral osteophytosis. No acute fracture or dislocation. The visualized portion of the left lung is unremarkable.  IMPRESSION: Mild left acromioclavicular osteoarthritis.   Electronically Signed By: Yvonne Kendall M.D. On: 06/01/2021 13:03   Thoracic Imaging: Thoracic MR wo contrast: No results found for this or any previous visit.  Thoracic MR wo contrast: No valid procedures specified. Thoracic MR w/wo contrast: No results found for this or any previous visit.  Thoracic CT wo contrast: No results found for this or any previous visit.  Thoracic CT w/wo contrast: No results found for this or any previous visit.  Thoracic CT w/wo contrast: No results found for this or any previous visit.  Thoracic DG 4 views: No results found for this or any previous visit.  Thoracic DG: No results found for this or any previous visit.  Thoracic DG w/swimmers view: No results found for this or any previous visit.   Lumbosacral Imaging: Lumbar MR wo contrast: Results for orders placed in visit on 07/27/19  MR Lumbar Spine Wo  Contrast  Narrative CLINICAL DATA:  Low back pain.  Right lower extremity radiculopathy.  EXAM: MRI LUMBAR SPINE WITHOUT CONTRAST  TECHNIQUE: Multiplanar, multisequence MR imaging of the lumbar spine was performed. No intravenous contrast was administered.  COMPARISON:  Lumbar spine radiographs 04/12/2019. MRI lumbar spine 10/03/2016  FINDINGS: Segmentation: 5 non rib-bearing lumbar type vertebral bodies are present. The lowest fully formed vertebral body is L5.  Alignment: Slight retrolisthesis at L1-2 and L2-3 is new. Grade 1 anterolisthesis at L4-5 is stable. Rightward curvature is centered at L3-4.  Vertebrae: Edematous endplate marrow changes the progressed on the left at L3-4. Marrow signal and vertebral body heights are otherwise normal.  Conus medullaris and cauda equina: Conus extends to the L1 level. Conus and cauda equina appear normal.  Paraspinal and other soft tissues: Limited imaging the abdomen is unremarkable. There is no significant adenopathy. No solid organ lesions are present.  Disc levels:  T12-L1: Negative.  L1-2: Mild disc bulging and facet hypertrophy is present. No significant stenosis is present.  L2-3: A progressive leftward disc protrusion is present. Mild left subarticular and foraminal stenosis is new.  L3-4: A broad-based disc protrusion extends into the left neural foramen. Progressive left facet hypertrophy is noted. Moderate left and mild right foraminal narrowing have progressed. Moderate left subarticular stenosis is noted.  L4-5: A broad-based disc protrusion and advanced facet hypertrophy have progressed bilaterally. Progressive moderate right subarticular and foraminal narrowing is noted. Mild left foraminal narrowing is stable.  L5-S1: Progressive moderate facet hypertrophy is present. A shallow central disc protrusion is present. No significant stenosis is present.  IMPRESSION: 1. Progressive multilevel spondylosis  of the lumbar spine as described. 2. Mild left subarticular and foraminal stenosis at L2-3 is new. 3. Moderate left subarticular and foraminal and mild right foraminal narrowing at L3-4 have progressed. 4. Progressive moderate right  subarticular and foraminal narrowing at L4-5. This is the most significant right-sided disease. 5. Mild left foraminal narrowing at L4-5 is stable. 6. Progressive moderate facet hypertrophy at L5-S1 without significant stenosis.   Electronically Signed By: San Morelle M.D. On: 07/28/2019 14:43  Lumbar MR wo contrast: No valid procedures specified. Lumbar MR w/wo contrast: No results found for this or any previous visit.  Lumbar CT wo contrast: No results found for this or any previous visit.  Lumbar CT w/wo contrast: No results found for this or any previous visit.  Lumbar CT w/wo contrast: No results found for this or any previous visit.  Lumbar DG (Complete) 4+V: Results for orders placed in visit on 04/12/19  DG Lumbar Spine Complete  Narrative CLINICAL DATA:  Chronic low back pain.  No known injury.  EXAM: LUMBAR SPINE - COMPLETE 4+ VIEW  COMPARISON:  None.  FINDINGS: There is convex right scoliosis with the apex at L3-4. No listhesis. Loss of disc space height is present at all levels and has progressed since the prior examination. Multilevel facet arthropathy also appears worse. No fracture. Paraspinous structures demonstrate atherosclerosis.  IMPRESSION: Worsened appearance of multilevel degenerative disease.  Convex right scoliosis appears unchanged.  Atherosclerosis.   Electronically Signed By: Inge Rise M.D. On: 04/12/2019 16:03        Lumbar DG F/E views: No results found for this or any previous visit.        Lumbar DG Bending views: Results for orders placed during the hospital encounter of 05/31/21  DG Lumbar Spine Complete W/Bend  Narrative CLINICAL DATA:  Chronic low back pain.  EXAM: LUMBAR  SPINE - COMPLETE WITH BENDING VIEWS  COMPARISON:  MRI lumbar spine 07/27/2019.  FINDINGS: Lumbar scoliosis concave left. Diffuse severe multilevel disc degeneration and endplate osteophyte formation again noted. 5 mm anterolisthesis L4 on L5 noted. No acute bony abnormality. No evidence of fracture. No flexion or extension abnormality identified. Aortoiliac atherosclerotic vascular calcification.  IMPRESSION: 1. Lumbar scoliosis concave left. Diffuse severe multilevel degenerative change again noted. 5 mm anterolisthesis L4 on L5 noted. No acute bony abnormality. No flexion or extension abnormality.  2.  Aortoiliac atherosclerotic vascular disease.   Electronically Signed By: Marcello Moores  Register M.D. On: 06/01/2021 13:38        Lumbar DG Myelogram views: No results found for this or any previous visit.  Lumbar DG Myelogram: No results found for this or any previous visit.  Lumbar DG Myelogram: No results found for this or any previous visit.  Lumbar DG Myelogram: No results found for this or any previous visit.  Lumbar DG Myelogram Lumbosacral: No results found for this or any previous visit.   Sacroiliac Joint Imaging: Sacroiliac Joint DG: No results found for this or any previous visit.  Sacroiliac Joint MR w/wo contrast: No results found for this or any previous visit.  Sacroiliac Joint MR wo contrast: No results found for this or any previous visit.   Spine Imaging: MRA Spinal Canal w/ cm: No results found for this or any previous visit.  MRA Spinal Canal wo/ cm: No valid procedures specified. MRA Spinal Canal w/wo cm: No results found for this or any previous visit.   Hip Imaging: Hip-R MR w/wo contrast: No results found for this or any previous visit.  Hip-L MR w/wo contrast: No results found for this or any previous visit.  Hip-R MR wo contrast: Results for orders placed in visit on 07/27/19  MR HIP RIGHT WO CONTRAST  Narrative  CLINICAL DATA:  Chronic  hip pain.  Possible trochanteric bursitis.  EXAM: MR OF THE RIGHT HIP WITHOUT CONTRAST  TECHNIQUE: Multiplanar, multisequence MR imaging was performed. No intravenous contrast was administered.  COMPARISON:  None.  FINDINGS: Bones: No acute fracture. No dislocation. No femoral head avascular necrosis. No focal bone marrow edema. No suspicious bone lesion. Moderate arthropathy of the pubic symphysis. Minimal arthropathy of the bilateral SI joints without evidence of sacroiliitis. Partially visualized lower lumbar spondylosis, see dedicated same day lumbar spine MRI.  Articular cartilage and labrum  Articular cartilage: No chondral defect. No subchondral marrow signal changes.  Labrum: Suspect low-grade partial thickness superior labral tear (series 7, image 9), suboptimally evaluated on non arthrographic imaging.  Joint or bursal effusion  Joint effusion:  No hip joint effusion.  Bursae: Mild right peritrochanteric bursal edema without focal bursal fluid collection.  Muscles and tendons  Muscles and tendons: Mild tendinosis of the distal gluteus medius tendon (series 7, image 7) without evidence of tear. Tendinosis with low-grade tear at the right hamstring tendon origin (series 5, image 17). Remaining tendinous structures about the hip are intact.  Other findings  Miscellaneous:   Colonic diverticulosis.  IMPRESSION: 1. No acute osseous abnormality of the right hip. 2. Mild tendinosis of the gluteus medius tendon with associated mild right peritrochanteric bursal edema. 3. Tendinosis with low-grade tear at the right hamstring tendon origin. 4. Suspect low-grade partial thickness superior labral tear. 5. Colonic diverticulosis.   Electronically Signed By: Davina Poke D.O. On: 07/28/2019 18:36  Hip-L MR wo contrast: No results found for this or any previous visit.  Hip-R CT w/wo contrast: No results found for this or any previous visit.  Hip-L CT  w/wo contrast: No results found for this or any previous visit.  Hip-R CT wo contrast: No results found for this or any previous visit.  Hip-L CT wo contrast: No results found for this or any previous visit.  Hip-R DG 2-3 views: Results for orders placed during the hospital encounter of 05/31/21  DG HIP UNILAT W OR W/O PELVIS 2-3 VIEWS RIGHT  Narrative CLINICAL DATA:  Chronic bilateral hip pain with arthralgia. Right-greater-than-left.  EXAM: DG HIP (WITH OR WITHOUT PELVIS) 2-3V RIGHT  COMPARISON:  01/31/2019 pelvis and left hip radiographs.  FINDINGS: There is diffuse decreased bone mineralization. Mild superior pubic symphysis joint space narrowing degenerative change.  Mild bilateral superolateral acetabular degenerative osteophytosis. Minimal right femoral head-neck junction degenerative spurring. Mild left femoroacetabular joint space narrowing. There is an unchanged chronic ossicle at the superolateral aspect of the left acetabulum on frontal view likely degenerative.  No acute fracture or dislocation.  IMPRESSION: Mild left-greater-than-right femoroacetabular osteoarthritis.   Electronically Signed By: Yvonne Kendall M.D. On: 06/01/2021 13:39  Hip-L DG 2-3 views: Results for orders placed during the hospital encounter of 05/31/21  DG HIP UNILAT W OR W/O PELVIS 2-3 VIEWS LEFT  Narrative CLINICAL DATA:  Chronic bilateral hip pain with arthralgia.  EXAM: DG HIP (WITH OR WITHOUT PELVIS) 2-3V LEFT  COMPARISON:  X-ray hip 01/31/2019.  FINDINGS: Mild bilateral superolateral acetabular degenerative osteophytosis. Minimal right femoral head-neck junction degenerative spurring.  Mild left femoroacetabular joint space narrowing. There is an unchanged chronic ossicle at the superolateral aspect of the left acetabulum on frontal view likely degenerative. Mild-to-moderate left femoral head-neck junction degenerative osteophytes similar to prior.  Mild degenerative  changes of the pubic symphysis.  No acute fracture or dislocation.  IMPRESSION:: IMPRESSION: Mild left-greater-than-right femoroacetabular osteoarthritis.   Electronically  Signed By: Yvonne Kendall M.D. On: 06/01/2021 13:42  Hip-B DG Bilateral: No results found for this or any previous visit.   Knee Imaging: Knee-R MR w/wo contrast: No results found for this or any previous visit.  Knee-L MR w/wo contrast: No results found for this or any previous visit.  Knee-R MR wo contrast: No results found for this or any previous visit.  Knee-L MR wo contrast: No results found for this or any previous visit.  Knee-R CT w/wo contrast: No results found for this or any previous visit.  Knee-L CT w/wo contrast: No results found for this or any previous visit.  Knee-R CT wo contrast: No results found for this or any previous visit.  Knee-L CT wo contrast: No results found for this or any previous visit.  Knee-R DG 4 views: No results found for this or any previous visit.  Knee-L DG 4 views: No results found for this or any previous visit.   Ankle Imaging: Ankle-R DG Complete: No results found for this or any previous visit.  Ankle-L DG Complete: No results found for this or any previous visit.   Foot Imaging: Foot-R DG Complete: No results found for this or any previous visit.  Foot-L DG Complete: Results for orders placed in visit on 02/11/16  DG Foot Complete Left  Narrative SEE PROGRESS NOTE FOR X-RAY REPORT   Elbow Imaging: Elbow-R DG Complete: No results found for this or any previous visit.  Elbow-L DG Complete: No results found for this or any previous visit.   Wrist Imaging: Wrist-R DG Complete: No results found for this or any previous visit.  Wrist-L DG Complete: No results found for this or any previous visit.   Hand Imaging: Hand-R DG Complete: No results found for this or any previous visit.  Hand-L DG Complete: No results found for this or any  previous visit.   Complexity Note: Imaging results reviewed. Results shared with Samantha Cox, using [employer].                         Meds   Current Outpatient Medications:    acetaminophen (TYLENOL) 650 MG CR tablet, Take 1,300 mg by mouth daily as needed for pain., Disp: , Rfl:    ANORO ELLIPTA 62.5-25 MCG/INH AEPB, Inhale 1 puff into the lungs daily., Disp: , Rfl:    aspirin EC 81 MG tablet, Take 1 tablet (81 mg total) by mouth daily., Disp: 90 tablet, Rfl: 3   atorvastatin (LIPITOR) 10 MG tablet, Take 0.5 tablets (5 mg total) by mouth daily., Disp: 15 tablet, Rfl: 2   ezetimibe (ZETIA) 10 MG tablet, Take 1 tablet (10 mg total) by mouth every evening., Disp: 30 tablet, Rfl: 2   ibuprofen (ADVIL) 200 MG tablet, Take 600 mg by mouth 2 (two) times daily., Disp: , Rfl:    methocarbamol (ROBAXIN) 500 MG tablet, Take 500 mg by mouth every 6 (six) hours as needed for muscle spasms., Disp: , Rfl:    Multiple Vitamin (MULTI-VITAMIN) tablet, Take by mouth., Disp: , Rfl:    Vitamin D, Ergocalciferol, (DRISDOL) 50000 units CAPS capsule, Take 1 capsule (50,000 Units total) by mouth every 7 (seven) days., Disp: 12 capsule, Rfl: 0  ROS  Constitutional: Denies any fever or chills Gastrointestinal: No reported hemesis, hematochezia, vomiting, or acute GI distress Musculoskeletal: Denies any acute onset joint swelling, redness, loss of ROM, or weakness Neurological: No reported episodes of acute onset apraxia, aphasia, dysarthria,  agnosia, amnesia, paralysis, loss of coordination, or loss of consciousness  Allergies  Samantha Cox is allergic to crestor [rosuvastatin], simvastatin, and augmentin [amoxicillin-pot clavulanate].  PFSH  Drug: Samantha Cox  reports no history of drug use. Alcohol:  reports current alcohol use of about 1.0 standard drink per week. Tobacco:  reports that she quit smoking about 8 years ago. Her smoking use included e-cigarettes and cigarettes. She has never used  smokeless tobacco. Medical:  has a past medical history of History of tobacco use, Routine general medical examination at a health care facility, and Screening for lipoid disorders. Surgical: Samantha Cox  has a past surgical history that includes Other surgical history. Family: family history includes Asthma in her mother; Atrial fibrillation in her father; Bladder Cancer in her father; CVA (age of onset: 69) in her father; Diverticulitis in her brother; Hypertension in her mother; Lung cancer in her brother.  Constitutional Exam  General appearance: Well nourished, well developed, and well hydrated. In no apparent acute distress There were no vitals filed for this visit. BMI Assessment: Estimated body mass index is 30.51 kg/m as calculated from the following:   Height as of 07/01/21: $RemoveBef'5\' 2"'cLfsVCKLlu$  (1.575 m).   Weight as of 07/01/21: 166 lb 12.8 oz (75.7 kg).  BMI interpretation table: BMI level Category Range association with higher incidence of chronic pain  <18 kg/m2 Underweight   18.5-24.9 kg/m2 Ideal body weight   25-29.9 kg/m2 Overweight Increased incidence by 20%  30-34.9 kg/m2 Obese (Class I) Increased incidence by 68%  35-39.9 kg/m2 Severe obesity (Class II) Increased incidence by 136%  >40 kg/m2 Extreme obesity (Class III) Increased incidence by 254%   Patient's current BMI Ideal Body weight  There is no height or weight on file to calculate BMI. Ideal body weight: 50.1 kg (110 lb 7.2 oz) Adjusted ideal body weight: 60.3 kg (132 lb 15.8 oz)   BMI Readings from Last 4 Encounters:  07/01/21 30.51 kg/m  05/31/21 30.36 kg/m  03/29/21 27.73 kg/m  06/11/20 28.50 kg/m   Wt Readings from Last 4 Encounters:  07/01/21 166 lb 12.8 oz (75.7 kg)  05/31/21 166 lb (75.3 kg)  03/29/21 171 lb 12.8 oz (77.9 kg)  06/11/20 176 lb 9.6 oz (80.1 kg)    Psych/Mental status: Alert, oriented x 3 (person, place, & time)       Eyes: PERLA Respiratory: No evidence of acute respiratory  distress  Assessment & Plan  Primary Diagnosis & Pertinent Problem List: There were no encounter diagnoses.  Visit Diagnosis: No diagnosis found. Problems updated and reviewed during this visit: No problems updated.  Plan of Care  Pharmacotherapy (Medications Ordered): No orders of the defined types were placed in this encounter.  Procedure Orders    No procedure(s) ordered today   Lab Orders  No laboratory test(s) ordered today   Imaging Orders  No imaging studies ordered today   Referral Orders  No referral(s) requested today    Pharmacological management options:  Opioid Analgesics: I will not be prescribing any opioids at this time Membrane stabilizer: I will not be prescribing any at this time Muscle relaxant: I will not be prescribing any at this time NSAID: I will not be prescribing any at this time Other analgesic(s): I will not be prescribing any at this time      Interventional Therapies  Risk  Complexity Considerations:   Estimated body mass index is 30.36 kg/m as calculated from the following:   Height as of this encounter: 5'  2" (1.575 m).   Weight as of this encounter: 166 lb (75.3 kg). WNL   Planned  Pending:   Pending further evaluation   Under consideration:   Diagnostic bilateral lumbar facet MBB x1    Completed:   None at this time   Completed by other providers:   [facility] imaging)  Diagnostic bilateral L5 TFESI x1 (03/12/2021) by Girtha Hake, MD (PMR) [facility])    Therapeutic  Palliative (PRN) options:   None established     Provider-requested follow-up: No follow-ups on file. Recent Visits Date Type Provider Dept  05/31/21 Office Visit Milinda Pointer, MD [facility]  Showing recent visits within past 90 days and meeting all other requirements Future Appointments Date Type Provider Dept  07/06/21 Appointment Milinda Pointer, MD [facility]  Showing future appointments within next 90 days and  meeting all other requirements  Primary Care Physician: Leonel Ramsay, MD Note by: Gaspar Cola, MD Date: 07/06/2021; Time: 2:51 PM

## 2021-07-06 ENCOUNTER — Ambulatory Visit: Payer: BC Managed Care – PPO | Attending: Pain Medicine | Admitting: Pain Medicine

## 2021-07-06 ENCOUNTER — Ambulatory Visit
Admission: RE | Admit: 2021-07-06 | Discharge: 2021-07-06 | Disposition: A | Discharge status: Home / Self Care | Payer: BC Managed Care – PPO | Source: Ambulatory Visit | Attending: Pulmonary Disease | Admitting: Pulmonary Disease

## 2021-07-06 ENCOUNTER — Encounter: Payer: Self-pay | Admitting: Pain Medicine

## 2021-07-06 VITALS — BP 110/92 | HR 75 | Temp 98.6°F | Resp 16 | Ht 66.0 in | Wt 166.0 lb

## 2021-07-06 DIAGNOSIS — M7918 Myalgia, other site: Secondary | ICD-10-CM | POA: Insufficient documentation

## 2021-07-06 DIAGNOSIS — M25551 Pain in right hip: Secondary | ICD-10-CM | POA: Diagnosis not present

## 2021-07-06 DIAGNOSIS — M542 Cervicalgia: Secondary | ICD-10-CM | POA: Diagnosis not present

## 2021-07-06 DIAGNOSIS — M4186 Other forms of scoliosis, lumbar region: Secondary | ICD-10-CM | POA: Diagnosis not present

## 2021-07-06 DIAGNOSIS — M67951 Unspecified disorder of synovium and tendon, right thigh: Secondary | ICD-10-CM | POA: Insufficient documentation

## 2021-07-06 DIAGNOSIS — M79605 Pain in left leg: Secondary | ICD-10-CM | POA: Diagnosis not present

## 2021-07-06 DIAGNOSIS — M48061 Spinal stenosis, lumbar region without neurogenic claudication: Secondary | ICD-10-CM | POA: Diagnosis present

## 2021-07-06 DIAGNOSIS — M4316 Spondylolisthesis, lumbar region: Secondary | ICD-10-CM | POA: Insufficient documentation

## 2021-07-06 DIAGNOSIS — M5136 Other intervertebral disc degeneration, lumbar region: Secondary | ICD-10-CM | POA: Diagnosis not present

## 2021-07-06 DIAGNOSIS — M47817 Spondylosis without myelopathy or radiculopathy, lumbosacral region: Secondary | ICD-10-CM | POA: Diagnosis not present

## 2021-07-06 DIAGNOSIS — M25511 Pain in right shoulder: Secondary | ICD-10-CM | POA: Diagnosis not present

## 2021-07-06 DIAGNOSIS — M19011 Primary osteoarthritis, right shoulder: Secondary | ICD-10-CM | POA: Insufficient documentation

## 2021-07-06 DIAGNOSIS — M25552 Pain in left hip: Secondary | ICD-10-CM | POA: Diagnosis not present

## 2021-07-06 DIAGNOSIS — R937 Abnormal findings on diagnostic imaging of other parts of musculoskeletal system: Secondary | ICD-10-CM | POA: Insufficient documentation

## 2021-07-06 DIAGNOSIS — G8929 Other chronic pain: Secondary | ICD-10-CM | POA: Insufficient documentation

## 2021-07-06 DIAGNOSIS — M19012 Primary osteoarthritis, left shoulder: Secondary | ICD-10-CM | POA: Insufficient documentation

## 2021-07-06 DIAGNOSIS — S76311S Strain of muscle, fascia and tendon of the posterior muscle group at thigh level, right thigh, sequela: Secondary | ICD-10-CM | POA: Insufficient documentation

## 2021-07-06 DIAGNOSIS — R918 Other nonspecific abnormal finding of lung field: Secondary | ICD-10-CM

## 2021-07-06 DIAGNOSIS — F1290 Cannabis use, unspecified, uncomplicated: Secondary | ICD-10-CM | POA: Insufficient documentation

## 2021-07-06 DIAGNOSIS — M533 Sacrococcygeal disorders, not elsewhere classified: Secondary | ICD-10-CM | POA: Insufficient documentation

## 2021-07-06 DIAGNOSIS — G4486 Cervicogenic headache: Secondary | ICD-10-CM | POA: Diagnosis present

## 2021-07-06 DIAGNOSIS — M160 Bilateral primary osteoarthritis of hip: Secondary | ICD-10-CM | POA: Insufficient documentation

## 2021-07-06 DIAGNOSIS — N289 Disorder of kidney and ureter, unspecified: Secondary | ICD-10-CM | POA: Diagnosis not present

## 2021-07-06 DIAGNOSIS — M47816 Spondylosis without myelopathy or radiculopathy, lumbar region: Secondary | ICD-10-CM | POA: Diagnosis not present

## 2021-07-06 DIAGNOSIS — J929 Pleural plaque without asbestos: Secondary | ICD-10-CM | POA: Diagnosis not present

## 2021-07-06 DIAGNOSIS — M76891 Other specified enthesopathies of right lower limb, excluding foot: Secondary | ICD-10-CM | POA: Insufficient documentation

## 2021-07-06 DIAGNOSIS — M79604 Pain in right leg: Secondary | ICD-10-CM | POA: Insufficient documentation

## 2021-07-06 DIAGNOSIS — M545 Low back pain, unspecified: Secondary | ICD-10-CM | POA: Diagnosis not present

## 2021-07-06 DIAGNOSIS — M25512 Pain in left shoulder: Secondary | ICD-10-CM | POA: Diagnosis not present

## 2021-07-06 DIAGNOSIS — S73191S Other sprain of right hip, sequela: Secondary | ICD-10-CM | POA: Diagnosis present

## 2021-07-06 DIAGNOSIS — Z87891 Personal history of nicotine dependence: Secondary | ICD-10-CM | POA: Diagnosis not present

## 2021-07-06 DIAGNOSIS — J432 Centrilobular emphysema: Secondary | ICD-10-CM | POA: Diagnosis not present

## 2021-07-06 MED ORDER — METHOCARBAMOL 500 MG PO TABS: 500.0000 mg | ORAL_TABLET | Freq: Three times a day (TID) | ORAL | 0 refills | Status: AC

## 2021-07-06 NOTE — Progress Notes (Signed)
Safety precautions to be maintained throughout the outpatient stay will include: orient to surroundings, keep bed in low position, maintain call bell within reach at all times, provide assistance with transfer out of bed and ambulation.  

## 2021-07-06 NOTE — Patient Instructions (Signed)
______________________________________________________________________  Preparing for Procedure with Sedation  NOTICE: Due to recent regulatory changes, starting on September 07, 2020, procedures requiring intravenous (IV) sedation will no longer be performed at the [facility].  These types of procedures are required to be performed at [facility] ambulatory surgery facility.  We are very sorry for the inconvenience.  Procedure appointments are limited to planned procedures: No Prescription Refills. No disability issues will be discussed. No medication changes will be discussed.  Instructions: Oral Intake: Do not eat or drink anything for at least 8 hours prior to your procedure. (Exception: Blood Pressure Medication. See below.) Transportation: A driver is required. You may not drive yourself after the procedure. Blood Pressure Medicine: Do not forget to take your blood pressure medicine with a sip of water the morning of the procedure. If your Diastolic (lower reading) is above 100 mmHg, elective cases will be cancelled/rescheduled. Blood thinners: These will need to be stopped for procedures. Notify our staff if you are taking any blood thinners. Depending on which one you take, there will be specific instructions on how and when to stop it. Diabetics on insulin: Notify the staff so that you can be scheduled 1st case in the morning. If your diabetes requires high dose insulin, take only  of your normal insulin dose the morning of the procedure and notify the staff that you have done so. Preventing infections: Shower with an antibacterial soap the morning of your procedure. Build-up your immune system: Take 1000 mg of Vitamin C with every meal (3 times a day) the day prior to your procedure. Antibiotics: Inform the staff if you have a condition or reason that requires you to take antibiotics before dental procedures. Pregnancy: If you are pregnant, call and cancel the procedure. Sickness: If  you have a cold, fever, or any active infections, call and cancel the procedure. Arrival: You must be in the facility at least 30 minutes prior to your scheduled procedure. Children: Do not bring children with you. Dress appropriately: There is always the possibility that your clothing may get soiled. Valuables: Do not bring any jewelry or valuables.  Reasons to call and reschedule or cancel your procedure: (Following these recommendations will minimize the risk of a serious complication.) Surgeries: Avoid having procedures within 2 weeks of any surgery. (Avoid for 2 weeks before or after any surgery). Flu Shots: Avoid having procedures within 2 weeks of a flu shots. (Avoid for 2 weeks before or after immunizations). Barium: Avoid having a procedure within 7-10 days after having had a radiological study involving the use of radiological contrast. (Myelograms, Barium swallow or enema study). Heart attacks: Avoid any elective procedures or surgeries for the initial 6 months after a "Myocardial Infarction" (Heart Attack). Blood thinners: It is imperative that you stop these medications before procedures. Let us know if you if you take any blood thinner.  Infection: Avoid procedures during or within two weeks of an infection (including chest colds or gastrointestinal problems). Symptoms associated with infections include: Localized redness, fever, chills, night sweats or profuse sweating, burning sensation when voiding, cough, congestion, stuffiness, runny nose, sore throat, diarrhea, nausea, vomiting, cold or Flu symptoms, recent or current infections. It is specially important if the infection is over the area that we intend to treat. Heart and lung problems: Symptoms that may suggest an active cardiopulmonary problem include: cough, chest pain, breathing difficulties or shortness of breath, dizziness, ankle swelling, uncontrolled high or unusually low blood pressure, and/or palpitations. If you are    experiencing any of these symptoms, cancel your procedure and contact your primary care physician for an evaluation.  Remember:  Regular Business hours are:  Monday to Thursday 8:00 AM to 4:00 PM  Provider's Schedule: Solimar Maiden, MD:  Procedure days: Tuesday and Thursday 7:30 AM to 4:00 PM  Bilal Lateef, MD:  Procedure days: Monday and Wednesday 7:30 AM to 4:00 PM ______________________________________________________________________  ____________________________________________________________________________________________  General Risks and Possible Complications  Patient Responsibilities: It is important that you read this as it is part of your informed consent. It is our duty to inform you of the risks and possible complications associated with treatments offered to you. It is your responsibility as a patient to read this and to ask questions about anything that is not clear or that you believe was not covered in this document.  Patient's Rights: You have the right to refuse treatment. You also have the right to change your mind, even after initially having agreed to have the treatment done. However, under this last option, if you wait until the last second to change your mind, you may be charged for the materials used up to that point.  Introduction: Medicine is not an exact science. Everything in Medicine, including the lack of treatment(s), carries the potential for danger, harm, or loss (which is by definition: Risk). In Medicine, a complication is a secondary problem, condition, or disease that can aggravate an already existing one. All treatments carry the risk of possible complications. The fact that a side effects or complications occurs, does not imply that the treatment was conducted incorrectly. It must be clearly understood that these can happen even when everything is done following the highest safety standards.  No treatment: You can choose not to proceed with the  proposed treatment alternative. The "PRO(s)" would include: avoiding the risk of complications associated with the therapy. The "CON(s)" would include: not getting any of the treatment benefits. These benefits fall under one of three categories: diagnostic; therapeutic; and/or palliative. Diagnostic benefits include: getting information which can ultimately lead to improvement of the disease or symptom(s). Therapeutic benefits are those associated with the successful treatment of the disease. Finally, palliative benefits are those related to the decrease of the primary symptoms, without necessarily curing the condition (example: decreasing the pain from a flare-up of a chronic condition, such as incurable terminal cancer).  General Risks and Complications: These are associated to most interventional treatments. They can occur alone, or in combination. They fall under one of the following six (6) categories: no benefit or worsening of symptoms; bleeding; infection; nerve damage; allergic reactions; and/or death. No benefits or worsening of symptoms: In Medicine there are no guarantees, only probabilities. No healthcare provider can ever guarantee that a medical treatment will work, they can only state the probability that it may. Furthermore, there is always the possibility that the condition may worsen, either directly, or indirectly, as a consequence of the treatment. Bleeding: This is more common if the patient is taking a blood thinner, either prescription or over the counter (example: Goody Powders, Fish oil, Aspirin, Garlic, etc.), or if suffering a condition associated with impaired coagulation (example: Hemophilia, cirrhosis of the liver, low platelet counts, etc.). However, even if you do not have one on these, it can still happen. If you have any of these conditions, or take one of these drugs, make sure to notify your treating physician. Infection: This is more common in patients with a compromised  immune system, either due to disease (example:   diabetes, cancer, human immunodeficiency virus [HIV], etc.), or due to medications or treatments (example: therapies used to treat cancer and rheumatological diseases). However, even if you do not have one on these, it can still happen. If you have any of these conditions, or take one of these drugs, make sure to notify your treating physician. Nerve Damage: This is more common when the treatment is an invasive one, but it can also happen with the use of medications, such as those used in the treatment of cancer. The damage can occur to small secondary nerves, or to large primary ones, such as those in the spinal cord and brain. This damage may be temporary or permanent and it may lead to impairments that can range from temporary numbness to permanent paralysis and/or brain death. Allergic Reactions: Any time a substance or material comes in contact with our body, there is the possibility of an allergic reaction. These can range from a mild skin rash (contact dermatitis) to a severe systemic reaction (anaphylactic reaction), which can result in death. Death: In general, any medical intervention can result in death, most of the time due to an unforeseen complication. ____________________________________________________________________________________________  

## 2021-07-14 ENCOUNTER — Ambulatory Visit: Payer: BC Managed Care – PPO | Admitting: Pain Medicine

## 2021-07-19 ENCOUNTER — Ambulatory Visit: Payer: BC Managed Care – PPO | Admitting: Pain Medicine

## 2021-07-26 DIAGNOSIS — Z789 Other specified health status: Secondary | ICD-10-CM | POA: Diagnosis not present

## 2021-07-26 DIAGNOSIS — E7849 Other hyperlipidemia: Secondary | ICD-10-CM | POA: Diagnosis not present

## 2021-07-26 DIAGNOSIS — M545 Low back pain, unspecified: Secondary | ICD-10-CM | POA: Diagnosis not present

## 2021-07-26 DIAGNOSIS — J449 Chronic obstructive pulmonary disease, unspecified: Secondary | ICD-10-CM | POA: Diagnosis not present

## 2021-07-27 ENCOUNTER — Ambulatory Visit: Payer: BC Managed Care – PPO | Attending: Pain Medicine | Admitting: Pain Medicine

## 2021-07-27 ENCOUNTER — Encounter: Payer: Self-pay | Admitting: Pain Medicine

## 2021-07-27 ENCOUNTER — Other Ambulatory Visit: Payer: Self-pay

## 2021-07-27 ENCOUNTER — Ambulatory Visit
Admission: RE | Admit: 2021-07-27 | Discharge: 2021-07-27 | Disposition: A | Discharge status: Home / Self Care | Payer: BC Managed Care – PPO | Source: Ambulatory Visit | Attending: Pain Medicine | Admitting: Pain Medicine

## 2021-07-27 VITALS — BP 127/72 | HR 69 | Temp 97.2°F | Resp 16 | Ht 65.5 in | Wt 166.0 lb

## 2021-07-27 DIAGNOSIS — M545 Low back pain, unspecified: Secondary | ICD-10-CM | POA: Diagnosis not present

## 2021-07-27 DIAGNOSIS — M47817 Spondylosis without myelopathy or radiculopathy, lumbosacral region: Secondary | ICD-10-CM

## 2021-07-27 DIAGNOSIS — Z79899 Other long term (current) drug therapy: Secondary | ICD-10-CM | POA: Diagnosis not present

## 2021-07-27 DIAGNOSIS — G8929 Other chronic pain: Secondary | ICD-10-CM

## 2021-07-27 DIAGNOSIS — M51369 Other intervertebral disc degeneration, lumbar region without mention of lumbar back pain or lower extremity pain: Secondary | ICD-10-CM

## 2021-07-27 DIAGNOSIS — M47898 Other spondylosis, sacral and sacrococcygeal region: Secondary | ICD-10-CM

## 2021-07-27 DIAGNOSIS — M4316 Spondylolisthesis, lumbar region: Secondary | ICD-10-CM

## 2021-07-27 DIAGNOSIS — M533 Sacrococcygeal disorders, not elsewhere classified: Secondary | ICD-10-CM | POA: Diagnosis not present

## 2021-07-27 DIAGNOSIS — M4186 Other forms of scoliosis, lumbar region: Secondary | ICD-10-CM | POA: Diagnosis not present

## 2021-07-27 DIAGNOSIS — M5136 Other intervertebral disc degeneration, lumbar region: Secondary | ICD-10-CM

## 2021-07-27 DIAGNOSIS — M47816 Spondylosis without myelopathy or radiculopathy, lumbar region: Secondary | ICD-10-CM | POA: Diagnosis not present

## 2021-07-27 MED ORDER — LACTATED RINGERS IV SOLN: Freq: Once | INTRAVENOUS | Status: AC

## 2021-07-27 MED ORDER — LIDOCAINE HCL 2 % IJ SOLN
20.0000 mL | Freq: Once | INTRAMUSCULAR | Status: AC
  Administered 2021-07-27: 400 mg

## 2021-07-27 MED ORDER — LIDOCAINE HCL 2 % IJ SOLN
INTRAMUSCULAR | Status: AC
  Filled 2021-07-27: qty 20

## 2021-07-27 MED ORDER — FENTANYL CITRATE (PF) 100 MCG/2ML IJ SOLN
INTRAMUSCULAR | Status: AC
Start: 2021-07-27 — End: ?
  Filled 2021-07-27: qty 2

## 2021-07-27 MED ORDER — TRIAMCINOLONE ACETONIDE 40 MG/ML IJ SUSP
INTRAMUSCULAR | Status: AC
  Filled 2021-07-27: qty 1

## 2021-07-27 MED ORDER — ROPIVACAINE HCL 2 MG/ML IJ SOLN
INTRAMUSCULAR | Status: AC
  Filled 2021-07-27: qty 20

## 2021-07-27 MED ORDER — METHYLPREDNISOLONE ACETATE 40 MG/ML IJ SUSP
INTRAMUSCULAR | Status: AC
  Filled 2021-07-27: qty 1

## 2021-07-27 MED ORDER — MIDAZOLAM HCL 5 MG/5ML IJ SOLN
INTRAMUSCULAR | Status: AC
  Filled 2021-07-27: qty 5

## 2021-07-27 MED ORDER — FENTANYL CITRATE (PF) 100 MCG/2ML IJ SOLN: 25.0000 ug | INTRAMUSCULAR | Status: DC | PRN

## 2021-07-27 MED ORDER — TRIAMCINOLONE ACETONIDE 40 MG/ML IJ SUSP
80.0000 mg | Freq: Once | INTRAMUSCULAR | Status: AC
  Administered 2021-07-27: 80 mg

## 2021-07-27 MED ORDER — ROPIVACAINE HCL 2 MG/ML IJ SOLN
9.0000 mL | Freq: Once | INTRAMUSCULAR | Status: AC
  Administered 2021-07-27: 9 mL via INTRA_ARTICULAR

## 2021-07-27 MED ORDER — ROPIVACAINE HCL 2 MG/ML IJ SOLN
18.0000 mL | Freq: Once | INTRAMUSCULAR | Status: AC
  Administered 2021-07-27: 18 mL via PERINEURAL

## 2021-07-27 MED ORDER — METHYLPREDNISOLONE ACETATE 80 MG/ML IJ SUSP
80.0000 mg | Freq: Once | INTRAMUSCULAR | Status: AC
  Administered 2021-07-27: 80 mg via INTRA_ARTICULAR

## 2021-07-27 MED ORDER — MIDAZOLAM HCL 5 MG/5ML IJ SOLN
0.5000 mg | Freq: Once | INTRAMUSCULAR | Status: AC
  Administered 2021-07-27: 1 mg via INTRAVENOUS
  Administered 2021-07-27: 2 mg via INTRAVENOUS

## 2021-07-27 NOTE — Progress Notes (Signed)
PROVIDER NOTE: Interpretation of information contained herein should be left to medically-trained personnel. Specific patient instructions are provided elsewhere under "Patient Instructions" section of medical record. This document was created in part using STT-dictation technology, any transcriptional errors that may result from this process are unintentional.  Patient: Samantha Cox Type: Established DOB: 02/13/1955 MRN: [id number] PCP: Mick Sell, MD  Service: Procedure DOS: 07/27/2021 Setting: Ambulatory Location: Ambulatory outpatient facility Delivery: Face-to-face Provider: Oswaldo Done, MD Specialty: Interventional Pain Management Specialty designation: 09 Location: Outpatient facility Ref. Prov.: Mick Sell, MD    Primary Reason for Visit: Interventional Pain Management Treatment. CC: Back Pain (Lumbar bilateral )    Procedure:          Anesthesia, Analgesia, Anxiolysis:  Procedure #1: Type: Medial Branch Facet Block #1 Primary Purpose: Diagnostic Region: Lumbar Level: L2, L3, L4, L5, & S1 Medial Branch Level(s) Target Area: For Lumbar Facet blocks, the target is the groove formed by the junction of the transverse process and superior articular process. For the L5 dorsal ramus, the target is the notch between superior articular process and sacral ala. For the S1 dorsal ramus, the target is the superior and lateral edge of the posterior S1 Sacral foramen. Approach: Posterior, paramedial, percutaneous approach. Laterality: Bilateral  Procedure #2: Type: Sacroiliac Joint Block  #1  Primary Purpose: Diagnostic Region: Posterior Lumbosacral Level: PSIS (Posterior Superior Iliac Spine) Sacroiliac Joint Target Area: For upper sacroiliac joint block(s), the target is the superior and posterior margin of the sacroiliac joint. Approach: Ipsilateral approach. Laterality: Left  Anesthesia: Local (1-2% Lidocaine)  Anxiolysis: IV  Sedation: Moderate   Guidance: Fluoroscopy           Position: Prone   1. Lumbar facet syndrome (Bilateral)   2. Spondylosis without myelopathy or radiculopathy, lumbosacral region   3. Lumbosacral facet hypertrophy (Multilevel) (Bilateral)   4. Grade 1 Anterolisthesis of lumbar spine (L4/L5) (5 mm)   5. DDD (degenerative disc disease), lumbar   6. Dextroscoliosis of lumbar spine (apex at L3-4)   7. Chronic SI joint pain (Left)   8. Other spondylosis, sacral and sacrococcygeal region   9. Chronic low back pain (1ry area of Pain) (Bilateral) w/o sciatica    NAS-11 Pain score:   Pre-procedure: 3 /10   Post-procedure: 0-No pain/10   NOTE: Non-Compliant with instructions to stay still and avoid moving. Patient in more than one occasion attempted to reach back into the sterile field. Constant twisting and movement of torso and legs. Procedure time and x-ray exposure significantly increased be non-compliance. Poor candidate for RFA under current conditions.    Pre-op H&P Assessment:  [name] is a 66 y.o. (year old), female patient, seen today for interventional treatment. She  has a past surgical history that includes Other surgical history. [name] has a current medication list which includes the following prescription(s): acetaminophen, anoro ellipta, atorvastatin, ibuprofen, methocarbamol, multi-vitamin, NON FORMULARY, and vitamin d (ergocalciferol), and the following Facility-Administered Medications: fentanyl. Her primarily concern today is the Back Pain (Lumbar bilateral )  Initial Vital Signs:  Pulse/HCG Rate: 69ECG Heart Rate: 72 Temp: (!) 97.1 F (36.2 C) Resp: 16 BP: 115/78 SpO2: 97 %  BMI: Estimated body mass index is 27.2 kg/m as calculated from the following:   Height as of this encounter: 5' 5.5" (1.664 m).   Weight as of this encounter: 166 lb (75.3 kg).  Risk Assessment: Allergies: Reviewed. She is allergic to crestor [rosuvastatin], simvastatin, and augmentin  [amoxicillin-pot clavulanate].  Allergy Precautions: None required Coagulopathies: Reviewed. None identified.  Blood-thinner therapy: None at this time Active Infection(s): Reviewed. None identified. [name] is afebrile  Site Confirmation: [name] was asked to confirm the procedure and laterality before marking the site Procedure checklist: Completed Consent: Before the procedure and under the influence of no sedative(s), amnesic(s), or anxiolytics, the patient was informed of the treatment options, risks and possible complications. To fulfill our ethical and legal obligations, as recommended by the American Medical Association's Code of Ethics, I have informed the patient of my clinical impression; the nature and purpose of the treatment or procedure; the risks, benefits, and possible complications of the intervention; the alternatives, including doing nothing; the risk(s) and benefit(s) of the alternative treatment(s) or procedure(s); and the risk(s) and benefit(s) of doing nothing. The patient was provided information about the general risks and possible complications associated with the procedure. These may include, but are not limited to: failure to achieve desired goals, infection, bleeding, organ or nerve damage, allergic reactions, paralysis, and death. In addition, the patient was informed of those risks and complications associated to Spine-related procedures, such as failure to decrease pain; infection (i.e.: Meningitis, epidural or intraspinal abscess); bleeding (i.e.: epidural hematoma, subarachnoid hemorrhage, or any other type of intraspinal or peri-dural bleeding); organ or nerve damage (i.e.: Any type of peripheral nerve, nerve root, or spinal cord injury) with subsequent damage to sensory, motor, and/or autonomic systems, resulting in permanent pain, numbness, and/or weakness of one or several areas of the body; allergic reactions; (i.e.: anaphylactic reaction); and/or  death. Furthermore, the patient was informed of those risks and complications associated with the medications. These include, but are not limited to: allergic reactions (i.e.: anaphylactic or anaphylactoid reaction(s)); adrenal axis suppression; blood sugar elevation that in diabetics may result in ketoacidosis or comma; water retention that in patients with history of congestive heart failure may result in shortness of breath, pulmonary edema, and decompensation with resultant heart failure; weight gain; swelling or edema; medication-induced neural toxicity; particulate matter embolism and blood vessel occlusion with resultant organ, and/or nervous system infarction; and/or aseptic necrosis of one or more joints. Finally, the patient was informed that Medicine is not an exact science; therefore, there is also the possibility of unforeseen or unpredictable risks and/or possible complications that may result in a catastrophic outcome. The patient indicated having understood very clearly. We have given the patient no guarantees and we have made no promises. Enough time was given to the patient to ask questions, all of which were answered to the patient's satisfaction. Ms. Pfeil has indicated that she wanted to continue with the procedure. Attestation: I, the ordering provider, attest that I have discussed with the patient the benefits, risks, side-effects, alternatives, likelihood of achieving goals, and potential problems during recovery for the procedure that I have provided informed consent. Date  Time: 07/27/2021  8:36 AM  Pre-Procedure Preparation:  Monitoring: As per clinic protocol. Respiration, ETCO2, SpO2, BP, heart rate and rhythm monitor placed and checked for adequate function Safety Precautions: Patient was assessed for positional comfort and pressure points before starting the procedure. Time-out: I initiated and conducted the "Time-out" before starting the procedure, as per protocol. The  patient was asked to participate by confirming the accuracy of the "Time Out" information. Verification of the correct person, site, and procedure were performed and confirmed by me, the nursing staff, and the patient. "Time-out" conducted as per Joint Commission's Universal Protocol (UP.01.01.01). Time: 1034  Description of Procedure #1:  Time-out: "Time-out" completed before starting procedure, as per protocol. Area Prepped: Entire Posterior Lumbosacral Region DuraPrep (Iodine Povacrylex [0.7% available iodine] and Isopropyl Alcohol, 74% w/w) Safety Precautions: Aspiration looking for blood return was conducted prior to all injections. At no point did we inject any substances, as a needle was being advanced. No attempts were made at seeking any paresthesias. Safe injection practices and needle disposal techniques used. Medications properly checked for expiration dates. SDV (single dose vial) medications used.  Description of the Procedure: Protocol guidelines were followed. The patient was placed in position over the fluoroscopy table. The target area was identified and the area prepped in the usual manner. Skin & deeper tissues infiltrated with local anesthetic. Appropriate amount of time allowed to pass for local anesthetics to take effect. The procedure needle was introduced through the skin, ipsilateral to the reported pain, and advanced to the target area. Employing the "Medial Branch Technique", the needles were advanced to the angle made by the superior and medial portion of the transverse process, and the lateral and inferior portion of the superior articulating process of the targeted vertebral bodies. This area is known as "Burton's Eye" or the "Eye of the [place] Dog". A procedure needle was introduced through the skin, and this time advanced to the angle made by the superior and medial border of the sacral ala, and the lateral border of the S1 vertebral body. This last needle was later  repositioned at the superior and lateral border of the posterior S1 foramen. Negative aspiration confirmed. Solution injected in intermittent fashion, asking for systemic symptoms every 0.5cc of injectate. The needles were then removed and the area cleansed, making sure to leave some of the prepping solution back to take advantage of its long term bactericidal properties. Start Time: 1034 hrs. Materials:  Needle(s) Type: Spinal Needle Gauge: 22G Length: 3.5-in Medication(s): Please see orders for medications and dosing details.  Description of Procedure # 2:   Area Prepped: Entire Posterior Lumbosacral Region DuraPrep (Iodine Povacrylex [0.7% available iodine] and Isopropyl Alcohol, 74% w/w) Safety Precautions: Aspiration looking for blood return was conducted prior to all injections. At no point did we inject any substances, as a needle was being advanced. No attempts were made at seeking any paresthesias. Safe injection practices and needle disposal techniques used. Medications properly checked for expiration dates. SDV (single dose vial) medications used. Description of the Procedure: Protocol guidelines were followed. The patient was placed in position over the fluoroscopy table. The target area was identified and the area prepped in the usual manner. Skin & deeper tissues infiltrated with local anesthetic. Appropriate amount of time allowed to pass for local anesthetics to take effect. The procedure needle was advanced under fluoroscopic guidance into the sacroiliac joint until a firm endpoint was obtained. Proper needle placement secured. Negative aspiration confirmed. Solution injected in intermittent fashion, asking for systemic symptoms every 0.5cc of injectate. The needles were then removed and the area cleansed, making sure to leave some of the prepping solution back to take advantage of its long term bactericidal properties. Vitals:   07/27/21 1048 07/27/21 1057 07/27/21 1107 07/27/21 1117   BP: 119/82 129/84 129/79 127/72  Pulse: 69     Resp: (!) 22 19 16 16   Temp:  (!) 97.2 F (36.2 C)    TempSrc:      SpO2: 100% 95% 96% 100%  Weight:      Height:        End Time:   hrs. Materials:  Needle(s)  Type: Spinal Needle Gauge: 22G Length: 5.0-in Medication(s): Please see orders for medications and dosing details.  Imaging Guidance (Spinal):          Type of Imaging Technique: Fluoroscopy Guidance (Spinal) Indication(s): Assistance in needle guidance and placement for procedures requiring needle placement in or near specific anatomical locations not easily accessible without such assistance. Exposure Time: Please see nurses notes. Contrast: None used. Fluoroscopic Guidance: I was personally present during the use of fluoroscopy. "Tunnel Vision Technique" used to obtain the best possible view of the target area. Parallax error corrected before commencing the procedure. "Direction-depth-direction" technique used to introduce the needle under continuous pulsed fluoroscopy. Once target was reached, antero-posterior, oblique, and lateral fluoroscopic projection used confirm needle placement in all planes. Images permanently stored in EMR. Interpretation: No contrast injected. I personally interpreted the imaging intraoperatively. Adequate needle placement confirmed in multiple planes. Permanent images saved into the patient's record.  Antibiotic Prophylaxis:   Anti-infectives (From admission, onward)    None      Indication(s): None identified  Post-operative Assessment:  Post-procedure Vital Signs:  Pulse/HCG Rate: 69 (nsr)69 Temp: (!) 97.2 F (36.2 C) Resp: 16 BP: 127/72 SpO2: 100 %  EBL: None  Complications: No immediate post-treatment complications observed by team, or reported by patient.  Note: The patient tolerated the entire procedure well. A repeat set of vitals were taken after the procedure and the patient was kept under observation following  institutional policy, for this type of procedure. Post-procedural neurological assessment was performed, showing return to baseline, prior to discharge. The patient was provided with post-procedure discharge instructions, including a section on how to identify potential problems. Should any problems arise concerning this procedure, the patient was given instructions to immediately contact us, at any time, without hesitation. In any case, we plan to contact the patient by telephone for a follow-up status report regarding this interventional procedure.  Comments:  No additional relevant information.  Plan of Care  Orders:  Orders Placed This Encounter  Procedures   LUMBAR FACET(MEDIAL BRANCH NERVE BLOCK) MBNB    Scheduling Instructions:     Procedure: Lumbar facet block (AKA.: Lumbosacral medial branch nerve block)     Side: Bilateral     Level: L3-4 & L5-S1 Facets (L2, L3, L4, L5, & S1 Medial Branch Nerves)     Sedation: Patient's choice.     Timeframe: Today    Order Specific Question:   Where will this procedure be performed?    Answer:   [facility]   SACROILIAC JOINT INJECTION    Scheduling Instructions:     Side: Left-sided     Sedation: With Sedation.     Timeframe: Today    Order Specific Question:   Where will this procedure be performed?    Answer:   [facility]   DG PAIN CLINIC C-ARM 1-60 MIN NO REPORT    Intraoperative interpretation by procedural physician at [facility].    Standing Status:   Standing    Number of Occurrences:   1    Order Specific Question:   Reason for exam:    Answer:   Assistance in needle guidance and placement for procedures requiring needle placement in or near specific anatomical locations not easily accessible without such assistance.   Informed Consent Details: Physician/Practitioner Attestation; Transcribe to consent form and obtain patient signature    Nursing Order: Transcribe to consent form and obtain patient  signature. Note: Always confirm laterality of pain with Ms.  Danek, before procedure.    Order Specific Question:   Physician/Practitioner attestation of informed consent for procedure/surgical case    Answer:   I, the physician/practitioner, attest that I have discussed with the patient the benefits, risks, side effects, alternatives, likelihood of achieving goals and potential problems during recovery for the procedure that I have provided informed consent.    Order Specific Question:   Procedure    Answer:   Lumbar Facet Block  under fluoroscopic guidance    Order Specific Question:   Physician/Practitioner performing the procedure    Answer:   Edras Wilford A. Dossie Arbour MD    Order Specific Question:   Indication/Reason    Answer:   Low Back Pain, with our without leg pain, due to Facet Joint Arthralgia (Joint Pain) Spondylosis (Arthritis of the Spine), without myelopathy or radiculopathy (Nerve Damage).   Provide equipment / supplies at bedside    "Block Tray" (Disposable  single use) Needle type: SpinalSpinal Amount/quantity: 4 Size: Medium (5-inch) Gauge: 22G    Standing Status:   Standing    Number of Occurrences:   1    Order Specific Question:   Specify    Answer:   Block Tray   Informed Consent Details: Physician/Practitioner Attestation; Transcribe to consent form and obtain patient signature    Nursing Order: Transcribe to consent form and obtain patient signature. Note: Always confirm laterality of pain with Ms. Lyall, before procedure.    Order Specific Question:   Physician/Practitioner attestation of informed consent for procedure/surgical case    Answer:   I, the physician/practitioner, attest that I have discussed with the patient the benefits, risks, side effects, alternatives, likelihood of achieving goals and potential problems during recovery for the procedure that I have provided informed consent.    Order Specific Question:   Procedure    Answer:   Sacroiliac  Joint Block    Order Specific Question:   Physician/Practitioner performing the procedure    Answer:   [place] A. Dossie Arbour, MD    Order Specific Question:   Indication/Reason    Answer:   Chronic Low Back and Hip Pain secondary to Sacroiliac Joint Pain (Arthralgia/Arthropathy)   Chronic Opioid Analgesic:  None MME/day: 0 mg/day   Medications ordered for procedure: Meds ordered this encounter  Medications   lidocaine (XYLOCAINE) 2 % (with pres) injection 400 mg   lactated ringers infusion   midazolam (VERSED) 5 MG/5ML injection 0.5-2 mg    Make sure Flumazenil is available in the pyxis when using this medication. If oversedation occurs, administer 0.2 mg IV over 15 sec. If after 45 sec no response, administer 0.2 mg again over 1 min; may repeat at 1 min intervals; not to exceed 4 doses (1 mg)   fentaNYL (SUBLIMAZE) injection 25-50 mcg    Make sure Narcan is available in the pyxis when using this medication. In the event of respiratory depression (RR< 8/min): Titrate NARCAN (naloxone) in increments of 0.1 to 0.2 mg IV at 2-3 minute intervals, until desired degree of reversal.   ropivacaine (PF) 2 mg/mL (0.2%) (NAROPIN) injection 18 mL   triamcinolone acetonide (KENALOG-40) injection 80 mg   ropivacaine (PF) 2 mg/mL (0.2%) (NAROPIN) injection 9 mL   methylPREDNISolone acetate (DEPO-MEDROL) injection 80 mg   Medications administered: We administered lidocaine, lactated ringers, midazolam, ropivacaine (PF) 2 mg/mL (0.2%), triamcinolone acetonide, ropivacaine (PF) 2 mg/mL (0.2%), and methylPREDNISolone acetate.  See the medical record for exact dosing, route, and time of administration.  Follow-up plan:   Return  in about 2 weeks (around 08/10/2021) for Proc-day (T,Th), (F2F), (PPE).       Interventional Therapies  Risk  Complexity Considerations:   Estimated body mass index is 30.36 kg/m as calculated from the following:   Height as of this encounter: 5\' 2"  (1.575 m).   Weight as of  this encounter: 166 lb (75.3 kg). (05/31/2021) Abnormal UDS (+) Carboxy-THC (MARIJUANA)  POOR RFA Candidate. Non-compliant w/ instructions not to move.    Planned  Pending:   Diagnostic bilateral lumbar facet MBB #1 + left SI joint Blk #1    Under consideration:   Diagnostic bilateral lumbar facet RFA. POOR RFA Candidate. Non-compliant w/ instructions not to move.    Completed:   Diagnostic bilateral lumbar facet MBB x1 (07/27/2021) (Non-compliant: Constant movement)  Diagnostic left SI joint Blk x1 (07/27/2021) (Non-compliant: Constant movement)    Completed by other providers:   ([facility])  [place] (Ultrasound-guided) right greater trochanteric inj. x1 by Dr. Gregor Hams (02/04/2020)  [place] right greater trochanteric bursa inj. x3 by Dr. Hulan Saas (11/25/2014; 07/10/2018; 10/19/2018)  [place] right SI joint inj. x1 by Dr. Gregor Hams (06/05/2019)  [place] left gluteus medius insertion greater trochanteric inj. x1 by Dr. Gregor Hams (01/29/2019)  [place] left piriformis tendon sheath inj. x3 by Dr. Hulan Saas (12/02/2016; 08/23/2017; 02/15/2018)  [place] left foot neuroma inj. x1 (2 neuromas between 3rd & 4th, and 4th & 5th toes) by Dr. Hulan Saas (07/07/2016)  [place] right hip joint inj. x1 by Dr. Hulan Saas (12/04/2014)  Diagnostic bilateral L5 TFESI x1 (03/12/2021) by Girtha Hake, MD (PMR) [facility])  Diagnostic left L5 dorsal ramus and S1, S2, and S3 lateral branch Blk x1, by Charlett Blake, MD (09/10/2019)  Therapeutic left L5 dorsal ramus and S1, S2, and S3 lateral branch RFA x2, by Charlett Blake, MD (10/04/2019 & 04/10/2020) (1st provide her with 50% relief of the pain.  2nd made things worse)   Therapeutic  Palliative (PRN) options:   None established     Recent Visits Date Type Provider Dept  07/06/21 Office Visit Milinda Pointer, MD [facility]  05/31/21 Office Visit Milinda Pointer, MD [facility]  Showing recent visits within past 90 days  and meeting all other requirements Today's Visits Date Type Provider Dept  07/27/21 Procedure visit Milinda Pointer, MD [facility]  Showing today's visits and meeting all other requirements Future Appointments Date Type Provider Dept  08/19/21 Appointment Milinda Pointer, MD [facility]  Showing future appointments within next 90 days and meeting all other requirements  Disposition: Discharge home  Discharge (Date  Time): 07/27/2021; 1118 hrs.   Primary Care Physician: Leonel Ramsay, MD Location: [facility] Note by: Gaspar Cola, MD Date: 07/27/2021; Time: 11:36 AM  Disclaimer:  Medicine is not an exact science. The only guarantee in medicine is that nothing is guaranteed. It is important to note that the decision to proceed with this intervention was based on the information collected from the patient. The Data and conclusions were drawn from the patient's questionnaire, the interview, and the physical examination. Because the information was provided in large part by the patient, it cannot be guaranteed that it has not been purposely or unconsciously manipulated. Every effort has been made to obtain as much relevant data as possible for this evaluation. It is important to note that the conclusions that lead to this procedure are derived in large part from the available data.  Always take into account that the treatment will also be dependent on availability of resources and existing treatment guidelines, considered by other Pain Management Practitioners as being common knowledge and practice, at the time of the intervention. For Medico-Legal purposes, it is also important to point out that variation in procedural techniques and pharmacological choices are the acceptable norm. The indications, contraindications, technique, and results of the above procedure should only be interpreted and judged by a Board-Certified Interventional  Pain Specialist with extensive familiarity and expertise in the same exact procedure and technique.

## 2021-07-27 NOTE — Patient Instructions (Addendum)

## 2021-07-27 NOTE — Progress Notes (Signed)
Safety precautions to be maintained throughout the outpatient stay will include: orient to surroundings, keep bed in low position, maintain call bell within reach at all times, provide assistance with transfer out of bed and ambulation.  

## 2021-07-28 ENCOUNTER — Telehealth: Payer: Self-pay

## 2021-07-28 NOTE — Telephone Encounter (Signed)
Post procedure phone call.  LM 

## 2021-08-09 NOTE — Progress Notes (Signed)
I see where you have sent me a note asking about the Vit B12 & Vit D levels. Unfortunately, these notes do not allow for me to respond. I have added chronic pain and lower extremity pain to the order association. Not sure if this is what you need or want. We order these tests on New patients as part of their Pain Management workup. Vit B12 tells [place] about possible reversible neuropathies while Vit D is associated with reversible causes if increased pain sensitivity and/or poor tolerance. Not sure what diagnostic code to use for these. These are subspecialty guided tests, outside of the "mainstream" reasons for ordering them. This is something that we encounter frequently in this specialty. The reasons why we order tests are often different from those normally used to justify things for coding. I would love to help you guys out, but entering an "approved" code for a test often end up being inaccurate. Any suggestions?

## 2021-08-09 NOTE — Addendum Note (Signed)
Encounter addended by: Delano Metz, MD on: 08/09/2021 12:09 PM  Actions taken: Clinical Note Signed

## 2021-08-12 ENCOUNTER — Ambulatory Visit: Payer: BC Managed Care – PPO | Admitting: Pain Medicine

## 2021-08-15 NOTE — Progress Notes (Unsigned)
PROVIDER NOTE: Information contained herein reflects review and annotations entered in association with encounter. Interpretation of such information and data should be left to medically-trained personnel. Information provided to patient can be located elsewhere in the medical record under "Patient Instructions". Document created using STT-dictation technology, any transcriptional errors that may result from process are unintentional.    Patient: Samantha Cox  Service Category: E/M  Provider: Gaspar Cola, MD  DOB: May 27, 1955  DOS: 08/19/2021  Specialty: Interventional Pain Management  MRN: [id number]  Setting: Ambulatory outpatient  PCP: Leonel Ramsay, MD  Type: Established Patient    Referring Provider: Leonel Ramsay, MD  Location: Office  Delivery: Face-to-face     HPI  [name], a 66 y.o. year old female, is here today because of her No primary diagnosis found.. [name] primary complain today is No chief complaint on file. Last encounter: My last encounter with her was on 07/27/2021. Pertinent problems: Ms. Holstine has Degenerative arthritis of hip; Greater trochanteric bursitis of hip (Left); Piriformis syndrome of left side; Morton's neuroma of foot (Left); Leg cramping; Degenerative lumbar spinal stenosis; Greater trochanteric bursitis of hip (Right); Facet arthritis of lumbar region; Chronic SI joint pain (Left); Hamstring tendinitis at origin; Myalgia due to statin; Cervical intraepithelial neoplasia grade 1; Chronic back pain; Degeneration of lumbar intervertebral disc; Enthesopathy of hip region (Right); Metatarsalgia of foot (Left); Pain in foot (Left); Plantar fasciitis of foot (Left); Chronic pain syndrome; Abnormal MRI, lumbar spine (07/28/2019); Cervicalgia (4th area of Pain); Chronic low back pain (1ry area of Pain) (Bilateral) w/o sciatica; Lumbar facet syndrome (Bilateral); Lumbosacral facet hypertrophy (Multilevel) (Bilateral); Chronic lower  extremity pain (3ry area of Pain) (nonradicular) (Bilateral); Cervicogenic headache (6th area of Pain); Chronic shoulder pain (5th area of Pain) (Bilateral); Chronic hip pain (2ry area of Pain) (Bilateral) (R>L); DDD (degenerative disc disease), lumbar; Osteoarthritis of acromioclavicular joints (Bilateral); Spondylosis of lumbar spine (Multilevel); Lumbar lateral recess stenosis (Left: L2-3, L3-4) (Right: L2-3, L4-5); Lumbar foraminal stenosis (Left: L2-3, L3-4, L4-5) (Right: L3-4, L4-5); Dextroscoliosis of lumbar spine (apex at L3-4); Grade 1 Anterolisthesis of lumbar spine (L4/L5) (5 mm); Osteoarthritis of hips (Bilateral) (L>R); Tendinopathy of gluteus medius (Right); Proximal hamstring tendon low-grade tear, sequela (Right); Superior labral tear (low-grade, partial-thickness) of hip joint, sequela (Right); Spondylosis without myelopathy or radiculopathy, lumbosacral region; Chronic musculoskeletal pain; and Other spondylosis, sacral and sacrococcygeal region on their pertinent problem list. Pain Assessment: Severity of   is reported as a  /10. Location:    / . Onset:  . Quality:  . Timing:  . Modifying factor(s):  Marland Kitchen Vitals:  vitals were not taken for this visit.   Reason for encounter: post-procedure evaluation and assessment. ***  Post-procedure evaluation    Procedure:          Anesthesia, Analgesia, Anxiolysis:  Procedure #1: Type: Medial Branch Facet Block #1 Primary Purpose: Diagnostic Region: Lumbar Level: L2, L3, L4, L5, & S1 Medial Branch Level(s) Target Area: For Lumbar Facet blocks, the target is the groove formed by the junction of the transverse process and superior articular process. For the L5 dorsal ramus, the target is the notch between superior articular process and sacral ala. For the S1 dorsal ramus, the target is the superior and lateral edge of the posterior S1 Sacral foramen. Approach: Posterior, paramedial, percutaneous approach. Laterality: Bilateral  Procedure  #2: Type: Sacroiliac Joint Block  #1  Primary Purpose: Diagnostic Region: Posterior Lumbosacral Level: PSIS (Posterior Superior Iliac Spine) Sacroiliac Joint  Target Area: For upper sacroiliac joint block(s), the target is the superior and posterior margin of the sacroiliac joint. Approach: Ipsilateral approach. Laterality: Left  Anesthesia: Local (1-2% Lidocaine)  Anxiolysis: IV  Sedation: Moderate  Guidance: Fluoroscopy           Position: Prone   1. Lumbar facet syndrome (Bilateral)   2. Spondylosis without myelopathy or radiculopathy, lumbosacral region   3. Lumbosacral facet hypertrophy (Multilevel) (Bilateral)   4. Grade 1 Anterolisthesis of lumbar spine (L4/L5) (5 mm)   5. DDD (degenerative disc disease), lumbar   6. Dextroscoliosis of lumbar spine (apex at L3-4)   7. Chronic SI joint pain (Left)   8. Other spondylosis, sacral and sacrococcygeal region   9. Chronic low back pain (1ry area of Pain) (Bilateral) w/o sciatica    NAS-11 Pain score:   Pre-procedure: 3 /10   Post-procedure: 0-No pain/10   NOTE: Non-Compliant with instructions to stay still and avoid moving. Patient in more than one occasion attempted to reach back into the sterile field. Constant twisting and movement of torso and legs. Procedure time and x-ray exposure significantly increased be non-compliance. Poor candidate for RFA under current conditions.     Effectiveness:  Initial hour after procedure:   ***. Subsequent 4-6 hours post-procedure:   ***. Analgesia past initial 6 hours:   ***. Ongoing improvement:  Analgesic:  *** Function:    ***    ROM:    ***     Pharmacotherapy Assessment  Analgesic: None MME/day: 0 mg/day   Monitoring: [place] PMP: PDMP reviewed during this encounter.       Pharmacotherapy: No side-effects or adverse reactions reported. Compliance: No problems identified. Effectiveness: Clinically acceptable.  No notes on file  UDS:  Summary  Date Value Ref Range Status   05/31/2021 Note  Final    Comment:    ==================================================================== Compliance Drug Analysis, Ur ==================================================================== Test                             Result       Flag       Units  Drug Present and Declared for Prescription Verification   Acetaminophen                  PRESENT      EXPECTED   Ibuprofen                      PRESENT      EXPECTED  Drug Present not Declared for Prescription Verification   Carboxy-THC                    997          UNEXPECTED ng/mg creat    Carboxy-THC is a metabolite of tetrahydrocannabinol (THC). Source of    THC is most commonly herbal marijuana or marijuana-based products,    but THC is also present in a scheduled prescription medication.    Trace amounts of THC can be present in hemp and cannabidiol (CBD)    products. This test is not intended to distinguish between delta-9-    tetrahydrocannabinol, the predominant form of THC in most herbal or    marijuana-based products, and delta-8-tetrahydrocannabinol.  Drug Absent but Declared for Prescription Verification   Methocarbamol                  Not Detected UNEXPECTED   Salicylate  Not Detected UNEXPECTED    Aspirin, as indicated in the declared medication list, is not always    detected even when used as directed.  ==================================================================== Test                      Result    Flag   Units      Ref Range   Creatinine              75               mg/dL      >=39 ==================================================================== Declared Medications:  The flagging and interpretation on this report are based on the  following declared medications.  Unexpected results may arise from  inaccuracies in the declared medications.   **Note: The testing scope of this panel includes these medications:   Methocarbamol (Robaxin)   **Note: The testing  scope of this panel does not include small to  moderate amounts of these reported medications:   Acetaminophen (Tylenol)  Aspirin  Ibuprofen (Advil)   **Note: The testing scope of this panel does not include the  following reported medications:   Calcium (Tums)  Multivitamin  Umeclidinium (Anoro)  Vilanterol (Anoro)  Vitamin D2 (Drisdol) ==================================================================== For clinical consultation, please call [phone]. ====================================================================      ROS  Constitutional: Denies any fever or chills Gastrointestinal: No reported hemesis, hematochezia, vomiting, or acute GI distress Musculoskeletal: Denies any acute onset joint swelling, redness, loss of ROM, or weakness Neurological: No reported episodes of acute onset apraxia, aphasia, dysarthria, agnosia, amnesia, paralysis, loss of coordination, or loss of consciousness  Medication Review  Multi-Vitamin, NON FORMULARY, Vitamin D (Ergocalciferol), acetaminophen, atorvastatin, ibuprofen, methocarbamol, and umeclidinium-vilanterol  History Review  Allergy: Ms. Raetz is allergic to crestor [rosuvastatin], simvastatin, and augmentin [amoxicillin-pot clavulanate]. Drug: Ms. Consolo  reports no history of drug use. Alcohol:  reports current alcohol use of about 1.0 standard drink of alcohol per week. Tobacco:  reports that she quit smoking about 8 years ago. Her smoking use included e-cigarettes and cigarettes. She has never used smokeless tobacco. Social: Ms. Glendenning  reports that she quit smoking about 8 years ago. Her smoking use included e-cigarettes and cigarettes. She has never used smokeless tobacco. She reports current alcohol use of about 1.0 standard drink of alcohol per week. She reports that she does not use drugs. Medical:  has a past medical history of History of tobacco use, Routine general medical examination at a health care  facility, and Screening for lipoid disorders. Surgical: Ms. Lisle  has a past surgical history that includes Other surgical history. Family: family history includes Asthma in her mother; Atrial fibrillation in her father; Bladder Cancer in her father; CVA (age of onset: 49) in her father; Diverticulitis in her brother; Hypertension in her mother; Lung cancer in her brother.  Laboratory Chemistry Profile   Renal Lab Results  Component Value Date   BUN 25 (H) 05/31/2021   CREATININE 0.68 05/31/2021   GFR 84.17 09/20/2016   GFRAA >60 01/16/2019   GFRNONAA >60 05/31/2021    Hepatic Lab Results  Component Value Date   AST 28 05/31/2021   ALT 29 05/31/2021   ALBUMIN 3.8 05/31/2021   ALKPHOS 79 05/31/2021    Electrolytes Lab Results  Component Value Date   NA 139 05/31/2021   K 4.2 05/31/2021   CL 107 05/31/2021   CALCIUM 9.4 05/31/2021   MG 2.3 05/31/2021    Bone Lab Results  Component Value Date   VD25OH 32.18 09/20/2016   25OHVITD1 69 05/31/2021   25OHVITD2 36 05/31/2021   25OHVITD3 33 05/31/2021    Inflammation (CRP: Acute Phase) (ESR: Chronic Phase) Lab Results  Component Value Date   CRP 0.7 05/31/2021   ESRSEDRATE 5 05/31/2021         Note: Above Lab results reviewed.  Recent Imaging Review  DG PAIN CLINIC C-ARM 1-60 MIN NO REPORT Fluoro was used, but no Radiologist interpretation will be provided.  Please refer to "NOTES" tab for provider progress note. Note: Reviewed        Physical Exam  General appearance: Well nourished, well developed, and well hydrated. In no apparent acute distress Mental status: Alert, oriented x 3 (person, place, & time)       Respiratory: No evidence of acute respiratory distress Eyes: PERLA Vitals: There were no vitals taken for this visit. BMI: Estimated body mass index is 27.2 kg/m as calculated from the following:   Height as of 07/27/21: 5' 5.5" (1.664 m).   Weight as of 07/27/21: 166 lb (75.3 kg). Ideal: Patient  weight not recorded  Assessment   Diagnosis Status  No diagnosis found. Controlled Controlled Controlled   Updated Problems: No problems updated.  Plan of Care  Problem-specific:  No problem-specific Assessment & Plan notes found for this encounter.  Ms. DAIJANAE RAFALSKI has a current medication list which includes the following long-term medication(s): atorvastatin and methocarbamol.  Pharmacotherapy (Medications Ordered): No orders of the defined types were placed in this encounter.  Orders:  No orders of the defined types were placed in this encounter.  Follow-up plan:   No follow-ups on file.     Interventional Therapies  Risk  Complexity Considerations:   Estimated body mass index is 30.36 kg/m as calculated from the following:   Height as of this encounter: $RemoveBeforeD'5\' 2"'[id number]$  (1.575 m).   Weight as of this encounter: 166 lb (75.3 kg). (05/31/2021) Abnormal UDS (+) Carboxy-THC ([place]IJ[place])  POOR RFA Candidate. Non-compliant w/ instructions not to move.    Planned  Pending:   Diagnostic bilateral lumbar facet MBB #1 + left SI joint Blk #1    Under consideration:   Diagnostic bilateral lumbar facet RFA. POOR RFA Candidate. Non-compliant w/ instructions not to move.    Completed:   Diagnostic bilateral lumbar facet MBB x1 (07/27/2021) (Non-compliant: Constant movement)  Diagnostic left SI joint Blk x1 (07/27/2021) (Non-compliant: Constant movement)    Completed by other providers:   ([facility])  [place] (Ultrasound-guided) right greater trochanteric inj. x1 by Dr. Gregor Hams (02/04/2020)  [place] right greater trochanteric bursa inj. x3 by Dr. Hulan Saas (11/25/2014; 07/10/2018; 10/19/2018)  [place] right SI joint inj. x1 by Dr. Gregor Hams (06/05/2019)  [place] left gluteus medius insertion greater trochanteric inj. x1 by Dr. Gregor Hams (01/29/2019)  [place] left piriformis tendon sheath inj. x3 by Dr. Hulan Saas (12/02/2016; 08/23/2017; 02/15/2018)  [place] left foot neuroma inj.  x1 (2 neuromas between 3rd & 4th, and 4th & 5th toes) by Dr. Hulan Saas (07/07/2016)  [place] right hip joint inj. x1 by Dr. Hulan Saas (12/04/2014)  Diagnostic bilateral L5 TFESI x1 (03/12/2021) by Girtha Hake, MD (PMR) [facility])  Diagnostic left L5 dorsal ramus and S1, S2, and S3 lateral branch Blk x1, by Charlett Blake, MD (09/10/2019)  Therapeutic left L5 dorsal ramus and S1, S2, and S3 lateral branch RFA x2, by Charlett Blake, MD (10/04/2019 & 04/10/2020) (1st provide her with  50% relief of the pain.  2nd made things worse)   Therapeutic  Palliative (PRN) options:   None established      Recent Visits Date Type Provider Dept  07/27/21 Procedure visit Milinda Pointer, MD [facility]  07/06/21 Office Visit Milinda Pointer, MD [facility]  05/31/21 Office Visit Milinda Pointer, MD [facility]  Showing recent visits within past 90 days and meeting all other requirements Future Appointments Date Type Provider Dept  08/19/21 Appointment Milinda Pointer, MD [facility]  Showing future appointments within next 90 days and meeting all other requirements  I discussed the assessment and treatment plan with the patient. The patient was provided an opportunity to ask questions and all were answered. The patient agreed with the plan and demonstrated an understanding of the instructions.  Patient advised to call back or seek an in-person evaluation if the symptoms or condition worsens.  Duration of encounter: *** minutes.  Total time on encounter, as per AMA guidelines included both the face-to-face and non-face-to-face time personally spent by the physician and/or other qualified health care professional(s) on the day of the encounter (includes time in activities that require the physician or other qualified health care professional and does not include time in activities normally performed by clinical staff). Physician's time may include  the following activities when performed: preparing to see the patient (eg, review of tests, pre-charting review of records) obtaining and/or reviewing separately obtained history performing a medically appropriate examination and/or evaluation counseling and educating the patient/family/caregiver ordering medications, tests, or procedures referring and communicating with other health care professionals (when not separately reported) documenting clinical information in the electronic or other health record independently interpreting results (not separately reported) and communicating results to the patient/ family/caregiver care coordination (not separately reported)  Note by: Gaspar Cola, MD Date: 08/19/2021; Time: 4:06 PM

## 2021-08-19 ENCOUNTER — Encounter: Payer: Self-pay | Admitting: Pain Medicine

## 2021-08-19 ENCOUNTER — Ambulatory Visit: Payer: BC Managed Care – PPO | Attending: Pain Medicine | Admitting: Pain Medicine

## 2021-08-19 VITALS — BP 124/68 | HR 76 | Temp 97.3°F | Ht 66.0 in | Wt 166.0 lb

## 2021-08-19 DIAGNOSIS — M25551 Pain in right hip: Secondary | ICD-10-CM | POA: Insufficient documentation

## 2021-08-19 DIAGNOSIS — G8929 Other chronic pain: Secondary | ICD-10-CM | POA: Insufficient documentation

## 2021-08-19 DIAGNOSIS — M47816 Spondylosis without myelopathy or radiculopathy, lumbar region: Secondary | ICD-10-CM | POA: Insufficient documentation

## 2021-08-19 DIAGNOSIS — M79604 Pain in right leg: Secondary | ICD-10-CM | POA: Diagnosis not present

## 2021-08-19 DIAGNOSIS — M79605 Pain in left leg: Secondary | ICD-10-CM | POA: Insufficient documentation

## 2021-08-19 DIAGNOSIS — M533 Sacrococcygeal disorders, not elsewhere classified: Secondary | ICD-10-CM | POA: Insufficient documentation

## 2021-08-19 DIAGNOSIS — M25552 Pain in left hip: Secondary | ICD-10-CM | POA: Diagnosis not present

## 2021-08-19 DIAGNOSIS — M545 Low back pain, unspecified: Secondary | ICD-10-CM | POA: Insufficient documentation

## 2021-08-19 NOTE — Progress Notes (Signed)
Safety precautions to be maintained throughout the outpatient stay will include: orient to surroundings, keep bed in low position, maintain call bell within reach at all times, provide assistance with transfer out of bed and ambulation.  

## 2021-09-14 ENCOUNTER — Ambulatory Visit: Payer: BC Managed Care – PPO | Admitting: Pain Medicine

## 2021-09-14 DIAGNOSIS — F4322 Adjustment disorder with anxiety: Secondary | ICD-10-CM | POA: Diagnosis not present

## 2021-09-19 NOTE — Progress Notes (Unsigned)
PROVIDER NOTE: Interpretation of information contained herein should be left to medically-trained personnel. Specific patient instructions are provided elsewhere under "Patient Instructions" section of medical record. This document was created in part using STT-dictation technology, any transcriptional errors that may result from this process are unintentional.  Patient: Samantha Cox Type: Established DOB: 1956/01/12 MRN: [id number] PCP: Mick Sell, MD  Service: Procedure DOS: 09/21/2021 Setting: Ambulatory Location: Ambulatory outpatient facility Delivery: Face-to-face Provider: Oswaldo Done, MD Specialty: Interventional Pain Management Specialty designation: 09 Location: Outpatient facility Ref. Prov.: Mick Sell, MD    Procedure:           Type: Lumbar Facet, Medial Branch Block(s) #2  Laterality: Bilateral  Level: L2, L3, L4, L5, & S1 Medial Branch Level(s). Injecting these levels blocks the L3-4, L4-5 and L5-S1 lumbar facet joints. Imaging: Fluoroscopic guidance         Anesthesia: Local anesthesia (1-2% Lidocaine) Anxiolysis: IV Versed 2.0 mg (she should be able to do procedures with only 1 mg). With 2 mg she tends to move a lot. Sedation:  None.  DOS: 09/21/2021 Performed by: Oswaldo Done, MD  Primary Purpose: Diagnostic/Therapeutic Indications: Low back pain severe enough to impact quality of life or function. 1. Lumbar facet syndrome (Bilateral)   2. Grade 1 Anterolisthesis of lumbar spine (L4/L5) (5 mm)   3. Lumbosacral facet hypertrophy (Multilevel) (Bilateral)   4. Spondylosis without myelopathy or radiculopathy, lumbosacral region   5. DDD (degenerative disc disease), lumbar   6. Chronic low back pain (1ry area of Pain) (Bilateral) w/o sciatica    NAS-11 Pain score:   Pre-procedure: 3 /10   Post-procedure: 0-No pain/10     Position / Prep / Materials:  Position: Prone  Prep solution: DuraPrep (Iodine Povacrylex [0.7% available  iodine] and Isopropyl Alcohol, 74% w/w) Area Prepped: Posterolateral Lumbosacral Spine (Wide prep: From the lower border of the scapula down to the end of the tailbone and from flank to flank.)  Materials:  Tray: Block Needle(s):  Type: Spinal  Gauge (G): 22  Length: 5-in Qty: 4     Pre-op H&P Assessment:  [name] is a 66 y.o. (year old), female patient, seen today for interventional treatment. She  has a past surgical history that includes Other surgical history. [name] has a current medication list which includes the following prescription(s): anoro ellipta, atorvastatin, ibuprofen, methocarbamol, multi-vitamin, NON FORMULARY, and vitamin d (ergocalciferol), and the following Facility-Administered Medications: fentanyl. Her primarily concern today is the Back Pain (low)  Initial Vital Signs:  Pulse/HCG Rate: 88ECG Heart Rate: 80 (NSR) Temp: (!) 96.9 F (36.1 C) Resp: 16 BP: 134/80 SpO2: 95 %  BMI: Estimated body mass index is 25.82 kg/m as calculated from the following:   Height as of this encounter: 5\' 6"  (1.676 m).   Weight as of this encounter: 160 lb (72.6 kg).  Risk Assessment: Allergies: Reviewed. She is allergic to crestor [rosuvastatin], simvastatin, and augmentin [amoxicillin-pot clavulanate].  Allergy Precautions: None required Coagulopathies: Reviewed. None identified.  Blood-thinner therapy: None at this time Active Infection(s): Reviewed. None identified. [name] is afebrile  Site Confirmation: [name] was asked to confirm the procedure and laterality before marking the site Procedure checklist: Completed Consent: Before the procedure and under the influence of no sedative(s), amnesic(s), or anxiolytics, the patient was informed of the treatment options, risks and possible complications. To fulfill our ethical and legal obligations, as recommended by the American Medical Association's Code of Ethics, I have informed the  patient of my  clinical impression; the nature and purpose of the treatment or procedure; the risks, benefits, and possible complications of the intervention; the alternatives, including doing nothing; the risk(s) and benefit(s) of the alternative treatment(s) or procedure(s); and the risk(s) and benefit(s) of doing nothing. The patient was provided information about the general risks and possible complications associated with the procedure. These may include, but are not limited to: failure to achieve desired goals, infection, bleeding, organ or nerve damage, allergic reactions, paralysis, and death. In addition, the patient was informed of those risks and complications associated to Spine-related procedures, such as failure to decrease pain; infection (i.e.: Meningitis, epidural or intraspinal abscess); bleeding (i.e.: epidural hematoma, subarachnoid hemorrhage, or any other type of intraspinal or peri-dural bleeding); organ or nerve damage (i.e.: Any type of peripheral nerve, nerve root, or spinal cord injury) with subsequent damage to sensory, motor, and/or autonomic systems, resulting in permanent pain, numbness, and/or weakness of one or several areas of the body; allergic reactions; (i.e.: anaphylactic reaction); and/or death. Furthermore, the patient was informed of those risks and complications associated with the medications. These include, but are not limited to: allergic reactions (i.e.: anaphylactic or anaphylactoid reaction(s)); adrenal axis suppression; blood sugar elevation that in diabetics may result in ketoacidosis or comma; water retention that in patients with history of congestive heart failure may result in shortness of breath, pulmonary edema, and decompensation with resultant heart failure; weight gain; swelling or edema; medication-induced neural toxicity; particulate matter embolism and blood vessel occlusion with resultant organ, and/or nervous system infarction; and/or aseptic necrosis of one or  more joints. Finally, the patient was informed that Medicine is not an exact science; therefore, there is also the possibility of unforeseen or unpredictable risks and/or possible complications that may result in a catastrophic outcome. The patient indicated having understood very clearly. We have given the patient no guarantees and we have made no promises. Enough time was given to the patient to ask questions, all of which were answered to the patient's satisfaction. Ms. Hemphill has indicated that she wanted to continue with the procedure. Attestation: I, the ordering provider, attest that I have discussed with the patient the benefits, risks, side-effects, alternatives, likelihood of achieving goals, and potential problems during recovery for the procedure that I have provided informed consent. Date  Time: 09/21/2021  8:25 AM  Pre-Procedure Preparation:  Monitoring: As per clinic protocol. Respiration, ETCO2, SpO2, BP, heart rate and rhythm monitor placed and checked for adequate function Safety Precautions: Patient was assessed for positional comfort and pressure points before starting the procedure. Time-out: I initiated and conducted the "Time-out" before starting the procedure, as per protocol. The patient was asked to participate by confirming the accuracy of the "Time Out" information. Verification of the correct person, site, and procedure were performed and confirmed by me, the nursing staff, and the patient. "Time-out" conducted as per Joint Commission's Universal Protocol (UP.01.01.01). Time: [phone]  Description of Procedure:          Laterality: Bilateral. The procedure was performed in identical fashion on both sides. Targeted Levels:  L2, L3, L4, L5, & S1 Medial Branch Level(s)  Safety Precautions: Aspiration looking for blood return was conducted prior to all injections. At no point did we inject any substances, as a needle was being advanced. Before injecting, the patient was told to  immediately notify me if she was experiencing any new onset of "ringing in the ears, or metallic taste in the mouth". No attempts were made  at seeking any paresthesias. Safe injection practices and needle disposal techniques used. Medications properly checked for expiration dates. SDV (single dose vial) medications used. After the completion of the procedure, all disposable equipment used was discarded in the proper designated medical waste containers. Local Anesthesia: Protocol guidelines were followed. The patient was positioned over the fluoroscopy table. The area was prepped in the usual manner. The time-out was completed. The target area was identified using fluoroscopy. A 12-in long, straight, sterile hemostat was used with fluoroscopic guidance to locate the targets for each level blocked. Once located, the skin was marked with an approved surgical skin marker. Once all sites were marked, the skin (epidermis, dermis, and hypodermis), as well as deeper tissues (fat, connective tissue and muscle) were infiltrated with a small amount of a short-acting local anesthetic, loaded on a 10cc syringe with a 25G, 1.5-in  Needle. An appropriate amount of time was allowed for local anesthetics to take effect before proceeding to the next step. Local Anesthetic: Lidocaine 2.0% The unused portion of the local anesthetic was discarded in the proper designated containers. Technical description of process:  L2 Medial Branch Nerve Block (MBB): The target area for the L2 medial branch is at the junction of the postero-lateral aspect of the superior articular process and the superior, posterior, and medial edge of the transverse process of L3. Under fluoroscopic guidance, a Quincke needle was inserted until contact was made with os over the superior postero-lateral aspect of the pedicular shadow (target area). After negative aspiration for blood, 0.5 mL of the nerve block solution was injected without difficulty or  complication. The needle was removed intact. L3 Medial Branch Nerve Block (MBB): The target area for the L3 medial branch is at the junction of the postero-lateral aspect of the superior articular process and the superior, posterior, and medial edge of the transverse process of L4. Under fluoroscopic guidance, a Quincke needle was inserted until contact was made with os over the superior postero-lateral aspect of the pedicular shadow (target area). After negative aspiration for blood, 0.5 mL of the nerve block solution was injected without difficulty or complication. The needle was removed intact. L4 Medial Branch Nerve Block (MBB): The target area for the L4 medial branch is at the junction of the postero-lateral aspect of the superior articular process and the superior, posterior, and medial edge of the transverse process of L5. Under fluoroscopic guidance, a Quincke needle was inserted until contact was made with os over the superior postero-lateral aspect of the pedicular shadow (target area). After negative aspiration for blood, 0.5 mL of the nerve block solution was injected without difficulty or complication. The needle was removed intact. L5 Medial Branch Nerve Block (MBB): The target area for the L5 medial branch is at the junction of the postero-lateral aspect of the superior articular process and the superior, posterior, and medial edge of the sacral ala. Under fluoroscopic guidance, a Quincke needle was inserted until contact was made with os over the superior postero-lateral aspect of the pedicular shadow (target area). After negative aspiration for blood, 0.5 mL of the nerve block solution was injected without difficulty or complication. The needle was removed intact. S1 Medial Branch Nerve Block (MBB): The target area for the S1 medial branch is at the posterior and inferior 6 o'clock position of the L5-S1 facet joint. Under fluoroscopic guidance, the Quincke needle inserted for the L5 MBB was  redirected until contact was made with os over the inferior and postero aspect of the sacrum,  at the 6 o' clock position under the L5-S1 facet joint (Target area). After negative aspiration for blood, 0.5 mL of the nerve block solution was injected without difficulty or complication. The needle was removed intact.  Once the entire procedure was completed, the treated area was cleaned, making sure to leave some of the prepping solution back to take advantage of its long term bactericidal properties.  Note: She had difficulty staying still during procedure, which she claimed she could not control. This will make it very difficult to have a successful RFA.         Illustration of the posterior view of the lumbar spine and the posterior neural structures. Laminae of L2 through S1 are labeled. DPRL5, dorsal primary ramus of L5; DPRS1, dorsal primary ramus of S1; DPR3, dorsal primary ramus of L3; FJ, facet (zygapophyseal) joint L3-L4; I, inferior articular process of L4; LB1, lateral branch of dorsal primary ramus of L1; IAB, inferior articular branches from L3 medial branch (supplies L4-L5 facet joint); IBP, intermediate branch plexus; MB3, medial branch of dorsal primary ramus of L3; NR3, third lumbar nerve root; S, superior articular process of L5; SAB, superior articular branches from L4 (supplies L4-5 facet joint also); TP3, transverse process of L3.  Vitals:   09/21/21 1002 09/21/21 1017 09/21/21 1021 09/21/21 1027  BP: (!) 124/90 119/77 114/73 123/77  Pulse:      Resp: 18 16 14 14   Temp:      SpO2: 97% 92% 92% 94%  Weight:      Height:         Start Time: 0947 hrs. End Time: 1002 hrs.  Imaging Guidance (Spinal):          Type of Imaging Technique: Fluoroscopy Guidance (Spinal) Indication(s): Assistance in needle guidance and placement for procedures requiring needle placement in or near specific anatomical locations not easily accessible without such assistance. Exposure Time: Please  see nurses notes. Contrast: None used. Fluoroscopic Guidance: I was personally present during the use of fluoroscopy. "Tunnel Vision Technique" used to obtain the best possible view of the target area. Parallax error corrected before commencing the procedure. "Direction-depth-direction" technique used to introduce the needle under continuous pulsed fluoroscopy. Once target was reached, antero-posterior, oblique, and lateral fluoroscopic projection used confirm needle placement in all planes. Images permanently stored in EMR. Interpretation: No contrast injected. I personally interpreted the imaging intraoperatively. Adequate needle placement confirmed in multiple planes. Permanent images saved into the patient's record.  Antibiotic Prophylaxis:   Anti-infectives (From admission, onward)    None      Indication(s): None identified  Post-operative Assessment:  Post-procedure Vital Signs:  Pulse/HCG Rate: 8876 Temp: (!) 96.9 F (36.1 C) Resp: 14 BP: 123/77 SpO2: 94 %  EBL: None  Complications: No immediate post-treatment complications observed by team, or reported by patient.  Note: The patient tolerated the entire procedure well. A repeat set of vitals were taken after the procedure and the patient was kept under observation following institutional policy, for this type of procedure. Post-procedural neurological assessment was performed, showing return to baseline, prior to discharge. The patient was provided with post-procedure discharge instructions, including a section on how to identify potential problems. Should any problems arise concerning this procedure, the patient was given instructions to immediately contact us, at any time, without hesitation. In any case, we plan to contact the patient by telephone for a follow-up status report regarding this interventional procedure.  Comments:   The patient did experience some post-op nausea, treated  w/ ondansetron (Zofran) 4 mg IV.  Plan  of Care  Orders:  Orders Placed This Encounter  Procedures   LUMBAR FACET(MEDIAL BRANCH NERVE BLOCK) MBNB    Scheduling Instructions:     Procedure: Lumbar facet block (AKA.: Lumbosacral medial branch nerve block)     Side: Bilateral     Level: L3-4 & L5-S1 Facets (L2, L3, L4, L5, & S1 Medial Branch Nerves)     Sedation: Patient's choice.     Timeframe: Today    Order Specific Question:   Where will this procedure be performed?    Answer:   [facility]   DG PAIN [facility] C-ARM 1-60 MIN NO REPORT    Intraoperative interpretation by procedural physician at [place].    Standing Status:   Standing    Number of Occurrences:   1    Order Specific Question:   Reason for exam:    Answer:   Assistance in needle guidance and placement for procedures requiring needle placement in or near specific anatomical locations not easily accessible without such assistance.   Informed Consent Details: Physician/Practitioner Attestation; Transcribe to consent form and obtain patient signature    Nursing Order: Transcribe to consent form and obtain patient signature. Note: Always confirm laterality of pain with Ms. Palo, before procedure.    Order Specific Question:   Physician/Practitioner attestation of informed consent for procedure/surgical case    Answer:   I, the physician/practitioner, attest that I have discussed with the patient the benefits, risks, side effects, alternatives, likelihood of achieving goals and potential problems during recovery for the procedure that I have provided informed consent.    Order Specific Question:   Procedure    Answer:   Lumbar Facet Block  under fluoroscopic guidance    Order Specific Question:   Physician/Practitioner performing the procedure    Answer:   Xavier Fournier A. Dossie Arbour MD    Order Specific Question:   Indication/Reason    Answer:   Low Back Pain, with our without leg pain, due to Facet Joint Arthralgia (Joint Pain) Spondylosis  (Arthritis of the Spine), without myelopathy or radiculopathy (Nerve Damage).   Provide equipment / supplies at bedside    "Block Tray" (Disposable  single use) Needle type: SpinalSpinal Amount/quantity: 4 Size: Medium (5-inch) Gauge: 22G    Standing Status:   Standing    Number of Occurrences:   1    Order Specific Question:   Specify    Answer:   Block Tray   Chronic Opioid Analgesic:  None MME/day: 0 mg/day   Medications ordered for procedure: Meds ordered this encounter  Medications   lidocaine (XYLOCAINE) 2 % (with pres) injection 400 mg   lactated ringers infusion   midazolam (VERSED) 5 MG/5ML injection 0.5-2 mg    Make sure Flumazenil is available in the pyxis when using this medication. If oversedation occurs, administer 0.2 mg IV over 15 sec. If after 45 sec no response, administer 0.2 mg again over 1 min; may repeat at 1 min intervals; not to exceed 4 doses (1 mg)   fentaNYL (SUBLIMAZE) injection 25-50 mcg    Make sure Narcan is available in the pyxis when using this medication. In the event of respiratory depression (RR< 8/min): Titrate NARCAN (naloxone) in increments of 0.1 to 0.2 mg IV at 2-3 minute intervals, until desired degree of reversal.   ropivacaine (PF) 2 mg/mL (0.2%) (NAROPIN) injection 18 mL   triamcinolone acetonide (KENALOG-40) injection 80 mg   ondansetron (ZOFRAN)  injection 4 mg   Medications administered: We administered lidocaine, lactated ringers, midazolam, ropivacaine (PF) 2 mg/mL (0.2%), triamcinolone acetonide, and ondansetron.  See the medical record for exact dosing, route, and time of administration.  Follow-up plan:   Return in about 2 weeks (around 10/05/2021) for Proc-day (T,Th), (VV), (PPE).       Interventional Therapies  Risk  Complexity Considerations:   Estimated body mass index is 30.36 kg/m as calculated from the following:   Height as of this encounter: 5\' 2"  (1.575 m).   Weight as of this encounter: 166 lb (75.3  kg). (05/31/2021) Abnormal UDS (+) Carboxy-THC (MARIJUANA)  POOR RFA Candidate. Non-compliant w/ instructions not to move.    Planned  Pending:   Diagnostic bilateral lumbar facet MBB #1 + left SI joint Blk #1    Under consideration:   Diagnostic bilateral lumbar facet RFA. POOR RFA Candidate. Non-compliant w/ instructions not to move.    Completed:  (Non-compliant: Constant movement)  Diagnostic bilateral lumbar facet MBB x1 (07/27/2021) (100/100/90/90) (100% LEP relief)  Diagnostic left SI joint Blk x1 (07/27/2021) (100/100/90/90) (100% LEP relief)    Completed by other providers:   ([place] [facility])  [place] (Ultrasound-guided) right greater trochanteric inj. x1 by Dr. Rodolph Bong (02/04/2020)  [place] right greater trochanteric bursa inj. x3 by Dr. [name] (11/25/2014; 07/10/2018; 10/19/2018)  [place] right SI joint inj. x1 by Dr. Rodolph Bong (06/05/2019)  [place] left gluteus medius insertion greater trochanteric inj. x1 by Dr. Rodolph Bong (01/29/2019)  [place] left piriformis tendon sheath inj. x3 by Dr. [name] (12/02/2016; 08/23/2017; 02/15/2018)  [place] left foot neuroma inj. x1 (2 neuromas between 3rd & 4th, and 4th & 5th toes) by Dr. [name] (07/07/2016)  [place] right hip joint inj. x1 by Dr. [name] (12/04/2014)  Diagnostic bilateral L5 TFESI x1 (03/12/2021) by Filomena Jungling, MD (PMR) [facility])  Diagnostic left L5 dorsal ramus and S1, S2, and S3 lateral branch Blk x1, by Erick Colace, MD (09/10/2019)  Therapeutic left L5 dorsal ramus and S1, S2, and S3 lateral branch RFA x2, by Erick Colace, MD (10/04/2019 & 04/10/2020) (1st provide her with 50% relief of the pain.  2nd made things worse)   Therapeutic  Palliative (PRN) options:   None established       Recent Visits Date Type Provider Dept  08/19/21 Office Visit Delano Metz, MD [facility]  07/27/21 Procedure visit Delano Metz, MD [facility]  07/06/21 Office Visit Delano Metz, MD [facility]  Showing recent visits within past 90 days and meeting all other requirements Today's Visits Date Type Provider Dept  09/21/21 Procedure visit Delano Metz, MD [facility]  Showing today's visits and meeting all other requirements Future Appointments Date Type Provider Dept  10/05/21 Appointment Delano Metz, MD [facility]  Showing future appointments within next 90 days and meeting all other requirements  Disposition: Discharge home  Discharge (Date  Time): 09/21/2021; 1030 hrs.   Primary Care Physician: Mick Sell, MD Location: [facility] Note by: Oswaldo Done, MD Date: 09/21/2021; Time: 12:11 PM  Disclaimer:  Medicine is not an Visual merchandiser. The only guarantee in medicine is that nothing is guaranteed. It is important to note that the decision to proceed with this intervention was based on the information collected from the patient. The Data and conclusions were drawn from the patient's questionnaire, the interview, and the physical examination. Because the information was  provided in large part by the patient, it cannot be guaranteed that it has not been purposely or unconsciously manipulated. Every effort has been made to obtain as much relevant data as possible for this evaluation. It is important to note that the conclusions that lead to this procedure are derived in large part from the available data. Always take into account that the treatment will also be dependent on availability of resources and existing treatment guidelines, considered by other Pain Management Practitioners as being common knowledge and practice, at the time of the intervention. For Medico-Legal purposes, it is also important to point out that variation in procedural techniques and pharmacological choices are the acceptable norm. The indications, contraindications, technique, and results of the above  procedure should only be interpreted and judged by a Board-Certified Interventional Pain Specialist with extensive familiarity and expertise in the same exact procedure and technique.

## 2021-09-21 ENCOUNTER — Ambulatory Visit: Payer: BC Managed Care – PPO | Attending: Pain Medicine | Admitting: Pain Medicine

## 2021-09-21 ENCOUNTER — Encounter: Payer: Self-pay | Admitting: Pain Medicine

## 2021-09-21 ENCOUNTER — Ambulatory Visit
Admission: RE | Admit: 2021-09-21 | Discharge: 2021-09-21 | Disposition: A | Discharge status: Home / Self Care | Payer: BC Managed Care – PPO | Source: Ambulatory Visit | Attending: Pain Medicine | Admitting: Pain Medicine

## 2021-09-21 VITALS — BP 123/77 | HR 88 | Temp 96.9°F | Resp 14 | Ht 66.0 in | Wt 160.0 lb

## 2021-09-21 DIAGNOSIS — M545 Low back pain, unspecified: Secondary | ICD-10-CM | POA: Diagnosis not present

## 2021-09-21 DIAGNOSIS — Z9889 Other specified postprocedural states: Secondary | ICD-10-CM | POA: Diagnosis not present

## 2021-09-21 DIAGNOSIS — M4316 Spondylolisthesis, lumbar region: Secondary | ICD-10-CM | POA: Insufficient documentation

## 2021-09-21 DIAGNOSIS — M47817 Spondylosis without myelopathy or radiculopathy, lumbosacral region: Secondary | ICD-10-CM | POA: Insufficient documentation

## 2021-09-21 DIAGNOSIS — R110 Nausea: Secondary | ICD-10-CM | POA: Insufficient documentation

## 2021-09-21 DIAGNOSIS — G8929 Other chronic pain: Secondary | ICD-10-CM | POA: Insufficient documentation

## 2021-09-21 DIAGNOSIS — M5136 Other intervertebral disc degeneration, lumbar region: Secondary | ICD-10-CM | POA: Diagnosis not present

## 2021-09-21 DIAGNOSIS — M47816 Spondylosis without myelopathy or radiculopathy, lumbar region: Secondary | ICD-10-CM

## 2021-09-21 MED ORDER — ONDANSETRON HCL 4 MG/2ML IJ SOLN
INTRAMUSCULAR | Status: AC
  Filled 2021-09-21: qty 2

## 2021-09-21 MED ORDER — LIDOCAINE HCL 2 % IJ SOLN
INTRAMUSCULAR | Status: AC
  Filled 2021-09-21: qty 20

## 2021-09-21 MED ORDER — TRIAMCINOLONE ACETONIDE 40 MG/ML IJ SUSP
INTRAMUSCULAR | Status: AC
  Filled 2021-09-21: qty 2

## 2021-09-21 MED ORDER — FENTANYL CITRATE (PF) 100 MCG/2ML IJ SOLN
INTRAMUSCULAR | Status: AC
  Filled 2021-09-21: qty 2

## 2021-09-21 MED ORDER — LACTATED RINGERS IV SOLN: Freq: Once | INTRAVENOUS | Status: AC

## 2021-09-21 MED ORDER — LIDOCAINE HCL 2 % IJ SOLN
20.0000 mL | Freq: Once | INTRAMUSCULAR | Status: AC
  Administered 2021-09-21: 400 mg

## 2021-09-21 MED ORDER — MIDAZOLAM HCL 5 MG/5ML IJ SOLN
0.5000 mg | Freq: Once | INTRAMUSCULAR | Status: AC
  Administered 2021-09-21: 2 mg via INTRAVENOUS

## 2021-09-21 MED ORDER — ROPIVACAINE HCL 2 MG/ML IJ SOLN
18.0000 mL | Freq: Once | INTRAMUSCULAR | Status: AC
  Administered 2021-09-21: 18 mL via PERINEURAL

## 2021-09-21 MED ORDER — ROPIVACAINE HCL 2 MG/ML IJ SOLN
INTRAMUSCULAR | Status: AC
  Filled 2021-09-21: qty 20

## 2021-09-21 MED ORDER — FENTANYL CITRATE (PF) 100 MCG/2ML IJ SOLN: 25.0000 ug | INTRAMUSCULAR | Status: DC | PRN

## 2021-09-21 MED ORDER — TRIAMCINOLONE ACETONIDE 40 MG/ML IJ SUSP
80.0000 mg | Freq: Once | INTRAMUSCULAR | Status: AC
  Administered 2021-09-21: 80 mg

## 2021-09-21 MED ORDER — ONDANSETRON HCL 4 MG/2ML IJ SOLN
4.0000 mg | Freq: Once | INTRAMUSCULAR | Status: AC
  Administered 2021-09-21: 4 mg via INTRAVENOUS

## 2021-09-21 MED ORDER — MIDAZOLAM HCL 5 MG/5ML IJ SOLN
INTRAMUSCULAR | Status: AC
  Filled 2021-09-21: qty 5

## 2021-09-21 NOTE — Progress Notes (Signed)
Safety precautions to be maintained throughout the outpatient stay will include: orient to surroundings, keep bed in low position, maintain call bell within reach at all times, provide assistance with transfer out of bed and ambulation.  

## 2021-09-21 NOTE — Progress Notes (Signed)
1010 Complaining of nausea upon arrival to Recovery Room Dr.Naveira notified. Zofran givne. 1020 States nausea better.

## 2021-09-21 NOTE — Patient Instructions (Signed)
____________________________________________________________________________________________  Virtual Visits   What is a "Virtual Visit"? It is a healthcare communication encounter (medical visit) that takes place on real time (NOT TEXT or E-MAIL) over the telephone or computer device (desktop, laptop, tablet, smart phone, etc.). It allows for more location flexibility between the patient and the healthcare provider.  Who decides when these types of visits will be used? The physician.  Who is eligible for these types of visits? Only those patients that can be reliably reached over the telephone.  What do you mean by reliably? We do not have time to call everyone multiple times, therefore those that tend to screen calls and then call back later are not suitable candidates for this system. We understand how people are reluctant to pickup on "unknown" calls, therefore, we suggest adding our telephone numbers to your list of "CONTACT(s)". This way, you should be able to readily identify our calls when you receive one. All of our numbers are available below.   Who is not eligible? This option is not available for medication management encounters, specially for controlled substances. Patients on pain medications that fall under the category of controlled substances have to come in for "Face-to-Face" encounters. This is required for mandatory monitoring of these substances. You may be asked to provide a sample for an unannounced urine drug screening test (UDS), and we will need to count your pain pills. Not bringing your pills to be counted may result in no refill. Obviously, neither one of these can be done over the phone.  When will this type of visits be used? You can request a virtual visit whenever you are physically unable to attend a regular appointment. The decision will be made by the physician (or healthcare provider) on a case by case basis.   At what time will I be called? This is an  excellent question. The providers will try to call you whenever they have time available. Do not expect to be called at any specific time. The secretaries will assign you a time for your virtual visit appointment, but this is done simply to keep a list of those patients that need to be called, but not for the purpose of keeping a time schedule. Be advised that the call may come in anytime during the day, between the hours of 8:00 AM and 8::00 PM, depending on provider availability. We do understand that the system is not perfect. If you are unable to be available that day on a moments notice, then request an "in-person" appointment rather than a "virtual visit".  Can I request my medication visits to be "Virtual"? Yes you may request it, but the decision is entirely up to the healthcare provider. Control substances require specific monitoring that requires Face-to-Face encounters. The number of encounters  and the extent of the monitoring is determined on a case by case basis.  Add a new contact to your smart phone and label it "PAIN CLINIC" Under this contact add the following numbers: Main: [phone] (Official Contact Number) Nurses: [phone] (These are outgoing only calling systems. Do not call this number.) Dr. Dominico Rod: [phone] or [phone] (Outgoing calls only. Do not call this number.)  ____________________________________________________________________________________________    ____________________________________________________________________________________________  Post-Procedure Discharge Instructions  Instructions: Apply ice:  Purpose: This will minimize any swelling and discomfort after procedure.  When: Day of procedure, as soon as you get home. How: Fill a plastic sandwich bag with crushed ice. Cover it with a small towel and apply   to injection site. How long: (15 min on, 15 min off) Apply for 15 minutes then remove x 15 minutes.  Repeat sequence on  day of procedure, until you go to bed. Apply heat:  Purpose: To treat any soreness and discomfort from the procedure. When: Starting the next day after the procedure. How: Apply heat to procedure site starting the day following the procedure. How long: May continue to repeat daily, until discomfort goes away. Food intake: Start with clear liquids (like water) and advance to regular food, as tolerated.  Physical activities: Keep activities to a minimum for the first 8 hours after the procedure. After that, then as tolerated. Driving: If you have received any sedation, be responsible and do not drive. You are not allowed to drive for 24 hours after having sedation. Blood thinner: (Applies only to those taking blood thinners) You may restart your blood thinner 6 hours after your procedure. Insulin: (Applies only to Diabetic patients taking insulin) As soon as you can eat, you may resume your normal dosing schedule. Infection prevention: Keep procedure site clean and dry. Shower daily and clean area with soap and water. Post-procedure Pain Diary: Extremely important that this be done correctly and accurately. Recorded information will be used to determine the next step in treatment. For the purpose of accuracy, follow these rules: Evaluate only the area treated. Do not report or include pain from an untreated area. For the purpose of this evaluation, ignore all other areas of pain, except for the treated area. After your procedure, avoid taking a long nap and attempting to complete the pain diary after you wake up. Instead, set your alarm clock to go off every hour, on the hour, for the initial 8 hours after the procedure. Document the duration of the numbing medicine, and the relief you are getting from it. Do not go to sleep and attempt to complete it later. It will not be accurate. If you received sedation, it is likely that you were given a medication that may cause amnesia. Because of this,  completing the diary at a later time may cause the information to be inaccurate. This information is needed to plan your care. Follow-up appointment: Keep your post-procedure follow-up evaluation appointment after the procedure (usually 2 weeks for most procedures, 6 weeks for radiofrequencies). DO NOT FORGET to bring you pain diary with you.   Expect: (What should I expect to see with my procedure?) From numbing medicine (AKA: Local Anesthetics): Numbness or decrease in pain. You may also experience some weakness, which if present, could last for the duration of the local anesthetic. Onset: Full effect within 15 minutes of injected. Duration: It will depend on the type of local anesthetic used. On the average, 1 to 8 hours.  From steroids (Applies only if steroids were used): Decrease in swelling or inflammation. Once inflammation is improved, relief of the pain will follow. Onset of benefits: Depends on the amount of swelling present. The more swelling, the longer it will take for the benefits to be seen. In some cases, up to 10 days. Duration: Steroids will stay in the system x 2 weeks. Duration of benefits will depend on multiple posibilities including persistent irritating factors. Side-effects: If present, they may typically last 2 weeks (the duration of the steroids). Frequent: Cramps (if they occur, drink Gatorade and take over-the-counter Magnesium 450-500 mg once to twice a day); water retention with temporary weight gain; increases in blood sugar; decreased immune system response; increased appetite. Occasional: Facial flushing (  red, warm cheeks); mood swings; menstrual changes. Uncommon: Long-term decrease or suppression of natural hormones; bone thinning. (These are more common with higher doses or more frequent use. This is why we prefer that our patients avoid having any injection therapies in other practices.)  Very Rare: Severe mood changes; psychosis; aseptic necrosis. From  procedure: Some discomfort is to be expected once the numbing medicine wears off. This should be minimal if ice and heat are applied as instructed.  Call if: (When should I call?) You experience numbness and weakness that gets worse with time, as opposed to wearing off. New onset bowel or bladder incontinence. (Applies only to procedures done in the spine)  Emergency Numbers: Durning business hours (Monday - Thursday, 8:00 AM - 4:00 PM) (Friday, 9:00 AM - 12:00 Noon): [phone] After hours: [phone] NOTE: If you are having a problem and are unable connect with, or to talk to a provider, then go to your nearest urgent care or emergency department. If the problem is serious and urgent, please call 911. ____________________________________________________________________________________________    

## 2021-09-22 ENCOUNTER — Telehealth: Payer: Self-pay | Admitting: *Deleted

## 2021-09-22 NOTE — Telephone Encounter (Signed)
Post procedure call;  patient reports she is doing well.  ?

## 2021-09-23 DIAGNOSIS — F4322 Adjustment disorder with anxiety: Secondary | ICD-10-CM | POA: Diagnosis not present

## 2021-09-30 DIAGNOSIS — F4323 Adjustment disorder with mixed anxiety and depressed mood: Secondary | ICD-10-CM | POA: Diagnosis not present

## 2021-10-03 NOTE — Progress Notes (Unsigned)
Patient: Samantha Cox  Service Category: E/M  Provider: Gaspar Cola, MD  DOB: 1955/06/29  DOS: 10/05/2021  Location: Office  MRN: [id number]  Setting: Ambulatory outpatient  Referring Provider: Leonel Ramsay, MD  Type: Established Patient  Specialty: Interventional Pain Management  PCP: Leonel Ramsay, MD  Location: Remote location  Delivery: TeleHealth     Virtual Encounter - Pain Management PROVIDER NOTE: Information contained herein reflects review and annotations entered in association with encounter. Interpretation of such information and data should be left to medically-trained personnel. Information provided to patient can be located elsewhere in the medical record under "Patient Instructions". Document created using STT-dictation technology, any transcriptional errors that may result from process are unintentional.    Contact & Pharmacy Preferred: [phone] Home: [phone] (home) Mobile: [phone] (mobile) E-mail: [email or web address]  [place], [place] [address] Phone: [phone] Fax: [phone]  [facility], [place] [address] Phone: [phone] Fax: [phone]  [place], [place] - [address] [place] [address] Phone: [phone] Fax: [phone]   Pre-screening  Samantha Cox offered "in-person" vs "virtual" encounter. She indicated preferring virtual for this encounter.   Reason COVID-19*  Social distancing based on CDC and AMA recommendations.   I contacted Samantha Cox on 10/05/2021 via telephone.      I clearly identified myself as Gaspar Cola, MD. I verified that I was speaking with the correct person using two identifiers (Name: Samantha Cox, and date of birth:  1956-01-03).  Consent I sought verbal advanced consent from Samantha Cox for virtual visit interactions. I informed Samantha Cox of possible security and privacy concerns, risks, and limitations associated with providing "not-in-person" medical evaluation and management services. I also informed Samantha Cox of the availability of "in-person" appointments. Finally, I informed her that there would be a charge for the virtual visit and that she could be  personally, fully or partially, financially responsible for it. Samantha Cox expressed understanding and agreed to proceed.   Historic Elements   Samantha Cox is a 66 y.o. year old, female patient evaluated today after our last contact on 09/21/2021. Samantha Cox  has a past medical history of History of tobacco use, Routine general medical examination at a health care facility, and Screening for lipoid disorders. She also  has a past surgical history that includes Other surgical history. Samantha Cox has a current medication list which includes the following prescription(s): anoro ellipta, ibuprofen, multi-vitamin, NON FORMULARY, vitamin d (ergocalciferol), atorvastatin, and methocarbamol. She  reports that she quit smoking about 8 years ago. Her smoking use included e-cigarettes and cigarettes. She has never used smokeless tobacco. She reports current alcohol use of about 1.0 standard drink of alcohol per week. She reports that she does not use drugs. Samantha Cox is allergic to crestor [rosuvastatin], simvastatin, and augmentin [amoxicillin-pot clavulanate].   HPI  Today, she is being contacted for a post-procedure assessment.  Post-procedure evaluation   Type: Lumbar Facet, Medial Branch Block(s) #2  Laterality: Bilateral  Level: L2, L3, L4, L5, & S1 Medial Branch Level(s). Injecting these levels blocks the L3-4, L4-5 and L5-S1 lumbar facet joints. Imaging: Fluoroscopic guidance         Anesthesia: Local anesthesia (1-2%  Lidocaine) Anxiolysis: IV Versed  2.0 mg (she should be able to do procedures with only 1 mg). With 2 mg she tends to move a lot. Sedation:  None.  DOS: 09/21/2021 Performed by: Gaspar Cola, MD  Primary Purpose: Diagnostic/Therapeutic Indications: Low back pain severe enough to impact quality of life or function. 1. Lumbar facet syndrome (Bilateral)   2. Grade 1 Anterolisthesis of lumbar spine (L4/L5) (5 mm)   3. Lumbosacral facet hypertrophy (Multilevel) (Bilateral)   4. Spondylosis without myelopathy or radiculopathy, lumbosacral region   5. DDD (degenerative disc disease), lumbar   6. Chronic low back pain (1ry area of Pain) (Bilateral) w/o sciatica    NAS-11 Pain score:   Pre-procedure: 3 /10   Post-procedure: 0-No pain/10      Effectiveness:  Initial hour after procedure: 100 %. Subsequent 4-6 hours post-procedure: 100 %. Analgesia past initial 6 hours: 90 % (ongoing). Ongoing improvement:  Analgesic:  *** Function:    ***    ROM:    ***     Pharmacotherapy Assessment   Opioid Analgesic: None MME/day: 0 mg/day   Monitoring: [place] PMP: PDMP reviewed during this encounter.       Pharmacotherapy: No side-effects or adverse reactions reported. Compliance: No problems identified. Effectiveness: Clinically acceptable. Plan: Refer to "POC". UDS:  Summary  Date Value Ref Range Status  05/31/2021 Note  Final    Comment:    ==================================================================== Compliance Drug Analysis, Ur ==================================================================== Test                             Result       Flag       Units  Drug Present and Declared for Prescription Verification   Acetaminophen                  PRESENT      EXPECTED   Ibuprofen                      PRESENT      EXPECTED  Drug Present not Declared for Prescription Verification   Carboxy-THC                    997          UNEXPECTED ng/mg creat    Carboxy-THC is a  metabolite of tetrahydrocannabinol (THC). Source of    THC is most commonly herbal marijuana or marijuana-based products,    but THC is also present in a scheduled prescription medication.    Trace amounts of THC can be present in hemp and cannabidiol (CBD)    products. This test is not intended to distinguish between delta-9-    tetrahydrocannabinol, the predominant form of THC in most herbal or    marijuana-based products, and delta-8-tetrahydrocannabinol.  Drug Absent but Declared for Prescription Verification   Methocarbamol                  Not Detected UNEXPECTED   Salicylate                     Not Detected UNEXPECTED    Aspirin, as indicated in the declared medication list, is not always    detected even when used as directed.  ==================================================================== Test                      Result    Flag   Units  Ref Range   Creatinine              75               mg/dL      >=20 ==================================================================== Declared Medications:  The flagging and interpretation on this report are based on the  following declared medications.  Unexpected results may arise from  inaccuracies in the declared medications.   **Note: The testing scope of this panel includes these medications:   Methocarbamol (Robaxin)   **Note: The testing scope of this panel does not include small to  moderate amounts of these reported medications:   Acetaminophen (Tylenol)  Aspirin  Ibuprofen (Advil)   **Note: The testing scope of this panel does not include the  following reported medications:   Calcium (Tums)  Multivitamin  Umeclidinium (Anoro)  Vilanterol (Anoro)  Vitamin D2 (Drisdol) ==================================================================== For clinical consultation, please call [phone]. ====================================================================    Lab Results  Component Value Date    CBDTHCR Comment 05/31/2021   D8THCCBX >500 05/31/2021   D9THCCBX 117.4 05/31/2021     Laboratory Chemistry Profile   Renal Lab Results  Component Value Date   BUN 25 (H) 05/31/2021   CREATININE 0.68 05/31/2021   GFR 84.17 09/20/2016   GFRAA >60 01/16/2019   GFRNONAA >60 05/31/2021    Hepatic Lab Results  Component Value Date   AST 28 05/31/2021   ALT 29 05/31/2021   ALBUMIN 3.8 05/31/2021   ALKPHOS 79 05/31/2021    Electrolytes Lab Results  Component Value Date   NA 139 05/31/2021   K 4.2 05/31/2021   CL 107 05/31/2021   CALCIUM 9.4 05/31/2021   MG 2.3 05/31/2021    Bone Lab Results  Component Value Date   VD25OH 32.18 09/20/2016   25OHVITD1 69 05/31/2021   25OHVITD2 36 05/31/2021   25OHVITD3 33 05/31/2021    Inflammation (CRP: Acute Phase) (ESR: Chronic Phase) Lab Results  Component Value Date   CRP 0.7 05/31/2021   ESRSEDRATE 5 05/31/2021         Note: Above Lab results reviewed.  Imaging  DG PAIN CLINIC C-ARM 1-60 MIN NO REPORT Fluoro was used, but no Radiologist interpretation will be provided.  Please refer to "NOTES" tab for provider progress note.  Assessment  The primary encounter diagnosis was Lumbar facet syndrome (Bilateral). Diagnoses of Chronic low back pain (1ry area of Pain) (Bilateral) w/o sciatica, Chronic lower extremity pain (3ry area of Pain) (nonradicular) (Bilateral), and Chronic hip pain (2ry area of Pain) (Bilateral) (R>L) were also pertinent to this visit.  Plan of Care  Problem-specific:  No problem-specific Assessment & Plan notes found for this encounter.  Samantha Cox has a current medication list which includes the following long-term medication(s): atorvastatin and methocarbamol.  Pharmacotherapy (Medications Ordered): No orders of the defined types were placed in this encounter.  Orders:  No orders of the defined types were placed in this encounter.  Follow-up plan:   No follow-ups on file.      Interventional Therapies  Risk  Complexity Considerations:   Estimated body mass index is 30.36 kg/m as calculated from the following:   Height as of this encounter: $RemoveBeforeD'5\' 2"'[id number]$  (1.575 m).   Weight as of this encounter: 166 lb (75.3 kg). (05/31/2021) Abnormal UDS (+) Carboxy-THC (MARIJUANA)  POOR RFA Candidate. Non-compliant w/ instructions not to move.    Planned  Pending:   Diagnostic bilateral lumbar facet MBB #1 + left SI joint Blk #1  Under consideration:   Diagnostic bilateral lumbar facet RFA. POOR RFA Candidate. Non-compliant w/ instructions not to move.    Completed:  (Non-compliant: Constant movement)  Diagnostic bilateral lumbar facet MBB x1 (07/27/2021) (100/100/90/90) (100% LEP relief)  Diagnostic left SI joint Blk x1 (07/27/2021) (100/100/90/90) (100% LEP relief)    Completed by other providers:   ([facility])  [place] (Ultrasound-guided) right greater trochanteric inj. x1 by Dr. Gregor Hams (02/04/2020)  [place] right greater trochanteric bursa inj. x3 by Dr. Hulan Saas (11/25/2014; 07/10/2018; 10/19/2018)  [place] right SI joint inj. x1 by Dr. Gregor Hams (06/05/2019)  [place] left gluteus medius insertion greater trochanteric inj. x1 by Dr. Gregor Hams (01/29/2019)  [place] left piriformis tendon sheath inj. x3 by Dr. Hulan Saas (12/02/2016; 08/23/2017; 02/15/2018)  [place] left foot neuroma inj. x1 (2 neuromas between 3rd & 4th, and 4th & 5th toes) by Dr. Hulan Saas (07/07/2016)  [place] right hip joint inj. x1 by Dr. Hulan Saas (12/04/2014)  Diagnostic bilateral L5 TFESI x1 (03/12/2021) by Girtha Hake, MD (PMR) [facility])  Diagnostic left L5 dorsal ramus and S1, S2, and S3 lateral branch Blk x1, by Charlett Blake, MD (09/10/2019)  Therapeutic left L5 dorsal ramus and S1, S2, and S3 lateral branch RFA x2, by Charlett Blake, MD (10/04/2019 & 04/10/2020) (1st provide her with 50% relief of the pain.  2nd made things worse)   Therapeutic  Palliative (PRN) options:   None  established        Recent Visits Date Type Provider Dept  09/21/21 Procedure visit Milinda Pointer, MD [facility]  08/19/21 Office Visit Milinda Pointer, MD [facility]  07/27/21 Procedure visit Milinda Pointer, MD [facility]  07/06/21 Office Visit Milinda Pointer, MD [facility]  Showing recent visits within past 90 days and meeting all other requirements Future Appointments Date Type Provider Dept  10/05/21 Office Visit Milinda Pointer, MD [facility]  Showing future appointments within next 90 days and meeting all other requirements  I discussed the assessment and treatment plan with the patient. The patient was provided an opportunity to ask questions and all were answered. The patient agreed with the plan and demonstrated an understanding of the instructions.  Patient advised to call back or seek an in-person evaluation if the symptoms or condition worsens.  Duration of encounter: *** minutes.  Note by: Gaspar Cola, MD Date: 10/05/2021; Time: 2:29 PM

## 2021-10-05 ENCOUNTER — Ambulatory Visit: Payer: BC Managed Care – PPO | Attending: Pain Medicine | Admitting: Pain Medicine

## 2021-10-05 DIAGNOSIS — M545 Low back pain, unspecified: Secondary | ICD-10-CM | POA: Diagnosis not present

## 2021-10-05 DIAGNOSIS — M25551 Pain in right hip: Secondary | ICD-10-CM | POA: Diagnosis not present

## 2021-10-05 DIAGNOSIS — M79604 Pain in right leg: Secondary | ICD-10-CM | POA: Diagnosis not present

## 2021-10-05 DIAGNOSIS — M47816 Spondylosis without myelopathy or radiculopathy, lumbar region: Secondary | ICD-10-CM | POA: Diagnosis not present

## 2021-10-05 DIAGNOSIS — G8929 Other chronic pain: Secondary | ICD-10-CM

## 2021-10-05 DIAGNOSIS — M25552 Pain in left hip: Secondary | ICD-10-CM

## 2021-10-05 DIAGNOSIS — M79605 Pain in left leg: Secondary | ICD-10-CM

## 2021-10-07 DIAGNOSIS — F4323 Adjustment disorder with mixed anxiety and depressed mood: Secondary | ICD-10-CM | POA: Diagnosis not present

## 2021-10-08 DIAGNOSIS — G4700 Insomnia, unspecified: Secondary | ICD-10-CM | POA: Diagnosis not present

## 2021-10-14 DIAGNOSIS — J438 Other emphysema: Secondary | ICD-10-CM | POA: Diagnosis not present

## 2021-10-14 DIAGNOSIS — F4321 Adjustment disorder with depressed mood: Secondary | ICD-10-CM | POA: Diagnosis not present

## 2021-10-25 DIAGNOSIS — F4323 Adjustment disorder with mixed anxiety and depressed mood: Secondary | ICD-10-CM | POA: Diagnosis not present

## 2021-10-27 ENCOUNTER — Other Ambulatory Visit: Payer: Self-pay | Admitting: Cardiology

## 2021-10-27 DIAGNOSIS — I2584 Coronary atherosclerosis due to calcified coronary lesion: Secondary | ICD-10-CM

## 2021-10-27 DIAGNOSIS — E7800 Pure hypercholesterolemia, unspecified: Secondary | ICD-10-CM

## 2021-10-28 DIAGNOSIS — Z01419 Encounter for gynecological examination (general) (routine) without abnormal findings: Secondary | ICD-10-CM | POA: Diagnosis not present

## 2021-12-02 ENCOUNTER — Ambulatory Visit: Payer: BC Managed Care – PPO | Admitting: Pain Medicine

## 2021-12-07 ENCOUNTER — Other Ambulatory Visit: Payer: Self-pay | Admitting: Infectious Diseases

## 2021-12-07 DIAGNOSIS — F4323 Adjustment disorder with mixed anxiety and depressed mood: Secondary | ICD-10-CM | POA: Diagnosis not present

## 2021-12-07 DIAGNOSIS — Z1231 Encounter for screening mammogram for malignant neoplasm of breast: Secondary | ICD-10-CM

## 2022-01-07 DIAGNOSIS — L578 Other skin changes due to chronic exposure to nonionizing radiation: Secondary | ICD-10-CM | POA: Diagnosis not present

## 2022-01-07 DIAGNOSIS — L821 Other seborrheic keratosis: Secondary | ICD-10-CM | POA: Diagnosis not present

## 2022-01-07 DIAGNOSIS — L814 Other melanin hyperpigmentation: Secondary | ICD-10-CM | POA: Diagnosis not present

## 2022-01-07 DIAGNOSIS — L570 Actinic keratosis: Secondary | ICD-10-CM | POA: Diagnosis not present

## 2022-01-07 DIAGNOSIS — Z85828 Personal history of other malignant neoplasm of skin: Secondary | ICD-10-CM | POA: Diagnosis not present

## 2022-02-10 ENCOUNTER — Ambulatory Visit: Payer: BC Managed Care – PPO

## 2022-02-17 NOTE — Progress Notes (Signed)
PROVIDER NOTE: Information contained herein reflects review and annotations entered in association with encounter. Interpretation of such information and data should be left to medically-trained personnel. Information provided to patient can be located elsewhere in the medical record under "Patient Instructions". Document created using STT-dictation technology, any transcriptional errors that may result from process are unintentional.    Patient: Samantha Cox  Service Category: E/M  Provider: Oswaldo Done, MD  DOB: 1955/12/03  DOS: 02/21/2022  Referring Provider: Mick Sell, MD  MRN: [id number]  Specialty: Interventional Pain Management  PCP: Mick Sell, MD  Type: Established Patient  Setting: Ambulatory outpatient    Location: Office  Delivery: Face-to-face     HPI  [name], a 67 y.o. year old female, is here today because of her Chronic bilateral low back pain without sciatica [M54.50, G89.29]. Ms. Gosser primary complain today is Back Pain (low) Last encounter: My last encounter with her was on 10/05/2021. Pertinent problems: Ms. Oncale has Degenerative arthritis of hip; Greater trochanteric bursitis of hip (Left); Piriformis syndrome of left side; Morton's neuroma of foot (Left); Leg cramping; Degenerative lumbar spinal stenosis; Greater trochanteric bursitis of hip (Right); Facet arthritis of lumbar region; Chronic SI joint pain (Left); Hamstring tendinitis at origin; Myalgia due to statin; Cervical intraepithelial neoplasia grade 1; Chronic back pain; Degeneration of lumbar intervertebral disc; Enthesopathy of hip region (Right); Metatarsalgia of foot (Left); Pain in foot (Left); Plantar fasciitis of foot (Left); Chronic pain syndrome; Abnormal MRI, lumbar spine (07/28/2019); Cervicalgia (4th area of Pain); Chronic low back pain (1ry area of Pain) (Bilateral) w/o sciatica; Lumbar facet syndrome (Bilateral); Lumbosacral facet hypertrophy (Multilevel)  (Bilateral); Chronic lower extremity pain (3ry area of Pain) (nonradicular) (Bilateral); Cervicogenic headache (6th area of Pain); Chronic shoulder pain (5th area of Pain) (Bilateral); Chronic hip pain (2ry area of Pain) (Bilateral) (R>L); DDD (degenerative disc disease), lumbar; Osteoarthritis of acromioclavicular joints (Bilateral); Spondylosis of lumbar spine (Multilevel); Lumbar lateral recess stenosis (Left: L2-3, L3-4) (Right: L2-3, L4-5); Lumbar foraminal stenosis (Left: L2-3, L3-4, L4-5) (Right: L3-4, L4-5); Dextroscoliosis of lumbar spine (apex at L3-4); Grade 1 Anterolisthesis of lumbar spine (L4/L5) (5 mm); Osteoarthritis of hips (Bilateral) (L>R); Tendinopathy of gluteus medius (Right); Proximal hamstring tendon low-grade tear, sequela (Right); Superior labral tear (low-grade, partial-thickness) of hip joint, sequela (Right); Spondylosis without myelopathy or radiculopathy, lumbosacral region; Chronic musculoskeletal pain; and Other spondylosis, sacral and sacrococcygeal region on their pertinent problem list. Pain Assessment: Severity of Chronic pain is reported as a 7 /10. Location: Back Lower/radiates down left leg in the back to knee, and left 4th toe cramps. Onset: More than a month ago. Quality: Aching, Constant, Sore. Timing: Constant. Modifying factor(s): nothing. Vitals:  height is 5\' 5"  (1.651 m) and weight is 154 lb (69.9 kg). Her temperature is 97.3 F (36.3 C) (abnormal). Her blood pressure is 118/77 and her pulse is 72. Her respiration is 16 and oxygen saturation is 97%.  BMI: Estimated body mass index is 25.63 kg/m as calculated from the following:   Height as of this encounter: 5\' 5"  (1.651 m).   Weight as of this encounter: 154 lb (69.9 kg).  Reason for encounter: evaluation for possible interventional PM therapy/treatment.  The patient indicates recurrence of her bilateral low back pain.  Previously we had successfully treated this with lumbar facet blocks.  The patient's  last interventional treatment was on 09/21/2021 at which time we did a diagnostic bilateral lumbar facet block #2.  This provided the patient with  100% relief of the pain for the duration of the local anesthetic followed by an ongoing 90% improvement of her low back pain at the time of her follow-up visit.  It also completely eliminated her lower extremity pain proving that this was referred pain from the facet joints.  She indicates that she would like to have this procedure repeated.  Physical exam today was positive for exact reproduction of her bilateral low back pain with hyperextension or rotation and Kemp maneuvers.  Patrick maneuver was positive bilaterally for hip joint arthralgia with decreased range of motion of the left hip.  However the patient indicated that that is not the pain that she has been experiencing.  The maneuver was also completely negative for sacroiliac joint arthralgia at this time.  Based on the above, I think it would be very appropriate to go ahead and repeat the bilateral lumbar facet block.  She has multilevel lumbar facet arthropathy and hypertrophy documented on her last lumbar MRI.  However, she also has an anterolisthesis of L4 over L5 likely to be responsible for the majority of her pain.  For this reason, we will concentrate on that area.  The plan was shared with the patient who understood and accepted.  The patient is not an appropriate candidate for radiofrequency ablation since she is unable to stay still enough to perform radiofrequency ablation.  Pharmacotherapy Assessment  Analgesic: No chronic opioid analgesics therapy prescribed by our practice. None. (05/31/2021) Abnormal UDS (+) Carboxy-THC MME/day: 0 mg/day   Monitoring: [place] PMP: PDMP reviewed during this encounter.       Pharmacotherapy: No side-effects or adverse reactions reported. Compliance: No problems identified. Effectiveness: Clinically acceptable.  Dewayne Shorter, RN  02/21/2022  8:14 AM  Sign  when Signing Visit Safety precautions to be maintained throughout the outpatient stay will include: orient to surroundings, keep bed in low position, maintain call bell within reach at all times, provide assistance with transfer out of bed and ambulation.    CBD:THC Ratio  Date Value Ref Range Status  05/31/2021 Comment RATIO Final    Comment:    INTERFERENCE Unable to complete testing due to unknown interference.  This test measures Cannabidiol (CBD) and Tetrahydrocannabinol (THC) and metabolites in urine. The CBD:THC ratio is calculated using the sums of the respective metabolites and is intended to assist in differentiating the presence of Tetrahydrocannabinol [facility]) metabolites due to the use of marijuana or medicinal THC from the presence of THC metabolites due to use of Cannabidiol (CBD) or hemp products that purportedly contain trace amounts of THC.  CBD:THC Ratio          Interpretation ---------      --------------------------------------------- >=10.0         Consistent with the use of CBD products only 1.0 - 9.9      Indeterminate <1.0           Consistent with marijuana, medicinal THC or                mixed use  Interpretive ranges are provided as guidance and should not be considered definitive. Interpretation of results should include consideration of all relevant clinical and diagnostic information.  Analysis performed by chromatography with mass spectrometry. This test was developed and its performance characteristics determined by [facility].  It has not been cleared or approved by the Food and Drug Administration.    Carboxy-Delta-8-THC  Date Value Ref Range Status  05/31/2021 >500 ng/mL Final   Carboxy-Delta-9-THC  Date  Value Ref Range Status  05/31/2021 117.4 ng/mL Final    Comment:    Carboxy-Delta-9-THC is the primary metabolite of Delta-9- Tetrahydrocannabinol. Sources include the prescription medication Dronabinol as well as illicit, recreational,  and medical marijuana and marijuana derived products of the same categories.  Carboxy-Delta-8-THC is the primary metabolite of Delta-8- Tetrahydrocannabinol. Sources are products containing Delta- 8-THC, which is primarily chemically manufactured from cannabidiol (CBD).  Testing Threshold = 2.0 ng/mL  Analysis performed by Liquid Chromatography with Tandem Mass Spectrometry (LC/MS/MS).  This test was developed and its performance characteristics determined by [facility].  It has not been cleared or approved by the Food and Drug Administration.     UDS:  Summary  Date Value Ref Range Status  05/31/2021 Note  Final    Comment:    ==================================================================== Compliance Drug Analysis, Ur ==================================================================== Test                             Result       Flag       Units  Drug Present and Declared for Prescription Verification   Acetaminophen                  PRESENT      EXPECTED   Ibuprofen                      PRESENT      EXPECTED  Drug Present not Declared for Prescription Verification   Carboxy-THC                    997          UNEXPECTED ng/mg creat    Carboxy-THC is a metabolite of tetrahydrocannabinol (THC). Source of    THC is most commonly herbal marijuana or marijuana-based products,    but THC is also present in a scheduled prescription medication.    Trace amounts of THC can be present in hemp and cannabidiol (CBD)    products. This test is not intended to distinguish between delta-9-    tetrahydrocannabinol, the predominant form of THC in most herbal or    marijuana-based products, and delta-8-tetrahydrocannabinol.  Drug Absent but Declared for Prescription Verification   Methocarbamol                  Not Detected UNEXPECTED   Salicylate                     Not Detected UNEXPECTED    Aspirin, as indicated in the declared medication list, is not always    detected even when  used as directed.  ==================================================================== Test                      Result    Flag   Units      Ref Range   Creatinine              75               mg/dL      >=20 ==================================================================== Declared Medications:  The flagging and interpretation on this report are based on the  following declared medications.  Unexpected results may arise from  inaccuracies in the declared medications.   **Note: The testing scope of this panel includes these medications:   Methocarbamol (Robaxin)   **Note: The testing scope of this panel does not include small  to  moderate amounts of these reported medications:   Acetaminophen (Tylenol)  Aspirin  Ibuprofen (Advil)   **Note: The testing scope of this panel does not include the  following reported medications:   Calcium (Tums)  Multivitamin  Umeclidinium (Anoro)  Vilanterol (Anoro)  Vitamin D2 (Drisdol) ==================================================================== For clinical consultation, please call [phone]. ====================================================================       ROS  Constitutional: Denies any fever or chills Gastrointestinal: No reported hemesis, hematochezia, vomiting, or acute GI distress Musculoskeletal: Denies any acute onset joint swelling, redness, loss of ROM, or weakness Neurological: No reported episodes of acute onset apraxia, aphasia, dysarthria, agnosia, amnesia, paralysis, loss of coordination, or loss of consciousness  Medication Review  Multi-Vitamin, NON FORMULARY, Vitamin D (Ergocalciferol), atorvastatin, ibuprofen, methocarbamol, and umeclidinium-vilanterol  History Review  Allergy: Ms. Leinbach is allergic to crestor [rosuvastatin], simvastatin, and augmentin [amoxicillin-pot clavulanate]. Drug: Ms. Frances  reports no history of drug use. Alcohol:  reports current alcohol use of about 1.0  standard drink of alcohol per week. Tobacco:  reports that she quit smoking about 9 years ago. Her smoking use included e-cigarettes and cigarettes. She has never used smokeless tobacco. Social: Ms. Kinzler  reports that she quit smoking about 9 years ago. Her smoking use included e-cigarettes and cigarettes. She has never used smokeless tobacco. She reports current alcohol use of about 1.0 standard drink of alcohol per week. She reports that she does not use drugs. Medical:  has a past medical history of History of tobacco use, Routine general medical examination at a health care facility, and Screening for lipoid disorders. Surgical: Ms. Ladouceur  has a past surgical history that includes Other surgical history. Family: family history includes Asthma in her mother; Atrial fibrillation in her father; Bladder Cancer in her father; CVA (age of onset: 5) in her father; Diverticulitis in her brother; Hypertension in her mother; Lung cancer in her brother.  Laboratory Chemistry Profile   Renal Lab Results  Component Value Date   BUN 25 (H) 05/31/2021   CREATININE 0.68 05/31/2021   GFR 84.17 09/20/2016   GFRAA >60 01/16/2019   GFRNONAA >60 05/31/2021    Hepatic Lab Results  Component Value Date   AST 28 05/31/2021   ALT 29 05/31/2021   ALBUMIN 3.8 05/31/2021   ALKPHOS 79 05/31/2021    Electrolytes Lab Results  Component Value Date   NA 139 05/31/2021   K 4.2 05/31/2021   CL 107 05/31/2021   CALCIUM 9.4 05/31/2021   MG 2.3 05/31/2021    Bone Lab Results  Component Value Date   VD25OH 32.18 09/20/2016   25OHVITD1 69 05/31/2021   25OHVITD2 36 05/31/2021   25OHVITD3 33 05/31/2021    Inflammation (CRP: Acute Phase) (ESR: Chronic Phase) Lab Results  Component Value Date   CRP 0.7 05/31/2021   ESRSEDRATE 5 05/31/2021         Note: Above Lab results reviewed.  Recent Imaging Review  DG PAIN CLINIC C-ARM 1-60 MIN NO REPORT Fluoro was used, but no Radiologist interpretation  will be provided.  Please refer to "NOTES" tab for provider progress note. Note: Reviewed        Physical Exam  General appearance: Well nourished, well developed, and well hydrated. In no apparent acute distress Mental status: Alert, oriented x 3 (person, place, & time)       Respiratory: No evidence of acute respiratory distress Eyes: PERLA Vitals: BP 118/77 (BP Location: Right Arm, Patient Position: Sitting, Cuff Size: Normal)   Pulse 72  Temp (!) 97.3 F (36.3 C)   Resp 16   Ht 5\' 5"  (1.651 m)   Wt 154 lb (69.9 kg)   SpO2 97%   BMI 25.63 kg/m  BMI: Estimated body mass index is 25.63 kg/m as calculated from the following:   Height as of this encounter: 5\' 5"  (1.651 m).   Weight as of this encounter: 154 lb (69.9 kg). Ideal: Ideal body weight: 57 kg (125 lb 10.6 oz) Adjusted ideal body weight: 62.1 kg (137 lb)  Assessment   Diagnosis Status  1. Chronic low back pain (1ry area of Pain) (Bilateral) w/o sciatica   2. Chronic hip pain (2ry area of Pain) (Bilateral) (R>L)   3. Chronic lower extremity pain (3ry area of Pain) (nonradicular) (Bilateral)   4. Grade 1 Anterolisthesis of lumbar spine (L4/L5) (5 mm)   5. Facet arthritis of lumbar region   6. Lumbosacral facet hypertrophy (Multilevel) (Bilateral)   7. Lumbar facet syndrome (Bilateral)   8. Abnormal MRI, lumbar spine (07/28/2019)   9. Spondylosis without myelopathy or radiculopathy, lumbosacral region    Worsened Persistent Resolved   Updated Problems: Problem  Generalized Abdominal Pain  Diarrhea  Lentigo  Actinic Keratosis  Seborrheic Keratoses  Sun-Damaged Skin  Incontinence of Feces    Plan of Care  Problem-specific:  No problem-specific Assessment & Plan notes found for this encounter.  Ms. ODETTE WATANABE has a current medication list which includes the following long-term medication(s): atorvastatin and methocarbamol.  Pharmacotherapy (Medications Ordered): No orders of the defined types were  placed in this encounter.  Orders:  Orders Placed This Encounter  Procedures   LUMBAR FACET(MEDIAL BRANCH NERVE BLOCK) MBNB    Standing Status:   Future    Standing Expiration Date:   05/23/2022    Scheduling Instructions:     Procedure: Lumbar facet block (AKA.: Lumbosacral medial branch nerve block)     Side: Bilateral     Level: L4-5 and L5-S1 Facets (L3, L4, L5, and S1 Medial Branch)     Sedation: With Sedation.     Timeframe: ASAA    Order Specific Question:   Where will this procedure be performed?    Answer:   [facility]   Follow-up plan:   Return for (ECT): (B) L-FCT Blk (L4-5, L5-S1).     Interventional Therapies  Risk  Complexity Considerations:   (05/31/2021) Abnormal UDS (+) Carboxy-THC ([place])  POOR RFA Candidate. Non-compliant w/ instructions not to move.    Planned  Pending:   Therapeutic bilateral lumbar facet MBB (L4-5, L5-S1) #3    Under consideration:   Therapeutic bilateral lumbar facet MBB #3  NO RFA - unable to stay still    Completed:  (Non-compliant: Constant movement)  Diagnostic bilateral lumbar facet MBB (L2-S1) x2 (09/21/2021) (100/100/90/90) (100% LEP relief)  Diagnostic left SI joint Blk x1 (07/27/2021) (100/100/90/90) (100% LEP relief)    Completed by other providers:   ([place] [facility])  09/23/2021 (Ultrasound-guided) right greater trochanteric inj. x1 by Dr. 07/29/2021 (02/04/2020)  Rodolph Bong right greater trochanteric bursa inj. x3 by Dr. 02/06/2020 (11/25/2014; 07/10/2018; 10/19/2018)  09/09/2018 right SI joint inj. x1 by Dr. 12/19/2018 (06/05/2019)  Rodolph Bong left gluteus medius insertion greater trochanteric inj. x1 by Dr. 06/07/2019 (01/29/2019)  Rodolph Bong left piriformis tendon sheath inj. x3 by Dr. 01/31/2019 (12/02/2016; 08/23/2017; 02/15/2018)  08/25/2017 left foot neuroma inj. x1 (2 neuromas between 3rd & 4th, and 4th & 5th toes) by Dr. 04/16/2018 (  07/07/2016)  [place] right hip joint inj. x1 by Dr. [name] (12/04/2014)  Diagnostic  bilateral L5 TFESI x1 (03/12/2021) by Filomena Jungling, MD (PMR) [facility])  Diagnostic left L5 dorsal ramus and S1, S2, and S3 lateral branch Blk x1, by Erick Colace, MD (09/10/2019)  Therapeutic left L5 dorsal ramus and S1, S2, and S3 lateral branch RFA x2, by Erick Colace, MD (10/04/2019 & 04/10/2020) (1st provide her with 50% relief of the pain.  2nd made things worse)   Therapeutic  Palliative (PRN) options:   Therapeutic/palliative lumbar facet MBB       Recent Visits No visits were found meeting these conditions. Showing recent visits within past 90 days and meeting all other requirements Today's Visits Date Type Provider Dept  02/21/22 Office Visit Delano Metz, MD [facility]  Showing today's visits and meeting all other requirements Future Appointments No visits were found meeting these conditions. Showing future appointments within next 90 days and meeting all other requirements  I discussed the assessment and treatment plan with the patient. The patient was provided an opportunity to ask questions and all were answered. The patient agreed with the plan and demonstrated an understanding of the instructions.  Patient advised to call back or seek an in-person evaluation if the symptoms or condition worsens.  Duration of encounter: 30 minutes.  Total time on encounter, as per AMA guidelines included both the face-to-face and non-face-to-face time personally spent by the physician and/or other qualified health care professional(s) on the day of the encounter (includes time in activities that require the physician or other qualified health care professional and does not include time in activities normally performed by clinical staff). Physician's time may include the following activities when performed: Preparing to see the patient (e.g., pre-charting review of records, searching for previously ordered imaging, lab work, and nerve conduction tests) Review of  prior analgesic pharmacotherapies. Reviewing PMP Interpreting ordered tests (e.g., lab work, imaging, nerve conduction tests) Performing post-procedure evaluations, including interpretation of diagnostic procedures Obtaining and/or reviewing separately obtained history Performing a medically appropriate examination and/or evaluation Counseling and educating the patient/family/caregiver Ordering medications, tests, or procedures Referring and communicating with other health care professionals (when not separately reported) Documenting clinical information in the electronic or other health record Independently interpreting results (not separately reported) and communicating results to the patient/ family/caregiver Care coordination (not separately reported)  Note by: Oswaldo Done, MD Date: 02/21/2022; Time: 8:33 AM

## 2022-02-21 ENCOUNTER — Ambulatory Visit: Payer: BC Managed Care – PPO | Attending: Pain Medicine | Admitting: Pain Medicine

## 2022-02-21 ENCOUNTER — Encounter: Payer: Self-pay | Admitting: Pain Medicine

## 2022-02-21 VITALS — BP 118/77 | HR 72 | Temp 97.3°F | Resp 16 | Ht 65.0 in | Wt 154.0 lb

## 2022-02-21 DIAGNOSIS — R937 Abnormal findings on diagnostic imaging of other parts of musculoskeletal system: Secondary | ICD-10-CM | POA: Diagnosis not present

## 2022-02-21 DIAGNOSIS — M25552 Pain in left hip: Secondary | ICD-10-CM | POA: Diagnosis not present

## 2022-02-21 DIAGNOSIS — R197 Diarrhea, unspecified: Secondary | ICD-10-CM | POA: Insufficient documentation

## 2022-02-21 DIAGNOSIS — M47817 Spondylosis without myelopathy or radiculopathy, lumbosacral region: Secondary | ICD-10-CM | POA: Insufficient documentation

## 2022-02-21 DIAGNOSIS — G8929 Other chronic pain: Secondary | ICD-10-CM | POA: Insufficient documentation

## 2022-02-21 DIAGNOSIS — M4316 Spondylolisthesis, lumbar region: Secondary | ICD-10-CM | POA: Insufficient documentation

## 2022-02-21 DIAGNOSIS — M542 Cervicalgia: Secondary | ICD-10-CM

## 2022-02-21 DIAGNOSIS — M79605 Pain in left leg: Secondary | ICD-10-CM | POA: Insufficient documentation

## 2022-02-21 DIAGNOSIS — M47816 Spondylosis without myelopathy or radiculopathy, lumbar region: Secondary | ICD-10-CM | POA: Diagnosis not present

## 2022-02-21 DIAGNOSIS — M79604 Pain in right leg: Secondary | ICD-10-CM | POA: Diagnosis not present

## 2022-02-21 DIAGNOSIS — M545 Low back pain, unspecified: Secondary | ICD-10-CM | POA: Diagnosis not present

## 2022-02-21 DIAGNOSIS — L814 Other melanin hyperpigmentation: Secondary | ICD-10-CM | POA: Insufficient documentation

## 2022-02-21 DIAGNOSIS — L821 Other seborrheic keratosis: Secondary | ICD-10-CM | POA: Insufficient documentation

## 2022-02-21 DIAGNOSIS — L578 Other skin changes due to chronic exposure to nonionizing radiation: Secondary | ICD-10-CM | POA: Insufficient documentation

## 2022-02-21 DIAGNOSIS — R1084 Generalized abdominal pain: Secondary | ICD-10-CM | POA: Insufficient documentation

## 2022-02-21 DIAGNOSIS — M25551 Pain in right hip: Secondary | ICD-10-CM | POA: Insufficient documentation

## 2022-02-21 DIAGNOSIS — H524 Presbyopia: Secondary | ICD-10-CM | POA: Diagnosis not present

## 2022-02-21 DIAGNOSIS — G4486 Cervicogenic headache: Secondary | ICD-10-CM

## 2022-02-21 DIAGNOSIS — L570 Actinic keratosis: Secondary | ICD-10-CM | POA: Insufficient documentation

## 2022-02-21 DIAGNOSIS — R159 Full incontinence of feces: Secondary | ICD-10-CM | POA: Insufficient documentation

## 2022-02-21 DIAGNOSIS — H2513 Age-related nuclear cataract, bilateral: Secondary | ICD-10-CM | POA: Diagnosis not present

## 2022-02-21 NOTE — Progress Notes (Signed)
Safety precautions to be maintained throughout the outpatient stay will include: orient to surroundings, keep bed in low position, maintain call bell within reach at all times, provide assistance with transfer out of bed and ambulation.

## 2022-02-21 NOTE — Patient Instructions (Addendum)
______________________________________________________________________  Preparing for your procedure  During your procedure appointment there will be: No Prescription Refills. No disability issues to discussed. No medication changes or discussions.  Instructions: Food intake: Avoid eating anything solid for at least 8 hours prior to your procedure. Clear liquid intake: You may take clear liquids such as water up to 2 hours prior to your procedure. (No carbonated drinks. No soda.) Transportation: Unless otherwise stated by your physician, bring a driver. Morning Medicines: Except for blood thinners, take all of your other morning medications with a sip of water. Make sure to take your heart and blood pressure medicines. If your blood pressure's lower number is above 100, the case will be rescheduled. Blood thinners: Make sure to stop your blood thinners as instructed.  If you take a blood thinner, but were not instructed to stop it, call our office [phone] and ask to talk to a nurse. Not stopping a blood thinner prior to certain procedures could lead to serious complications. Diabetics on insulin: Notify the staff so that you can be scheduled 1st case in the morning. If your diabetes requires high dose insulin, take only  of your normal insulin dose the morning of the procedure and notify the staff that you have done so. Preventing infections: Shower with an antibacterial soap the morning of your procedure.  Build-up your immune system: Take 1000 mg of Vitamin C with every meal (3 times a day) the day prior to your procedure. Antibiotics: Inform the nursing staff if you are taking any antibiotics or if you have any conditions that may require antibiotics prior to procedures. (Example: recent joint implants)   Pregnancy: If you are pregnant make sure to notify the nursing staff. Not doing so may result in injury to the fetus, including death.  Sickness: If you have a cold, fever, or any  active infections, call and cancel or reschedule your procedure. Receiving steroids while having an infection may result in complications. Arrival: You must be in the facility at least 30 minutes prior to your scheduled procedure. Tardiness: Your scheduled time is also the cutoff time. If you do not arrive at least 15 minutes prior to your procedure, you will be rescheduled.  Children: Do not bring any children with you. Make arrangements to keep them home. Dress appropriately: There is always a possibility that your clothing may get soiled. Avoid long dresses. Valuables: Do not bring any jewelry or valuables.  Reasons to call and reschedule or cancel your procedure: (Following these recommendations will minimize the risk of a serious complication.) Surgeries: Avoid having procedures within 2 weeks of any surgery. (Avoid for 2 weeks before or after any surgery). Flu Shots: Avoid having procedures within 2 weeks of a flu shots or . (Avoid for 2 weeks before or after immunizations). Barium: Avoid having a procedure within 7-10 days after having had a radiological study involving the use of radiological contrast. (Myelograms, Barium swallow or enema study). Heart attacks: Avoid any elective procedures or surgeries for the initial 6 months after a "Myocardial Infarction" (Heart Attack). Blood thinners: It is imperative that you stop these medications before procedures. Let us know if you if you take any blood thinner.  Infection: Avoid procedures during or within two weeks of an infection (including chest colds or gastrointestinal problems). Symptoms associated with infections include: Localized redness, fever, chills, night sweats or profuse sweating, burning sensation when voiding, cough, congestion, stuffiness, runny nose, sore throat, diarrhea, nausea, vomiting, cold or Flu symptoms, recent or   current infections. It is specially important if the infection is over the area that we intend to treat. Heart  and lung problems: Symptoms that may suggest an active cardiopulmonary problem include: cough, chest pain, breathing difficulties or shortness of breath, dizziness, ankle swelling, uncontrolled high or unusually low blood pressure, and/or palpitations. If you are experiencing any of these symptoms, cancel your procedure and contact your primary care physician for an evaluation.  Remember:  Regular Business hours are:  Monday to Thursday 8:00 AM to 4:00 PM  Provider's Schedule: Milinda Pointer, MD:  Procedure days: Tuesday and Thursday 7:30 AM to 4:00 PM  Gillis Santa, MD:  Procedure days: Monday and Wednesday 7:30 AM to 4:00 PM  ______________________________________________________________________    ____________________________________________________________________________________________  General Risks and Possible Complications  Patient Responsibilities: It is important that you read this as it is part of your informed consent. It is our duty to inform you of the risks and possible complications associated with treatments offered to you. It is your responsibility as a patient to read this and to ask questions about anything that is not clear or that you believe was not covered in this document.  Patient's Rights: You have the right to refuse treatment. You also have the right to change your mind, even after initially having agreed to have the treatment done. However, under this last option, if you wait until the last second to change your mind, you may be charged for the materials used up to that point.  Introduction: Medicine is not an Chief Strategy Officer. Everything in Medicine, including the lack of treatment(s), carries the potential for danger, harm, or loss (which is by definition: Risk). In Medicine, a complication is a secondary problem, condition, or disease that can aggravate an already existing one. All treatments carry the risk of possible complications. The fact that a side  effects or complications occurs, does not imply that the treatment was conducted incorrectly. It must be clearly understood that these can happen even when everything is done following the highest safety standards.  No treatment: You can choose not to proceed with the proposed treatment alternative. The "PRO(s)" would include: avoiding the risk of complications associated with the therapy. The "CON(s)" would include: not getting any of the treatment benefits. These benefits fall under one of three categories: diagnostic; therapeutic; and/or palliative. Diagnostic benefits include: getting information which can ultimately lead to improvement of the disease or symptom(s). Therapeutic benefits are those associated with the successful treatment of the disease. Finally, palliative benefits are those related to the decrease of the primary symptoms, without necessarily curing the condition (example: decreasing the pain from a flare-up of a chronic condition, such as incurable terminal cancer).  General Risks and Complications: These are associated to most interventional treatments. They can occur alone, or in combination. They fall under one of the following six (6) categories: no benefit or worsening of symptoms; bleeding; infection; nerve damage; allergic reactions; and/or death. No benefits or worsening of symptoms: In Medicine there are no guarantees, only probabilities. No healthcare provider can ever guarantee that a medical treatment will work, they can only state the probability that it may. Furthermore, there is always the possibility that the condition may worsen, either directly, or indirectly, as a consequence of the treatment. Bleeding: This is more common if the patient is taking a blood thinner, either prescription or over the counter (example: Goody Powders, Fish oil, Aspirin, Garlic, etc.), or if suffering a condition associated with impaired coagulation (example: Hemophilia, cirrhosis  of the liver,  low platelet counts, etc.). However, even if you do not have one on these, it can still happen. If you have any of these conditions, or take one of these drugs, make sure to notify your treating physician. Infection: This is more common in patients with a compromised immune system, either due to disease (example: diabetes, cancer, human immunodeficiency virus [HIV], etc.), or due to medications or treatments (example: therapies used to treat cancer and rheumatological diseases). However, even if you do not have one on these, it can still happen. If you have any of these conditions, or take one of these drugs, make sure to notify your treating physician. Nerve Damage: This is more common when the treatment is an invasive one, but it can also happen with the use of medications, such as those used in the treatment of cancer. The damage can occur to small secondary nerves, or to large primary ones, such as those in the spinal cord and brain. This damage may be temporary or permanent and it may lead to impairments that can range from temporary numbness to permanent paralysis and/or brain death. Allergic Reactions: Any time a substance or material comes in contact with our body, there is the possibility of an allergic reaction. These can range from a mild skin rash (contact dermatitis) to a severe systemic reaction (anaphylactic reaction), which can result in death. Death: In general, any medical intervention can result in death, most of the time due to an unforeseen complication. ____________________________________________________________________________________________    Preparing for Procedure with Sedation Instructions: Oral Intake: Do not eat or drink anything for at least 8 hours prior to your procedure. Transportation: Public transportation is not allowed. Bring an adult driver. The driver must be physically present in our waiting room before any procedure can be started. Physical Assistance: Bring an  adult capable of physically assisting you, in the event you need help. Blood Pressure Medicine: Take your blood pressure medicine with a sip of water the morning of the procedure. Insulin: Take only  of your normal insulin dose. Preventing infections: Shower with an antibacterial soap the morning of your procedure. Build-up your immune system: Take 1000 mg of Vitamin C with every meal (3 times a day) the day prior to your procedure. Pregnancy: If you are pregnant, call and cancel the procedure. Sickness: If you have a cold, fever, or any active infections, call and cancel the procedure. Arrival: You must be in the facility at least 30 minutes prior to your scheduled procedure. Children: Do not bring children with you. Dress appropriately: Bring dark clothing that you would not mind if they get stained. Valuables: Do not bring any jewelry or valuables. Procedure appointments are reserved for interventional treatments only. No Prescription Refills. No medication changes will be discussed during procedure appointments. No disability issues will be discussed. Facet Blocks Patient Information  Description: The facets are joints in the spine between the vertebrae.  Like any joints in the body, facets can become irritated and painful.  Arthritis can also effect the facets.  By injecting steroids and local anesthetic in and around these joints, we can temporarily block the nerve supply to them.  Steroids act directly on irritated nerves and tissues to reduce selling and inflammation which often leads to decreased pain.  Facet blocks may be done anywhere along the spine from the neck to the low back depending upon the location of your pain.   After numbing the skin with local anesthetic (like Novocaine), a small  needle is passed onto the facet joints under x-ray guidance.  You may experience a sensation of pressure while this is being done.  The entire block usually lasts about 15-25 minutes.    Conditions which may be treated by facet blocks:  Low back/buttock pain Neck/shoulder pain Certain types of headaches  Preparation for the injection:  Do not eat any solid food or dairy products within 8 hours of your appointment. You may drink clear liquid up to 3 hours before appointment.  Clear liquids include water, black coffee, juice or soda.  No milk or cream please. You may take your regular medication, including pain medications, with a sip of water before your appointment.  Diabetics should hold regular insulin (if taken separately) and take 1/2 normal NPH dose the morning of the procedure.  Carry some sugar containing items with you to your appointment. A driver must accompany you and be prepared to drive you home after your procedure. Bring all your current medications with you. An IV may be inserted and sedation may be given at the discretion of the physician. A blood pressure cuff, EKG and other monitors will often be applied during the procedure.  Some patients may need to have extra oxygen administered for a short period. You will be asked to provide medical information, including your allergies and medications, prior to the procedure.  We must know immediately if you are taking blood thinners (like Coumadin/Warfarin) or if you are allergic to IV iodine contrast (dye).  We must know if you could possible be pregnant.  Possible side-effects:  Bleeding from needle site Infection (rare, may require surgery) Nerve injury (rare) Numbness & tingling (temporary) Difficulty urinating (rare, temporary) Spinal headache (a headache worse with upright posture) Light-headedness (temporary) Pain at injection site (serveral days) Decreased blood pressure (rare, temporary) Weakness in arm/leg (temporary) Pressure sensation in back/neck (temporary)   Call if you experience:  Fever/chills associated with headache or increased back/neck pain Headache worsened by an upright  position New onset, weakness or numbness of an extremity below the injection site Hives or difficulty breathing (go to the emergency room) Inflammation or drainage at the injection site(s) Severe back/neck pain greater than usual New symptoms which are concerning to you  Please note:  Although the local anesthetic injected can often make your back or neck feel good for several hours after the injection, the pain will likely return. It takes 3-7 days for steroids to work.  You may not notice any pain relief for at least one week.  If effective, we will often do a series of 2-3 injections spaced 3-6 weeks apart to maximally decrease your pain.  After the initial series, you may be a candidate for a more permanent nerve block of the facets.  If you have any questions, please call #[phone] [facility]   ____________________________________________________________________________________________  Patient Information update  To: All of our patients.  Re: Name change.  It has been made official that our current name, "[facility]"   will soon be changed to " [facility]".   The purpose of this change is to eliminate any confusion created by the concept of our practice being a "Medication Management Pain Clinic". In the past this has led to the misconception that we treat pain primarily by the use of prescription medications.  Nothing can be farther from the truth.   Understanding PAIN MANAGEMENT: To further understand  what our practice does, you first have to understand that "Pain Management" is a subspecialty that requires additional training once a physician has completed their specialty training, which can be in either Anesthesia, Neurology, Psychiatry, or Physical Medicine and Rehabilitation (PMR). Each one of these contributes to the final  approach taken by each physician to the management of their patient's pain. To be a "Pain Management Specialist" you must have first completed one of the specialty trainings below.  Anesthesiologists - trained in clinical pharmacology and interventional techniques such as nerve blockade and regional as well as central neuroanatomy. They are trained to block pain before, during, and after surgical interventions.  Neurologists - trained in the diagnosis and pharmacological treatment of complex neurological conditions, such as Multiple Sclerosis, Parkinson's, spinal cord injuries, and other systemic conditions that may be associated with symptoms that may include but are not limited to pain. They tend to rely primarily on the treatment of chronic pain using prescription medications.  Psychiatrist - trained in conditions affecting the psychosocial wellbeing of patients including but not limited to depression, anxiety, schizophrenia, personality disorders, addiction, and other substance use disorders that may be associated with chronic pain. They tend to rely primarily on the treatment of chronic pain using prescription medications.   Physical Medicine and Rehabilitation (PMR) physicians, also known as physiatrists - trained to treat a wide variety of medical conditions affecting the brain, spinal cord, nerves, bones, joints, ligaments, muscles, and tendons. Their training is primarily aimed at treating patients that have suffered injuries that have caused severe physical impairment. Their training is primarily aimed at the physical therapy and rehabilitation of those patients. They may also work alongside orthopedic surgeons or neurosurgeons using their expertise in assisting surgical patients to recover after their surgeries.  INTERVENTIONAL PAIN MANAGEMENT is sub-subspecialty of Pain Management.  Our physicians are Board-certified in Anesthesia, Pain Management, and Interventional Pain Management.  This  meaning that not only have they been trained and Board-certified in their specialty of Anesthesia, and subspecialty of Pain Management, but they have also received further training in the sub-subspecialty of Interventional Pain Management, in order to become Board-certified as INTERVENTIONAL PAIN MANAGEMENT SPECIALIST.    Mission: Our goal is to use our skills in  [place] as alternatives to the chronic use of prescription opioid medications for the treatment of pain. To make this more clear, we have changed our name to reflect what we do and offer. We will continue to offer medication management assessment and recommendations, but we will not be taking over any patient's medication management.  ____________________________________________________________________________________________

## 2022-02-28 ENCOUNTER — Telehealth: Payer: Self-pay | Admitting: Pain Medicine

## 2022-02-28 NOTE — Telephone Encounter (Signed)
Patient wants to know if the PA is done.

## 2022-02-28 NOTE — Telephone Encounter (Signed)
PT stated that she spoke with someone from [facility] and they stated that the procedure was denied. PT wants to see about paying out of pocket. PT stated that she can't stand the pain any longer. Please give patient a call. Thanks

## 2022-03-08 NOTE — Telephone Encounter (Signed)
Spoke with Jocelyn Lamer, ok for patient to pay out of pocket, we need to let her know that although we do not know the exact cost, it is several thousand dollars. Attempted to call patient, message left.

## 2022-03-08 NOTE — Progress Notes (Signed)
Patient: Samantha Cox  Service Category: E/M  Provider: Oswaldo Done, MD  DOB: [birth year]  DOS: 03/09/2022  Location: Office  MRN: [id number]  Setting: Ambulatory outpatient  Referring Provider: Mick Sell, MD  Type: Established Patient  Specialty: Interventional Pain Management  PCP: Mick Sell, MD  Location: Remote location  Delivery: TeleHealth     Virtual Encounter - Pain Management PROVIDER NOTE: Information contained herein reflects review and annotations entered in association with encounter. Interpretation of such information and data should be left to medically-trained personnel. Information provided to patient can be located elsewhere in the medical record under "Patient Instructions". Document created using STT-dictation technology, any transcriptional errors that may result from process are unintentional.    Contact & Pharmacy Preferred: [phone] Home: [phone] (home) Mobile: [phone] (mobile) E-mail: [email or web address]  [facility] [place], [place] - [address] [facility] [address] [place] [address] Phone: [phone] Fax: [phone]  [facility] [place] - [place] - [address] [place] [address] Phone: [phone] Fax: [phone]  [facility] [place], [place] - [address] [facility] [address] [place] [address] Phone: [phone] Fax: [phone]   Pre-screening  Ms. Hornbeck offered "in-person" vs "virtual" encounter. She indicated preferring virtual for this encounter.   Reason COVID-19*  Social distancing based on CDC and AMA recommendations.   I contacted Samantha Cox on 03/09/2022 via telephone.      I clearly identified myself as Oswaldo Done, MD. I verified that I was speaking with the correct person using two identifiers (Name: AILIS FLEEGE, and date of birth:  [birth year]).  Consent I sought verbal advanced consent from Samantha Cox for virtual visit interactions. I informed Ms. Ryle of possible security and privacy concerns, risks, and limitations associated with providing "not-in-person" medical evaluation and management services. I also informed Ms. Grossi of the availability of "in-person" appointments. Finally, I informed her that there would be a charge for the virtual visit and that she could be  personally, fully or partially, financially responsible for it. Ms. Sajdak expressed understanding and agreed to proceed.   Historic Elements   Ms. BOBBETTE UFFORD is a 67 y.o. year old, female patient evaluated today after our last contact on 02/28/2022. Ms. Saban  has a past medical history of History of tobacco use, Routine general medical examination at a health care facility, and Screening for lipoid disorders. She also  has a past surgical history that includes Other surgical history. Ms. Zakarian has a current medication list which includes the following prescription(s): anoro ellipta, ibuprofen, multi-vitamin, NON FORMULARY, vitamin d (ergocalciferol), atorvastatin, and methocarbamol. She  reports that she quit smoking about 9 years ago. Her smoking use included e-cigarettes and cigarettes. She has never used smokeless tobacco. She reports current alcohol use of about 1.0 standard drink of alcohol per week. She reports that she does not use drugs. Ms. Lamphere is allergic to crestor [rosuvastatin], simvastatin, and augmentin [amoxicillin-pot clavulanate].  Estimated body mass index is 25.63 kg/m as calculated from the following:   Height as of 02/21/22: 5\' 5"  (1.651 m).   Weight as of 02/21/22: 154 lb (69.9 kg).  HPI  Today, she is being contacted for  since the patient does not appear to be a good candidate for radiofrequency ablation, the insurance company has decided not to approve repeating the facet blocks.  According  to the  letter received from [facility] and [facility] they are denying the procedure because the patient does not meet medical necessity.  I would like to be politically correct about this but the truth is that they are lying and they are clearly not reading my notes.  According to their denial reason the procedure would be approved if the patient was having joint pain from inflammation in her body.  This is actually the definition of what the patient has.  We have already proven through to diagnostic lumbar facet blocks using steroids that the patient will get 100% relief of her pain for the duration of the local anesthetic followed by up to 90% improvement of her pain which can only come from the steroids which in turn provide relief by decreasing inflammation.  Therefore the patient does comply with the first indication for the use of these injections.  Furthermore there later refers that the patient can have injections if the patient is not a candidate for radiofrequency ablation, which we have clearly stated that she is not because of her inability to go to be compliant with intraoperative orders to stay still for the procedure.  Furthermore, the procedures also indicated according to the letter of denial, if the patient has pain going down her legs, as long as it is due to a cyst compressing a nerve.  Although the patient does have bilateral nonradicular pain, she does not have nerve compression from a cyst which is ridiculous since nerve compression is likely to trigger a radiculopathy which in the specialty of interventional pain management would be a contraindication to the facet block and would require epidural steroid injections rather than facet blocks.  In addition to the above, the patient also has compliant with several physical therapy trials and in fact she is currently on physical therapy.  She has also tried prescription as well as over-the-counter medications for the pain which have failed in  providing her with any significant benefit.  As seen below, the patient has MRI evidence of spondylolisthesis of the lumbar spine affecting several levels with clear evidence of facet joint disease is seen in the description of facet hypertrophy, also at several levels.  COMPARISON:  Lumbar spine radiographs 04/12/2019. MRI lumbar spine 10/03/2016 (07/28/2019) LUMBAR MRI FINDINGS: Segmentation: 5 non rib-bearing lumbar type vertebral bodies are present. The lowest fully formed vertebral body is L5. Alignment: Slight retrolisthesis at L1-2 and L2-3 is new. Grade 1 anterolisthesis at L4-5 is stable. Rightward curvature is centered at L3-4. Vertebrae: Edematous endplate marrow changes the progressed on the left at L3-4. Marrow signal and vertebral body heights are otherwise normal. Conus medullaris and cauda equina: Conus extends to the L1 level. Conus and cauda equina appear normal. Paraspinal and other soft tissues: Limited imaging the abdomen is unremarkable. There is no significant adenopathy. No solid organ lesions are present.   DISC LEVELS: T12-L1: Negative. L1-2: Mild disc bulging and facet hypertrophy is present. No significant stenosis is present. L2-3: A progressive leftward disc protrusion is present. Mild left subarticular and foraminal stenosis is new. L3-4: A broad-based disc protrusion extends into the left neural foramen. Progressive left facet hypertrophy is noted. Moderate left and mild right foraminal narrowing have progressed. Moderate left subarticular stenosis is noted. L4-5: A broad-based disc protrusion and advanced facet hypertrophy have progressed bilaterally. Progressive moderate right subarticular and foraminal narrowing is noted. Mild left foraminal narrowing is stable. L5-S1: Progressive moderate facet hypertrophy is present. A shallow central disc protrusion  is present. No significant stenosis is present.  During other face-to-face visits we have completed physical  examination that has been compatible with bilateral lumbar facet joint disease as shown on positive provocative test of hyperextension or rotation of the lumbar spine as well as the [facility] maneuver.  Statement of Medical Necessity:  Ms. Gaspari continues to experienced debilitating chronic pain from the Lumbosacral Facet Syndrome (Spondylosis without myelopathy or radiculopathy, lumbosacral region [M47.817]).  Indications: Pain severe enough to impact quality of life and/or function.  Duration: This pain has been diagnosed as "Chronic", defined as persistent pain of longer than three (3) months in duration.  Effects: Pain has been reported to be severe enough to impact quality of life and/or function.  Conservative care: Ms. Weyer indicated having tried and either failed tolerate, failed to respond, or was unable to get enough benefit from other more conservative therapies including, but not limited to: 1. Over-the-counter oral analgesic medications (i.e.: ibuprofen, naproxen, etc.) 2. Anti-inflammatory medications 3. Muscle relaxants 4. Membrane stabilizers 5. Opioids  Physical therapy (PT): Ms. Mones indicated having tried and failed physical therapy options such as supervised physical therapy, chiropractic manipulations, and/or home exercise program (HEP), and modalities (Heat, ice, etc.).  Interventional therapies:  Diagnostic lumbar facet nerve blocks have confirmed in at least 2 separate locations the pain to be coming from the facet joints.  Surgical care: Not indicated.  Physical exam: Has been consistent with Lumbosacral Facet Syndrome.  Diagnostic imaging: Lumbosacral Facet Hypertrophy.                Diagnostic interventional therapies: Ms. Gratton has attained 100% reduction in pain from at least two (2) diagnostic medial branch blocks conducted in separate occasions.   For the above listed reason, I believe, as the examining and treating physician, that it is  medically necessary to proceed with  repeat therapeutic lumbar facet blocks  for the purpose of attempting to control the patient's current flareup of her pain and prolong the duration of the benefits seen with the diagnostic injections.  As stated before, the patient is not an ideal candidate for radiofrequency ablation secondary to intraoperative compliance.  For this reason, radiofrequency ablation he has not an option for her at this time.  At this point, after having personally evaluated this patient and being the treating attending physician, it is my professional opinion, as a board-certified pain management specialist, that it is medically necessary for this patient to undergo the treatments that I am ordering.  Any attempt at denying these treatments, by a health care insurance entity, represents a case of "practicing medicine without a license", which is currently illegal in the state of [place].  Furthermore, if this company attempts to use a Educational psychologist" to deny such treatment without having performed a face-to-face evaluation, represents an act of gross negligence and malpractice.  Today, the patient describes being in "absolute agony" secondary to a flareup of her low back pain.  By now, we would have had this pain under control if it was not for the adverse benefit determination of [employer] who is directly and solely responsible for this patient's suffering.  Pharmacotherapy Assessment   Opioid Analgesic: No chronic opioid analgesics therapy prescribed by our practice. None. (05/31/2021) Abnormal UDS (+) Carboxy-THC MME/day: 0 mg/day   Monitoring:  PMP: PDMP not reviewed this encounter.       Pharmacotherapy: No side-effects or adverse reactions reported. Compliance: No problems identified. Effectiveness: Clinically acceptable. Plan: Refer  to "POC". UDS:  Summary  Date Value Ref Range Status  05/31/2021 Note  Final    Comment:     ==================================================================== Compliance Drug Analysis, Ur ==================================================================== Test                             Result       Flag       Units  Drug Present and Declared for Prescription Verification   Acetaminophen                  PRESENT      EXPECTED   Ibuprofen                      PRESENT      EXPECTED  Drug Present not Declared for Prescription Verification   Carboxy-THC                    997          UNEXPECTED ng/mg creat    Carboxy-THC is a metabolite of tetrahydrocannabinol (THC). Source of    THC is most commonly herbal marijuana or marijuana-based products,    but THC is also present in a scheduled prescription medication.    Trace amounts of THC can be present in hemp and cannabidiol (CBD)    products. This test is not intended to distinguish between delta-9-    tetrahydrocannabinol, the predominant form of THC in most herbal or    marijuana-based products, and delta-8-tetrahydrocannabinol.  Drug Absent but Declared for Prescription Verification   Methocarbamol                  Not Detected UNEXPECTED   Salicylate                     Not Detected UNEXPECTED    Aspirin, as indicated in the declared medication list, is not always    detected even when used as directed.  ==================================================================== Test                      Result    Flag   Units      Ref Range   Creatinine              75               mg/dL      >=16 ==================================================================== Declared Medications:  The flagging and interpretation on this report are based on the  following declared medications.  Unexpected results may arise from  inaccuracies in the declared medications.   **Note: The testing scope of this panel includes these medications:   Methocarbamol (Robaxin)   **Note: The testing scope of this panel does not include small  to  moderate amounts of these reported medications:   Acetaminophen (Tylenol)  Aspirin  Ibuprofen (Advil)   **Note: The testing scope of this panel does not include the  following reported medications:   Calcium (Tums)  Multivitamin  Umeclidinium (Anoro)  Vilanterol (Anoro)  Vitamin D2 (Drisdol) ==================================================================== For clinical consultation, please call [phone]. ====================================================================    Lab Results  Component Value Date   CBDTHCR Comment 05/31/2021   D8THCCBX >500 05/31/2021   D9THCCBX 117.4 05/31/2021     Laboratory Chemistry Profile   Renal Lab Results  Component Value Date   BUN 25 (H) 05/31/2021   CREATININE 0.68 05/31/2021   GFR 84.17  09/20/2016   GFRAA >60 01/16/2019   GFRNONAA >60 05/31/2021    Hepatic Lab Results  Component Value Date   AST 28 05/31/2021   ALT 29 05/31/2021   ALBUMIN 3.8 05/31/2021   ALKPHOS 79 05/31/2021    Electrolytes Lab Results  Component Value Date   NA 139 05/31/2021   K 4.2 05/31/2021   CL 107 05/31/2021   CALCIUM 9.4 05/31/2021   MG 2.3 05/31/2021    Bone Lab Results  Component Value Date   VD25OH 32.18 09/20/2016   25OHVITD1 69 05/31/2021   25OHVITD2 36 05/31/2021   25OHVITD3 33 05/31/2021    Inflammation (CRP: Acute Phase) (ESR: Chronic Phase) Lab Results  Component Value Date   CRP 0.7 05/31/2021   ESRSEDRATE 5 05/31/2021         Note: Above Lab results reviewed.  Imaging  DG PAIN CLINIC C-ARM 1-60 MIN NO REPORT Fluoro was used, but no Radiologist interpretation will be provided.  Please refer to "NOTES" tab for provider progress note.  Assessment  The primary encounter diagnosis was Chronic low back pain (1ry area of Pain) (Bilateral) w/o sciatica. Diagnoses of DDD (degenerative disc disease), lumbar, Grade 1 Anterolisthesis of lumbar spine (L4/L5) (5 mm), Lumbar facet syndrome (Bilateral), Chronic hip  pain (2ry area of Pain) (Bilateral) (R>L), Abnormal MRI, lumbar spine (07/28/2019), Chronic lower extremity pain (3ry area of Pain) (nonradicular) (Bilateral) (L>R), Lumbosacral facet hypertrophy (Multilevel) (Bilateral), and Spondylosis without myelopathy or radiculopathy, lumbosacral region were also pertinent to this visit.  Plan of Care  Problem-specific:  No problem-specific Assessment & Plan notes found for this encounter.  Ms. AVIKA BARTL has a current medication list which includes the following long-term medication(s): atorvastatin and methocarbamol.  Pharmacotherapy (Medications Ordered): No orders of the defined types were placed in this encounter.  Orders:  Orders Placed This Encounter  Procedures   Ambulatory referral to Physical Therapy    Referral Priority:   Routine    Referral Type:   Physical Medicine    Referral Reason:   Specialty Services Required    Requested Specialty:   Physical Therapy    Number of Visits Requested:   1   Follow-up plan:   Return for (ECT): (B) (L4-5, L5-S1) L-FCT Blk #3 .     Interventional Therapies  Risk  Complexity Considerations:   (05/31/2021) Abnormal UDS (+) Carboxy-THC (MARIJUANA)  POOR RFA Candidate. Non-compliant w/ instructions not to move.    Planned  Pending:   Therapeutic bilateral lumbar facet MBB (L4-5, L5-S1) #3    Under consideration:   Therapeutic bilateral lumbar facet MBB #3  NO RFA - unable to stay still    Completed:  (Non-compliant: Constant movement)  Diagnostic bilateral lumbar facet MBB (L2-S1) x2 (09/21/2021) (100/100/90/90) (100% LEP relief)  Diagnostic left SI joint Blk x1 (07/27/2021) (100/100/90/90) (100% LEP relief)    Completed by other providers:   ([facility])  [place] (Ultrasound-guided) right greater trochanteric inj. x1 by Dr. Rodolph Bong (02/04/2020)  [place] right greater trochanteric bursa inj. x3 by Dr. [name] (11/25/2014; 07/10/2018; 10/19/2018)  [place] right SI joint inj. x1 by  Dr. Rodolph Bong (06/05/2019)  [place] left gluteus medius insertion greater trochanteric inj. x1 by Dr. Rodolph Bong (01/29/2019)  [place] left piriformis tendon sheath inj. x3 by Dr. [name] (12/02/2016; 08/23/2017; 02/15/2018)  [place] left foot neuroma inj. x1 (2 neuromas between 3rd & 4th, and 4th & 5th toes) by Dr. [name] (07/07/2016)  [place] right hip  joint inj. x1 by Dr. [name] (12/04/2014)  Diagnostic bilateral L5 TFESI x1 (03/12/2021) by Filomena Jungling, MD (PMR) [facility])  Diagnostic left L5 dorsal ramus and S1, S2, and S3 lateral branch Blk x1, by Erick Colace, MD (09/10/2019)  Therapeutic left L5 dorsal ramus and S1, S2, and S3 lateral branch RFA x2, by Erick Colace, MD (10/04/2019 & 04/10/2020) (1st provide her with 50% relief of the pain.  2nd made things worse)   Therapeutic  Palliative (PRN) options:   Therapeutic/palliative lumbar facet MBB       Recent Visits Date Type Provider Dept  02/21/22 Office Visit Delano Metz, MD [facility]  Showing recent visits within past 90 days and meeting all other requirements Today's Visits Date Type Provider Dept  03/09/22 Office Visit Delano Metz, MD [facility]  Showing today's visits and meeting all other requirements Future Appointments No visits were found meeting these conditions. Showing future appointments within next 90 days and meeting all other requirements  I discussed the assessment and treatment plan with the patient. The patient was provided an opportunity to ask questions and all were answered. The patient agreed with the plan and demonstrated an understanding of the instructions.  Patient advised to call back or seek an in-person evaluation if the symptoms or condition worsens.  Duration of encounter: 25 minutes.  Note by: Oswaldo Done, MD Date: 03/09/2022; Time: 12:58 PM

## 2022-03-09 ENCOUNTER — Ambulatory Visit: Payer: BC Managed Care – PPO | Attending: Pain Medicine | Admitting: Pain Medicine

## 2022-03-09 ENCOUNTER — Telehealth: Payer: Self-pay

## 2022-03-09 DIAGNOSIS — G8929 Other chronic pain: Secondary | ICD-10-CM

## 2022-03-09 DIAGNOSIS — M47816 Spondylosis without myelopathy or radiculopathy, lumbar region: Secondary | ICD-10-CM

## 2022-03-09 DIAGNOSIS — M545 Low back pain, unspecified: Secondary | ICD-10-CM | POA: Diagnosis not present

## 2022-03-09 DIAGNOSIS — R937 Abnormal findings on diagnostic imaging of other parts of musculoskeletal system: Secondary | ICD-10-CM

## 2022-03-09 DIAGNOSIS — M47817 Spondylosis without myelopathy or radiculopathy, lumbosacral region: Secondary | ICD-10-CM

## 2022-03-09 DIAGNOSIS — M4316 Spondylolisthesis, lumbar region: Secondary | ICD-10-CM | POA: Diagnosis not present

## 2022-03-09 DIAGNOSIS — M51369 Other intervertebral disc degeneration, lumbar region without mention of lumbar back pain or lower extremity pain: Secondary | ICD-10-CM

## 2022-03-09 DIAGNOSIS — M79605 Pain in left leg: Secondary | ICD-10-CM

## 2022-03-09 DIAGNOSIS — M5136 Other intervertebral disc degeneration, lumbar region: Secondary | ICD-10-CM | POA: Diagnosis not present

## 2022-03-09 DIAGNOSIS — M25552 Pain in left hip: Secondary | ICD-10-CM

## 2022-03-09 DIAGNOSIS — M25551 Pain in right hip: Secondary | ICD-10-CM

## 2022-03-09 DIAGNOSIS — M79604 Pain in right leg: Secondary | ICD-10-CM

## 2022-03-09 NOTE — Patient Instructions (Signed)
  ____________________________________________________________________________________________  Pain Prevention Technique  Definition:   A technique used to minimize the effects of an activity known to cause inflammation or swelling, which in turn leads to an increase in pain.  Purpose: To prevent swelling from occurring. It is based on the fact that it is easier to prevent swelling from happening than it is to get rid of it, once it occurs.  Contraindications: Anyone with allergy or hypersensitivity to the recommended medications. Anyone taking anticoagulants (Blood Thinners) (e.g., Coumadin, Warfarin, Plavix, etc.). Patients in Renal Failure.  Technique: Before you undertake an activity known to cause pain, or a flare-up of your chronic pain, and before you experience any pain, do the following:  On a full stomach, take 4 (four) over the counter Ibuprofens 200mg tablets (Motrin), for a total of 800 mg. In addition, take over the counter Magnesium 400 to 500 mg, before doing the activity.  Six (6) hours later, again on a full stomach, repeat the Ibuprofen. That night, take a warm shower and stretch under the running warm water.  This technique may be sufficient to abort the pain and discomfort before it happens. Keep in mind that it takes a lot less medication to prevent swelling than it takes to eliminate it once it occurs.  ____________________________________________________________________________________________   

## 2022-03-09 NOTE — Telephone Encounter (Signed)
Insurance denied auth for procedure. Notified patient, scheduled her an eval with Dr. Consuela Mimes to discuss. Gave Dr. Dossie Arbour a copy of the denial letter

## 2022-03-15 ENCOUNTER — Other Ambulatory Visit: Payer: Self-pay | Admitting: Cardiology

## 2022-03-15 DIAGNOSIS — I2510 Atherosclerotic heart disease of native coronary artery without angina pectoris: Secondary | ICD-10-CM

## 2022-03-15 DIAGNOSIS — E7800 Pure hypercholesterolemia, unspecified: Secondary | ICD-10-CM

## 2022-04-05 ENCOUNTER — Ambulatory Visit
Admission: RE | Admit: 2022-04-05 | Discharge: 2022-04-05 | Disposition: A | Discharge status: Home / Self Care | Payer: BC Managed Care – PPO | Source: Ambulatory Visit | Attending: Pain Medicine | Admitting: Pain Medicine

## 2022-04-05 ENCOUNTER — Ambulatory Visit: Payer: BC Managed Care – PPO | Attending: Pain Medicine | Admitting: Pain Medicine

## 2022-04-05 ENCOUNTER — Encounter: Payer: Self-pay | Admitting: Pain Medicine

## 2022-04-05 VITALS — BP 129/79 | HR 62 | Temp 97.2°F | Resp 18 | Ht 66.0 in | Wt 155.0 lb

## 2022-04-05 DIAGNOSIS — M47817 Spondylosis without myelopathy or radiculopathy, lumbosacral region: Secondary | ICD-10-CM | POA: Diagnosis not present

## 2022-04-05 DIAGNOSIS — M47816 Spondylosis without myelopathy or radiculopathy, lumbar region: Secondary | ICD-10-CM | POA: Diagnosis not present

## 2022-04-05 DIAGNOSIS — G8929 Other chronic pain: Secondary | ICD-10-CM | POA: Diagnosis not present

## 2022-04-05 DIAGNOSIS — M545 Low back pain, unspecified: Secondary | ICD-10-CM | POA: Diagnosis not present

## 2022-04-05 DIAGNOSIS — M5136 Other intervertebral disc degeneration, lumbar region: Secondary | ICD-10-CM

## 2022-04-05 DIAGNOSIS — M4316 Spondylolisthesis, lumbar region: Secondary | ICD-10-CM | POA: Diagnosis not present

## 2022-04-05 MED ORDER — PENTAFLUOROPROP-TETRAFLUOROETH EX AERO
INHALATION_SPRAY | Freq: Once | CUTANEOUS | Status: AC
  Administered 2022-04-05: 30 via TOPICAL
  Filled 2022-04-05: qty 116

## 2022-04-05 MED ORDER — LIDOCAINE HCL 2 % IJ SOLN
20.0000 mL | Freq: Once | INTRAMUSCULAR | Status: AC
  Administered 2022-04-05: 400 mg

## 2022-04-05 MED ORDER — FENTANYL CITRATE (PF) 100 MCG/2ML IJ SOLN
INTRAMUSCULAR | Status: AC
  Filled 2022-04-05: qty 2

## 2022-04-05 MED ORDER — FENTANYL CITRATE (PF) 100 MCG/2ML IJ SOLN
25.0000 ug | INTRAMUSCULAR | Status: DC | PRN
  Administered 2022-04-05: 50 ug via INTRAVENOUS

## 2022-04-05 MED ORDER — ROPIVACAINE HCL 2 MG/ML IJ SOLN
INTRAMUSCULAR | Status: AC
  Filled 2022-04-05: qty 20

## 2022-04-05 MED ORDER — LACTATED RINGERS IV SOLN: Freq: Once | INTRAVENOUS | Status: AC

## 2022-04-05 MED ORDER — MIDAZOLAM HCL 5 MG/5ML IJ SOLN
INTRAMUSCULAR | Status: AC
  Filled 2022-04-05: qty 5

## 2022-04-05 MED ORDER — TRIAMCINOLONE ACETONIDE 40 MG/ML IJ SUSP
INTRAMUSCULAR | Status: AC
  Filled 2022-04-05: qty 2

## 2022-04-05 MED ORDER — LIDOCAINE HCL 2 % IJ SOLN
INTRAMUSCULAR | Status: AC
  Filled 2022-04-05: qty 20

## 2022-04-05 MED ORDER — TRIAMCINOLONE ACETONIDE 40 MG/ML IJ SUSP
80.0000 mg | Freq: Once | INTRAMUSCULAR | Status: AC
  Administered 2022-04-05: 80 mg

## 2022-04-05 MED ORDER — ROPIVACAINE HCL 2 MG/ML IJ SOLN
18.0000 mL | Freq: Once | INTRAMUSCULAR | Status: AC
  Administered 2022-04-05: 18 mL via PERINEURAL

## 2022-04-05 MED ORDER — MIDAZOLAM HCL 5 MG/5ML IJ SOLN
0.5000 mg | Freq: Once | INTRAMUSCULAR | Status: AC
  Administered 2022-04-05: 1 mg via INTRAVENOUS

## 2022-04-05 NOTE — Progress Notes (Signed)
Safety precautions to be maintained throughout the outpatient stay will include: orient to surroundings, keep bed in low position, maintain call bell within reach at all times, provide assistance with transfer out of bed and ambulation.  

## 2022-04-05 NOTE — Progress Notes (Addendum)
PROVIDER NOTE: Interpretation of information contained herein should be left to medically-trained personnel. Specific patient instructions are provided elsewhere under "Patient Instructions" section of medical record. This document was created in part using STT-dictation technology, any transcriptional errors that may result from this process are unintentional.  Patient: Samantha Cox Type: Established DOB: 05/26/1955 MRN: [id number] PCP: Leonel Ramsay, MD  Service: Procedure DOS: 04/05/2022 Setting: Ambulatory Location: Ambulatory outpatient facility Delivery: Face-to-face Provider: Gaspar Cola, MD Specialty: Interventional Pain Management Specialty designation: 09 Location: Outpatient facility Ref. Prov.: Leonel Ramsay, MD       Interventional Therapy   Procedure: Lumbar Facet, Medial Branch Block(s) #3  Laterality: Bilateral  Level: L3, L4, L5, and S1 Medial Branch Level(s). Injecting these levels blocks the L4-5 and L5-S1 lumbar facet joints. Imaging: Fluoroscopic guidance         Anesthesia: Local anesthesia (1-2% Lidocaine) Anxiolysis: IV Versed 1.0 mg Sedation: Moderate Sedation Fentanyl 1 mL (50 mcg) DOS: 04/05/2022 Performed by: Gaspar Cola, MD  Primary Purpose: Diagnostic/Therapeutic Indications: Low back pain severe enough to impact quality of life or function. 1. Chronic low back pain (1ry area of Pain) (Bilateral) (R>L) w/o sciatica   2. Lumbar facet syndrome (Bilateral)   3. Lumbosacral facet hypertrophy (Multilevel) (Bilateral)   4. Grade 1 Anterolisthesis of lumbar spine (L4/L5) (5 mm)   5. DDD (degenerative disc disease), lumbar   6. Spondylosis without myelopathy or radiculopathy, lumbosacral region    NAS-11 Pain score:   Pre-procedure: 6 /10   Post-procedure: 0-No pain/10     Position / Prep / Materials:  Position: Prone  Prep solution: DuraPrep (Iodine Povacrylex [0.7% available iodine] and Isopropyl Alcohol, 74% w/w) Area  Prepped: Posterolateral Lumbosacral Spine (Wide prep: From the lower border of the scapula down to the end of the tailbone and from flank to flank.)  Materials:  Tray: Block Needle(s):  Type: Spinal  Gauge (G): 22  Length: 3.5-in Qty: 4      Pre-op H&P Assessment:  Samantha Cox is a 67 y.o. (year old), female patient, seen today for interventional treatment. She  has a past surgical history that includes Other surgical history. Samantha Cox has a current medication list which includes the following prescription(s): anoro ellipta, escitalopram, ibuprofen, multi-vitamin, NON FORMULARY, vitamin d (ergocalciferol), atorvastatin, and methocarbamol, and the following Facility-Administered Medications: fentanyl. Her primarily concern today is the Back Pain (lower)  Initial Vital Signs:  Pulse/HCG Rate: 62ECG Heart Rate: (!) 56 (SB) Temp: (!) 97.1 F (36.2 C) Resp: 16 BP: 129/73 SpO2: 99 %  BMI: Estimated body mass index is 25.02 kg/m as calculated from the following:   Height as of this encounter: '5\' 6"'$  (1.676 m).   Weight as of this encounter: 155 lb (70.3 kg).  Risk Assessment: Allergies: Reviewed. She is allergic to crestor [rosuvastatin], simvastatin, and augmentin [amoxicillin-pot clavulanate].  Allergy Precautions: None required Coagulopathies: Reviewed. None identified.  Blood-thinner therapy: None at this time Active Infection(s): Reviewed. None identified. Samantha Cox is afebrile  Site Confirmation: Samantha Cox was asked to confirm the procedure and laterality before marking the site Procedure checklist: Completed Consent: Before the procedure and under the influence of no sedative(s), amnesic(s), or anxiolytics, the patient was informed of the treatment options, risks and possible complications. To fulfill our ethical and legal obligations, as recommended by the American Medical Association's Code of Ethics, I have informed the patient of my clinical impression; the  nature and purpose of the treatment or procedure; the risks, benefits,  and possible complications of the intervention; the alternatives, including doing nothing; the risk(s) and benefit(s) of the alternative treatment(s) or procedure(s); and the risk(s) and benefit(s) of doing nothing. The patient was provided information about the general risks and possible complications associated with the procedure. These may include, but are not limited to: failure to achieve desired goals, infection, bleeding, organ or nerve damage, allergic reactions, paralysis, and death. In addition, the patient was informed of those risks and complications associated to Spine-related procedures, such as failure to decrease pain; infection (i.e.: Meningitis, epidural or intraspinal abscess); bleeding (i.e.: epidural hematoma, subarachnoid hemorrhage, or any other type of intraspinal or peri-dural bleeding); organ or nerve damage (i.e.: Any type of peripheral nerve, nerve root, or spinal cord injury) with subsequent damage to sensory, motor, and/or autonomic systems, resulting in permanent pain, numbness, and/or weakness of one or several areas of the body; allergic reactions; (i.e.: anaphylactic reaction); and/or death. Furthermore, the patient was informed of those risks and complications associated with the medications. These include, but are not limited to: allergic reactions (i.e.: anaphylactic or anaphylactoid reaction(s)); adrenal axis suppression; blood sugar elevation that in diabetics may result in ketoacidosis or comma; water retention that in patients with history of congestive heart failure may result in shortness of breath, pulmonary edema, and decompensation with resultant heart failure; weight gain; swelling or edema; medication-induced neural toxicity; particulate matter embolism and blood vessel occlusion with resultant organ, and/or nervous system infarction; and/or aseptic necrosis of one or more joints. Finally, the  patient was informed that Medicine is not an exact science; therefore, there is also the possibility of unforeseen or unpredictable risks and/or possible complications that may result in a catastrophic outcome. The patient indicated having understood very clearly. We have given the patient no guarantees and we have made no promises. Enough time was given to the patient to ask questions, all of which were answered to the patient's satisfaction. Samantha Cox has indicated that she wanted to continue with the procedure. Attestation: I, the ordering provider, attest that I have discussed with the patient the benefits, risks, side-effects, alternatives, likelihood of achieving goals, and potential problems during recovery for the procedure that I have provided informed consent. Date  Time: 04/05/2022  8:03 AM   Pre-Procedure Preparation:  Monitoring: As per clinic protocol. Respiration, ETCO2, SpO2, BP, heart rate and rhythm monitor placed and checked for adequate function Safety Precautions: Patient was assessed for positional comfort and pressure points before starting the procedure. Time-out: I initiated and conducted the "Time-out" before starting the procedure, as per protocol. The patient was asked to participate by confirming the accuracy of the "Time Out" information. Verification of the correct person, site, and procedure were performed and confirmed by me, the nursing staff, and the patient. "Time-out" conducted as per Joint Commission's Universal Protocol (UP.01.01.01). Time: 0834  Description of Procedure:          Laterality: (see above) Targeted Levels: (see above)  Safety Precautions: Aspiration looking for blood return was conducted prior to all injections. At no point did we inject any substances, as a needle was being advanced. Before injecting, the patient was told to immediately notify me if she was experiencing any new onset of "ringing in the ears, or metallic taste in the mouth". No  attempts were made at seeking any paresthesias. Safe injection practices and needle disposal techniques used. Medications properly checked for expiration dates. SDV (single dose vial) medications used. After the completion of the procedure, all disposable equipment used  was discarded in the proper designated medical waste containers. Local Anesthesia: Protocol guidelines were followed. The patient was positioned over the fluoroscopy table. The area was prepped in the usual manner. The time-out was completed. The target area was identified using fluoroscopy. A 12-in long, straight, sterile hemostat was used with fluoroscopic guidance to locate the targets for each level blocked. Once located, the skin was marked with an approved surgical skin marker. Once all sites were marked, the skin (epidermis, dermis, and hypodermis), as well as deeper tissues (fat, connective tissue and muscle) were infiltrated with a small amount of a short-acting local anesthetic, loaded on a 10cc syringe with a 25G, 1.5-in  Needle. An appropriate amount of time was allowed for local anesthetics to take effect before proceeding to the next step. Local Anesthetic: Lidocaine 2.0% The unused portion of the local anesthetic was discarded in the proper designated containers. Technical description of process:   L3 Medial Branch Nerve Block (MBB): The target area for the L3 medial branch is at the junction of the postero-lateral aspect of the superior articular process and the superior, posterior, and medial edge of the transverse process of L4. Under fluoroscopic guidance, a Quincke needle was inserted until contact was made with os over the superior postero-lateral aspect of the pedicular shadow (target area). After negative aspiration for blood, 0.5 mL of the nerve block solution was injected without difficulty or complication. The needle was removed intact. L4 Medial Branch Nerve Block (MBB): The target area for the L4 medial branch is at  the junction of the postero-lateral aspect of the superior articular process and the superior, posterior, and medial edge of the transverse process of L5. Under fluoroscopic guidance, a Quincke needle was inserted until contact was made with os over the superior postero-lateral aspect of the pedicular shadow (target area). After negative aspiration for blood, 0.5 mL of the nerve block solution was injected without difficulty or complication. The needle was removed intact. L5 Medial Branch Nerve Block (MBB): The target area for the L5 medial branch is at the junction of the postero-lateral aspect of the superior articular process and the superior, posterior, and medial edge of the sacral ala. Under fluoroscopic guidance, a Quincke needle was inserted until contact was made with os over the superior postero-lateral aspect of the pedicular shadow (target area). After negative aspiration for blood, 0.5 mL of the nerve block solution was injected without difficulty or complication. The needle was removed intact. S1 Medial Branch Nerve Block (MBB): The target area for the S1 medial branch is at the posterior and inferior 6 o'clock position of the L5-S1 facet joint. Under fluoroscopic guidance, the Quincke needle inserted for the L5 MBB was redirected until contact was made with os over the inferior and postero aspect of the sacrum, at the 6 o' clock position under the L5-S1 facet joint (Target area). After negative aspiration for blood, 0.5 mL of the nerve block solution was injected without difficulty or complication. The needle was removed intact.  Once the entire procedure was completed, the treated area was cleaned, making sure to leave some of the prepping solution back to take advantage of its long term bactericidal properties.         Illustration of the posterior view of the lumbar spine and the posterior neural structures. Laminae of L2 through S1 are labeled. DPRL5, dorsal primary ramus of L5; DPRS1,  dorsal primary ramus of S1; DPR3, dorsal primary ramus of L3; FJ, facet (zygapophyseal) joint L3-L4; I, inferior  articular process of L4; LB1, lateral branch of dorsal primary ramus of L1; IAB, inferior articular branches from L3 medial branch (supplies L4-L5 facet joint); IBP, intermediate branch plexus; MB3, medial branch of dorsal primary ramus of L3; NR3, third lumbar nerve root; S, superior articular process of L5; SAB, superior articular branches from L4 (supplies L4-5 facet joint also); TP3, transverse process of L3.  Vitals:   04/05/22 0845 04/05/22 0850 04/05/22 0900 04/05/22 0910  BP: 114/68 121/74 130/80 129/79  Pulse:      Resp: '16 17 18 18  '$ Temp:  (!) 97.2 F (36.2 C)  (!) 97.2 F (36.2 C)  SpO2: 100% 100% 95% 96%  Weight:      Height:         Start Time: 0834 hrs. End Time: 0845 hrs.  Imaging Guidance (Spinal):          Type of Imaging Technique: Fluoroscopy Guidance (Spinal) Indication(s): Assistance in needle guidance and placement for procedures requiring needle placement in or near specific anatomical locations not easily accessible without such assistance. Exposure Time: Please see nurses notes. Contrast: None used. Fluoroscopic Guidance: I was personally present during the use of fluoroscopy. "Tunnel Vision Technique" used to obtain the best possible view of the target area. Parallax error corrected before commencing the procedure. "Direction-depth-direction" technique used to introduce the needle under continuous pulsed fluoroscopy. Once target was reached, antero-posterior, oblique, and lateral fluoroscopic projection used confirm needle placement in all planes. Images permanently stored in EMR. Interpretation: No contrast injected. I personally interpreted the imaging intraoperatively. Adequate needle placement confirmed in multiple planes. Permanent images saved into the patient's record.  Post-operative Assessment:  Post-procedure Vital Signs:  Pulse/HCG Rate:  6269 Temp: (!) 97.2 F (36.2 C) Resp: 18 BP: 129/79 SpO2: 96 %  EBL: None  Complications: No immediate post-treatment complications observed by team, or reported by patient.  Note: The patient tolerated the entire procedure well. A repeat set of vitals were taken after the procedure and the patient was kept under observation following institutional policy, for this type of procedure. Post-procedural neurological assessment was performed, showing return to baseline, prior to discharge. The patient was provided with post-procedure discharge instructions, including a section on how to identify potential problems. Should any problems arise concerning this procedure, the patient was given instructions to immediately contact us, at any time, without hesitation. In any case, we plan to contact the patient by telephone for a follow-up status report regarding this interventional procedure.  Comments:  No additional relevant information.  Plan of Care (POC)  Orders:  Orders Placed This Encounter  Procedures   LUMBAR FACET(MEDIAL BRANCH NERVE BLOCK) MBNB    Scheduling Instructions:     Procedure: Lumbar facet block (AKA.: Lumbosacral medial branch nerve block)     Side: Bilateral     Level: L4-5 and L5-S1 Facets (L3, L4, L5, and S1 Medial Branch Nerves)     Sedation: Patient's choice.     Timeframe: Today    Order Specific Question:   Where will this procedure be performed?    Answer:   [facility]   DG PAIN [facility] C-ARM 1-60 MIN NO REPORT    Intraoperative interpretation by procedural physician at [facility].    Standing Status:   Standing    Number of Occurrences:   1    Order Specific Question:   Reason for exam:    Answer:   Assistance in needle guidance and placement for procedures requiring needle placement  in or near specific anatomical locations not easily accessible without such assistance.   Informed Consent Details: Physician/Practitioner Attestation; Transcribe  to consent form and obtain patient signature    Nursing Order: Transcribe to consent form and obtain patient signature. Note: Always confirm laterality of pain with Samantha Cox, before procedure.    Order Specific Question:   Physician/Practitioner attestation of informed consent for procedure/surgical case    Answer:   I, the physician/practitioner, attest that I have discussed with the patient the benefits, risks, side effects, alternatives, likelihood of achieving goals and potential problems during recovery for the procedure that I have provided informed consent.    Order Specific Question:   Procedure    Answer:   Lumbar Facet Block  under fluoroscopic guidance    Order Specific Question:   Physician/Practitioner performing the procedure    Answer:   Lelar Farewell A. Dossie Arbour MD    Order Specific Question:   Indication/Reason    Answer:   Low Back Pain, with our without leg pain, due to Facet Joint Arthralgia (Joint Pain) Spondylosis (Arthritis of the Spine), without myelopathy or radiculopathy (Nerve Damage).   Provide equipment / supplies at bedside    Procedure tray: "Block Tray" (Disposable  single use) Skin infiltration needle: Regular 1.5-in, 25-G, (x1) Block Needle type: Spinal Amount/quantity: 4 Size: Regular (3.5-inch) Gauge: 22G    Standing Status:   Standing    Number of Occurrences:   1    Order Specific Question:   Specify    Answer:   Block Tray   Chronic Opioid Analgesic:  No chronic opioid analgesics therapy prescribed by our practice. None. (05/31/2021) Abnormal UDS (+) Carboxy-THC MME/day: 0 mg/day   Medications ordered for procedure: Meds ordered this encounter  Medications   lidocaine (XYLOCAINE) 2 % (with pres) injection 400 mg   pentafluoroprop-tetrafluoroeth (GEBAUERS) aerosol   lactated ringers infusion   midazolam (VERSED) 5 MG/5ML injection 0.5-2 mg    Make sure Flumazenil is available in the pyxis when using this medication. If oversedation occurs,  administer 0.2 mg IV over 15 sec. If after 45 sec no response, administer 0.2 mg again over 1 min; may repeat at 1 min intervals; not to exceed 4 doses (1 mg)   fentaNYL (SUBLIMAZE) injection 25-50 mcg    Make sure Narcan is available in the pyxis when using this medication. In the event of respiratory depression (RR< 8/min): Titrate NARCAN (naloxone) in increments of 0.1 to 0.2 mg IV at 2-3 minute intervals, until desired degree of reversal.   ropivacaine (PF) 2 mg/mL (0.2%) (NAROPIN) injection 18 mL   triamcinolone acetonide (KENALOG-40) injection 80 mg   Medications administered: We administered lidocaine, pentafluoroprop-tetrafluoroeth, lactated ringers, midazolam, fentaNYL, ropivacaine (PF) 2 mg/mL (0.2%), and triamcinolone acetonide.  See the medical record for exact dosing, route, and time of administration.  Follow-up plan:   Return in about 2 weeks (around 04/19/2022) for Proc-day (T,Th), (Face2F), (PPE).       Interventional Therapies  Risk  Complexity Considerations:   (05/31/2021) Abnormal UDS (+) Carboxy-THC ([place])  POOR RFA Candidate. Non-compliant w/ instructions not to move.    Planned  Pending:   Therapeutic bilateral lumbar facet MBB (L4-5, L5-S1) #3    Under consideration:   Therapeutic bilateral lumbar facet MBB #3  NO RFA - unable to stay still    Completed:  (Non-compliant: Constant movement)  Diagnostic bilateral lumbar facet MBB (L2-S1) x2 (09/21/2021) (100/100/90/90) (100% LEP relief)  Diagnostic left SI joint Blk x1 (07/27/2021) (100/100/90/90) (  100% LEP relief)    Completed by other providers:   ([facility])  [place] (Ultrasound-guided) right greater trochanteric inj. x1 by Dr. Gregor Hams (02/04/2020)  [place] right greater trochanteric bursa inj. x3 by Dr. Hulan Saas (11/25/2014; 07/10/2018; 10/19/2018)  [place] right SI joint inj. x1 by Dr. Gregor Hams (06/05/2019)  [place] left gluteus medius insertion greater trochanteric inj. x1 by Dr. Gregor Hams  (01/29/2019)  [place] left piriformis tendon sheath inj. x3 by Dr. Hulan Saas (12/02/2016; 08/23/2017; 02/15/2018)  [place] left foot neuroma inj. x1 (2 neuromas between 3rd & 4th, and 4th & 5th toes) by Dr. Hulan Saas (07/07/2016)  [place] right hip joint inj. x1 by Dr. Hulan Saas (12/04/2014)  Diagnostic bilateral L5 TFESI x1 (03/12/2021) by Girtha Hake, MD (PMR) [facility])  Diagnostic left L5 dorsal ramus and S1, S2, and S3 lateral branch Blk x1, by Charlett Blake, MD (09/10/2019)  Therapeutic left L5 dorsal ramus and S1, S2, and S3 lateral branch RFA x2, by Charlett Blake, MD (10/04/2019 & 04/10/2020) (1st provide her with 50% relief of the pain.  2nd made things worse)   Therapeutic  Palliative (PRN) options:   Therapeutic/palliative lumbar facet MBB         Recent Visits Date Type Provider Dept  03/09/22 Office Visit Milinda Pointer, MD [facility]  02/21/22 Office Visit Milinda Pointer, MD [facility]  Showing recent visits within past 90 days and meeting all other requirements Today's Visits Date Type Provider Dept  04/05/22 Procedure visit Milinda Pointer, MD [facility]  Showing today's visits and meeting all other requirements Future Appointments Date Type Provider Dept  04/19/22 Appointment Milinda Pointer, MD [facility]  Showing future appointments within next 90 days and meeting all other requirements  Disposition: Discharge home  Discharge (Date  Time): 04/05/2022; 0914 hrs.   Primary Care Physician: Leonel Ramsay, MD Location: [facility] Note by: Gaspar Cola, MD (TTS technology used. I apologize for any typographical errors that were not detected and corrected.) Date: 04/05/2022; Time: 11:20 AM  Disclaimer:  Medicine is not an Chief Strategy Officer. The only guarantee in medicine is that nothing is guaranteed. It is important to note that the decision to proceed with this  intervention was based on the information collected from the patient. The Data and conclusions were drawn from the patient's questionnaire, the interview, and the physical examination. Because the information was provided in large part by the patient, it cannot be guaranteed that it has not been purposely or unconsciously manipulated. Every effort has been made to obtain as much relevant data as possible for this evaluation. It is important to note that the conclusions that lead to this procedure are derived in large part from the available data. Always take into account that the treatment will also be dependent on availability of resources and existing treatment guidelines, considered by other Pain Management Practitioners as being common knowledge and practice, at the time of the intervention. For Medico-Legal purposes, it is also important to point out that variation in procedural techniques and pharmacological choices are the acceptable norm. The indications, contraindications, technique, and results of the above procedure should only be interpreted and judged by a Board-Certified Interventional Pain Specialist with extensive familiarity and expertise in the same exact procedure and technique.

## 2022-04-05 NOTE — Patient Instructions (Signed)

## 2022-04-06 ENCOUNTER — Telehealth: Payer: Self-pay | Admitting: *Deleted

## 2022-04-06 NOTE — Telephone Encounter (Signed)
No problems post procedure. 

## 2022-04-12 ENCOUNTER — Ambulatory Visit: Payer: BC Managed Care – PPO | Admitting: Pain Medicine

## 2022-04-19 ENCOUNTER — Ambulatory Visit: Payer: BC Managed Care – PPO | Admitting: Pain Medicine

## 2022-04-24 NOTE — Progress Notes (Unsigned)
PROVIDER NOTE: Information contained herein reflects review and annotations entered in association with encounter. Interpretation of such information and data should be left to medically-trained personnel. Information provided to patient can be located elsewhere in the medical record under "Patient Instructions". Document created using STT-dictation technology, any transcriptional errors that may result from process are unintentional.    Patient: Samantha Cox  Service Category: E/M  Provider: Gaspar Cola, MD  DOB: 10-18-1955  DOS: 04/25/2022  Referring Provider: Leonel Ramsay, MD  MRN: [id number]  Specialty: Interventional Pain Management  PCP: Leonel Ramsay, MD  Type: Established Patient  Setting: Ambulatory outpatient    Location: Office  Delivery: Face-to-face     HPI  Samantha Cox, a 67 y.o. year old female, is here today because of her No primary diagnosis found.. Samantha Cox primary complain today is No chief complaint on file.  Pertinent problems: Samantha Cox has Degenerative arthritis of hip; Greater trochanteric bursitis of hip (Left); Piriformis syndrome of left side; Morton's neuroma of foot (Left); Leg cramping; Degenerative lumbar spinal stenosis; Greater trochanteric bursitis of hip (Right); Facet arthritis of lumbar region; Chronic SI joint pain (Left); Hamstring tendinitis at origin; Myalgia due to statin; Cervical intraepithelial neoplasia grade 1; Chronic back pain; Degeneration of lumbar intervertebral disc; Enthesopathy of hip region (Right); Metatarsalgia of foot (Left); Pain in foot (Left); Plantar fasciitis of foot (Left); Chronic pain syndrome; Abnormal MRI, lumbar spine (07/28/2019); Cervicalgia (4th area of Pain); Chronic low back pain (1ry area of Pain) (Bilateral) (R>L) w/o sciatica; Lumbar facet syndrome (Bilateral); Lumbosacral facet hypertrophy (Multilevel) (Bilateral); Chronic lower extremity pain (3ry area of Pain) (nonradicular)  (Bilateral) (L>R); Cervicogenic headache (6th area of Pain); Chronic shoulder pain (5th area of Pain) (Bilateral); Chronic hip pain (2ry area of Pain) (Bilateral) (R>L); DDD (degenerative disc disease), lumbar; Osteoarthritis of acromioclavicular joints (Bilateral); Spondylosis of lumbar spine (Multilevel); Lumbar lateral recess stenosis (Left: L2-3, L3-4) (Right: L2-3, L4-5); Lumbar foraminal stenosis (Left: L2-3, L3-4, L4-5) (Right: L3-4, L4-5); Dextroscoliosis of lumbar spine (apex at L3-4); Grade 1 Anterolisthesis of lumbar spine (L4/L5) (5 mm); Osteoarthritis of hips (Bilateral) (L>R); Tendinopathy of gluteus medius (Right); Proximal hamstring tendon low-grade tear, sequela (Right); Superior labral tear (low-grade, partial-thickness) of hip joint, sequela (Right); Spondylosis without myelopathy or radiculopathy, lumbosacral region; Chronic musculoskeletal pain; and Other spondylosis, sacral and sacrococcygeal region on their pertinent problem list. Pain Assessment: Severity of   is reported as a  /10. Location:    / . Onset:  . Quality:  . Timing:  . Modifying factor(s):  Marland Kitchen Vitals:  vitals were not taken for this visit.  BMI: Estimated body mass index is 25.02 kg/m as calculated from the following:   Height as of 04/05/22: 5\' 6"  (1.676 m).   Weight as of 04/05/22: 155 lb (70.3 kg). Last encounter: 03/09/2022. Last procedure: 04/05/2022.  Reason for encounter:  *** . ***  Pharmacotherapy Assessment  Analgesic: No chronic opioid analgesics therapy prescribed by our practice. None. (05/31/2021) Abnormal UDS (+) Carboxy-THC MME/day: 0 mg/day   Monitoring: [place] PMP: PDMP reviewed during this encounter.       Pharmacotherapy: No side-effects or adverse reactions reported. Compliance: No problems identified. Effectiveness: Clinically acceptable.  No notes on file  CBD:THC Ratio  Date Value Ref Range Status  05/31/2021 Comment RATIO Final    Comment:    INTERFERENCE Unable to complete testing due  to unknown interference.  This test measures Cannabidiol (CBD) and Tetrahydrocannabinol (THC) and metabolites in urine. The  CBD:THC ratio is calculated using the sums of the respective metabolites and is intended to assist in differentiating the presence of Tetrahydrocannabinol [facility]) metabolites due to the use of marijuana or medicinal THC from the presence of THC metabolites due to use of Cannabidiol (CBD) or hemp products that purportedly contain trace amounts of THC.  CBD:THC Ratio          Interpretation ---------      --------------------------------------------- >=10.0         Consistent with the use of CBD products only 1.0 - 9.9      Indeterminate <1.0           Consistent with marijuana, medicinal THC or                mixed use  Interpretive ranges are provided as guidance and should not be considered definitive. Interpretation of results should include consideration of all relevant clinical and diagnostic information.  Analysis performed by chromatography with mass spectrometry. This test was developed and its performance characteristics determined by [facility].  It has not been cleared or approved by the Food and Drug Administration.    Carboxy-Delta-8-THC  Date Value Ref Range Status  05/31/2021 >500 ng/mL Final   Carboxy-Delta-9-THC  Date Value Ref Range Status  05/31/2021 117.4 ng/mL Final    Comment:    Carboxy-Delta-9-THC is the primary metabolite of Delta-9- Tetrahydrocannabinol. Sources include the prescription medication Dronabinol as well as illicit, recreational, and medical marijuana and marijuana derived products of the same categories.  Carboxy-Delta-8-THC is the primary metabolite of Delta-8- Tetrahydrocannabinol. Sources are products containing Delta- 8-THC, which is primarily chemically manufactured from cannabidiol (CBD).  Testing Threshold = 2.0 ng/mL  Analysis performed by Liquid Chromatography with Tandem Mass Spectrometry  (LC/MS/MS).  This test was developed and its performance characteristics determined by [facility].  It has not been cleared or approved by the Food and Drug Administration.     UDS:  Summary  Date Value Ref Range Status  05/31/2021 Note  Final    Comment:    ==================================================================== Compliance Drug Analysis, Ur ==================================================================== Test                             Result       Flag       Units  Drug Present and Declared for Prescription Verification   Acetaminophen                  PRESENT      EXPECTED   Ibuprofen                      PRESENT      EXPECTED  Drug Present not Declared for Prescription Verification   Carboxy-THC                    997          UNEXPECTED ng/mg creat    Carboxy-THC is a metabolite of tetrahydrocannabinol (THC). Source of    THC is most commonly herbal marijuana or marijuana-based products,    but THC is also present in a scheduled prescription medication.    Trace amounts of THC can be present in hemp and cannabidiol (CBD)    products. This test is not intended to distinguish between delta-9-    tetrahydrocannabinol, the predominant form of THC in most herbal or    marijuana-based products, and delta-8-tetrahydrocannabinol.  Drug Absent but  Declared for Prescription Verification   Methocarbamol                  Not Detected UNEXPECTED   Salicylate                     Not Detected UNEXPECTED    Aspirin, as indicated in the declared medication list, is not always    detected even when used as directed.  ==================================================================== Test                      Result    Flag   Units      Ref Range   Creatinine              75               mg/dL      >=20 ==================================================================== Declared Medications:  The flagging and interpretation on this report are based on the  following  declared medications.  Unexpected results may arise from  inaccuracies in the declared medications.   **Note: The testing scope of this panel includes these medications:   Methocarbamol (Robaxin)   **Note: The testing scope of this panel does not include small to  moderate amounts of these reported medications:   Acetaminophen (Tylenol)  Aspirin  Ibuprofen (Advil)   **Note: The testing scope of this panel does not include the  following reported medications:   Calcium (Tums)  Multivitamin  Umeclidinium (Anoro)  Vilanterol (Anoro)  Vitamin D2 (Drisdol) ==================================================================== For clinical consultation, please call [phone]. ====================================================================       ROS  Constitutional: Denies any fever or chills Gastrointestinal: No reported hemesis, hematochezia, vomiting, or acute GI distress Musculoskeletal: Denies any acute onset joint swelling, redness, loss of ROM, or weakness Neurological: No reported episodes of acute onset apraxia, aphasia, dysarthria, agnosia, amnesia, paralysis, loss of coordination, or loss of consciousness  Medication Review  Multi-Vitamin, NON FORMULARY, Vitamin D (Ergocalciferol), atorvastatin, escitalopram, ibuprofen, methocarbamol, and umeclidinium-vilanterol  History Review  Allergy: Samantha Cox is allergic to crestor [rosuvastatin], simvastatin, and augmentin [amoxicillin-pot clavulanate]. Drug: Samantha Cox  reports no history of drug use. Alcohol:  reports current alcohol use of about 1.0 standard drink of alcohol per week. Tobacco:  reports that she quit smoking about 9 years ago. Her smoking use included e-cigarettes and cigarettes. She has never used smokeless tobacco. Social: Samantha Cox  reports that she quit smoking about 9 years ago. Her smoking use included e-cigarettes and cigarettes. She has never used smokeless tobacco. She reports current  alcohol use of about 1.0 standard drink of alcohol per week. She reports that she does not use drugs. Medical:  has a past medical history of History of tobacco use, Routine general medical examination at a health care facility, and Screening for lipoid disorders. Surgical: Samantha Cox  has a past surgical history that includes Other surgical history. Family: family history includes Asthma in her mother; Atrial fibrillation in her father; Bladder Cancer in her father; CVA (age of onset: 75) in her father; Diverticulitis in her brother; Hypertension in her mother; Lung cancer in her brother.  Laboratory Chemistry Profile   Renal Lab Results  Component Value Date   BUN 25 (H) 05/31/2021   CREATININE 0.68 05/31/2021   GFR 84.17 09/20/2016   GFRAA >60 01/16/2019   GFRNONAA >60 05/31/2021    Hepatic Lab Results  Component Value Date   AST 28 05/31/2021   ALT 29 05/31/2021  ALBUMIN 3.8 05/31/2021   ALKPHOS 79 05/31/2021    Electrolytes Lab Results  Component Value Date   NA 139 05/31/2021   K 4.2 05/31/2021   CL 107 05/31/2021   CALCIUM 9.4 05/31/2021   MG 2.3 05/31/2021    Bone Lab Results  Component Value Date   VD25OH 32.18 09/20/2016   25OHVITD1 69 05/31/2021   25OHVITD2 36 05/31/2021   25OHVITD3 33 05/31/2021    Inflammation (CRP: Acute Phase) (ESR: Chronic Phase) Lab Results  Component Value Date   CRP 0.7 05/31/2021   ESRSEDRATE 5 05/31/2021         Note: Above Lab results reviewed.  Recent Imaging Review  DG PAIN CLINIC C-ARM 1-60 MIN NO REPORT Fluoro was used, but no Radiologist interpretation will be provided.  Please refer to "NOTES" tab for provider progress note. Note: Reviewed        Physical Exam  General appearance: Well nourished, well developed, and well hydrated. In no apparent acute distress Mental status: Alert, oriented x 3 (person, place, & time)       Respiratory: No evidence of acute respiratory distress Eyes: PERLA Vitals: There were  no vitals taken for this visit. BMI: Estimated body mass index is 25.02 kg/m as calculated from the following:   Height as of 04/05/22: 5\' 6"  (1.676 m).   Weight as of 04/05/22: 155 lb (70.3 kg). Ideal: Patient weight not recorded  Assessment   Diagnosis Status  No diagnosis found. Controlled Controlled Controlled   Updated Problems: No problems updated.  Plan of Care  Problem-specific:  No problem-specific Assessment & Plan notes found for this encounter.  Samantha Cox has a current medication list which includes the following long-term medication(s): atorvastatin, escitalopram, and methocarbamol.  Pharmacotherapy (Medications Ordered): No orders of the defined types were placed in this encounter.  Orders:  No orders of the defined types were placed in this encounter.  Follow-up plan:   No follow-ups on file.      Interventional Therapies  Risk  Complexity Considerations:   (05/31/2021) Abnormal UDS (+) Carboxy-THC ([place])  POOR RFA Candidate. Non-compliant w/ instructions not to move.    Planned  Pending:   Therapeutic bilateral lumbar facet MBB (L4-5, L5-S1) #3    Under consideration:   Therapeutic bilateral lumbar facet MBB #3  NO RFA - unable to stay still    Completed:  (Non-compliant: Constant movement)  Diagnostic bilateral lumbar facet MBB (L2-S1) x2 (09/21/2021) (100/100/90/90) (100% LEP relief)  Diagnostic left SI joint Blk x1 (07/27/2021) (100/100/90/90) (100% LEP relief)    Completed by other providers:   ([facility])  [place] (Ultrasound-guided) right greater trochanteric inj. x1 by Dr. Gregor Hams (02/04/2020)  [place] right greater trochanteric bursa inj. x3 by Dr. Hulan Saas (11/25/2014; 07/10/2018; 10/19/2018)  [place] right SI joint inj. x1 by Dr. Gregor Hams (06/05/2019)  [place] left gluteus medius insertion greater trochanteric inj. x1 by Dr. Gregor Hams (01/29/2019)  [place] left piriformis tendon sheath inj. x3 by Dr. Hulan Saas  (12/02/2016; 08/23/2017; 02/15/2018)  [place] left foot neuroma inj. x1 (2 neuromas between 3rd & 4th, and 4th & 5th toes) by Dr. Hulan Saas (07/07/2016)  [place] right hip joint inj. x1 by Dr. Hulan Saas (12/04/2014)  Diagnostic bilateral L5 TFESI x1 (03/12/2021) by Girtha Hake, MD (PMR) [facility])  Diagnostic left L5 dorsal ramus and S1, S2, and S3 lateral branch Blk x1, by Charlett Blake, MD (09/10/2019)  Therapeutic left L5 dorsal ramus and  S1, S2, and S3 lateral branch RFA x2, by Charlett Blake, MD (10/04/2019 & 04/10/2020) (1st provide her with 50% relief of the pain.  2nd made things worse)   Therapeutic  Palliative (PRN) options:   Therapeutic/palliative lumbar facet MBB          Recent Visits Date Type Provider Dept  04/05/22 Procedure visit Milinda Pointer, MD [facility]  03/09/22 Office Visit Milinda Pointer, MD [facility]  02/21/22 Office Visit Milinda Pointer, MD [facility]  Showing recent visits within past 90 days and meeting all other requirements Future Appointments Date Type Provider Dept  04/25/22 Appointment Milinda Pointer, MD [facility]  Showing future appointments within next 90 days and meeting all other requirements  I discussed the assessment and treatment plan with the patient. The patient was provided an opportunity to ask questions and all were answered. The patient agreed with the plan and demonstrated an understanding of the instructions.  Patient advised to call back or seek an in-person evaluation if the symptoms or condition worsens.  Duration of encounter: *** minutes.  Total time on encounter, as per AMA guidelines included both the face-to-face and non-face-to-face time personally spent by the physician and/or other qualified health care professional(s) on the day of the encounter (includes time in activities that require the physician or other qualified health care professional and does not  include time in activities normally performed by clinical staff). Physician's time may include the following activities when performed: Preparing to see the patient (e.g., pre-charting review of records, searching for previously ordered imaging, lab work, and nerve conduction tests) Review of prior analgesic pharmacotherapies. Reviewing PMP Interpreting ordered tests (e.g., lab work, imaging, nerve conduction tests) Performing post-procedure evaluations, including interpretation of diagnostic procedures Obtaining and/or reviewing separately obtained history Performing a medically appropriate examination and/or evaluation Counseling and educating the patient/family/caregiver Ordering medications, tests, or procedures Referring and communicating with other health care professionals (when not separately reported) Documenting clinical information in the electronic or other health record Independently interpreting results (not separately reported) and communicating results to the patient/ family/caregiver Care coordination (not separately reported)  Note by: Gaspar Cola, MD Date: 04/25/2022; Time: 9:35 AM

## 2022-04-25 ENCOUNTER — Ambulatory Visit: Payer: BC Managed Care – PPO | Attending: Pain Medicine | Admitting: Pain Medicine

## 2022-04-25 ENCOUNTER — Encounter: Payer: Self-pay | Admitting: Pain Medicine

## 2022-04-25 VITALS — BP 123/77 | HR 62 | Temp 97.2°F | Resp 16 | Ht 66.0 in | Wt 154.0 lb

## 2022-04-25 DIAGNOSIS — G8929 Other chronic pain: Secondary | ICD-10-CM | POA: Diagnosis not present

## 2022-04-25 DIAGNOSIS — M47816 Spondylosis without myelopathy or radiculopathy, lumbar region: Secondary | ICD-10-CM | POA: Diagnosis not present

## 2022-04-25 DIAGNOSIS — M545 Low back pain, unspecified: Secondary | ICD-10-CM | POA: Insufficient documentation

## 2022-04-25 DIAGNOSIS — M4316 Spondylolisthesis, lumbar region: Secondary | ICD-10-CM | POA: Diagnosis not present

## 2022-04-25 NOTE — Patient Instructions (Addendum)
Pain Management Discharge Instructions  General Discharge Instructions :  If you need to reach your doctor call: Monday-Friday 8:00 am - 4:00 pm at [phone] or toll free [phone].  After clinic hours [phone] to have operator reach doctor.  Bring all of your medication bottles to all your appointments in the pain clinic.  To cancel or reschedule your appointment with Pain Management please remember to call 24 hours in advance to avoid a fee.  Refer to the educational materials which you have been given on: General Risks, I had my Procedure. Discharge Instructions, Post Sedation.  Post Procedure Instructions:  The drugs you were given will stay in your system until tomorrow, so for the next 24 hours you should not drive, make any legal decisions or drink any alcoholic beverages.  You may eat anything you prefer, but it is better to start with liquids then soups and crackers, and gradually work up to solid foods.  Please notify your doctor immediately if you have any unusual bleeding, trouble breathing or pain that is not related to your normal pain.  Depending on the type of procedure that was done, some parts of your body may feel week and/or numb.  This usually clears up by tonight or the next day.  Walk with the use of an assistive device or accompanied by an adult for the 24 hours.  You may use ice on the affected area for the first 24 hours.  Put ice in a Ziploc bag and cover with a towel and place against area 15 minutes on 15 minutes off.  You may switch to heat after 24 hours. ______________________________________________________________________  Procedure instructions  Do not eat or drink fluids (other than water) for 6 hours before your procedure  No water for 2 hours before your procedure  Take your blood pressure medicine with a sip of water  Arrive 30 minutes before your appointment  Carefully read the "Preparing for your procedure" detailed  instructions  If you have questions call us at [phone]  _____________________________________________________________________    ______________________________________________________________________  Preparing for your procedure  Appointments: If you think you may not be able to keep your appointment, call 24-48 hours in advance to cancel. We need time to make it available to others.  During your procedure appointment there will be: No Prescription Refills. No disability issues to discussed. No medication changes or discussions.  Instructions: Food intake: Avoid eating anything solid for at least 8 hours prior to your procedure. Clear liquid intake: You may take clear liquids such as water up to 2 hours prior to your procedure. (No carbonated drinks. No soda.) Transportation: Unless otherwise stated by your physician, bring a driver. Morning Medicines: Except for blood thinners, take all of your other morning medications with a sip of water. Make sure to take your heart and blood pressure medicines. If your blood pressure's lower number is above 100, the case will be rescheduled. Blood thinners: Make sure to stop your blood thinners as instructed.  If you take a blood thinner, but were not instructed to stop it, call our office [phone] and ask to talk to a nurse. Not stopping a blood thinner prior to certain procedures could lead to serious complications. Diabetics on insulin: Notify the staff so that you can be scheduled 1st case in the morning. If your diabetes requires high dose insulin, take only  of your normal insulin dose the morning of the procedure and notify the staff that you have done so. Preventing  infections: Shower with an antibacterial soap the morning of your procedure.  Build-up your immune system: Take 1000 mg of Vitamin C with every meal (3 times a day) the day prior to your procedure. Antibiotics: Inform the nursing staff if you are taking any  antibiotics or if you have any conditions that may require antibiotics prior to procedures. (Example: recent joint implants)   Pregnancy: If you are pregnant make sure to notify the nursing staff. Not doing so may result in injury to the fetus, including death.  Sickness: If you have a cold, fever, or any active infections, call and cancel or reschedule your procedure. Receiving steroids while having an infection may result in complications. Arrival: You must be in the facility at least 30 minutes prior to your scheduled procedure. Tardiness: Your scheduled time is also the cutoff time. If you do not arrive at least 15 minutes prior to your procedure, you will be rescheduled.  Children: Do not bring any children with you. Make arrangements to keep them home. Dress appropriately: There is always a possibility that your clothing may get soiled. Avoid long dresses. Valuables: Do not bring any jewelry or valuables.  Reasons to call and reschedule or cancel your procedure: (Following these recommendations will minimize the risk of a serious complication.) Surgeries: Avoid having procedures within 2 weeks of any surgery. (Avoid for 2 weeks before or after any surgery). Flu Shots: Avoid having procedures within 2 weeks of a flu shots or . (Avoid for 2 weeks before or after immunizations). Barium: Avoid having a procedure within 7-10 days after having had a radiological study involving the use of radiological contrast. (Myelograms, Barium swallow or enema study). Heart attacks: Avoid any elective procedures or surgeries for the initial 6 months after a "Myocardial Infarction" (Heart Attack). Blood thinners: It is imperative that you stop these medications before procedures. Let us know if you if you take any blood thinner.  Infection: Avoid procedures during or within two weeks of an infection (including chest colds or gastrointestinal problems). Symptoms associated with infections include: Localized redness,  fever, chills, night sweats or profuse sweating, burning sensation when voiding, cough, congestion, stuffiness, runny nose, sore throat, diarrhea, nausea, vomiting, cold or Flu symptoms, recent or current infections. It is specially important if the infection is over the area that we intend to treat. Heart and lung problems: Symptoms that may suggest an active cardiopulmonary problem include: cough, chest pain, breathing difficulties or shortness of breath, dizziness, ankle swelling, uncontrolled high or unusually low blood pressure, and/or palpitations. If you are experiencing any of these symptoms, cancel your procedure and contact your primary care physician for an evaluation.  Remember:  Regular Business hours are:  Monday to Thursday 8:00 AM to 4:00 PM  Provider's Schedule: Milinda Pointer, MD:  Procedure days: Tuesday and Thursday 7:30 AM to 4:00 PM  Gillis Santa, MD:  Procedure days: Monday and Wednesday 7:30 AM to 4:00 PM  ______________________________________________________________________    ____________________________________________________________________________________________  General Risks and Possible Complications  Patient Responsibilities: It is important that you read this as it is part of your informed consent. It is our duty to inform you of the risks and possible complications associated with treatments offered to you. It is your responsibility as a patient to read this and to ask questions about anything that is not clear or that you believe was not covered in this document.  Patient's Rights: You have the right to refuse treatment. You also have the right to change your  mind, even after initially having agreed to have the treatment done. However, under this last option, if you wait until the last second to change your mind, you may be charged for the materials used up to that point.  Introduction: Medicine is not an Chief Strategy Officer. Everything in Medicine,  including the lack of treatment(s), carries the potential for danger, harm, or loss (which is by definition: Risk). In Medicine, a complication is a secondary problem, condition, or disease that can aggravate an already existing one. All treatments carry the risk of possible complications. The fact that a side effects or complications occurs, does not imply that the treatment was conducted incorrectly. It must be clearly understood that these can happen even when everything is done following the highest safety standards.  No treatment: You can choose not to proceed with the proposed treatment alternative. The "PRO(s)" would include: avoiding the risk of complications associated with the therapy. The "CON(s)" would include: not getting any of the treatment benefits. These benefits fall under one of three categories: diagnostic; therapeutic; and/or palliative. Diagnostic benefits include: getting information which can ultimately lead to improvement of the disease or symptom(s). Therapeutic benefits are those associated with the successful treatment of the disease. Finally, palliative benefits are those related to the decrease of the primary symptoms, without necessarily curing the condition (example: decreasing the pain from a flare-up of a chronic condition, such as incurable terminal cancer).  General Risks and Complications: These are associated to most interventional treatments. They can occur alone, or in combination. They fall under one of the following six (6) categories: no benefit or worsening of symptoms; bleeding; infection; nerve damage; allergic reactions; and/or death. No benefits or worsening of symptoms: In Medicine there are no guarantees, only probabilities. No healthcare provider can ever guarantee that a medical treatment will work, they can only state the probability that it may. Furthermore, there is always the possibility that the condition may worsen, either directly, or indirectly, as a  consequence of the treatment. Bleeding: This is more common if the patient is taking a blood thinner, either prescription or over the counter (example: Goody Powders, Fish oil, Aspirin, Garlic, etc.), or if suffering a condition associated with impaired coagulation (example: Hemophilia, cirrhosis of the liver, low platelet counts, etc.). However, even if you do not have one on these, it can still happen. If you have any of these conditions, or take one of these drugs, make sure to notify your treating physician. Infection: This is more common in patients with a compromised immune system, either due to disease (example: diabetes, cancer, human immunodeficiency virus [HIV], etc.), or due to medications or treatments (example: therapies used to treat cancer and rheumatological diseases). However, even if you do not have one on these, it can still happen. If you have any of these conditions, or take one of these drugs, make sure to notify your treating physician. Nerve Damage: This is more common when the treatment is an invasive one, but it can also happen with the use of medications, such as those used in the treatment of cancer. The damage can occur to small secondary nerves, or to large primary ones, such as those in the spinal cord and brain. This damage may be temporary or permanent and it may lead to impairments that can range from temporary numbness to permanent paralysis and/or brain death. Allergic Reactions: Any time a substance or material comes in contact with our body, there is the possibility of an allergic reaction. These can  range from a mild skin rash (contact dermatitis) to a severe systemic reaction (anaphylactic reaction), which can result in death. Death: In general, any medical intervention can result in death, most of the time due to an unforeseen complication. ____________________________________________________________________________________________

## 2022-05-12 ENCOUNTER — Other Ambulatory Visit: Payer: Self-pay | Admitting: *Deleted

## 2022-05-12 ENCOUNTER — Telehealth: Payer: Self-pay | Admitting: Pain Medicine

## 2022-05-12 ENCOUNTER — Other Ambulatory Visit: Payer: Self-pay | Admitting: Pain Medicine

## 2022-05-12 NOTE — Telephone Encounter (Signed)
Pharmacy called stated that they were trying to refilled patient Methocarbamol 500mg . Please give pharmacy a call. TY

## 2022-05-12 NOTE — Telephone Encounter (Signed)
Refill medication request sent.  

## 2022-05-12 NOTE — Telephone Encounter (Signed)
Called patient to let her know that [facility] refused refill request.  Informed that she will need to contact her PCP .  Patient verbalizes u/o information .

## 2022-05-17 DIAGNOSIS — E7849 Other hyperlipidemia: Secondary | ICD-10-CM | POA: Diagnosis not present

## 2022-05-17 DIAGNOSIS — Z789 Other specified health status: Secondary | ICD-10-CM | POA: Diagnosis not present

## 2022-05-17 DIAGNOSIS — M545 Low back pain, unspecified: Secondary | ICD-10-CM | POA: Diagnosis not present

## 2022-05-17 DIAGNOSIS — J449 Chronic obstructive pulmonary disease, unspecified: Secondary | ICD-10-CM | POA: Diagnosis not present

## 2022-05-23 ENCOUNTER — Encounter: Payer: Self-pay | Admitting: *Deleted

## 2022-05-23 DIAGNOSIS — J438 Other emphysema: Secondary | ICD-10-CM | POA: Diagnosis not present

## 2022-05-23 DIAGNOSIS — N2889 Other specified disorders of kidney and ureter: Secondary | ICD-10-CM | POA: Diagnosis not present

## 2022-05-24 ENCOUNTER — Other Ambulatory Visit: Payer: Self-pay | Admitting: Infectious Diseases

## 2022-05-24 ENCOUNTER — Other Ambulatory Visit: Payer: Self-pay | Admitting: Pulmonary Disease

## 2022-05-24 DIAGNOSIS — Z1231 Encounter for screening mammogram for malignant neoplasm of breast: Secondary | ICD-10-CM

## 2022-05-24 DIAGNOSIS — N2889 Other specified disorders of kidney and ureter: Secondary | ICD-10-CM

## 2022-05-24 DIAGNOSIS — J449 Chronic obstructive pulmonary disease, unspecified: Secondary | ICD-10-CM | POA: Diagnosis not present

## 2022-05-24 DIAGNOSIS — E7849 Other hyperlipidemia: Secondary | ICD-10-CM | POA: Diagnosis not present

## 2022-05-24 DIAGNOSIS — M545 Low back pain, unspecified: Secondary | ICD-10-CM | POA: Diagnosis not present

## 2022-05-24 DIAGNOSIS — Z0000 Encounter for general adult medical examination without abnormal findings: Secondary | ICD-10-CM | POA: Diagnosis not present

## 2022-05-24 DIAGNOSIS — Z789 Other specified health status: Secondary | ICD-10-CM | POA: Diagnosis not present

## 2022-06-09 ENCOUNTER — Other Ambulatory Visit: Payer: BC Managed Care – PPO

## 2022-06-11 ENCOUNTER — Ambulatory Visit
Admission: RE | Admit: 2022-06-11 | Discharge: 2022-06-11 | Disposition: A | Discharge status: Home / Self Care | Payer: BC Managed Care – PPO | Source: Ambulatory Visit | Attending: Pulmonary Disease | Admitting: Pulmonary Disease

## 2022-06-11 DIAGNOSIS — N2889 Other specified disorders of kidney and ureter: Secondary | ICD-10-CM

## 2022-06-11 DIAGNOSIS — N289 Disorder of kidney and ureter, unspecified: Secondary | ICD-10-CM | POA: Diagnosis not present

## 2022-06-11 MED ORDER — GADOPICLENOL 0.5 MMOL/ML IV SOLN
7.5000 mL | Freq: Once | INTRAVENOUS | Status: AC | PRN
  Administered 2022-06-11: 7.5 mL via INTRAVENOUS

## 2022-06-14 NOTE — Progress Notes (Unsigned)
PROVIDER NOTE: Information contained herein reflects review and annotations entered in association with encounter. Interpretation of such information and data should be left to medically-trained personnel. Information provided to patient can be located elsewhere in the medical record under "Patient Instructions". Document created using STT-dictation technology, any transcriptional errors that may result from process are unintentional.    Patient: Samantha Cox  Service Category: E/M  Provider: Oswaldo Done, MD  DOB: 1955-06-09  DOS: 06/15/2022  Referring Provider: Mick Sell, MD  MRN: [id number]  Specialty: Interventional Pain Management  PCP: Mick Sell, MD  Type: Established Patient  Setting: Ambulatory outpatient    Location: Office  Delivery: Face-to-face     HPI  [name], a 67 y.o. year old female, is here today because of her Chronic bilateral low back pain without sciatica [M54.50, G89.29]. Samantha Cox primary complain today is No chief complaint on file.  Pertinent problems: Samantha Cox has Degenerative arthritis of hip; Greater trochanteric bursitis of hip (Left); Piriformis syndrome of left side; Morton's neuroma of foot (Left); Leg cramping; Degenerative lumbar spinal stenosis; Greater trochanteric bursitis of hip (Right); Facet arthritis of lumbar region; Chronic SI joint pain (Left); Hamstring tendinitis at origin; Myalgia due to statin; Cervical intraepithelial neoplasia grade 1; Chronic back pain; Degeneration of lumbar intervertebral disc; Enthesopathy of hip region (Right); Metatarsalgia of foot (Left); Pain in foot (Left); Plantar fasciitis of foot (Left); Chronic pain syndrome; Abnormal MRI, lumbar spine (07/28/2019); Cervicalgia (4th area of Pain); Chronic low back pain (1ry area of Pain) (Bilateral) (R>L) w/o sciatica; Lumbar facet syndrome (Bilateral); Lumbosacral facet hypertrophy (Multilevel) (Bilateral); Chronic lower extremity pain  (3ry area of Pain) (nonradicular) (Bilateral) (L>R); Cervicogenic headache (6th area of Pain); Chronic shoulder pain (5th area of Pain) (Bilateral); Chronic hip pain (2ry area of Pain) (Bilateral) (R>L); DDD (degenerative disc disease), lumbar; Osteoarthritis of acromioclavicular joints (Bilateral); Spondylosis of lumbar spine (Multilevel); Lumbar lateral recess stenosis (Left: L2-3, L3-4) (Right: L2-3, L4-5); Lumbar foraminal stenosis (Left: L2-3, L3-4, L4-5) (Right: L3-4, L4-5); Dextroscoliosis of lumbar spine (apex at L3-4); Grade 1 Anterolisthesis of lumbar spine (L4/L5) (5 mm); Osteoarthritis of hips (Bilateral) (L>R); Tendinopathy of gluteus medius (Right); Proximal hamstring tendon low-grade tear, sequela (Right); Superior labral tear (low-grade, partial-thickness) of hip joint, sequela (Right); Spondylosis without myelopathy or radiculopathy, lumbosacral region; Chronic musculoskeletal pain; and Other spondylosis, sacral and sacrococcygeal region on their pertinent problem list. Pain Assessment: Severity of   is reported as a  /10. Location:    / . Onset:  . Quality:  . Timing:  . Modifying factor(s):  Marland Kitchen Vitals:  vitals were not taken for this visit.  BMI: Estimated body mass index is 24.86 kg/m as calculated from the following:   Height as of 04/25/22: 5\' 6"  (1.676 m).   Weight as of 04/25/22: 154 lb (69.9 kg). Last encounter: 04/25/2022. Last procedure: 04/05/2022.  Reason for encounter: evaluation of worsening, or previously known (established) problem. ***  LBP & Hip pain  Pharmacotherapy Assessment  Analgesic: No chronic opioid analgesics therapy prescribed by our practice. None. (05/31/2021) Abnormal UDS (+) Carboxy-THC MME/day: 0 mg/day   Monitoring: [place] PMP: PDMP reviewed during this encounter.       Pharmacotherapy: No side-effects or adverse reactions reported. Compliance: No problems identified. Effectiveness: Clinically acceptable.  No notes on file  CBD:THC Ratio  Date Value  Ref Range Status  05/31/2021 Comment RATIO Final    Comment:    INTERFERENCE Unable to complete testing due to unknown  interference.  This test measures Cannabidiol (CBD) and Tetrahydrocannabinol (THC) and metabolites in urine. The CBD:THC ratio is calculated using the sums of the respective metabolites and is intended to assist in differentiating the presence of Tetrahydrocannabinol [facility]) metabolites due to the use of marijuana or medicinal THC from the presence of THC metabolites due to use of Cannabidiol (CBD) or hemp products that purportedly contain trace amounts of THC.  CBD:THC Ratio          Interpretation ---------      --------------------------------------------- >=10.0         Consistent with the use of CBD products only 1.0 - 9.9      Indeterminate <1.0           Consistent with marijuana, medicinal THC or                mixed use  Interpretive ranges are provided as guidance and should not be considered definitive. Interpretation of results should include consideration of all relevant clinical and diagnostic information.  Analysis performed by chromatography with mass spectrometry. This test was developed and its performance characteristics determined by [facility].  It has not been cleared or approved by the Food and Drug Administration.    Carboxy-Delta-8-THC  Date Value Ref Range Status  05/31/2021 >500 ng/mL Final   Carboxy-Delta-9-THC  Date Value Ref Range Status  05/31/2021 117.4 ng/mL Final    Comment:    Carboxy-Delta-9-THC is the primary metabolite of Delta-9- Tetrahydrocannabinol. Sources include the prescription medication Dronabinol as well as illicit, recreational, and medical marijuana and marijuana derived products of the same categories.  Carboxy-Delta-8-THC is the primary metabolite of Delta-8- Tetrahydrocannabinol. Sources are products containing Delta- 8-THC, which is primarily chemically manufactured from cannabidiol (CBD).  Testing  Threshold = 2.0 ng/mL  Analysis performed by Liquid Chromatography with Tandem Mass Spectrometry (LC/MS/MS).  This test was developed and its performance characteristics determined by [facility].  It has not been cleared or approved by the Food and Drug Administration.     UDS:  Summary  Date Value Ref Range Status  05/31/2021 Note  Final    Comment:    ==================================================================== Compliance Drug Analysis, Ur ==================================================================== Test                             Result       Flag       Units  Drug Present and Declared for Prescription Verification   Acetaminophen                  PRESENT      EXPECTED   Ibuprofen                      PRESENT      EXPECTED  Drug Present not Declared for Prescription Verification   Carboxy-THC                    997          UNEXPECTED ng/mg creat    Carboxy-THC is a metabolite of tetrahydrocannabinol (THC). Source of    THC is most commonly herbal marijuana or marijuana-based products,    but THC is also present in a scheduled prescription medication.    Trace amounts of THC can be present in hemp and cannabidiol (CBD)    products. This test is not intended to distinguish between delta-9-    tetrahydrocannabinol, the predominant form of THC  in most herbal or    marijuana-based products, and delta-8-tetrahydrocannabinol.  Drug Absent but Declared for Prescription Verification   Methocarbamol                  Not Detected UNEXPECTED   Salicylate                     Not Detected UNEXPECTED    Aspirin, as indicated in the declared medication list, is not always    detected even when used as directed.  ==================================================================== Test                      Result    Flag   Units      Ref Range   Creatinine              75               mg/dL       >=16 ==================================================================== Declared Medications:  The flagging and interpretation on this report are based on the  following declared medications.  Unexpected results may arise from  inaccuracies in the declared medications.   **Note: The testing scope of this panel includes these medications:   Methocarbamol (Robaxin)   **Note: The testing scope of this panel does not include small to  moderate amounts of these reported medications:   Acetaminophen (Tylenol)  Aspirin  Ibuprofen (Advil)   **Note: The testing scope of this panel does not include the  following reported medications:   Calcium (Tums)  Multivitamin  Umeclidinium (Anoro)  Vilanterol (Anoro)  Vitamin D2 (Drisdol) ==================================================================== For clinical consultation, please call [phone]. ====================================================================       ROS  Constitutional: Denies any fever or chills Gastrointestinal: No reported hemesis, hematochezia, vomiting, or acute GI distress Musculoskeletal: Denies any acute onset joint swelling, redness, loss of ROM, or weakness Neurological: No reported episodes of acute onset apraxia, aphasia, dysarthria, agnosia, amnesia, paralysis, loss of coordination, or loss of consciousness  Medication Review  Multi-Vitamin, NON FORMULARY, Vitamin D (Ergocalciferol), atorvastatin, escitalopram, ibuprofen, methocarbamol, and umeclidinium-vilanterol  History Review  Allergy: Samantha Cox is allergic to crestor [rosuvastatin], simvastatin, and augmentin [amoxicillin-pot clavulanate]. Drug: Samantha Cox  reports no history of drug use. Alcohol:  reports current alcohol use of about 1.0 standard drink of alcohol per week. Tobacco:  reports that she has been smoking e-cigarettes. She uses smokeless tobacco. Social: Samantha Cox  reports that she has been smoking e-cigarettes.  She uses smokeless tobacco. She reports current alcohol use of about 1.0 standard drink of alcohol per week. She reports that she does not use drugs. Medical:  has a past medical history of History of tobacco use, Routine general medical examination at a health care facility, and Screening for lipoid disorders. Surgical: Samantha Cox  has a past surgical history that includes Other surgical history. Family: family history includes Asthma in her mother; Atrial fibrillation in her father; Bladder Cancer in her father; CVA (age of onset: 40) in her father; Diverticulitis in her brother; Hypertension in her mother; Lung cancer in her brother.  Laboratory Chemistry Profile   Renal Lab Results  Component Value Date   BUN 25 (H) 05/31/2021   CREATININE 0.68 05/31/2021   GFR 84.17 09/20/2016   GFRAA >60 01/16/2019   GFRNONAA >60 05/31/2021    Hepatic Lab Results  Component Value Date   AST 28 05/31/2021   ALT 29 05/31/2021   ALBUMIN 3.8 05/31/2021   ALKPHOS  79 05/31/2021    Electrolytes Lab Results  Component Value Date   NA 139 05/31/2021   K 4.2 05/31/2021   CL 107 05/31/2021   CALCIUM 9.4 05/31/2021   MG 2.3 05/31/2021    Bone Lab Results  Component Value Date   VD25OH 32.18 09/20/2016   25OHVITD1 69 05/31/2021   25OHVITD2 36 05/31/2021   25OHVITD3 33 05/31/2021    Inflammation (CRP: Acute Phase) (ESR: Chronic Phase) Lab Results  Component Value Date   CRP 0.7 05/31/2021   ESRSEDRATE 5 05/31/2021         Note: Above Lab results reviewed.  Recent Imaging Review  MR ABDOMEN WWO CONTRAST CLINICAL DATA:  67 year old female history of renal lesions.  EXAM: MRI ABDOMEN WITHOUT AND WITH CONTRAST  TECHNIQUE: Multiplanar multisequence MR imaging of the abdomen was performed both before and after the administration of intravenous contrast.  CONTRAST:  7.5 mL of Vueway.  COMPARISON:  No priors.  FINDINGS: Lower chest: Unremarkable.  Hepatobiliary: 9 mm T1  hypointense, T2 hyperintense nonenhancing lesion in segment 3 of the liver, compatible with a tiny cyst or biliary hamartoma (no imaging follow-up recommended). No other suspicious appearing hepatic lesions are noted. No intra or extrahepatic biliary ductal dilatation. Gallbladder is nearly completely decompressed, but otherwise unremarkable in appearance.  Pancreas: No pancreatic mass. No pancreatic ductal dilatation. No pancreatic or peripancreatic fluid collections or inflammatory changes.  Spleen:  Unremarkable.  Adrenals/Urinary Tract: Multiple T1 hypointense, T2 hyperintense, nonenhancing lesions are noted in both kidneys compatible with simple (Bosniak class 1) cysts (no imaging follow-up recommended), largest of which is exophytic extending off the upper pole of the left kidney measuring 5 cm. No aggressive appearing renal lesions are noted. No hydroureteronephrosis in the visualized portions of the abdomen. Bilateral adrenal glands are normal in appearance.  Stomach/Bowel: Visualized portions are unremarkable.  Vascular/Lymphatic: No aneurysm identified in the visualized abdominal vasculature. No lymphadenopathy noted in the abdomen.  Other: No significant volume of ascites noted in the visualized portions of the peritoneal cavity.  Musculoskeletal: No aggressive appearing osseous lesions are noted in the visualized portions of the skeleton.  IMPRESSION: 1. Multiple simple (Bosniak class 1) cysts in the kidneys bilaterally, as above. These are considered benign and requiring no imaging follow-up.  Electronically Signed   By: Trudie Reed M.D.   On: 06/12/2022 08:16 Note: Reviewed        Physical Exam  General appearance: Well nourished, well developed, and well hydrated. In no apparent acute distress Mental status: Alert, oriented x 3 (person, place, & time)       Respiratory: No evidence of acute respiratory distress Eyes: PERLA Vitals: There were no  vitals taken for this visit. BMI: Estimated body mass index is 24.86 kg/m as calculated from the following:   Height as of 04/25/22: 5\' 6"  (1.676 m).   Weight as of 04/25/22: 154 lb (69.9 kg). Ideal: Patient weight not recorded  Assessment   Diagnosis Status  1. Chronic low back pain (1ry area of Pain) (Bilateral) (R>L) w/o sciatica   2. Chronic hip pain (2ry area of Pain) (Bilateral) (R>L)   3. Chronic lower extremity pain (3ry area of Pain) (nonradicular) (Bilateral) (L>R)   4. Abnormal MRI, lumbar spine (07/28/2019)   5. Grade 1 Anterolisthesis of lumbar spine (L4/L5) (5 mm)   6. Lumbar foraminal stenosis (Left: L2-3, L3-4, L4-5) (Right: L3-4, L4-5)   7. Lumbar lateral recess stenosis (Left: L2-3, L3-4) (Right: L2-3, L4-5)    Controlled Controlled  Controlled   Updated Problems: No problems updated.  Plan of Care  Problem-specific:  No problem-specific Assessment & Plan notes found for this encounter.  Ms. MARLIENE GIFFEN has a current medication list which includes the following long-term medication(s): atorvastatin, escitalopram, methocarbamol, and methocarbamol.  Pharmacotherapy (Medications Ordered): No orders of the defined types were placed in this encounter.  Orders:  No orders of the defined types were placed in this encounter.  Follow-up plan:   No follow-ups on file.      Interventional Therapies  Risk  Complexity Considerations:   (05/31/2021) Abnormal UDS (+) Carboxy-THC ([place])  POOR RFA Candidate. Non-compliant w/ instructions not to move.    Planned  Pending:      Under consideration:   Therapeutic bilateral lumbar facet MBB (L4-5, L5-S1) #4  NO RFA - unable to stay still    Completed:  (Non-compliant: Constant movement)  Diagnostic bilateral lumbar facet MBB (L3-S1) x3 (09/21/2021) (100/100/90/90) (100% LEP relief)  Diagnostic left SI joint Blk x1 (07/27/2021) (100/100/90/90) (100% LEP relief)    Completed by other providers:    ([facility])  [place] (Ultrasound-guided) right greater trochanteric inj. x1 by Dr. Rodolph Bong (02/04/2020)  [place] right greater trochanteric bursa inj. x3 by Dr. [name] (11/25/2014; 07/10/2018; 10/19/2018)  [place] right SI joint inj. x1 by Dr. Rodolph Bong (06/05/2019)  [place] left gluteus medius insertion greater trochanteric inj. x1 by Dr. Rodolph Bong (01/29/2019)  [place] left piriformis tendon sheath inj. x3 by Dr. [name] (12/02/2016; 08/23/2017; 02/15/2018)  [place] left foot neuroma inj. x1 (2 neuromas between 3rd & 4th, and 4th & 5th toes) by Dr. [name] (07/07/2016)  [place] right hip joint inj. x1 by Dr. [name] (12/04/2014)  Diagnostic bilateral L5 TFESI x1 (03/12/2021) by Filomena Jungling, MD (PMR) [facility])  Diagnostic left L5 dorsal ramus and S1, S2, and S3 lateral branch Blk x1, by Erick Colace, MD (09/10/2019)  Therapeutic left L5 dorsal ramus and S1, S2, and S3 lateral branch RFA x2, by Erick Colace, MD (10/04/2019 & 04/10/2020) (1st provide her with 50% relief of the pain.  2nd made things worse)   Therapeutic  Palliative (PRN) options:   Therapeutic/palliative lumbar facet MBB         Recent Visits Date Type Provider Dept  04/25/22 Office Visit Delano Metz, MD [facility]  04/05/22 Procedure visit Delano Metz, MD [facility]  Showing recent visits within past 90 days and meeting all other requirements Future Appointments Date Type Provider Dept  06/15/22 Appointment Delano Metz, MD [facility]  Showing future appointments within next 90 days and meeting all other requirements  I discussed the assessment and treatment plan with the patient. The patient was provided an opportunity to ask questions and all were answered. The patient agreed with the plan and demonstrated an understanding of the instructions.  Patient advised to call back or seek an in-person evaluation if the symptoms or condition  worsens.  Duration of encounter: *** minutes.  Total time on encounter, as per AMA guidelines included both the face-to-face and non-face-to-face time personally spent by the physician and/or other qualified health care professional(s) on the day of the encounter (includes time in activities that require the physician or other qualified health care professional and does not include time in activities normally performed by clinical staff). Physician's time may include the following activities when performed: Preparing to see the patient (e.g., pre-charting review of records, searching for previously ordered  imaging, lab work, and nerve conduction tests) Review of prior analgesic pharmacotherapies. Reviewing PMP Interpreting ordered tests (e.g., lab work, imaging, nerve conduction tests) Performing post-procedure evaluations, including interpretation of diagnostic procedures Obtaining and/or reviewing separately obtained history Performing a medically appropriate examination and/or evaluation Counseling and educating the patient/family/caregiver Ordering medications, tests, or procedures Referring and communicating with other health care professionals (when not separately reported) Documenting clinical information in the electronic or other health record Independently interpreting results (not separately reported) and communicating results to the patient/ family/caregiver Care coordination (not separately reported)  Note by: Oswaldo Done, MD Date: 06/15/2022; Time: 7:16 AM

## 2022-06-15 ENCOUNTER — Encounter: Payer: Self-pay | Admitting: Pain Medicine

## 2022-06-15 ENCOUNTER — Ambulatory Visit: Payer: BC Managed Care – PPO | Attending: Pain Medicine | Admitting: Pain Medicine

## 2022-06-15 VITALS — BP 116/81 | HR 66 | Temp 97.3°F | Resp 18 | Ht 66.0 in | Wt 154.0 lb

## 2022-06-15 DIAGNOSIS — G8929 Other chronic pain: Secondary | ICD-10-CM | POA: Diagnosis not present

## 2022-06-15 DIAGNOSIS — M7061 Trochanteric bursitis, right hip: Secondary | ICD-10-CM | POA: Diagnosis not present

## 2022-06-15 DIAGNOSIS — M5136 Other intervertebral disc degeneration, lumbar region: Secondary | ICD-10-CM | POA: Diagnosis not present

## 2022-06-15 DIAGNOSIS — M7062 Trochanteric bursitis, left hip: Secondary | ICD-10-CM | POA: Insufficient documentation

## 2022-06-15 DIAGNOSIS — M160 Bilateral primary osteoarthritis of hip: Secondary | ICD-10-CM | POA: Diagnosis not present

## 2022-06-15 DIAGNOSIS — M4316 Spondylolisthesis, lumbar region: Secondary | ICD-10-CM | POA: Diagnosis not present

## 2022-06-15 DIAGNOSIS — M79604 Pain in right leg: Secondary | ICD-10-CM | POA: Insufficient documentation

## 2022-06-15 DIAGNOSIS — M79605 Pain in left leg: Secondary | ICD-10-CM | POA: Diagnosis not present

## 2022-06-15 DIAGNOSIS — M25551 Pain in right hip: Secondary | ICD-10-CM | POA: Insufficient documentation

## 2022-06-15 DIAGNOSIS — M25552 Pain in left hip: Secondary | ICD-10-CM | POA: Insufficient documentation

## 2022-06-15 DIAGNOSIS — S73191S Other sprain of right hip, sequela: Secondary | ICD-10-CM | POA: Diagnosis not present

## 2022-06-15 DIAGNOSIS — M533 Sacrococcygeal disorders, not elsewhere classified: Secondary | ICD-10-CM | POA: Insufficient documentation

## 2022-06-15 DIAGNOSIS — M48061 Spinal stenosis, lumbar region without neurogenic claudication: Secondary | ICD-10-CM

## 2022-06-15 DIAGNOSIS — R937 Abnormal findings on diagnostic imaging of other parts of musculoskeletal system: Secondary | ICD-10-CM

## 2022-06-15 DIAGNOSIS — M545 Low back pain, unspecified: Secondary | ICD-10-CM | POA: Diagnosis not present

## 2022-06-15 NOTE — Patient Instructions (Signed)

## 2022-06-15 NOTE — Progress Notes (Signed)
Safety precautions to be maintained throughout the outpatient stay will include: orient to surroundings, keep bed in low position, maintain call bell within reach at all times, provide assistance with transfer out of bed and ambulation.  

## 2022-07-05 DIAGNOSIS — R918 Other nonspecific abnormal finding of lung field: Secondary | ICD-10-CM | POA: Diagnosis not present

## 2022-07-05 DIAGNOSIS — Z87891 Personal history of nicotine dependence: Secondary | ICD-10-CM | POA: Diagnosis not present

## 2022-07-14 ENCOUNTER — Ambulatory Visit
Admission: RE | Admit: 2022-07-14 | Discharge: 2022-07-14 | Disposition: A | Discharge status: Home / Self Care | Payer: BC Managed Care – PPO | Source: Ambulatory Visit | Attending: Infectious Diseases | Admitting: Infectious Diseases

## 2022-07-14 DIAGNOSIS — Z1231 Encounter for screening mammogram for malignant neoplasm of breast: Secondary | ICD-10-CM

## 2022-07-19 ENCOUNTER — Ambulatory Visit
Admission: RE | Admit: 2022-07-19 | Discharge: 2022-07-19 | Disposition: A | Discharge status: Home / Self Care | Payer: BC Managed Care – PPO | Source: Ambulatory Visit | Attending: Pain Medicine | Admitting: Pain Medicine

## 2022-07-19 ENCOUNTER — Encounter: Payer: Self-pay | Admitting: Pain Medicine

## 2022-07-19 ENCOUNTER — Ambulatory Visit: Payer: BC Managed Care – PPO | Attending: Pain Medicine | Admitting: Pain Medicine

## 2022-07-19 VITALS — BP 121/71 | HR 63 | Temp 97.1°F | Resp 18 | Ht 60.0 in | Wt 154.0 lb

## 2022-07-19 DIAGNOSIS — G8929 Other chronic pain: Secondary | ICD-10-CM | POA: Insufficient documentation

## 2022-07-19 DIAGNOSIS — M545 Low back pain, unspecified: Secondary | ICD-10-CM | POA: Insufficient documentation

## 2022-07-19 DIAGNOSIS — M25552 Pain in left hip: Secondary | ICD-10-CM | POA: Diagnosis not present

## 2022-07-19 DIAGNOSIS — M25551 Pain in right hip: Secondary | ICD-10-CM | POA: Insufficient documentation

## 2022-07-19 DIAGNOSIS — M9904 Segmental and somatic dysfunction of sacral region: Secondary | ICD-10-CM | POA: Insufficient documentation

## 2022-07-19 DIAGNOSIS — M533 Sacrococcygeal disorders, not elsewhere classified: Secondary | ICD-10-CM | POA: Insufficient documentation

## 2022-07-19 MED ORDER — MIDAZOLAM HCL 5 MG/5ML IJ SOLN
0.5000 mg | Freq: Once | INTRAMUSCULAR | Status: AC
  Administered 2022-07-19: 1 mg via INTRAVENOUS

## 2022-07-19 MED ORDER — FENTANYL CITRATE (PF) 100 MCG/2ML IJ SOLN
25.0000 ug | INTRAMUSCULAR | Status: DC | PRN
  Administered 2022-07-19: 50 ug via INTRAVENOUS

## 2022-07-19 MED ORDER — LIDOCAINE HCL (PF) 2 % IJ SOLN
INTRAMUSCULAR | Status: AC
  Filled 2022-07-19: qty 10

## 2022-07-19 MED ORDER — FENTANYL CITRATE (PF) 100 MCG/2ML IJ SOLN
INTRAMUSCULAR | Status: AC
  Filled 2022-07-19: qty 2

## 2022-07-19 MED ORDER — METHYLPREDNISOLONE ACETATE 80 MG/ML IJ SUSP
INTRAMUSCULAR | Status: AC
  Filled 2022-07-19: qty 1

## 2022-07-19 MED ORDER — LIDOCAINE HCL 2 % IJ SOLN
20.0000 mL | Freq: Once | INTRAMUSCULAR | Status: AC
  Administered 2022-07-19: 100 mg

## 2022-07-19 MED ORDER — ROPIVACAINE HCL 2 MG/ML IJ SOLN
9.0000 mL | Freq: Once | INTRAMUSCULAR | Status: AC
  Administered 2022-07-19: 9 mL via INTRA_ARTICULAR

## 2022-07-19 MED ORDER — MIDAZOLAM HCL 5 MG/5ML IJ SOLN
INTRAMUSCULAR | Status: AC
  Filled 2022-07-19: qty 5

## 2022-07-19 MED ORDER — LACTATED RINGERS IV SOLN: Freq: Once | INTRAVENOUS | Status: AC

## 2022-07-19 MED ORDER — PENTAFLUOROPROP-TETRAFLUOROETH EX AERO
INHALATION_SPRAY | Freq: Once | CUTANEOUS | Status: AC
  Administered 2022-07-19: 30 via TOPICAL
  Filled 2022-07-19: qty 30

## 2022-07-19 MED ORDER — METHYLPREDNISOLONE ACETATE 80 MG/ML IJ SUSP
80.0000 mg | Freq: Once | INTRAMUSCULAR | Status: AC
  Administered 2022-07-19: 80 mg via INTRA_ARTICULAR

## 2022-07-19 MED ORDER — ROPIVACAINE HCL 2 MG/ML IJ SOLN
INTRAMUSCULAR | Status: AC
  Filled 2022-07-19: qty 20

## 2022-07-19 NOTE — Patient Instructions (Signed)

## 2022-07-19 NOTE — Progress Notes (Signed)
PROVIDER NOTE: Interpretation of information contained herein should be left to medically-trained personnel. Specific patient instructions are provided elsewhere under "Patient Instructions" section of medical record. This document was created in part using STT-dictation technology, any transcriptional errors that may result from this process are unintentional.  Patient: Samantha Cox Type: Established DOB: November 15, 1955 MRN: [id number] PCP: Mick Sell, MD  Service: Procedure DOS: 07/19/2022 Setting: Ambulatory Location: Ambulatory outpatient facility Delivery: Face-to-face Provider: Oswaldo Done, MD Specialty: Interventional Pain Management Specialty designation: 09 Location: Outpatient facility Ref. Prov.: Mick Sell, MD       Interventional Therapy   Procedure: Sacroiliac Joint Steroid Injection #2    Laterality: Left     Level: PSIS (Posterior Superior Iliac Spine)  Imaging: Fluoroscopic guidance Anesthesia: Local anesthesia (1-2% Lidocaine) Anxiolysis: IV Versed         Sedation: Moderate Sedation                       DOS: 07/19/2022  Performed by: Oswaldo Done, MD  Purpose: Diagnostic/Therapeutic Indications: Sacroiliac joint pain in the lower back and hip area severe enough to impact quality of life or function. Rationale (medical necessity): procedure needed and proper for the diagnosis and/or treatment of Samantha Cox's medical symptoms and needs. 1. Chronic SI joint pain (Left)   2. Sacroiliac joint dysfunction (Left)   3. Somatic dysfunction of sacroiliac joint (Left)   4. Chronic hip pain (2ry area of Pain) (Bilateral) (R>L)   5. Chronic low back pain (1ry area of Pain) (Bilateral) (R>L) w/o sciatica    NAS-11 Pain score:   Pre-procedure: 4 /10   Post-procedure: 0-No pain/10     Target: Interarticular sacroiliac joint. Location: Medial to the postero-medial edge of iliac spine. Region: Lumbosacral-sacrococcygeal. Approach:  Superior postero-medial percutaneous approach. Type of procedure: Percutaneous joint injection.  Position / Prep / Materials:  Position: Prone  Prep solution: DuraPrep (Iodine Povacrylex [0.7% available iodine] and Isopropyl Alcohol, 74% w/w) Prep Area: Entire posterior lumbosacral area  Materials:  Tray: Block Needle(s):  Type: Spinal  Gauge (G): 22  Length: 5-in Qty: 1  Pre-op H&P Assessment:  Samantha Cox is a 67 y.o. (year old), female patient, seen today for interventional treatment. She  has a past surgical history that includes Other surgical history. Samantha Cox has a current medication list which includes the following prescription(s): anoro ellipta, escitalopram, ibuprofen, methocarbamol, multi-vitamin, vitamin d (ergocalciferol), methocarbamol, and NON FORMULARY, and the following Facility-Administered Medications: fentanyl. Her primarily concern today is the Back Pain (Left, lower)  Initial Vital Signs:  Pulse/HCG Rate: 63ECG Heart Rate: 60 (NSR) Temp: 98 F (36.7 C) Resp: (!) 97 BP: 103/71 SpO2: (!) 18 %  BMI: Estimated body mass index is 30.08 kg/m as calculated from the following:   Height as of this encounter: 5' (1.524 m).   Weight as of this encounter: 154 lb (69.9 kg).  Risk Assessment: Allergies: Reviewed. She is allergic to crestor [rosuvastatin], simvastatin, and augmentin [amoxicillin-pot clavulanate].  Allergy Precautions: None required Coagulopathies: Reviewed. None identified.  Blood-thinner therapy: None at this time Active Infection(s): Reviewed. None identified. Samantha Cox is afebrile  Site Confirmation: Samantha Cox was asked to confirm the procedure and laterality before marking the site Procedure checklist: Completed Consent: Before the procedure and under the influence of no sedative(s), amnesic(s), or anxiolytics, the patient was informed of the treatment options, risks and possible complications. To fulfill our ethical and legal  obligations, as recommended by the American  Medical Association's Code of Ethics, I have informed the patient of my clinical impression; the nature and purpose of the treatment or procedure; the risks, benefits, and possible complications of the intervention; the alternatives, including doing nothing; the risk(s) and benefit(s) of the alternative treatment(s) or procedure(s); and the risk(s) and benefit(s) of doing nothing. The patient was provided information about the general risks and possible complications associated with the procedure. These may include, but are not limited to: failure to achieve desired goals, infection, bleeding, organ or nerve damage, allergic reactions, paralysis, and death. In addition, the patient was informed of those risks and complications associated to the procedure, such as failure to decrease pain; infection; bleeding; organ or nerve damage with subsequent damage to sensory, motor, and/or autonomic systems, resulting in permanent pain, numbness, and/or weakness of one or several areas of the body; allergic reactions; (i.e.: anaphylactic reaction); and/or death. Furthermore, the patient was informed of those risks and complications associated with the medications. These include, but are not limited to: allergic reactions (i.e.: anaphylactic or anaphylactoid reaction(s)); adrenal axis suppression; blood sugar elevation that in diabetics may result in ketoacidosis or comma; water retention that in patients with history of congestive heart failure may result in shortness of breath, pulmonary edema, and decompensation with resultant heart failure; weight gain; swelling or edema; medication-induced neural toxicity; particulate matter embolism and blood vessel occlusion with resultant organ, and/or nervous system infarction; and/or aseptic necrosis of one or more joints. Finally, the patient was informed that Medicine is not an exact science; therefore, there is also the possibility of  unforeseen or unpredictable risks and/or possible complications that may result in a catastrophic outcome. The patient indicated having understood very clearly. We have given the patient no guarantees and we have made no promises. Enough time was given to the patient to ask questions, all of which were answered to the patient's satisfaction. Ms. Rushlow has indicated that she wanted to continue with the procedure. Attestation: I, the ordering provider, attest that I have discussed with the patient the benefits, risks, side-effects, alternatives, likelihood of achieving goals, and potential problems during recovery for the procedure that I have provided informed consent. Date  Time: 07/19/2022  9:08 AM  Pre-Procedure Preparation:  Monitoring: As per clinic protocol. Respiration, ETCO2, SpO2, BP, heart rate and rhythm monitor placed and checked for adequate function Safety Precautions: Patient was assessed for positional comfort and pressure points before starting the procedure. Time-out: I initiated and conducted the "Time-out" before starting the procedure, as per protocol. The patient was asked to participate by confirming the accuracy of the "Time Out" information. Verification of the correct person, site, and procedure were performed and confirmed by me, the nursing staff, and the patient. "Time-out" conducted as per Joint Commission's Universal Protocol (UP.01.01.01). Time: 0943 Start Time: 0943 hrs.  Description/Narrative of Procedure:          Start Time: 0943 hrs.  Rationale (medical necessity): procedure needed and proper for the diagnosis and/or treatment of the patient's medical symptoms and needs. Procedural Technique Safety Precautions: Aspiration looking for blood return was conducted prior to all injections. At no point did we inject any substances, as a needle was being advanced. No attempts were made at seeking any paresthesias. Safe injection practices and needle disposal techniques  used. Medications properly checked for expiration dates. SDV (single dose vial) medications used. Description of the Procedure: Protocol guidelines were followed. The patient was assisted into a comfortable position. The target area was identified and the  area prepped in the usual manner. Skin & deeper tissues infiltrated with local anesthetic. Appropriate amount of time allowed to pass for local anesthetics to take effect. The procedure needles were then advanced to the target area. Proper needle placement secured. Negative aspiration confirmed. Solution injected in intermittent fashion, asking for systemic symptoms every 0.5cc of injectate. The needles were then removed and the area cleansed, making sure to leave some of the prepping solution back to take advantage of its long term bactericidal properties.  Technical description of procedure:  Fluoroscopy using a posterior anterior 45 degree angle from the midline aiming at the anterolateral aspect of the patient was used to find a direct path into the sacroiliac joint, the superior medial to posterior superior iliac spine.  The skin was marked where the desired target and the skin infiltrated with local anesthetics.  The procedure needle was then advanced until the joint was entered.  Once inside of the joint, we then proceeded to inject the desired solution.  Vitals:   07/19/22 1005 07/19/22 1013 07/19/22 1019 07/19/22 1022  BP:  111/67  121/71  Pulse:      Resp:  17  18  Temp:    (!) 97.1 F (36.2 C)  TempSrc:    Temporal  SpO2: 100% 97% 95% 96%  Weight:      Height:      HC:         End Time: 0950 hrs.  Imaging Guidance (Non-Spinal):          Type of Imaging Technique: Fluoroscopy Guidance (Non-Spinal) Indication(s): Assistance in needle guidance and placement for procedures requiring needle placement in or near specific anatomical locations not easily accessible without such assistance. Exposure Time: Please see nurses  notes. Contrast: None used. Fluoroscopic Guidance: I was personally present during the use of fluoroscopy. "Tunnel Vision Technique" used to obtain the best possible view of the target area. Parallax error corrected before commencing the procedure. "Direction-depth-direction" technique used to introduce the needle under continuous pulsed fluoroscopy. Once target was reached, antero-posterior, oblique, and lateral fluoroscopic projection used confirm needle placement in all planes. Images permanently stored in EMR. Interpretation: No contrast injected. I personally interpreted the imaging intraoperatively. Adequate needle placement confirmed in multiple planes. Permanent images saved into the patient's record.  Post-operative Assessment:  Post-procedure Vital Signs:  Pulse/HCG Rate: 63(!) 59 Temp: (!) 97.1 F (36.2 C) Resp: 18 BP: 121/71 SpO2: 96 %  EBL: None  Complications: No immediate post-treatment complications observed by team, or reported by patient.  Note: The patient tolerated the entire procedure well. A repeat set of vitals were taken after the procedure and the patient was kept under observation following institutional policy, for this type of procedure. Post-procedural neurological assessment was performed, showing return to baseline, prior to discharge. The patient was provided with post-procedure discharge instructions, including a section on how to identify potential problems. Should any problems arise concerning this procedure, the patient was given instructions to immediately contact us, at any time, without hesitation. In any case, we plan to contact the patient by telephone for a follow-up status report regarding this interventional procedure.  Comments:  No additional relevant information.  Plan of Care (POC)  Orders:  Orders Placed This Encounter  Procedures   SACROILIAC JOINT INJECTION    Scheduling Instructions:     Side: Left-sided     Sedation: Patient's choice.      Timeframe: Today    Order Specific Question:   Where will this procedure  be performed?    Answer:   [facility]   DG [facility] C-ARM 1-60 MIN NO REPORT    Intraoperative interpretation by procedural physician at [facility].    Standing Status:   Standing    Number of Occurrences:   1    Order Specific Question:   Reason for exam:    Answer:   Assistance in needle guidance and placement for procedures requiring needle placement in or near specific anatomical locations not easily accessible without such assistance.   Informed Consent Details: Physician/Practitioner Attestation; Transcribe to consent form and obtain patient signature    Nursing Order: Transcribe to consent form and obtain patient signature. Note: Always confirm laterality of pain with Ms. Molitor, before procedure.    Order Specific Question:   Physician/Practitioner attestation of informed consent for procedure/surgical case    Answer:   I, the physician/practitioner, attest that I have discussed with the patient the benefits, risks, side effects, alternatives, likelihood of achieving goals and potential problems during recovery for the procedure that I have provided informed consent.    Order Specific Question:   Procedure    Answer:   Sacroiliac Joint Block    Order Specific Question:   Physician/Practitioner performing the procedure    Answer:   Ethan Kasperski A. Laban Emperor, MD    Order Specific Question:   Indication/Reason    Answer:   Chronic Low Back and Hip Pain secondary to Sacroiliac Joint Pain (Arthralgia/Arthropathy)   Follow-up    Schedule Ms. Cherne for a post-procedure follow-up evaluation encounter 2 weeks from now.    Standing Status:   Future    Standing Expiration Date:   08/02/2022    Scheduling Instructions:     Schedule follow-up visit on afternoon of procedure day (T, Th)     Type: Face-to-face (F2F) Post-procedure (PP) evaluation (E/M)     When: 2 weeks from now   Provide  equipment / supplies at bedside    Procedure tray: "Block Tray" (Disposable  single use) Skin infiltration needle: Regular 1.5-in, 25-G, (x1) Block Needle type: Spinal Amount/quantity: 1 Size: Medium (5-inch) Gauge: 22G    Standing Status:   Standing    Number of Occurrences:   1    Order Specific Question:   Specify    Answer:   Block Tray   Chronic Opioid Analgesic:  No chronic opioid analgesics therapy prescribed by our practice. None. (05/31/2021) Abnormal UDS (+) Carboxy-THC MME/day: 0 mg/day   Medications ordered for procedure: Meds ordered this encounter  Medications   lidocaine (XYLOCAINE) 2 % (with pres) injection 400 mg   pentafluoroprop-tetrafluoroeth (GEBAUERS) aerosol   lactated ringers infusion   midazolam (VERSED) 5 MG/5ML injection 0.5-2 mg    Make sure Flumazenil is available in the pyxis when using this medication. If oversedation occurs, administer 0.2 mg IV over 15 sec. If after 45 sec no response, administer 0.2 mg again over 1 min; may repeat at 1 min intervals; not to exceed 4 doses (1 mg)   fentaNYL (SUBLIMAZE) injection 25-50 mcg    Make sure Narcan is available in the pyxis when using this medication. In the event of respiratory depression (RR< 8/min): Titrate NARCAN (naloxone) in increments of 0.1 to 0.2 mg IV at 2-3 minute intervals, until desired degree of reversal.   ropivacaine (PF) 2 mg/mL (0.2%) (NAROPIN) injection 9 mL   methylPREDNISolone acetate (DEPO-MEDROL) injection 80 mg   Medications administered: We administered lidocaine, pentafluoroprop-tetrafluoroeth, lactated ringers, midazolam, fentaNYL, ropivacaine (PF)  2 mg/mL (0.2%), and methylPREDNISolone acetate.  See the medical record for exact dosing, route, and time of administration.  Follow-up plan:   Return in about 2 weeks (around 08/02/2022) for Proc-day (T,Th), (Face2F), (PPE).       Interventional Therapies  Risk Factors  Considerations:   (05/31/2021) Abnormal UDS (+) Carboxy-THC  (MARIJUANA)  COPD   POOR RFA Candidate. Non-compliant w/ instructions not to move.    Planned  Pending:   Therapeutic bilateral lumbar facet MBB (L4-5, L5-S1) #4    Under consideration:   Diagnostic/therapeutic bilateral L3 and L4 TFESI #1  Therapeutic bilateral lumbar facet MBB (L4-5, L5-S1) #4  NO RFA - unable to stay still    Completed:  (Non-compliant: Constant movement)  Diagnostic bilateral lumbar facet MBB (L3-S1) x3 (04/05/2022) (100/100/90/90) (100% LEP relief)  Diagnostic left SI joint Blk x1 (07/27/2021) (100/100/90/90) (100% LEP relief)    Completed by other providers:   ([facility] imaging)  [place] (Ultrasound-guided) right greater trochanteric inj. x1 by Dr. [name] (02/04/2020)  [place] right greater trochanteric bursa inj. x3 by Dr. [name] (11/25/2014; 07/10/2018; 10/19/2018)  [place] right SI joint inj. x1 by Dr. Rodolph Bong (06/05/2019)  [place] left gluteus medius insertion greater trochanteric inj. x1 by Dr. [name] (01/29/2019)  [place] left piriformis tendon sheath inj. x3 by Dr. [name] (12/02/2016; 08/23/2017; 02/15/2018)  [place] left foot neuroma inj. x1 (2 neuromas between 3rd & 4th, and 4th & 5th toes) by Dr. [name] (07/07/2016)  [place] right hip joint inj. x1 by Dr. [name] (12/04/2014)  Diagnostic bilateral L5 TFESI x1 (03/12/2021) by Filomena Jungling, MD (PMR) [facility])  Diagnostic left L5 dorsal ramus and S1, S2, and S3 lateral branch Blk x1, by Erick Colace, MD (09/10/2019)  Therapeutic left L5 dorsal ramus and S1, S2, and S3 lateral branch RFA x2, by Erick Colace, MD (10/04/2019 & 04/10/2020) (1st provide her with 50% relief of the pain.  2nd made things worse)   Therapeutic  Palliative (PRN) options:   Therapeutic/palliative lumbar facet MBB         Recent Visits Date Type Provider Dept  06/15/22 Office Visit Delano Metz, MD [facility]  04/25/22 Office Visit Delano Metz, MD [facility]  Showing recent  visits within past 90 days and meeting all other requirements Today's Visits Date Type Provider Dept  07/19/22 Procedure visit Delano Metz, MD [facility]  Showing today's visits and meeting all other requirements Future Appointments Date Type Provider Dept  08/04/22 Appointment Delano Metz, MD [facility]  Showing future appointments within next 90 days and meeting all other requirements  Disposition: Discharge home  Discharge (Date  Time): 07/19/2022; 1023 hrs.   Primary Care Physician: Mick Sell, MD Location: [facility] Note by: Oswaldo Done, MD (TTS technology used. I apologize for any typographical errors that were not detected and corrected.) Date: 07/19/2022; Time: 11:03 AM  Disclaimer:  Medicine is not an Visual merchandiser. The only guarantee in medicine is that nothing is guaranteed. It is important to note that the decision to proceed with this intervention was based on the information collected from the patient. The Data and conclusions were drawn from the patient's questionnaire, the interview, and the physical examination. Because the information was provided in large part by the patient, it cannot be guaranteed that it has not been purposely or unconsciously manipulated. Every effort has been made to obtain as much relevant data as possible  for this evaluation. It is important to note that the conclusions that lead to this procedure are derived in large part from the available data. Always take into account that the treatment will also be dependent on availability of resources and existing treatment guidelines, considered by other Pain Management Practitioners as being common knowledge and practice, at the time of the intervention. For Medico-Legal purposes, it is also important to point out that variation in procedural techniques and pharmacological choices are the acceptable norm. The indications,  contraindications, technique, and results of the above procedure should only be interpreted and judged by a Board-Certified Interventional Pain Specialist with extensive familiarity and expertise in the same exact procedure and technique.

## 2022-07-20 ENCOUNTER — Telehealth: Payer: Self-pay | Admitting: *Deleted

## 2022-07-20 DIAGNOSIS — S40862A Insect bite (nonvenomous) of left upper arm, initial encounter: Secondary | ICD-10-CM | POA: Diagnosis not present

## 2022-07-20 DIAGNOSIS — W57XXXA Bitten or stung by nonvenomous insect and other nonvenomous arthropods, initial encounter: Secondary | ICD-10-CM | POA: Diagnosis not present

## 2022-07-20 NOTE — Telephone Encounter (Signed)
Attempted to call for post procedure follow-up. Message left. 

## 2022-08-01 NOTE — Progress Notes (Signed)
PROVIDER NOTE: Information contained herein reflects review and annotations entered in association with encounter. Interpretation of such information and data should be left to medically-trained personnel. Information provided to patient can be located elsewhere in the medical record under "Patient Instructions". Document created using STT-dictation technology, any transcriptional errors that may result from process are unintentional.    Patient: Samantha Cox  Service Category: E/M  Provider: Oswaldo Done, MD  DOB: 27-Sep-1955  DOS: 08/04/2022  Referring Provider: Mick Sell, MD  MRN: [id number]  Specialty: Interventional Pain Management  PCP: Mick Sell, MD  Type: Established Patient  Setting: Ambulatory outpatient    Location: Office  Delivery: Face-to-face     HPI  [name], a 67 y.o. year old female, is here today because of her Chronic left SI joint pain [M53.3, G89.29]. [name] primary complain today is Back Pain (L. Lower back)  Pertinent problems: [name] has Greater trochanteric bursitis of hip (Left); Piriformis syndrome of left side; Morton's neuroma of foot (Left); Leg cramping; Degenerative lumbar spinal stenosis; Greater trochanteric bursitis of hip (Right); Facet arthritis of lumbar region; Chronic SI joint pain (Left); Hamstring tendinitis at origin; Myalgia due to statin; Cervical intraepithelial neoplasia grade 1; Chronic back pain; Degeneration of lumbar intervertebral disc; Enthesopathy of hip region (Right); Metatarsalgia of foot (Left); Pain in foot (Left); Plantar fasciitis of foot (Left); Chronic pain syndrome; Abnormal MRI, lumbar spine (07/28/2019); Cervicalgia (4th area of Pain); Chronic low back pain (1ry area of Pain) (Bilateral) (R>L) w/o sciatica; Lumbar facet syndrome (Bilateral); Lumbosacral facet hypertrophy (Multilevel) (Bilateral); Chronic lower extremity pain (3ry area of Pain) (nonradicular) (Bilateral) (L>R);  Cervicogenic headache (6th area of Pain); Chronic shoulder pain (5th area of Pain) (Bilateral); Chronic hip pain (2ry area of Pain) (Bilateral) (R>L); DDD (degenerative disc disease), lumbar; Osteoarthritis of acromioclavicular joints (Bilateral); Spondylosis of lumbar spine (Multilevel); Lumbar lateral recess stenosis (Left: L2-3, L3-4) (Right: L2-3, L4-5); Lumbar foraminal stenosis (Left: L2-3, L3-4, L4-5) (Right: L3-4, L4-5); Dextroscoliosis of lumbar spine (apex at L3-4); Grade 1 Anterolisthesis of lumbar spine (L4/L5) (5 mm); Osteoarthritis of hips (Bilateral) (L>R); Tendinopathy of gluteus medius (Right); Proximal hamstring tendon low-grade tear, sequela (Right); Superior labral tear (low-grade, partial-thickness) of hip joint, sequela (Right); Spondylosis without myelopathy or radiculopathy, lumbosacral region; Chronic musculoskeletal pain; Other spondylosis, sacral and sacrococcygeal region; Greater trochanteric bursitis (Bilateral); Sacroiliac joint dysfunction (Left); and Somatic dysfunction of sacroiliac joint (Left) on their pertinent problem list. Pain Assessment: Severity of Acute pain is reported as a 0-No pain/10. Location: Back Left, Lower/left knee. Onset: More than a month ago. Quality: Dull. Timing: Intermittent. Modifying factor(s): Advil, motion. Vitals:  height is 5\' 6"  (1.676 m) and weight is 155 lb (70.3 kg). Her temporal temperature is 97.2 F (36.2 C) (abnormal). Her blood pressure is 119/76 and her pulse is 71. Her respiration is 18 and oxygen saturation is 100%.  BMI: Estimated body mass index is 25.02 kg/m as calculated from the following:   Height as of this encounter: 5\' 6"  (1.676 m).   Weight as of this encounter: 155 lb (70.3 kg). Last encounter: 06/15/2022. Last procedure: 07/19/2022.  Reason for encounter: post-procedure evaluation and assessment. The patient indicates having attained a 95% improvement of the pain for the duration of the local anesthetic followed by an  ongoing 90% benefit.  This confirms that her low back pain is multifactorial with 1 of its components being the sacroiliac joint.  Based on the results of the patient's diagnostic nerve  blocks, she has a multifactorial low back pain with a component coming from the facet joints (L4-5, L5-S1) and a component coming from the sacroiliac joint.  At this point the patient indicates doing well and not requiring anything else.  We will see her on a as needed basis.  She was encouraged to give [place] a call when the pain returns.  She understood and accepted.  Post-procedure evaluation   Procedure: Sacroiliac Joint Steroid Injection #2    Laterality: Left     Level: PSIS (Posterior Superior Iliac Spine)  Imaging: Fluoroscopic guidance Anesthesia: Local anesthesia (1-2% Lidocaine) Anxiolysis: IV Versed         Sedation: Moderate Sedation                       DOS: 07/19/2022  Performed by: Oswaldo Done, MD  Purpose: Diagnostic/Therapeutic Indications: Sacroiliac joint pain in the lower back and hip area severe enough to impact quality of life or function. Rationale (medical necessity): procedure needed and proper for the diagnosis and/or treatment of [name] medical symptoms and needs. 1. Chronic SI joint pain (Left)   2. Sacroiliac joint dysfunction (Left)   3. Somatic dysfunction of sacroiliac joint (Left)   4. Chronic hip pain (2ry area of Pain) (Bilateral) (R>L)   5. Chronic low back pain (1ry area of Pain) (Bilateral) (R>L) w/o sciatica    NAS-11 Pain score:   Pre-procedure: 4 /10   Post-procedure: 0-No pain/10      Effectiveness:  Initial hour after procedure: 95 %. Subsequent 4-6 hours post-procedure: 95 %. Analgesia past initial 6 hours: 90 %. Ongoing improvement:  Analgesic: The patient indicates having attained a 95% improvement of the pain for the duration of the local anesthetic followed by an ongoing 90% benefit.  This confirms that her low back pain is multifactorial  with 1 of its components being the sacroiliac joint. Function: [name] reports improvement in function ROM: [name] reports improvement in ROM  Pharmacotherapy Assessment  Analgesic: No chronic opioid analgesics therapy prescribed by our practice. None. (05/31/2021) Abnormal UDS (+) Carboxy-THC MME/day: 0 mg/day   Monitoring: [place] PMP: PDMP reviewed during this encounter.       Pharmacotherapy: No side-effects or adverse reactions reported. Compliance: No problems identified. Effectiveness: Clinically acceptable.  No notes on file  CBD:THC Ratio  Date Value Ref Range Status  05/31/2021 Comment RATIO Final    Comment:    INTERFERENCE Unable to complete testing due to unknown interference.  This test measures Cannabidiol (CBD) and Tetrahydrocannabinol (THC) and metabolites in urine. The CBD:THC ratio is calculated using the sums of the respective metabolites and is intended to assist in differentiating the presence of Tetrahydrocannabinol [facility]) metabolites due to the use of marijuana or medicinal THC from the presence of THC metabolites due to use of Cannabidiol (CBD) or hemp products that purportedly contain trace amounts of THC.  CBD:THC Ratio          Interpretation ---------      --------------------------------------------- >=10.0         Consistent with the use of CBD products only 1.0 - 9.9      Indeterminate <1.0           Consistent with marijuana, medicinal THC or                mixed use  Interpretive ranges are provided as guidance and should not be considered definitive. Interpretation of results should include consideration of  all relevant clinical and diagnostic information.  Analysis performed by chromatography with mass spectrometry. This test was developed and its performance characteristics determined by [facility].  It has not been cleared or approved by the Food and Drug Administration.    Carboxy-Delta-8-THC  Date Value Ref Range Status   05/31/2021 >500 ng/mL Final   Carboxy-Delta-9-THC  Date Value Ref Range Status  05/31/2021 117.4 ng/mL Final    Comment:    Carboxy-Delta-9-THC is the primary metabolite of Delta-9- Tetrahydrocannabinol. Sources include the prescription medication Dronabinol as well as illicit, recreational, and medical marijuana and marijuana derived products of the same categories.  Carboxy-Delta-8-THC is the primary metabolite of Delta-8- Tetrahydrocannabinol. Sources are products containing Delta- 8-THC, which is primarily chemically manufactured from cannabidiol (CBD).  Testing Threshold = 2.0 ng/mL  Analysis performed by Liquid Chromatography with Tandem Mass Spectrometry (LC/MS/MS).  This test was developed and its performance characteristics determined by [facility].  It has not been cleared or approved by the Food and Drug Administration.     UDS:  Summary  Date Value Ref Range Status  05/31/2021 Note  Final    Comment:    ==================================================================== Compliance Drug Analysis, Ur ==================================================================== Test                             Result       Flag       Units  Drug Present and Declared for Prescription Verification   Acetaminophen                  PRESENT      EXPECTED   Ibuprofen                      PRESENT      EXPECTED  Drug Present not Declared for Prescription Verification   Carboxy-THC                    997          UNEXPECTED ng/mg creat    Carboxy-THC is a metabolite of tetrahydrocannabinol (THC). Source of    THC is most commonly herbal marijuana or marijuana-based products,    but THC is also present in a scheduled prescription medication.    Trace amounts of THC can be present in hemp and cannabidiol (CBD)    products. This test is not intended to distinguish between delta-9-    tetrahydrocannabinol, the predominant form of THC in most herbal or    marijuana-based products,  and delta-8-tetrahydrocannabinol.  Drug Absent but Declared for Prescription Verification   Methocarbamol                  Not Detected UNEXPECTED   Salicylate                     Not Detected UNEXPECTED    Aspirin, as indicated in the declared medication list, is not always    detected even when used as directed.  ==================================================================== Test                      Result    Flag   Units      Ref Range   Creatinine              75               mg/dL      >=01 ====================================================================  Declared Medications:  The flagging and interpretation on this report are based on the  following declared medications.  Unexpected results may arise from  inaccuracies in the declared medications.   **Note: The testing scope of this panel includes these medications:   Methocarbamol (Robaxin)   **Note: The testing scope of this panel does not include small to  moderate amounts of these reported medications:   Acetaminophen (Tylenol)  Aspirin  Ibuprofen (Advil)   **Note: The testing scope of this panel does not include the  following reported medications:   Calcium (Tums)  Multivitamin  Umeclidinium (Anoro)  Vilanterol (Anoro)  Vitamin D2 (Drisdol) ==================================================================== For clinical consultation, please call [phone]. ====================================================================       ROS  Constitutional: Denies any fever or chills Gastrointestinal: No reported hemesis, hematochezia, vomiting, or acute GI distress Musculoskeletal: Denies any acute onset joint swelling, redness, loss of ROM, or weakness Neurological: No reported episodes of acute onset apraxia, aphasia, dysarthria, agnosia, amnesia, paralysis, loss of coordination, or loss of consciousness  Medication Review  Multi-Vitamin, NON FORMULARY, Vitamin D (Ergocalciferol),  escitalopram, ibuprofen, methocarbamol, and umeclidinium-vilanterol  History Review  Allergy: Ms. Florian is allergic to crestor [rosuvastatin], simvastatin, and augmentin [amoxicillin-pot clavulanate]. Drug: Ms. Bartholomew  reports no history of drug use. Alcohol:  reports current alcohol use of about 1.0 standard drink of alcohol per week. Tobacco:  reports that she has been smoking e-cigarettes. She uses smokeless tobacco. Social: Ms. Vesper  reports that she has been smoking e-cigarettes. She uses smokeless tobacco. She reports current alcohol use of about 1.0 standard drink of alcohol per week. She reports that she does not use drugs. Medical:  has a past medical history of History of tobacco use, Routine general medical examination at a health care facility, and Screening for lipoid disorders. Surgical: Ms. Quamme  has a past surgical history that includes Other surgical history. Family: family history includes Asthma in her mother; Atrial fibrillation in her father; Bladder Cancer in her father; CVA (age of onset: 42) in her father; Diverticulitis in her brother; Hypertension in her mother; Lung cancer in her brother.  Laboratory Chemistry Profile   Renal Lab Results  Component Value Date   BUN 25 (H) 05/31/2021   CREATININE 0.68 05/31/2021   GFR 84.17 09/20/2016   GFRAA >60 01/16/2019   GFRNONAA >60 05/31/2021    Hepatic Lab Results  Component Value Date   AST 28 05/31/2021   ALT 29 05/31/2021   ALBUMIN 3.8 05/31/2021   ALKPHOS 79 05/31/2021    Electrolytes Lab Results  Component Value Date   NA 139 05/31/2021   K 4.2 05/31/2021   CL 107 05/31/2021   CALCIUM 9.4 05/31/2021   MG 2.3 05/31/2021    Bone Lab Results  Component Value Date   VD25OH 32.18 09/20/2016   25OHVITD1 69 05/31/2021   25OHVITD2 36 05/31/2021   25OHVITD3 33 05/31/2021    Inflammation (CRP: Acute Phase) (ESR: Chronic Phase) Lab Results  Component Value Date   CRP 0.7 05/31/2021    ESRSEDRATE 5 05/31/2021         Note: Above Lab results reviewed.  Recent Imaging Review  MM 3D SCREENING MAMMOGRAM BILATERAL BREAST CLINICAL DATA:  Screening.  EXAM: DIGITAL SCREENING BILATERAL MAMMOGRAM WITH TOMOSYNTHESIS AND CAD  TECHNIQUE: Bilateral screening digital craniocaudal and mediolateral oblique mammograms were obtained. Bilateral screening digital breast tomosynthesis was performed. The images were evaluated with computer-aided detection.  COMPARISON:  Previous exam(s).  ACR Breast Density Category b: There are  scattered areas of fibroglandular density.  FINDINGS: There are no findings suspicious for malignancy.  IMPRESSION: No mammographic evidence of malignancy. A result letter of this screening mammogram will be mailed directly to the patient.  RECOMMENDATION: Screening mammogram in one year. (Code:SM-B-01Y)  BI-RADS CATEGORY  1: Negative.  Electronically Signed   By: Emmaline Kluver M.D.   On: 07/21/2022 15:24 Note: Reviewed        Physical Exam  General appearance: Well nourished, well developed, and well hydrated. In no apparent acute distress Mental status: Alert, oriented x 3 (person, place, & time)       Respiratory: No evidence of acute respiratory distress Eyes: PERLA Vitals: BP 119/76   Pulse 71   Temp (!) 97.2 F (36.2 C) (Temporal)   Resp 18   Ht 5\' 6"  (1.676 m)   Wt 155 lb (70.3 kg)   SpO2 100%   BMI 25.02 kg/m  BMI: Estimated body mass index is 25.02 kg/m as calculated from the following:   Height as of this encounter: 5\' 6"  (1.676 m).   Weight as of this encounter: 155 lb (70.3 kg). Ideal: Ideal body weight: 59.3 kg (130 lb 11.7 oz) Adjusted ideal body weight: 63.7 kg (140 lb 7 oz)  Assessment   Diagnosis Status  1. Chronic SI joint pain (Left)   2. Chronic low back pain (1ry area of Pain) (Bilateral) (R>L) w/o sciatica   3. Chronic hip pain (2ry area of Pain) (Bilateral) (R>L)   4. Postop check     Improved Improved Improved   Updated Problems: No problems updated.  Plan of Care  Problem-specific:  No problem-specific Assessment & Plan notes found for this encounter.  Ms. AZRA PANTEL has a current medication list which includes the following long-term medication(s): escitalopram, methocarbamol, and methocarbamol.  Pharmacotherapy (Medications Ordered): No orders of the defined types were placed in this encounter.  Orders:  Orders Placed This Encounter  Procedures   Nursing Instructions:    Please complete this patient's postprocedure evaluation.    Scheduling Instructions:     Please complete this patient's postprocedure evaluation.   Follow-up plan:   Return if symptoms worsen or fail to improve.      Interventional Therapies  Risk Factors  Considerations:   (05/31/2021) Abnormal UDS (+) Carboxy-THC ([place])  COPD   POOR RFA Candidate. Non-compliant w/ instructions not to move.    Planned  Pending:      Under consideration:   Diagnostic/therapeutic bilateral L3 and L4 TFESI #1  Therapeutic bilateral lumbar facet MBB (L4-5, L5-S1) #4  NO RFA - unable to stay still    Completed:  (Non-compliant: Constant movement)  Diagnostic bilateral lumbar facet MBB (L3-S1) x3 (04/05/2022) (100/100/90/90) (100% LEP relief)  Diagnostic left SI joint Blk x2 (07/19/2022) (100/100/90/90) (100% LEP relief)    Completed by other providers:   ([facility])  [place] (Ultrasound-guided) right greater trochanteric inj. x1 by Dr. [name] (02/04/2020)  [place] right greater trochanteric bursa inj. x3 by Dr. [name] (11/25/2014; 07/10/2018; 10/19/2018)  [place] right SI joint inj. x1 by Dr. Rodolph Bong (06/05/2019)  [place] left gluteus medius insertion greater trochanteric inj. x1 by Dr. [name] (01/29/2019)  [place] left piriformis tendon sheath inj. x3 by Dr. [name] (12/02/2016; 08/23/2017; 02/15/2018)  [place] left foot neuroma inj. x1 (2 neuromas between 3rd & 4th, and 4th &  5th toes) by Dr. [name] (07/07/2016)  [place] right hip joint inj. x1 by Dr. [name] (12/04/2014)  Diagnostic bilateral L5 TFESI x1 (03/12/2021) by Filomena Jungling, MD (PMR) [facility])  Diagnostic left L5 dorsal ramus and S1, S2, and S3 lateral branch Blk x1, by Erick Colace, MD (09/10/2019)  Therapeutic left L5 dorsal ramus and S1, S2, and S3 lateral branch RFA x2, by Erick Colace, MD (10/04/2019 & 04/10/2020) (1st provide her with 50% relief of the pain.  2nd made things worse)   Therapeutic  Palliative (PRN) options:   Therapeutic/palliative lumbar facet MBB          Recent Visits Date Type Provider Dept  07/19/22 Procedure visit Delano Metz, MD [facility]  06/15/22 Office Visit Delano Metz, MD [facility]  Showing recent visits within past 90 days and meeting all other requirements Today's Visits Date Type Provider Dept  08/04/22 Office Visit Delano Metz, MD [facility]  Showing today's visits and meeting all other requirements Future Appointments No visits were found meeting these conditions. Showing future appointments within next 90 days and meeting all other requirements  I discussed the assessment and treatment plan with the patient. The patient was provided an opportunity to ask questions and all were answered. The patient agreed with the plan and demonstrated an understanding of the instructions.  Patient advised to call back or seek an in-person evaluation if the symptoms or condition worsens.  Duration of encounter: 30 minutes.  Total time on encounter, as per AMA guidelines included both the face-to-face and non-face-to-face time personally spent by the physician and/or other qualified health care professional(s) on the day of the encounter (includes time in activities that require the physician or other qualified health care professional and does not include time in activities normally performed by  clinical staff). Physician's time may include the following activities when performed: Preparing to see the patient (e.g., pre-charting review of records, searching for previously ordered imaging, lab work, and nerve conduction tests) Review of prior analgesic pharmacotherapies. Reviewing PMP Interpreting ordered tests (e.g., lab work, imaging, nerve conduction tests) Performing post-procedure evaluations, including interpretation of diagnostic procedures Obtaining and/or reviewing separately obtained history Performing a medically appropriate examination and/or evaluation Counseling and educating the patient/family/caregiver Ordering medications, tests, or procedures Referring and communicating with other health care professionals (when not separately reported) Documenting clinical information in the electronic or other health record Independently interpreting results (not separately reported) and communicating results to the patient/ family/caregiver Care coordination (not separately reported)  Note by: Oswaldo Done, MD Date: 08/04/2022; Time: 2:46 PM

## 2022-08-04 ENCOUNTER — Encounter: Payer: Self-pay | Admitting: Pain Medicine

## 2022-08-04 ENCOUNTER — Ambulatory Visit: Payer: BC Managed Care – PPO | Attending: Pain Medicine | Admitting: Pain Medicine

## 2022-08-04 VITALS — BP 119/76 | HR 71 | Temp 97.2°F | Resp 18 | Ht 66.0 in | Wt 155.0 lb

## 2022-08-04 DIAGNOSIS — Z09 Encounter for follow-up examination after completed treatment for conditions other than malignant neoplasm: Secondary | ICD-10-CM | POA: Insufficient documentation

## 2022-08-04 DIAGNOSIS — M545 Low back pain, unspecified: Secondary | ICD-10-CM | POA: Diagnosis not present

## 2022-08-04 DIAGNOSIS — M25552 Pain in left hip: Secondary | ICD-10-CM | POA: Insufficient documentation

## 2022-08-04 DIAGNOSIS — G8929 Other chronic pain: Secondary | ICD-10-CM | POA: Diagnosis not present

## 2022-08-04 DIAGNOSIS — M25551 Pain in right hip: Secondary | ICD-10-CM | POA: Insufficient documentation

## 2022-08-04 DIAGNOSIS — M533 Sacrococcygeal disorders, not elsewhere classified: Secondary | ICD-10-CM | POA: Diagnosis not present

## 2022-10-04 NOTE — Progress Notes (Unsigned)
PROVIDER NOTE: Information contained herein reflects review and annotations entered in association with encounter. Interpretation of such information and data should be left to medically-trained personnel. Information provided to patient can be located elsewhere in the medical record under "Patient Instructions". Document created using STT-dictation technology, any transcriptional errors that may result from process are unintentional.    Patient: Samantha Cox  Service Category: E/M  Provider: Oswaldo Done, MD  DOB: 04-Jun-1955  DOS: 10/05/2022  Referring Provider: Mick Sell, MD  MRN: [id number]  Specialty: Interventional Pain Management  PCP: Mick Sell, MD  Type: Established Patient  Setting: Ambulatory outpatient    Location: Office  Delivery: Face-to-face     HPI  [name], a 67 y.o. year old female, is here today because of her No primary diagnosis found.. Ms. Szuba primary complain today is No chief complaint on file.  Pertinent problems: Ms. Orris has Greater trochanteric bursitis of hip (Left); Piriformis syndrome of left side; Morton's neuroma of foot (Left); Leg cramping; Degenerative lumbar spinal stenosis; Greater trochanteric bursitis of hip (Right); Facet arthritis of lumbar region; Chronic SI joint pain (Left); Hamstring tendinitis at origin; Myalgia due to statin; Cervical intraepithelial neoplasia grade 1; Chronic back pain; Degeneration of lumbar intervertebral disc; Enthesopathy of hip region (Right); Metatarsalgia of foot (Left); Pain in foot (Left); Plantar fasciitis of foot (Left); Chronic pain syndrome; Abnormal MRI, lumbar spine (07/28/2019); Cervicalgia (4th area of Pain); Chronic low back pain (1ry area of Pain) (Bilateral) (R>L) w/o sciatica; Lumbar facet syndrome (Bilateral); Lumbosacral facet hypertrophy (Multilevel) (Bilateral); Chronic lower extremity pain (3ry area of Pain) (nonradicular) (Bilateral) (L>R); Cervicogenic  headache (6th area of Pain); Chronic shoulder pain (5th area of Pain) (Bilateral); Chronic hip pain (2ry area of Pain) (Bilateral) (R>L); DDD (degenerative disc disease), lumbar; Osteoarthritis of acromioclavicular joints (Bilateral); Spondylosis of lumbar spine (Multilevel); Lumbar lateral recess stenosis (Left: L2-3, L3-4) (Right: L2-3, L4-5); Lumbar foraminal stenosis (Left: L2-3, L3-4, L4-5) (Right: L3-4, L4-5); Dextroscoliosis of lumbar spine (apex at L3-4); Grade 1 Anterolisthesis of lumbar spine (L4/L5) (5 mm); Osteoarthritis of hips (Bilateral) (L>R); Tendinopathy of gluteus medius (Right); Proximal hamstring tendon low-grade tear, sequela (Right); Superior labral tear (low-grade, partial-thickness) of hip joint, sequela (Right); Spondylosis without myelopathy or radiculopathy, lumbosacral region; Chronic musculoskeletal pain; Other spondylosis, sacral and sacrococcygeal region; Greater trochanteric bursitis (Bilateral); Sacroiliac joint dysfunction (Left); and Somatic dysfunction of sacroiliac joint (Left) on their pertinent problem list. Pain Assessment: Severity of   is reported as a  /10. Location:    / . Onset:  . Quality:  . Timing:  . Modifying factor(s):  Marland Kitchen Vitals:  vitals were not taken for this visit.  BMI: Estimated body mass index is 25.02 kg/m as calculated from the following:   Height as of 08/04/22: 5\' 6"  (1.676 m).   Weight as of 08/04/22: 155 lb (70.3 kg). Last encounter: 08/04/2022. Last procedure: 07/19/2022.  Reason for encounter:  *** . ***  Pharmacotherapy Assessment  Analgesic: No chronic opioid analgesics therapy prescribed by our practice. None. (05/31/2021) Abnormal UDS (+) Carboxy-THC MME/day: 0 mg/day   Monitoring:  PMP: PDMP reviewed during this encounter.       Pharmacotherapy: No side-effects or adverse reactions reported. Compliance: No problems identified. Effectiveness: Clinically acceptable.  No notes on file  CBD:THC Ratio  Date Value Ref Range Status   05/31/2021 Comment RATIO Final    Comment:    INTERFERENCE Unable to complete testing due to unknown interference.  This test measures  Cannabidiol (CBD) and Tetrahydrocannabinol (THC) and metabolites in urine. The CBD:THC ratio is calculated using the sums of the respective metabolites and is intended to assist in differentiating the presence of Tetrahydrocannabinol [facility]) metabolites due to the use of marijuana or medicinal THC from the presence of THC metabolites due to use of Cannabidiol (CBD) or hemp products that purportedly contain trace amounts of THC.  CBD:THC Ratio          Interpretation ---------      --------------------------------------------- >=10.0         Consistent with the use of CBD products only 1.0 - 9.9      Indeterminate <1.0           Consistent with marijuana, medicinal THC or                mixed use  Interpretive ranges are provided as guidance and should not be considered definitive. Interpretation of results should include consideration of all relevant clinical and diagnostic information.  Analysis performed by chromatography with mass spectrometry. This test was developed and its performance characteristics determined by [facility].  It has not been cleared or approved by the Food and Drug Administration.    Carboxy-Delta-8-THC  Date Value Ref Range Status  05/31/2021 >500 ng/mL Final   Carboxy-Delta-9-THC  Date Value Ref Range Status  05/31/2021 117.4 ng/mL Final    Comment:    Carboxy-Delta-9-THC is the primary metabolite of Delta-9- Tetrahydrocannabinol. Sources include the prescription medication Dronabinol as well as illicit, recreational, and medical marijuana and marijuana derived products of the same categories.  Carboxy-Delta-8-THC is the primary metabolite of Delta-8- Tetrahydrocannabinol. Sources are products containing Delta- 8-THC, which is primarily chemically manufactured from cannabidiol (CBD).  Testing Threshold = 2.0  ng/mL  Analysis performed by Liquid Chromatography with Tandem Mass Spectrometry (LC/MS/MS).  This test was developed and its performance characteristics determined by [facility].  It has not been cleared or approved by the Food and Drug Administration.     UDS:  Summary  Date Value Ref Range Status  05/31/2021 Note  Final    Comment:    ==================================================================== Compliance Drug Analysis, Ur ==================================================================== Test                             Result       Flag       Units  Drug Present and Declared for Prescription Verification   Acetaminophen                  PRESENT      EXPECTED   Ibuprofen                      PRESENT      EXPECTED  Drug Present not Declared for Prescription Verification   Carboxy-THC                    997          UNEXPECTED ng/mg creat    Carboxy-THC is a metabolite of tetrahydrocannabinol (THC). Source of    THC is most commonly herbal marijuana or marijuana-based products,    but THC is also present in a scheduled prescription medication.    Trace amounts of THC can be present in hemp and cannabidiol (CBD)    products. This test is not intended to distinguish between delta-9-    tetrahydrocannabinol, the predominant form of THC in most herbal or  marijuana-based products, and delta-8-tetrahydrocannabinol.  Drug Absent but Declared for Prescription Verification   Methocarbamol                  Not Detected UNEXPECTED   Salicylate                     Not Detected UNEXPECTED    Aspirin, as indicated in the declared medication list, is not always    detected even when used as directed.  ==================================================================== Test                      Result    Flag   Units      Ref Range   Creatinine              75               mg/dL      >=86 ==================================================================== Declared  Medications:  The flagging and interpretation on this report are based on the  following declared medications.  Unexpected results may arise from  inaccuracies in the declared medications.   **Note: The testing scope of this panel includes these medications:   Methocarbamol (Robaxin)   **Note: The testing scope of this panel does not include small to  moderate amounts of these reported medications:   Acetaminophen (Tylenol)  Aspirin  Ibuprofen (Advil)   **Note: The testing scope of this panel does not include the  following reported medications:   Calcium (Tums)  Multivitamin  Umeclidinium (Anoro)  Vilanterol (Anoro)  Vitamin D2 (Drisdol) ==================================================================== For clinical consultation, please call [phone]. ====================================================================       ROS  Constitutional: Denies any fever or chills Gastrointestinal: No reported hemesis, hematochezia, vomiting, or acute GI distress Musculoskeletal: Denies any acute onset joint swelling, redness, loss of ROM, or weakness Neurological: No reported episodes of acute onset apraxia, aphasia, dysarthria, agnosia, amnesia, paralysis, loss of coordination, or loss of consciousness  Medication Review  Multi-Vitamin, NON FORMULARY, Vitamin D (Ergocalciferol), escitalopram, ibuprofen, methocarbamol, and umeclidinium-vilanterol  History Review  Allergy: Ms. Schallhorn is allergic to crestor [rosuvastatin], simvastatin, and augmentin [amoxicillin-pot clavulanate]. Drug: Ms. Levar  reports no history of drug use. Alcohol:  reports current alcohol use of about 1.0 standard drink of alcohol per week. Tobacco:  reports that she has been smoking e-cigarettes. She uses smokeless tobacco. Social: Ms. Cavett  reports that she has been smoking e-cigarettes. She uses smokeless tobacco. She reports current alcohol use of about 1.0 standard drink of alcohol  per week. She reports that she does not use drugs. Medical:  has a past medical history of History of tobacco use, Routine general medical examination at a health care facility, and Screening for lipoid disorders. Surgical: Ms. Schroeder  has a past surgical history that includes Other surgical history. Family: family history includes Asthma in her mother; Atrial fibrillation in her father; Bladder Cancer in her father; CVA (age of onset: 60) in her father; Diverticulitis in her brother; Hypertension in her mother; Lung cancer in her brother.  Laboratory Chemistry Profile   Renal Lab Results  Component Value Date   BUN 25 (H) 05/31/2021   CREATININE 0.68 05/31/2021   GFR 84.17 09/20/2016   GFRAA >60 01/16/2019   GFRNONAA >60 05/31/2021    Hepatic Lab Results  Component Value Date   AST 28 05/31/2021   ALT 29 05/31/2021   ALBUMIN 3.8 05/31/2021   ALKPHOS 79 05/31/2021    Electrolytes Lab Results  Component Value Date   NA 139 05/31/2021   K 4.2 05/31/2021   CL 107 05/31/2021   CALCIUM 9.4 05/31/2021   MG 2.3 05/31/2021    Bone Lab Results  Component Value Date   VD25OH 32.18 09/20/2016   25OHVITD1 69 05/31/2021   25OHVITD2 36 05/31/2021   25OHVITD3 33 05/31/2021    Inflammation (CRP: Acute Phase) (ESR: Chronic Phase) Lab Results  Component Value Date   CRP 0.7 05/31/2021   ESRSEDRATE 5 05/31/2021         Note: Above Lab results reviewed.  Recent Imaging Review  MM 3D SCREENING MAMMOGRAM BILATERAL BREAST CLINICAL DATA:  Screening.  EXAM: DIGITAL SCREENING BILATERAL MAMMOGRAM WITH TOMOSYNTHESIS AND CAD  TECHNIQUE: Bilateral screening digital craniocaudal and mediolateral oblique mammograms were obtained. Bilateral screening digital breast tomosynthesis was performed. The images were evaluated with computer-aided detection.  COMPARISON:  Previous exam(s).  ACR Breast Density Category b: There are scattered areas of fibroglandular  density.  FINDINGS: There are no findings suspicious for malignancy.  IMPRESSION: No mammographic evidence of malignancy. A result letter of this screening mammogram will be mailed directly to the patient.  RECOMMENDATION: Screening mammogram in one year. (Code:SM-B-01Y)  BI-RADS CATEGORY  1: Negative.  Electronically Signed   By: Emmaline Kluver M.D.   On: 07/21/2022 15:24 Note: Reviewed        Physical Exam  General appearance: Well nourished, well developed, and well hydrated. In no apparent acute distress Mental status: Alert, oriented x 3 (person, place, & time)       Respiratory: No evidence of acute respiratory distress Eyes: PERLA Vitals: There were no vitals taken for this visit. BMI: Estimated body mass index is 25.02 kg/m as calculated from the following:   Height as of 08/04/22: 5\' 6"  (1.676 m).   Weight as of 08/04/22: 155 lb (70.3 kg). Ideal: Patient weight not recorded  Assessment   Diagnosis Status  No diagnosis found. Controlled Controlled Controlled   Updated Problems: No problems updated.  Plan of Care  Problem-specific:  No problem-specific Assessment & Plan notes found for this encounter.  Ms. JAMELLAH ROSSIN has a current medication list which includes the following long-term medication(s): escitalopram, methocarbamol, and methocarbamol.  Pharmacotherapy (Medications Ordered): No orders of the defined types were placed in this encounter.  Orders:  No orders of the defined types were placed in this encounter.  Follow-up plan:   No follow-ups on file.      Interventional Therapies  Risk Factors  Considerations:   (05/31/2021) Abnormal UDS (+) Carboxy-THC ([place])  COPD   POOR RFA Candidate. Non-compliant w/ instructions not to move.    Planned  Pending:      Under consideration:   Diagnostic/therapeutic bilateral L3 and L4 TFESI #1  Therapeutic bilateral lumbar facet MBB (L4-5, L5-S1) #4  NO RFA - unable to stay still     Completed:  (Non-compliant: Constant movement)  Diagnostic bilateral lumbar facet MBB (L3-S1) x3 (04/05/2022) (100/100/90/90) (100% LEP relief)  Diagnostic left SI joint Blk x2 (07/19/2022) (100/100/90/90) (100% LEP relief)    Completed by other providers:   ([place] [facility])  [place] (Ultrasound-guided) right greater trochanteric inj. x1 by Dr. [name] (02/04/2020)  [place] right greater trochanteric bursa inj. x3 by Dr. [name] (11/25/2014; 07/10/2018; 10/19/2018)  [place] right SI joint inj. x1 by Dr. Rodolph Bong (06/05/2019)  [place] left gluteus medius insertion greater trochanteric inj. x1 by Dr. [name] (01/29/2019)  [place] left piriformis tendon sheath inj. x3  by Dr. [name] (12/02/2016; 08/23/2017; 02/15/2018)  [place] left foot neuroma inj. x1 (2 neuromas between 3rd & 4th, and 4th & 5th toes) by Dr. [name] (07/07/2016)  [place] right hip joint inj. x1 by Dr. [name] (12/04/2014)  Diagnostic bilateral L5 TFESI x1 (03/12/2021) by Filomena Jungling, MD (PMR) [facility])  Diagnostic left L5 dorsal ramus and S1, S2, and S3 lateral branch Blk x1, by Erick Colace, MD (09/10/2019)  Therapeutic left L5 dorsal ramus and S1, S2, and S3 lateral branch RFA x2, by Erick Colace, MD (10/04/2019 & 04/10/2020) (1st provide her with 50% relief of the pain.  2nd made things worse)   Therapeutic  Palliative (PRN) options:   Therapeutic/palliative lumbar facet MBB           Recent Visits Date Type Provider Dept  08/04/22 Office Visit Delano Metz, MD [facility]  07/19/22 Procedure visit Delano Metz, MD [facility]  Showing recent visits within past 90 days and meeting all other requirements Future Appointments Date Type Provider Dept  10/05/22 Appointment Delano Metz, MD [facility]  Showing future appointments within next 90 days and meeting all other requirements  I discussed the assessment and treatment plan with the patient. The  patient was provided an opportunity to ask questions and all were answered. The patient agreed with the plan and demonstrated an understanding of the instructions.  Patient advised to call back or seek an in-person evaluation if the symptoms or condition worsens.  Duration of encounter: *** minutes.  Total time on encounter, as per AMA guidelines included both the face-to-face and non-face-to-face time personally spent by the physician and/or other qualified health care professional(s) on the day of the encounter (includes time in activities that require the physician or other qualified health care professional and does not include time in activities normally performed by clinical staff). Physician's time may include the following activities when performed: Preparing to see the patient (e.g., pre-charting review of records, searching for previously ordered imaging, lab work, and nerve conduction tests) Review of prior analgesic pharmacotherapies. Reviewing PMP Interpreting ordered tests (e.g., lab work, imaging, nerve conduction tests) Performing post-procedure evaluations, including interpretation of diagnostic procedures Obtaining and/or reviewing separately obtained history Performing a medically appropriate examination and/or evaluation Counseling and educating the patient/family/caregiver Ordering medications, tests, or procedures Referring and communicating with other health care professionals (when not separately reported) Documenting clinical information in the electronic or other health record Independently interpreting results (not separately reported) and communicating results to the patient/ family/caregiver Care coordination (not separately reported)  Note by: Oswaldo Done, MD Date: 10/05/2022; Time: 7:43 AM

## 2022-10-05 ENCOUNTER — Encounter: Payer: Self-pay | Admitting: Pain Medicine

## 2022-10-05 ENCOUNTER — Telehealth: Payer: Self-pay

## 2022-10-05 ENCOUNTER — Ambulatory Visit: Payer: BC Managed Care – PPO | Attending: Pain Medicine | Admitting: Pain Medicine

## 2022-10-05 VITALS — BP 98/75 | HR 87 | Temp 97.4°F | Resp 18 | Ht 66.0 in | Wt 155.0 lb

## 2022-10-05 DIAGNOSIS — M5459 Other low back pain: Secondary | ICD-10-CM | POA: Insufficient documentation

## 2022-10-05 DIAGNOSIS — M47816 Spondylosis without myelopathy or radiculopathy, lumbar region: Secondary | ICD-10-CM | POA: Diagnosis not present

## 2022-10-05 DIAGNOSIS — M47817 Spondylosis without myelopathy or radiculopathy, lumbosacral region: Secondary | ICD-10-CM | POA: Diagnosis not present

## 2022-10-05 DIAGNOSIS — M5136 Other intervertebral disc degeneration, lumbar region: Secondary | ICD-10-CM | POA: Diagnosis not present

## 2022-10-05 DIAGNOSIS — M545 Low back pain, unspecified: Secondary | ICD-10-CM | POA: Insufficient documentation

## 2022-10-05 DIAGNOSIS — G8929 Other chronic pain: Secondary | ICD-10-CM | POA: Diagnosis not present

## 2022-10-05 DIAGNOSIS — M48061 Spinal stenosis, lumbar region without neurogenic claudication: Secondary | ICD-10-CM | POA: Insufficient documentation

## 2022-10-05 DIAGNOSIS — R937 Abnormal findings on diagnostic imaging of other parts of musculoskeletal system: Secondary | ICD-10-CM | POA: Insufficient documentation

## 2022-10-05 DIAGNOSIS — M4316 Spondylolisthesis, lumbar region: Secondary | ICD-10-CM | POA: Diagnosis not present

## 2022-10-05 NOTE — Progress Notes (Signed)
Safety precautions to be maintained throughout the outpatient stay will include: orient to surroundings, keep bed in low position, maintain call bell within reach at all times, provide assistance with transfer out of bed and ambulation.  

## 2022-10-05 NOTE — Telephone Encounter (Signed)
Called patient to inform her that Dr Laban Emperor would no longer be prescribing Robaxin and would need to get her PCP to prescribe. Patient with understanding

## 2022-10-05 NOTE — Patient Instructions (Addendum)
 ______________________________________________________________________    Procedure instructions  Stop blood-thinners  Do not eat or drink fluids (other than water) for 6 hours before your procedure  No water for 2 hours before your procedure  Take your blood pressure medicine with a sip of water  Arrive 30 minutes before your appointment  If sedation is planned, bring suitable driver. Pennie Banter, Benedetto Goad, & public transportation are NOT APPROVED)  Carefully read the "Preparing for your procedure" detailed instructions  If you have questions call us at [phone]  ______________________________________________________________________      ______________________________________________________________________    Preparing for your procedure  Appointments: If you think you may not be able to keep your appointment, call 24-48 hours in advance to cancel. We need time to make it available to others.  During your procedure appointment there will be: No Prescription Refills. No disability issues to discussed. No medication changes or discussions.  Instructions: Food intake: Avoid eating anything solid for at least 8 hours prior to your procedure. Clear liquid intake: You may take clear liquids such as water up to 2 hours prior to your procedure. (No carbonated drinks. No soda.) Transportation: Unless otherwise stated by your physician, bring a driver. (Driver cannot be a Market researcher, Pharmacist, community, or any other form of public transportation.) Morning Medicines: Except for blood thinners, take all of your other morning medications with a sip of water. Make sure to take your heart and blood pressure medicines. If your blood pressure's lower number is above 100, the case will be rescheduled. Blood thinners: Make sure to stop your blood thinners as instructed.  If you take a blood thinner, but were not instructed to stop it, call our office [phone] and ask to talk to a nurse. Not stopping a blood  thinner prior to certain procedures could lead to serious complications. Diabetics on insulin: Notify the staff so that you can be scheduled 1st case in the morning. If your diabetes requires high dose insulin, take only  of your normal insulin dose the morning of the procedure and notify the staff that you have done so. Preventing infections: Shower with an antibacterial soap the morning of your procedure.  Build-up your immune system: Take 1000 mg of Vitamin C with every meal (3 times a day) the day prior to your procedure. Antibiotics: Inform the nursing staff if you are taking any antibiotics or if you have any conditions that may require antibiotics prior to procedures. (Example: recent joint implants)   Pregnancy: If you are pregnant make sure to notify the nursing staff. Not doing so may result in injury to the fetus, including death.  Sickness: If you have a cold, fever, or any active infections, call and cancel or reschedule your procedure. Receiving steroids while having an infection may result in complications. Arrival: You must be in the facility at least 30 minutes prior to your scheduled procedure. Tardiness: Your scheduled time is also the cutoff time. If you do not arrive at least 15 minutes prior to your procedure, you will be rescheduled.  Children: Do not bring any children with you. Make arrangements to keep them home. Dress appropriately: There is always a possibility that your clothing may get soiled. Avoid long dresses. Valuables: Do not bring any jewelry or valuables.  Reasons to call and reschedule or cancel your procedure: (Following these recommendations will minimize the risk of a serious complication.) Surgeries: Avoid having procedures within 2 weeks of any surgery. (Avoid for 2 weeks before or after any surgery). Flu Shots:  Avoid having procedures within 2 weeks of a flu shots or . (Avoid for 2 weeks before or after immunizations). Barium: Avoid having a procedure  within 7-10 days after having had a radiological study involving the use of radiological contrast. (Myelograms, Barium swallow or enema study). Heart attacks: Avoid any elective procedures or surgeries for the initial 6 months after a "Myocardial Infarction" (Heart Attack). Blood thinners: It is imperative that you stop these medications before procedures. Let us know if you if you take any blood thinner.  Infection: Avoid procedures during or within two weeks of an infection (including chest colds or gastrointestinal problems). Symptoms associated with infections include: Localized redness, fever, chills, night sweats or profuse sweating, burning sensation when voiding, cough, congestion, stuffiness, runny nose, sore throat, diarrhea, nausea, vomiting, cold or Flu symptoms, recent or current infections. It is specially important if the infection is over the area that we intend to treat. Heart and lung problems: Symptoms that may suggest an active cardiopulmonary problem include: cough, chest pain, breathing difficulties or shortness of breath, dizziness, ankle swelling, uncontrolled high or unusually low blood pressure, and/or palpitations. If you are experiencing any of these symptoms, cancel your procedure and contact your primary care physician for an evaluation.  Remember:  Regular Business hours are:  Monday to Thursday 8:00 AM to 4:00 PM  Provider's Schedule: Delano Metz, MD:  Procedure days: Tuesday and Thursday 7:30 AM to 4:00 PM  Edward Jolly, MD:  Procedure days: Monday and Wednesday 7:30 AM to 4:00 PM Last  Updated: 09/27/2022 ______________________________________________________________________      ______________________________________________________________________    General Risks and Possible Complications  Patient Responsibilities: It is important that you read this as it is part of your informed consent. It is our duty to inform you of the risks and possible  complications associated with treatments offered to you. It is your responsibility as a patient to read this and to ask questions about anything that is not clear or that you believe was not covered in this document.  Patient's Rights: You have the right to refuse treatment. You also have the right to change your mind, even after initially having agreed to have the treatment done. However, under this last option, if you wait until the last second to change your mind, you may be charged for the materials used up to that point.  Introduction: Medicine is not an Visual merchandiser. Everything in Medicine, including the lack of treatment(s), carries the potential for danger, harm, or loss (which is by definition: Risk). In Medicine, a complication is a secondary problem, condition, or disease that can aggravate an already existing one. All treatments carry the risk of possible complications. The fact that a side effects or complications occurs, does not imply that the treatment was conducted incorrectly. It must be clearly understood that these can happen even when everything is done following the highest safety standards.  No treatment: You can choose not to proceed with the proposed treatment alternative. The "PRO(s)" would include: avoiding the risk of complications associated with the therapy. The "CON(s)" would include: not getting any of the treatment benefits. These benefits fall under one of three categories: diagnostic; therapeutic; and/or palliative. Diagnostic benefits include: getting information which can ultimately lead to improvement of the disease or symptom(s). Therapeutic benefits are those associated with the successful treatment of the disease. Finally, palliative benefits are those related to the decrease of the primary symptoms, without necessarily curing the condition (example: decreasing the pain from a flare-up  of a chronic condition, such as incurable terminal cancer).  General Risks and  Complications: These are associated to most interventional treatments. They can occur alone, or in combination. They fall under one of the following six (6) categories: no benefit or worsening of symptoms; bleeding; infection; nerve damage; allergic reactions; and/or death. No benefits or worsening of symptoms: In Medicine there are no guarantees, only probabilities. No healthcare provider can ever guarantee that a medical treatment will work, they can only state the probability that it may. Furthermore, there is always the possibility that the condition may worsen, either directly, or indirectly, as a consequence of the treatment. Bleeding: This is more common if the patient is taking a blood thinner, either prescription or over the counter (example: Goody Powders, Fish oil, Aspirin, Garlic, etc.), or if suffering a condition associated with impaired coagulation (example: Hemophilia, cirrhosis of the liver, low platelet counts, etc.). However, even if you do not have one on these, it can still happen. If you have any of these conditions, or take one of these drugs, make sure to notify your treating physician. Infection: This is more common in patients with a compromised immune system, either due to disease (example: diabetes, cancer, human immunodeficiency virus [HIV], etc.), or due to medications or treatments (example: therapies used to treat cancer and rheumatological diseases). However, even if you do not have one on these, it can still happen. If you have any of these conditions, or take one of these drugs, make sure to notify your treating physician. Nerve Damage: This is more common when the treatment is an invasive one, but it can also happen with the use of medications, such as those used in the treatment of cancer. The damage can occur to small secondary nerves, or to large primary ones, such as those in the spinal cord and brain. This damage may be temporary or permanent and it may lead to  impairments that can range from temporary numbness to permanent paralysis and/or brain death. Allergic Reactions: Any time a substance or material comes in contact with our body, there is the possibility of an allergic reaction. These can range from a mild skin rash (contact dermatitis) to a severe systemic reaction (anaphylactic reaction), which can result in death. Death: In general, any medical intervention can result in death, most of the time due to an unforeseen complication. ______________________________________________________________________     ______________________________________________________________________    OTC Supplements: The following over-the-counter (OTC) supplements may be of some benefits when used in moderation in some chronic pain conditions. Note: Always consult with your primary care provider and/or pharmacist before taking any OTC medications. Ask your physician which may be beneficial for your particular condition.  Turmeric/curcumin*(anti-inflammatory) Glucosamine/chondroitin (triple strength)*(may help prevent loss of articular cartilage) Vitamin D*(may have the ability to suppress release of chemicals associated with inflammation) Moringa*(anti-inflammatory with mild analgesic effects) Melatonin (Helps reset sleep cycle. May also be helpful in neurodegenerative disorders.)  Vitamin B-12 (may help keep nerves and blood cells healthy as well as maintaining the function of the central nervous system)  Alpha-Lipoic-Acid (ALA) (antioxidant that may help with nerve health, pain, and blocking the activation of some inflammatory chemicals) (may be of benefit when used in diabetic neuropathy and metabolic syndrome)   (*Always use manufacturer's recommended dosage.)  ______________________________________________________________________

## 2022-10-27 ENCOUNTER — Ambulatory Visit: Payer: BC Managed Care – PPO | Attending: Pain Medicine | Admitting: Pain Medicine

## 2022-10-27 ENCOUNTER — Encounter: Payer: Self-pay | Admitting: Pain Medicine

## 2022-10-27 ENCOUNTER — Ambulatory Visit
Admission: RE | Admit: 2022-10-27 | Discharge: 2022-10-27 | Disposition: A | Discharge status: Home / Self Care | Payer: BC Managed Care – PPO | Source: Ambulatory Visit | Attending: Pain Medicine | Admitting: Pain Medicine

## 2022-10-27 VITALS — BP 135/98 | HR 70 | Temp 97.3°F | Resp 16 | Ht 65.0 in | Wt 160.0 lb

## 2022-10-27 DIAGNOSIS — G8929 Other chronic pain: Secondary | ICD-10-CM | POA: Insufficient documentation

## 2022-10-27 DIAGNOSIS — M5136 Other intervertebral disc degeneration, lumbar region: Secondary | ICD-10-CM | POA: Diagnosis not present

## 2022-10-27 DIAGNOSIS — M545 Low back pain, unspecified: Secondary | ICD-10-CM | POA: Diagnosis not present

## 2022-10-27 DIAGNOSIS — M47817 Spondylosis without myelopathy or radiculopathy, lumbosacral region: Secondary | ICD-10-CM | POA: Diagnosis not present

## 2022-10-27 DIAGNOSIS — M51369 Other intervertebral disc degeneration, lumbar region without mention of lumbar back pain or lower extremity pain: Secondary | ICD-10-CM

## 2022-10-27 DIAGNOSIS — M5459 Other low back pain: Secondary | ICD-10-CM

## 2022-10-27 DIAGNOSIS — M48061 Spinal stenosis, lumbar region without neurogenic claudication: Secondary | ICD-10-CM | POA: Diagnosis not present

## 2022-10-27 DIAGNOSIS — R937 Abnormal findings on diagnostic imaging of other parts of musculoskeletal system: Secondary | ICD-10-CM | POA: Diagnosis not present

## 2022-10-27 DIAGNOSIS — M4316 Spondylolisthesis, lumbar region: Secondary | ICD-10-CM

## 2022-10-27 DIAGNOSIS — M47816 Spondylosis without myelopathy or radiculopathy, lumbar region: Secondary | ICD-10-CM

## 2022-10-27 DIAGNOSIS — Z5189 Encounter for other specified aftercare: Secondary | ICD-10-CM | POA: Diagnosis not present

## 2022-10-27 MED ORDER — LIDOCAINE HCL 2 % IJ SOLN
20.0000 mL | Freq: Once | INTRAMUSCULAR | Status: AC
  Administered 2022-10-27: 400 mg

## 2022-10-27 MED ORDER — PENTAFLUOROPROP-TETRAFLUOROETH EX AERO
INHALATION_SPRAY | Freq: Once | CUTANEOUS | Status: AC
  Administered 2022-10-27: 30 via TOPICAL
  Filled 2022-10-27: qty 116

## 2022-10-27 MED ORDER — LIDOCAINE HCL (PF) 2 % IJ SOLN
INTRAMUSCULAR | Status: AC
  Filled 2022-10-27: qty 10

## 2022-10-27 MED ORDER — MIDAZOLAM HCL 2 MG/2ML IJ SOLN
0.5000 mg | Freq: Once | INTRAMUSCULAR | Status: AC
  Administered 2022-10-27: 2 mg via INTRAVENOUS
  Filled 2022-10-27: qty 2

## 2022-10-27 MED ORDER — ROPIVACAINE HCL 2 MG/ML IJ SOLN
18.0000 mL | Freq: Once | INTRAMUSCULAR | Status: AC
  Administered 2022-10-27: 18 mL via PERINEURAL
  Filled 2022-10-27: qty 20

## 2022-10-27 MED ORDER — LACTATED RINGERS IV SOLN: Freq: Once | INTRAVENOUS | Status: AC

## 2022-10-27 MED ORDER — TRIAMCINOLONE ACETONIDE 40 MG/ML IJ SUSP
80.0000 mg | Freq: Once | INTRAMUSCULAR | Status: AC
  Administered 2022-10-27: 80 mg
  Filled 2022-10-27: qty 2

## 2022-10-27 NOTE — Progress Notes (Signed)
Safety precautions to be maintained throughout the outpatient stay will include: orient to surroundings, keep bed in low position, maintain call bell within reach at all times, provide assistance with transfer out of bed and ambulation.  

## 2022-10-27 NOTE — Patient Instructions (Signed)

## 2022-10-27 NOTE — Progress Notes (Signed)
PROVIDER NOTE: Interpretation of information contained herein should be left to medically-trained personnel. Specific patient instructions are provided elsewhere under "Patient Instructions" section of medical record. This document was created in part using STT-dictation technology, any transcriptional errors that may result from this process are unintentional.  Patient: Samantha Cox Type: Established DOB: 26-May-1955 MRN: [id number] PCP: Mick Sell, MD  Service: Procedure DOS: 10/27/2022 Setting: Ambulatory Location: Ambulatory outpatient facility Delivery: Face-to-face Provider: Oswaldo Done, MD Specialty: Interventional Pain Management Specialty designation: 09 Location: Outpatient facility Ref. Prov.: Delano Metz, MD       Interventional Therapy   Procedure: Lumbar Facet, Medial Branch Block(s) #4  Laterality: Bilateral  Level: L3, L4, L5, and S1 Medial Branch Level(s). Injecting these levels blocks the L4-5 and L5-S1 lumbar facet joints. Imaging: Fluoroscopic guidance Spinal ([id number]) Anesthesia: Local anesthesia (1-2% Lidocaine) Anxiolysis: IV Versed 2.0 mg Sedation: No Sedation                       DOS: 10/27/2022 Performed by: Oswaldo Done, MD  Primary Purpose: Diagnostic/Therapeutic Indications: Low back pain severe enough to impact quality of life or function. 1. Chronic low back pain (1ry area of Pain) (Bilateral) (R>L) w/o sciatica   2. Grade 1 Anterolisthesis of lumbar spine (L4/L5) (5 mm)   3. Lumbar facet joint pain   4. Lumbar facet syndrome (Bilateral)   5. Lumbosacral facet hypertrophy (Multilevel) (Bilateral)   6. Spondylosis without myelopathy or radiculopathy, lumbosacral region   7. Spondylosis of lumbar spine (Multilevel)   8. DDD (degenerative disc disease), lumbar   9. Abnormal MRI, lumbar spine (07/28/2019)    NAS-11 Pain score:   Pre-procedure: 5 /10   Post-procedure: 0-No pain/10     Position / Prep / Materials:   Position: Prone  Prep solution: DuraPrep (Iodine Povacrylex [0.7% available iodine] and Isopropyl Alcohol, 74% w/w) Area Prepped: Posterolateral Lumbosacral Spine (Wide prep: From the lower border of the scapula down to the end of the tailbone and from flank to flank.)  Materials:  Tray: Block Needle(s):  Type: Spinal  Gauge (G): 22  Length: 3.5-in Qty: 4      H&P (Pre-op Assessment):  Samantha Cox is a 67 y.o. (year old), female patient, seen today for interventional treatment. She  has a past surgical history that includes Other surgical history. Samantha Cox has a current medication list which includes the following prescription(s): anoro ellipta, escitalopram, ibuprofen, multi-vitamin, NON FORMULARY, vitamin d (ergocalciferol), methocarbamol, and methocarbamol. Her primarily concern today is the Back Pain  Initial Vital Signs:  Pulse/HCG Rate: 70  Temp: (!) 97.3 F (36.3 C) Resp: 16 BP: 112/83 SpO2: 98 %  BMI: Estimated body mass index is 26.63 kg/m as calculated from the following:   Height as of this encounter: 5\' 5"  (1.651 m).   Weight as of this encounter: 160 lb (72.6 kg).  Risk Assessment: Allergies: Reviewed. She is allergic to crestor [rosuvastatin], simvastatin, and augmentin [amoxicillin-pot clavulanate].  Allergy Precautions: None required Coagulopathies: Reviewed. None identified.  Blood-thinner therapy: None at this time Active Infection(s): Reviewed. None identified. Samantha Cox is afebrile  Site Confirmation: Samantha Cox was asked to confirm the procedure and laterality before marking the site Procedure checklist: Completed Consent: Before the procedure and under the influence of no sedative(s), amnesic(s), or anxiolytics, the patient was informed of the treatment options, risks and possible complications. To fulfill our ethical and legal obligations, as recommended by the American Medical Association's Code of Ethics,  I have informed the patient of my  clinical impression; the nature and purpose of the treatment or procedure; the risks, benefits, and possible complications of the intervention; the alternatives, including doing nothing; the risk(s) and benefit(s) of the alternative treatment(s) or procedure(s); and the risk(s) and benefit(s) of doing nothing. The patient was provided information about the general risks and possible complications associated with the procedure. These may include, but are not limited to: failure to achieve desired goals, infection, bleeding, organ or nerve damage, allergic reactions, paralysis, and death. In addition, the patient was informed of those risks and complications associated to Spine-related procedures, such as failure to decrease pain; infection (i.e.: Meningitis, epidural or intraspinal abscess); bleeding (i.e.: epidural hematoma, subarachnoid hemorrhage, or any other type of intraspinal or peri-dural bleeding); organ or nerve damage (i.e.: Any type of peripheral nerve, nerve root, or spinal cord injury) with subsequent damage to sensory, motor, and/or autonomic systems, resulting in permanent pain, numbness, and/or weakness of one or several areas of the body; allergic reactions; (i.e.: anaphylactic reaction); and/or death. Furthermore, the patient was informed of those risks and complications associated with the medications. These include, but are not limited to: allergic reactions (i.e.: anaphylactic or anaphylactoid reaction(s)); adrenal axis suppression; blood sugar elevation that in diabetics may result in ketoacidosis or comma; water retention that in patients with history of congestive heart failure may result in shortness of breath, pulmonary edema, and decompensation with resultant heart failure; weight gain; swelling or edema; medication-induced neural toxicity; particulate matter embolism and blood vessel occlusion with resultant organ, and/or nervous system infarction; and/or aseptic necrosis of one or  more joints. Finally, the patient was informed that Medicine is not an exact science; therefore, there is also the possibility of unforeseen or unpredictable risks and/or possible complications that may result in a catastrophic outcome. The patient indicated having understood very clearly. We have given the patient no guarantees and we have made no promises. Enough time was given to the patient to ask questions, all of which were answered to the patient's satisfaction. Samantha Cox has indicated that she wanted to continue with the procedure. Attestation: I, the ordering provider, attest that I have discussed with the patient the benefits, risks, side-effects, alternatives, likelihood of achieving goals, and potential problems during recovery for the procedure that I have provided informed consent. Date  Time: 10/27/2022 10:37 AM   Pre-Procedure Preparation:  Monitoring: As per clinic protocol. Respiration, ETCO2, SpO2, BP, heart rate and rhythm monitor placed and checked for adequate function Safety Precautions: Patient was assessed for positional comfort and pressure points before starting the procedure. Time-out: I initiated and conducted the "Time-out" before starting the procedure, as per protocol. The patient was asked to participate by confirming the accuracy of the "Time Out" information. Verification of the correct person, site, and procedure were performed and confirmed by me, the nursing staff, and the patient. "Time-out" conducted as per Joint Commission's Universal Protocol (UP.01.01.01). Time: 1205 Start Time: 1205 hrs.  Description of Procedure:          Laterality: (see above) Targeted Levels: (see above)  Safety Precautions: Aspiration looking for blood return was conducted prior to all injections. At no point did we inject any substances, as a needle was being advanced. Before injecting, the patient was told to immediately notify me if she was experiencing any new onset of  "ringing in the ears, or metallic taste in the mouth". No attempts were made at seeking any paresthesias. Safe injection practices and needle  disposal techniques used. Medications properly checked for expiration dates. SDV (single dose vial) medications used. After the completion of the procedure, all disposable equipment used was discarded in the proper designated medical waste containers. Local Anesthesia: Protocol guidelines were followed. The patient was positioned over the fluoroscopy table. The area was prepped in the usual manner. The time-out was completed. The target area was identified using fluoroscopy. A 12-in long, straight, sterile hemostat was used with fluoroscopic guidance to locate the targets for each level blocked. Once located, the skin was marked with an approved surgical skin marker. Once all sites were marked, the skin (epidermis, dermis, and hypodermis), as well as deeper tissues (fat, connective tissue and muscle) were infiltrated with a small amount of a short-acting local anesthetic, loaded on a 10cc syringe with a 25G, 1.5-in  Needle. An appropriate amount of time was allowed for local anesthetics to take effect before proceeding to the next step. Local Anesthetic: Lidocaine 2.0% The unused portion of the local anesthetic was discarded in the proper designated containers. Technical description of process:   L3 Medial Branch Nerve Block (MBB): The target area for the L3 medial branch is at the junction of the postero-lateral aspect of the superior articular process and the superior, posterior, and medial edge of the transverse process of L4. Under fluoroscopic guidance, a Quincke needle was inserted until contact was made with os over the superior postero-lateral aspect of the pedicular shadow (target area). After negative aspiration for blood, 0.5 mL of the nerve block solution was injected without difficulty or complication. The needle was removed intact. L4 Medial Branch Nerve  Block (MBB): The target area for the L4 medial branch is at the junction of the postero-lateral aspect of the superior articular process and the superior, posterior, and medial edge of the transverse process of L5. Under fluoroscopic guidance, a Quincke needle was inserted until contact was made with os over the superior postero-lateral aspect of the pedicular shadow (target area). After negative aspiration for blood, 0.5 mL of the nerve block solution was injected without difficulty or complication. The needle was removed intact. L5 Medial Branch Nerve Block (MBB): The target area for the L5 medial branch is at the junction of the postero-lateral aspect of the superior articular process and the superior, posterior, and medial edge of the sacral ala. Under fluoroscopic guidance, a Quincke needle was inserted until contact was made with os over the superior postero-lateral aspect of the pedicular shadow (target area). After negative aspiration for blood, 0.5 mL of the nerve block solution was injected without difficulty or complication. The needle was removed intact. S1 Medial Branch Nerve Block (MBB): The target area for the S1 medial branch is at the posterior and inferior 6 o'clock position of the L5-S1 facet joint. Under fluoroscopic guidance, the Quincke needle inserted for the L5 MBB was redirected until contact was made with os over the inferior and postero aspect of the sacrum, at the 6 o' clock position under the L5-S1 facet joint (Target area). After negative aspiration for blood, 0.5 mL of the nerve block solution was injected without difficulty or complication. The needle was removed intact.  Once the entire procedure was completed, the treated area was cleaned, making sure to leave some of the prepping solution back to take advantage of its long term bactericidal properties.         Illustration of the posterior view of the lumbar spine and the posterior neural structures. Laminae of L2 through  S1 are labeled.  DPRL5, dorsal primary ramus of L5; DPRS1, dorsal primary ramus of S1; DPR3, dorsal primary ramus of L3; FJ, facet (zygapophyseal) joint L3-L4; I, inferior articular process of L4; LB1, lateral branch of dorsal primary ramus of L1; IAB, inferior articular branches from L3 medial branch (supplies L4-L5 facet joint); IBP, intermediate branch plexus; MB3, medial branch of dorsal primary ramus of L3; NR3, third lumbar nerve root; S, superior articular process of L5; SAB, superior articular branches from L4 (supplies L4-5 facet joint also); TP3, transverse process of L3.   Facet Joint Innervation (* possible contribution)  L1-2 T12, L1 (L2*)  Medial Branch  L2-3 L1, L2 (L3*)         "          "  L3-4 L2, L3 (L4*)         "          "  L4-5 L3, L4 (L5*)         "          "  L5-S1 L4, L5, S1          "          "    Vitals:   10/27/22 1205 10/27/22 1210 10/27/22 1214 10/27/22 1218  BP: 121/80 (!) 124/92 (!) 122/90 (!) 135/98  Pulse: 81 74 73 70  Resp: (!) 22 20 19 16   Temp:      SpO2: 100% 97% 98% 99%  Weight:      Height:         End Time: 1214 hrs.  Imaging Guidance (Spinal):          Type of Imaging Technique: Fluoroscopy Guidance (Spinal) Indication(s): Assistance in needle guidance and placement for procedures requiring needle placement in or near specific anatomical locations not easily accessible without such assistance. Exposure Time: Please see nurses notes. Contrast: None used. Fluoroscopic Guidance: I was personally present during the use of fluoroscopy. "Tunnel Vision Technique" used to obtain the best possible view of the target area. Parallax error corrected before commencing the procedure. "Direction-depth-direction" technique used to introduce the needle under continuous pulsed fluoroscopy. Once target was reached, antero-posterior, oblique, and lateral fluoroscopic projection used confirm needle placement in all planes. Images permanently stored in  EMR. Interpretation: No contrast injected. I personally interpreted the imaging intraoperatively. Adequate needle placement confirmed in multiple planes. Permanent images saved into the patient's record.  Post-operative Assessment:  Post-procedure Vital Signs:  Pulse/HCG Rate: 70  Temp: (!) 97.3 F (36.3 C) Resp: 16 BP: (!) 135/98 SpO2: 99 %  EBL: None  Complications: No immediate post-treatment complications observed by team, or reported by patient.  Note: The patient tolerated the entire procedure well. A repeat set of vitals were taken after the procedure and the patient was kept under observation following institutional policy, for this type of procedure. Post-procedural neurological assessment was performed, showing return to baseline, prior to discharge. The patient was provided with post-procedure discharge instructions, including a section on how to identify potential problems. Should any problems arise concerning this procedure, the patient was given instructions to immediately contact us, at any time, without hesitation. In any case, we plan to contact the patient by telephone for a follow-up status report regarding this interventional procedure.  Comments:  No additional relevant information.  Plan of Care (POC)  Orders:  Orders Placed This Encounter  Procedures   LUMBAR FACET(MEDIAL BRANCH NERVE BLOCK) MBNB    Scheduling Instructions:     Procedure: Lumbar  facet block (AKA.: Lumbosacral medial branch nerve block)     Side: Bilateral     Level: L4-5, L5-S1, and TBD Facets (L3, L4, L5, and S1 Medial Branch Nerves)     Sedation: Patient's choice.     Timeframe: Today    Order Specific Question:   Where will this procedure be performed?    Answer:   [facility]   DG PAIN [facility] C-ARM 1-60 MIN NO REPORT    Intraoperative interpretation by procedural physician at [facility].    Standing Status:   Standing    Number of Occurrences:   1    Order Specific  Question:   Reason for exam:    Answer:   Assistance in needle guidance and placement for procedures requiring needle placement in or near specific anatomical locations not easily accessible without such assistance.   Informed Consent Details: Physician/Practitioner Attestation; Transcribe to consent form and obtain patient signature    Nursing Order: Transcribe to consent form and obtain patient signature. Note: Always confirm laterality of pain with Samantha Cox, before procedure.    Order Specific Question:   Physician/Practitioner attestation of informed consent for procedure/surgical case    Answer:   I, the physician/practitioner, attest that I have discussed with the patient the benefits, risks, side effects, alternatives, likelihood of achieving goals and potential problems during recovery for the procedure that I have provided informed consent.    Order Specific Question:   Procedure    Answer:   Lumbar Facet Block  under fluoroscopic guidance    Order Specific Question:   Physician/Practitioner performing the procedure    Answer:   Excell Neyland A. Laban Emperor MD    Order Specific Question:   Indication/Reason    Answer:   Low Back Pain, with our without leg pain, due to Facet Joint Arthralgia (Joint Pain) Spondylosis (Arthritis of the Spine), without myelopathy or radiculopathy (Nerve Damage).   Provide equipment / supplies at bedside    Procedure tray: "Block Tray" (Disposable  single use) Skin infiltration needle: Regular 1.5-in, 25-G, (x1) Block Needle type: Spinal Amount/quantity: 4 Size: Regular (3.5-inch) Gauge: 22G    Standing Status:   Standing    Number of Occurrences:   1    Order Specific Question:   Specify    Answer:   Block Tray   Chronic Opioid Analgesic:  No chronic opioid analgesics therapy prescribed by our practice. None. (05/31/2021) Abnormal UDS (+) Carboxy-THC MME/day: 0 mg/day   Medications ordered for procedure: Meds ordered this encounter  Medications    lidocaine (XYLOCAINE) 2 % (with pres) injection 400 mg   pentafluoroprop-tetrafluoroeth (GEBAUERS) aerosol   lactated ringers infusion   midazolam (VERSED) injection 0.5-2 mg    Make sure Flumazenil is available in the pyxis when using this medication. If oversedation occurs, administer 0.2 mg IV over 15 sec. If after 45 sec no response, administer 0.2 mg again over 1 min; may repeat at 1 min intervals; not to exceed 4 doses (1 mg)   ropivacaine (PF) 2 mg/mL (0.2%) (NAROPIN) injection 18 mL   triamcinolone acetonide (KENALOG-40) injection 80 mg   Medications administered: We administered lidocaine, pentafluoroprop-tetrafluoroeth, lactated ringers, midazolam, ropivacaine (PF) 2 mg/mL (0.2%), and triamcinolone acetonide.  See the medical record for exact dosing, route, and time of administration.  Follow-up plan:   Return in about 2 weeks (around 11/10/2022) for (Face2F), (PPE).       Interventional Therapies  Risk Factors  Considerations:   (05/31/2021) Abnormal UDS (+)  Carboxy-THC ([place])  COPD   POOR RFA Candidate. Non-compliant w/ instructions not to move.    Planned  Pending:   Diagnostic bilateral lumbar facet MBB (L3-S1) #4    Under consideration:   Diagnostic/therapeutic bilateral L3 and L4 TFESI #1  Therapeutic bilateral lumbar facet MBB (L4-5, L5-S1) #4  NO RFA - unable to stay still    Completed:  (Non-compliant: Constant movement)  Diagnostic bilateral lumbar facet MBB (L3-S1) x3 (04/05/2022) (100/100/90/90) (100% LEP relief)  Diagnostic left SI joint Blk x2 (07/19/2022) (100/100/90/90) (100% LEP relief)    Completed by other providers:   ([place] [facility])  [place] (Ultrasound-guided) right greater trochanteric inj. x1 by Dr. [name] (02/04/2020)  [place] right greater trochanteric bursa inj. x3 by Dr. [name] (11/25/2014; 07/10/2018; 10/19/2018)  [place] right SI joint inj. x1 by Dr. Rodolph Bong (06/05/2019)  [place] left gluteus medius insertion greater trochanteric  inj. x1 by Dr. [name] (01/29/2019)  [place] left piriformis tendon sheath inj. x3 by Dr. [name] (12/02/2016; 08/23/2017; 02/15/2018)  [place] left foot neuroma inj. x1 (2 neuromas between 3rd & 4th, and 4th & 5th toes) by Dr. [name] (07/07/2016)  [place] right hip joint inj. x1 by Dr. [name] (12/04/2014)  Diagnostic bilateral L5 TFESI x1 (03/12/2021) by Filomena Jungling, MD (PMR) [facility])  Diagnostic left L5 dorsal ramus and S1, S2, and S3 lateral branch Blk x1, by Erick Colace, MD (09/10/2019)  Therapeutic left L5 dorsal ramus and S1, S2, and S3 lateral branch RFA x2, by Erick Colace, MD (10/04/2019 & 04/10/2020) (1st provide her with 50% relief of the pain.  2nd made things worse)   Therapeutic  Palliative (PRN) options:   Therapeutic/palliative lumbar facet MBB         Recent Visits Date Type Provider Dept  10/05/22 Office Visit Delano Metz, MD [facility]  08/04/22 Office Visit Delano Metz, MD [facility]  Showing recent visits within past 90 days and meeting all other requirements Today's Visits Date Type Provider Dept  10/27/22 Procedure visit Delano Metz, MD [facility]  Showing today's visits and meeting all other requirements Future Appointments Date Type Provider Dept  11/15/22 Appointment Delano Metz, MD [facility]  Showing future appointments within next 90 days and meeting all other requirements  Disposition: Discharge home  Discharge (Date  Time): 10/27/2022; 1220 hrs.   Primary Care Physician: Mick Sell, MD Location: [facility] Note by: Oswaldo Done, MD (TTS technology used. I apologize for any typographical errors that were not detected and corrected.) Date: 10/27/2022; Time: 12:50 PM  Disclaimer:  Medicine is not an Visual merchandiser. The only guarantee in medicine is that nothing is guaranteed. It is important to note that the  decision to proceed with this intervention was based on the information collected from the patient. The Data and conclusions were drawn from the patient's questionnaire, the interview, and the physical examination. Because the information was provided in large part by the patient, it cannot be guaranteed that it has not been purposely or unconsciously manipulated. Every effort has been made to obtain as much relevant data as possible for this evaluation. It is important to note that the conclusions that lead to this procedure are derived in large part from the available data. Always take into account that the treatment will also be dependent on availability of resources and existing treatment guidelines, considered by other Pain Management Practitioners as being common knowledge and practice,  at the time of the intervention. For Medico-Legal purposes, it is also important to point out that variation in procedural techniques and pharmacological choices are the acceptable norm. The indications, contraindications, technique, and results of the above procedure should only be interpreted and judged by a Board-Certified Interventional Pain Specialist with extensive familiarity and expertise in the same exact procedure and technique.

## 2022-10-28 ENCOUNTER — Telehealth: Payer: Self-pay

## 2022-10-28 NOTE — Telephone Encounter (Signed)
Post procedure follow up.  LM

## 2022-11-08 DIAGNOSIS — N8110 Cystocele, unspecified: Secondary | ICD-10-CM | POA: Diagnosis not present

## 2022-11-08 DIAGNOSIS — Z01419 Encounter for gynecological examination (general) (routine) without abnormal findings: Secondary | ICD-10-CM | POA: Diagnosis not present

## 2022-11-08 DIAGNOSIS — R3911 Hesitancy of micturition: Secondary | ICD-10-CM | POA: Diagnosis not present

## 2022-11-08 DIAGNOSIS — N3941 Urge incontinence: Secondary | ICD-10-CM | POA: Diagnosis not present

## 2022-11-09 DIAGNOSIS — Z7184 Encounter for health counseling related to travel: Secondary | ICD-10-CM | POA: Diagnosis not present

## 2022-11-09 DIAGNOSIS — Z09 Encounter for follow-up examination after completed treatment for conditions other than malignant neoplasm: Secondary | ICD-10-CM | POA: Diagnosis not present

## 2022-11-14 NOTE — Progress Notes (Unsigned)
PROVIDER NOTE: Information contained herein reflects review and annotations entered in association with encounter. Interpretation of such information and data should be left to medically-trained personnel. Information provided to patient can be located elsewhere in the medical record under "Patient Instructions". Document created using STT-dictation technology, any transcriptional errors that may result from process are unintentional.    Patient: Samantha Cox  Service Category: E/M  Provider: Oswaldo Done, MD  DOB: 02-24-1955  DOS: 11/15/2022  Referring Provider: Mick Sell, MD  MRN: [id number]  Specialty: Interventional Pain Management  PCP: Mick Sell, MD  Type: Established Patient  Setting: Ambulatory outpatient    Location: Office  Delivery: Face-to-face     HPI  [name], a 67 y.o. year old female, is here today because of her No primary diagnosis found.. Ms. Palley primary complain today is No chief complaint on file.  Pertinent problems: Ms. Lafazia has Greater trochanteric bursitis of hip (Left); Piriformis syndrome of left side; Morton's neuroma of foot (Left); Leg cramping; Degenerative lumbar spinal stenosis; Greater trochanteric bursitis of hip (Right); Facet arthritis of lumbar region; Chronic SI joint pain (Left); Hamstring tendinitis at origin; Myalgia due to statin; Cervical intraepithelial neoplasia grade 1; Chronic back pain; Degeneration of lumbar intervertebral disc; Enthesopathy of hip region (Right); Metatarsalgia of foot (Left); Pain in foot (Left); Plantar fasciitis of foot (Left); Chronic pain syndrome; Abnormal MRI, lumbar spine (07/28/2019); Cervicalgia (4th area of Pain); Chronic low back pain (1ry area of Pain) (Bilateral) (R>L) w/o sciatica; Lumbar facet syndrome (Bilateral); Lumbosacral facet hypertrophy (Multilevel) (Bilateral); Chronic lower extremity pain (3ry area of Pain) (nonradicular) (Bilateral) (L>R); Cervicogenic  headache (6th area of Pain); Chronic shoulder pain (5th area of Pain) (Bilateral); Chronic hip pain (2ry area of Pain) (Bilateral) (R>L); DDD (degenerative disc disease), lumbar; Osteoarthritis of acromioclavicular joints (Bilateral); Spondylosis of lumbar spine (Multilevel); Lumbar lateral recess stenosis (Left: L2-3, L3-4) (Right: L2-3, L4-5); Lumbar foraminal stenosis (Left: L2-3, L3-4, L4-5) (Right: L3-4, L4-5); Dextroscoliosis of lumbar spine (apex at L3-4); Grade 1 Anterolisthesis of lumbar spine (L4/L5) (5 mm); Osteoarthritis of hips (Bilateral) (L>R); Tendinopathy of gluteus medius (Right); Proximal hamstring tendon low-grade tear, sequela (Right); Superior labral tear (low-grade, partial-thickness) of hip joint, sequela (Right); Spondylosis without myelopathy or radiculopathy, lumbosacral region; Chronic musculoskeletal pain; Other spondylosis, sacral and sacrococcygeal region; Greater trochanteric bursitis (Bilateral); Sacroiliac joint dysfunction (Left); Somatic dysfunction of sacroiliac joint (Left); and Lumbar facet joint pain on their pertinent problem list. Pain Assessment: Severity of   is reported as a  /10. Location:    / . Onset:  . Quality:  . Timing:  . Modifying factor(s):  Marland Kitchen Vitals:  vitals were not taken for this visit.  BMI: Estimated body mass index is 26.63 kg/m as calculated from the following:   Height as of 10/27/22: 5\' 5"  (1.651 m).   Weight as of 10/27/22: 160 lb (72.6 kg). Last encounter: 10/05/2022. Last procedure: 10/27/2022.  Reason for encounter: post-procedure evaluation and assessment. ***  Post-procedure evaluation   Procedure: Lumbar Facet, Medial Branch Block(s) #4  Laterality: Bilateral  Level: L3, L4, L5, and S1 Medial Branch Level(s). Injecting these levels blocks the L4-5 and L5-S1 lumbar facet joints. Imaging: Fluoroscopic guidance Spinal ([id number]) Anesthesia: Local anesthesia (1-2% Lidocaine) Anxiolysis: IV Versed 2.0 mg Sedation: No Sedation                        DOS: 10/27/2022 Performed by: Oswaldo Done, MD  Primary Purpose:  Diagnostic/Therapeutic Indications: Low back pain severe enough to impact quality of life or function. 1. Chronic low back pain (1ry area of Pain) (Bilateral) (R>L) w/o sciatica   2. Grade 1 Anterolisthesis of lumbar spine (L4/L5) (5 mm)   3. Lumbar facet joint pain   4. Lumbar facet syndrome (Bilateral)   5. Lumbosacral facet hypertrophy (Multilevel) (Bilateral)   6. Spondylosis without myelopathy or radiculopathy, lumbosacral region   7. Spondylosis of lumbar spine (Multilevel)   8. DDD (degenerative disc disease), lumbar   9. Abnormal MRI, lumbar spine (07/28/2019)    NAS-11 Pain score:   Pre-procedure: 5 /10   Post-procedure: 0-No pain/10      Effectiveness:  Initial hour after procedure:   ***. Subsequent 4-6 hours post-procedure:   ***. Analgesia past initial 6 hours:   ***. Ongoing improvement:  Analgesic:  *** Function:    ***    ROM:    ***     Pharmacotherapy Assessment  Analgesic: No chronic opioid analgesics therapy prescribed by our practice. None. (05/31/2021) Abnormal UDS (+) Carboxy-THC MME/day: 0 mg/day   Monitoring: [place] PMP: PDMP reviewed during this encounter.       Pharmacotherapy: No side-effects or adverse reactions reported. Compliance: No problems identified. Effectiveness: Clinically acceptable.  No notes on file  CBD:THC Ratio  Date Value Ref Range Status  05/31/2021 Comment RATIO Final    Comment:    INTERFERENCE Unable to complete testing due to unknown interference.  This test measures Cannabidiol (CBD) and Tetrahydrocannabinol (THC) and metabolites in urine. The CBD:THC ratio is calculated using the sums of the respective metabolites and is intended to assist in differentiating the presence of Tetrahydrocannabinol [facility]) metabolites due to the use of marijuana or medicinal THC from the presence of THC metabolites due to use of Cannabidiol (CBD) or  hemp products that purportedly contain trace amounts of THC.  CBD:THC Ratio          Interpretation ---------      --------------------------------------------- >=10.0         Consistent with the use of CBD products only 1.0 - 9.9      Indeterminate <1.0           Consistent with marijuana, medicinal THC or                mixed use  Interpretive ranges are provided as guidance and should not be considered definitive. Interpretation of results should include consideration of all relevant clinical and diagnostic information.  Analysis performed by chromatography with mass spectrometry. This test was developed and its performance characteristics determined by [facility].  It has not been cleared or approved by the Food and Drug Administration.    Carboxy-Delta-8-THC  Date Value Ref Range Status  05/31/2021 >500 ng/mL Final   Carboxy-Delta-9-THC  Date Value Ref Range Status  05/31/2021 117.4 ng/mL Final    Comment:    Carboxy-Delta-9-THC is the primary metabolite of Delta-9- Tetrahydrocannabinol. Sources include the prescription medication Dronabinol as well as illicit, recreational, and medical marijuana and marijuana derived products of the same categories.  Carboxy-Delta-8-THC is the primary metabolite of Delta-8- Tetrahydrocannabinol. Sources are products containing Delta- 8-THC, which is primarily chemically manufactured from cannabidiol (CBD).  Testing Threshold = 2.0 ng/mL  Analysis performed by Liquid Chromatography with Tandem Mass Spectrometry (LC/MS/MS).  This test was developed and its performance characteristics determined by [facility].  It has not been cleared or approved by the Food and Drug Administration.     UDS:  Summary  Date  Value Ref Range Status  05/31/2021 Note  Final    Comment:    ==================================================================== Compliance Drug Analysis,  [place] ==================================================================== Test                             Result       Flag       Units  Drug Present and Declared for Prescription Verification   Acetaminophen                  PRESENT      EXPECTED   Ibuprofen                      PRESENT      EXPECTED  Drug Present not Declared for Prescription Verification   Carboxy-THC                    997          UNEXPECTED ng/mg creat    Carboxy-THC is a metabolite of tetrahydrocannabinol (THC). Source of    THC is most commonly herbal marijuana or marijuana-based products,    but THC is also present in a scheduled prescription medication.    Trace amounts of THC can be present in hemp and cannabidiol (CBD)    products. This test is not intended to distinguish between delta-9-    tetrahydrocannabinol, the predominant form of THC in most herbal or    marijuana-based products, and delta-8-tetrahydrocannabinol.  Drug Absent but Declared for Prescription Verification   Methocarbamol                  Not Detected UNEXPECTED   Salicylate                     Not Detected UNEXPECTED    Aspirin, as indicated in the declared medication list, is not always    detected even when used as directed.  ==================================================================== Test                      Result    Flag   Units      Ref Range   Creatinine              75               mg/dL      >=78 ==================================================================== Declared Medications:  The flagging and interpretation on this report are based on the  following declared medications.  Unexpected results may arise from  inaccuracies in the declared medications.   **Note: The testing scope of this panel includes these medications:   Methocarbamol (Robaxin)   **Note: The testing scope of this panel does not include small to  moderate amounts of these reported medications:   Acetaminophen (Tylenol)  Aspirin   Ibuprofen (Advil)   **Note: The testing scope of this panel does not include the  following reported medications:   Calcium (Tums)  Multivitamin  Umeclidinium (Anoro)  Vilanterol (Anoro)  Vitamin D2 (Drisdol) ==================================================================== For clinical consultation, please call [phone]. ====================================================================       ROS  Constitutional: Denies any fever or chills Gastrointestinal: No reported hemesis, hematochezia, vomiting, or acute GI distress Musculoskeletal: Denies any acute onset joint swelling, redness, loss of ROM, or weakness Neurological: No reported episodes of acute onset apraxia, aphasia, dysarthria, agnosia, amnesia, paralysis, loss of coordination, or loss of consciousness  Medication Review  Multi-Vitamin,  NON FORMULARY, Vitamin D (Ergocalciferol), escitalopram, ibuprofen, methocarbamol, and umeclidinium-vilanterol  History Review  Allergy: Ms. Curl is allergic to crestor [rosuvastatin], simvastatin, and augmentin [amoxicillin-pot clavulanate]. Drug: Ms. Metzer  reports no history of drug use. Alcohol:  reports current alcohol use of about 1.0 standard drink of alcohol per week. Tobacco:  reports that she has been smoking e-cigarettes. She uses smokeless tobacco. Social: Ms. Cuadra  reports that she has been smoking e-cigarettes. She uses smokeless tobacco. She reports current alcohol use of about 1.0 standard drink of alcohol per week. She reports that she does not use drugs. Medical:  has a past medical history of History of tobacco use, Routine general medical examination at a health care facility, and Screening for lipoid disorders. Surgical: Ms. Sweitzer  has a past surgical history that includes Other surgical history. Family: family history includes Asthma in her mother; Atrial fibrillation in her father; Bladder Cancer in her father; CVA (age of onset: 55) in  her father; Diverticulitis in her brother; Hypertension in her mother; Lung cancer in her brother.  Laboratory Chemistry Profile   Renal Lab Results  Component Value Date   BUN 25 (H) 05/31/2021   CREATININE 0.68 05/31/2021   GFR 84.17 09/20/2016   GFRAA >60 01/16/2019   GFRNONAA >60 05/31/2021    Hepatic Lab Results  Component Value Date   AST 28 05/31/2021   ALT 29 05/31/2021   ALBUMIN 3.8 05/31/2021   ALKPHOS 79 05/31/2021    Electrolytes Lab Results  Component Value Date   NA 139 05/31/2021   K 4.2 05/31/2021   CL 107 05/31/2021   CALCIUM 9.4 05/31/2021   MG 2.3 05/31/2021    Bone Lab Results  Component Value Date   VD25OH 32.18 09/20/2016   25OHVITD1 69 05/31/2021   25OHVITD2 36 05/31/2021   25OHVITD3 33 05/31/2021    Inflammation (CRP: Acute Phase) (ESR: Chronic Phase) Lab Results  Component Value Date   CRP 0.7 05/31/2021   ESRSEDRATE 5 05/31/2021         Note: Above Lab results reviewed.  Recent Imaging Review  DG PAIN CLINIC C-ARM 1-60 MIN NO REPORT Fluoro was used, but no Radiologist interpretation will be provided.  Please refer to "NOTES" tab for provider progress note. Note: Reviewed        Physical Exam  General appearance: Well nourished, well developed, and well hydrated. In no apparent acute distress Mental status: Alert, oriented x 3 (person, place, & time)       Respiratory: No evidence of acute respiratory distress Eyes: PERLA Vitals: There were no vitals taken for this visit. BMI: Estimated body mass index is 26.63 kg/m as calculated from the following:   Height as of 10/27/22: 5\' 5"  (1.651 m).   Weight as of 10/27/22: 160 lb (72.6 kg). Ideal: Patient weight not recorded  Assessment   Diagnosis Status  No diagnosis found. Controlled Controlled Controlled   Updated Problems: No problems updated.  Plan of Care  Problem-specific:  No problem-specific Assessment & Plan notes found for this encounter.  Ms. JOSELYNN RICHMEIER  has a current medication list which includes the following long-term medication(s): escitalopram, methocarbamol, and methocarbamol.  Pharmacotherapy (Medications Ordered): No orders of the defined types were placed in this encounter.  Orders:  No orders of the defined types were placed in this encounter.  Follow-up plan:   No follow-ups on file.      Interventional Therapies  Risk Factors  Considerations:   (05/31/2021) Abnormal UDS (+)  Carboxy-THC ([place])  COPD   POOR RFA Candidate. Non-compliant w/ instructions not to move.    Planned  Pending:   Diagnostic bilateral lumbar facet MBB (L3-S1) #4    Under consideration:   Diagnostic/therapeutic bilateral L3 and L4 TFESI #1  Therapeutic bilateral lumbar facet MBB (L4-5, L5-S1) #4  NO RFA - unable to stay still    Completed:  (Non-compliant: Constant movement)  Diagnostic bilateral lumbar facet MBB (L3-S1) x3 (04/05/2022) (100/100/90/90) (100% LEP relief)  Diagnostic left SI joint Blk x2 (07/19/2022) (100/100/90/90) (100% LEP relief)    Completed by other providers:   ([facility])  [place] (Ultrasound-guided) right greater trochanteric inj. x1 by Dr. [name] (02/04/2020)  [place] right greater trochanteric bursa inj. x3 by Dr. [name] (11/25/2014; 07/10/2018; 10/19/2018)  [place] right SI joint inj. x1 by Dr. Rodolph Bong (06/05/2019)  [place] left gluteus medius insertion greater trochanteric inj. x1 by Dr. [name] (01/29/2019)  [place] left piriformis tendon sheath inj. x3 by Dr. [name] (12/02/2016; 08/23/2017; 02/15/2018)  [place] left foot neuroma inj. x1 (2 neuromas between 3rd & 4th, and 4th & 5th toes) by Dr. [name] (07/07/2016)  [place] right hip joint inj. x1 by Dr. [name] (12/04/2014)  Diagnostic bilateral L5 TFESI x1 (03/12/2021) by Filomena Jungling, MD (PMR) [facility])  Diagnostic left L5 dorsal ramus and S1, S2, and S3 lateral branch Blk x1, by Erick Colace, MD (09/10/2019)  Therapeutic left L5 dorsal  ramus and S1, S2, and S3 lateral branch RFA x2, by Erick Colace, MD (10/04/2019 & 04/10/2020) (1st provide her with 50% relief of the pain.  2nd made things worse)   Therapeutic  Palliative (PRN) options:   Therapeutic/palliative lumbar facet MBB          Recent Visits Date Type Provider Dept  10/27/22 Procedure visit Delano Metz, MD [facility]  10/05/22 Office Visit Delano Metz, MD [facility]  Showing recent visits within past 90 days and meeting all other requirements Future Appointments Date Type Provider Dept  11/15/22 Appointment Delano Metz, MD [facility]  Showing future appointments within next 90 days and meeting all other requirements  I discussed the assessment and treatment plan with the patient. The patient was provided an opportunity to ask questions and all were answered. The patient agreed with the plan and demonstrated an understanding of the instructions.  Patient advised to call back or seek an in-person evaluation if the symptoms or condition worsens.  Duration of encounter: *** minutes.  Total time on encounter, as per AMA guidelines included both the face-to-face and non-face-to-face time personally spent by the physician and/or other qualified health care professional(s) on the day of the encounter (includes time in activities that require the physician or other qualified health care professional and does not include time in activities normally performed by clinical staff). Physician's time may include the following activities when performed: Preparing to see the patient (e.g., pre-charting review of records, searching for previously ordered imaging, lab work, and nerve conduction tests) Review of prior analgesic pharmacotherapies. Reviewing PMP Interpreting ordered tests (e.g., lab work, imaging, nerve conduction tests) Performing post-procedure evaluations, including interpretation of diagnostic  procedures Obtaining and/or reviewing separately obtained history Performing a medically appropriate examination and/or evaluation Counseling and educating the patient/family/caregiver Ordering medications, tests, or procedures Referring and communicating with other health care professionals (when not separately reported) Documenting clinical information in the electronic or other health record Independently interpreting results (not separately reported) and communicating  results to the patient/ family/caregiver Care coordination (not separately reported)  Note by: Oswaldo Done, MD Date: 11/15/2022; Time: 12:41 PM

## 2022-11-15 ENCOUNTER — Ambulatory Visit (HOSPITAL_BASED_OUTPATIENT_CLINIC_OR_DEPARTMENT_OTHER): Payer: BC Managed Care – PPO | Admitting: Pain Medicine

## 2022-11-15 DIAGNOSIS — Z91199 Patient's noncompliance with other medical treatment and regimen due to unspecified reason: Secondary | ICD-10-CM

## 2022-11-15 DIAGNOSIS — Z09 Encounter for follow-up examination after completed treatment for conditions other than malignant neoplasm: Secondary | ICD-10-CM

## 2023-04-17 ENCOUNTER — Ambulatory Visit: Admitting: Pain Medicine

## 2023-04-18 NOTE — Progress Notes (Addendum)
 PROVIDER NOTE: Information contained herein reflects review and annotations entered in association with encounter. Interpretation of such information and data should be left to medically-trained personnel. Information provided to patient can be located elsewhere in the medical record under "Patient Instructions". Document created using STT-dictation technology, any transcriptional errors that may result from process are unintentional.    Patient: Samantha Cox  Service Category: E/M  Provider: Oswaldo Done, MD  DOB: 01-Aug-1955  DOS: 04/19/2023  Referring Provider: Mick Sell, MD  MRN: [id number]  Specialty: Interventional Pain Management  PCP: Samantha Sell, MD  Type: Established Patient  Setting: Ambulatory outpatient    Location: Office  Delivery: Face-to-face     HPI  [name], a 68 y.o. year old female, is here today because of her Chronic bilateral low back pain without sciatica [M54.50, G89.29]. [name] primary complain today is Back Pain (lower)  Pertinent problems: [name] has Greater trochanteric bursitis of hip (Left); Piriformis syndrome of left side; Morton's neuroma of foot (Left); Leg cramping; Degenerative lumbar spinal stenosis; Greater trochanteric bursitis of hip (Right); Facet arthritis of lumbar region; Chronic SI joint pain (Left); Hamstring tendinitis at origin; Myalgia due to statin; Cervical intraepithelial neoplasia grade 1; Chronic back pain; Degeneration of lumbar intervertebral disc; Enthesopathy of hip region (Right); Metatarsalgia of foot (Left); Pain in foot (Left); Plantar fasciitis of foot (Left); Chronic pain syndrome; Abnormal MRI, lumbar spine (07/28/2019); Cervicalgia (4th area of Pain); Chronic low back pain (1ry area of Pain) (Bilateral) (R>L) w/o sciatica; Lumbar facet syndrome (Bilateral); Lumbosacral facet hypertrophy (Multilevel) (Bilateral); Chronic lower extremity pain (3ry area of Pain) (nonradicular)  (Bilateral) (L>R); Cervicogenic headache (6th area of Pain); Chronic shoulder pain (5th area of Pain) (Bilateral); Chronic hip pain (2ry area of Pain) (Bilateral) (R>L); DDD (degenerative disc disease), lumbar; Osteoarthritis of acromioclavicular joints (Bilateral); Spondylosis of lumbar spine (Multilevel); Lumbar lateral recess stenosis (Left: L2-3, L3-4) (Right: L2-3, L4-5); Lumbar foraminal stenosis (Left: L2-3, L3-4, L4-5) (Right: L3-4, L4-5); Dextroscoliosis of lumbar spine (apex at L3-4); Grade 1 Anterolisthesis of lumbar spine (L4/L5) (5 mm); Osteoarthritis of hips (Bilateral) (L>R); Tendinopathy of gluteus medius (Right); Proximal hamstring tendon low-grade tear, sequela (Right); Superior labral tear (low-grade, partial-thickness) of hip joint, sequela (Right); Spondylosis without myelopathy or radiculopathy, lumbosacral region; Chronic musculoskeletal pain; Other spondylosis, sacral and sacrococcygeal region; Greater trochanteric bursitis (Bilateral); Sacroiliac joint dysfunction (Left); Somatic dysfunction of sacroiliac joint (Left); and Lumbar facet joint pain on their pertinent problem list. Pain Assessment: Severity of Chronic pain is reported as a 4 /10. Location: Back Lower/down back of legs to toes, LEFT side is worse. Onset: More than a month ago. Quality: Aching. Timing: Constant. Modifying factor(s): procedures, ADVIL BID. Vitals:  height is 5\' 6"  (1.676 m) and weight is 165 lb (74.8 kg). Her temperature is 96.4 F (35.8 C) (abnormal). Her blood pressure is 119/75 and her pulse is 77. Her respiration is 16 and oxygen saturation is 98%.  BMI: Estimated body mass index is 26.63 kg/m as calculated from the following:   Height as of this encounter: 5\' 6"  (1.676 m).   Weight as of this encounter: 165 lb (74.8 kg). Last encounter: 11/15/2022. Last procedure: 10/27/2022.  Therapeutic bilateral lumbar facet MBB.  Reason for encounter: post-procedure evaluation and assessment.  The patient had an  appointment for follow-up evaluation after the 10/27/2022 bilateral lumbar facet block which she did not keep.  Discussed the use of AI scribe software for clinical note transcription with the patient,  who gave verbal consent to proceed.  History of Present Illness   The patient presents with worsening low back pain.  She experiences worsening low back pain, particularly severe in the morning and at the end of the day. The pain is more pronounced on the left side but affects both sides and is now located higher than before, across both sides of her lower back. Movement and Advil provide some relief.  She recalls undergoing two sets of injections last year, one in February targeting the left sacroiliac joint and another in August targeting the facet joints. Both provided significant relief, with the February injection described as 'life changing' and the August injection allowing her to function well and walk without significant pain.  Currently, she takes two tablets of Advil twice a day for pain management. She is concerned about the long-term effects of steroid injections, particularly regarding potential degradation of soft tissues.  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 She is aware of the need to avoid activities that exacerbate her condition, such as lifting and twisting.  No use of blood thinners.      Post-procedure evaluation   Procedure: Lumbar Facet, Medial Branch Block(s) #4  Laterality: Bilateral  Level: L3, L4, L5, and S1 Medial Branch Level(s). Injecting these levels blocks the L4-5 and L5-S1 lumbar facet joints. Imaging: Fluoroscopic guidance Spinal ([id number]) Anesthesia: Local anesthesia (1-2% Lidocaine) Anxiolysis: IV Versed 2.0 mg Sedation: No Sedation                       DOS: 10/27/2022 Performed by: Oswaldo Done, MD  Primary Purpose: Diagnostic/Therapeutic Indications: Low back  pain severe enough to impact quality of life or function. 1. Chronic low back pain (1ry area of Pain) (Bilateral) (R>L) w/o sciatica   2. Grade 1 Anterolisthesis of lumbar spine (L4/L5) (5 mm)   3. Lumbar facet joint pain   4. Lumbar facet syndrome (Bilateral)   5. Lumbosacral facet hypertrophy (Multilevel) (Bilateral)   6. Spondylosis without myelopathy or radiculopathy, lumbosacral region   7. Spondylosis of lumbar spine (Multilevel)   8. DDD (degenerative disc disease), lumbar   9. Abnormal MRI, lumbar spine (07/28/2019)    NAS-11 Pain score:   Pre-procedure: 5 /10   Post-procedure: 0-No pain/10     Effectiveness:  Initial hour after procedure: 100 %. Subsequent 4-6 hours post-procedure: 100 %. Analgesia past initial 6 hours: 90 % (X 5 months). Ongoing improvement:  Analgesic: The patient indicates having attained 100% relief in pain from additional local anesthetic followed by 90% improvement of her pain that lasted for approximately 5 months.  She indicates that the pain is beginning to come back now and would like to have that procedure repeated. Function: Ms. Millay reports improvement in function ROM: Ms. Stopka reports improvement in ROM   Pharmacotherapy Assessment  Analgesic: No chronic opioid analgesics therapy prescribed by our practice. None. (05/31/2021) Abnormal UDS (+) Carboxy-THC MME/day: 0 mg/day   Monitoring: [place] PMP: PDMP reviewed during this encounter.       Pharmacotherapy: No side-effects or adverse reactions reported. Compliance: No problems identified. Effectiveness: Clinically acceptable.  Nonah Mattes, RN  04/19/2023 10:51 AM  Sign when Signing Visit Safety precautions to be maintained throughout the outpatient stay will include: orient to surroundings, keep bed in low position, maintain call bell within reach at all times, provide assistance with transfer out of bed and ambulation.     CBD:THC Ratio  Date Value Ref Range Status  05/31/2021  Comment RATIO Final    Comment:    INTERFERENCE Unable to complete testing due to unknown interference.  This test measures Cannabidiol (CBD) and Tetrahydrocannabinol (THC) and metabolites in urine. The CBD:THC ratio is calculated using the sums of the respective metabolites and is intended to assist in differentiating the presence of Tetrahydrocannabinol [facility]) metabolites due to the use of marijuana or medicinal THC from the presence of THC metabolites due to use of Cannabidiol (CBD) or hemp products that purportedly contain trace amounts of THC.  CBD:THC Ratio          Interpretation ---------      --------------------------------------------- >=10.0         Consistent with the use of CBD products only 1.0 - 9.9      Indeterminate <1.0           Consistent with marijuana, medicinal THC or                mixed use  Interpretive ranges are provided as guidance and should not be considered definitive. Interpretation of results should include consideration of all relevant clinical and diagnostic information.  Analysis performed by chromatography with mass spectrometry. This test was developed and its performance characteristics determined by [facility].  It has not been cleared or approved by the Food and Drug Administration.    Carboxy-Delta-8-THC  Date Value Ref Range Status  05/31/2021 >500 ng/mL Final   Carboxy-Delta-9-THC  Date Value Ref Range Status  05/31/2021 117.4 ng/mL Final    Comment:    Carboxy-Delta-9-THC is the primary metabolite of Delta-9- Tetrahydrocannabinol. Sources include the prescription medication Dronabinol as well as illicit, recreational, and medical marijuana and marijuana derived products of the same categories.  Carboxy-Delta-8-THC is the primary metabolite of Delta-8- Tetrahydrocannabinol. Sources are products containing Delta- 8-THC, which is primarily chemically manufactured from cannabidiol (CBD).  Testing Threshold = 2.0  ng/mL  Analysis performed by Liquid Chromatography with Tandem Mass Spectrometry (LC/MS/MS).  This test was developed and its performance characteristics determined by [facility].  It has not been cleared or approved by the Food and Drug Administration.     UDS:  Summary  Date Value Ref Range Status  05/31/2021 Note  Final    Comment:    ==================================================================== Compliance Drug Analysis, Ur ==================================================================== Test                             Result       Flag       Units  Drug Present and Declared for Prescription Verification   Acetaminophen                  PRESENT      EXPECTED   Ibuprofen                      PRESENT      EXPECTED  Drug Present not Declared for Prescription Verification   Carboxy-THC                    997          UNEXPECTED ng/mg creat    Carboxy-THC is a metabolite of tetrahydrocannabinol (THC). Source of    THC is most commonly herbal marijuana or marijuana-based products,    but THC is also present in a scheduled prescription medication.    Trace amounts of THC can be present in  hemp and cannabidiol (CBD)    products. This test is not intended to distinguish between delta-9-    tetrahydrocannabinol, the predominant form of THC in most herbal or    marijuana-based products, and delta-8-tetrahydrocannabinol.  Drug Absent but Declared for Prescription Verification   Methocarbamol                  Not Detected UNEXPECTED   Salicylate                     Not Detected UNEXPECTED    Aspirin, as indicated in the declared medication list, is not always    detected even when used as directed.  ==================================================================== Test                      Result    Flag   Units      Ref Range   Creatinine              75               mg/dL      >=30 ==================================================================== Declared  Medications:  The flagging and interpretation on this report are based on the  following declared medications.  Unexpected results may arise from  inaccuracies in the declared medications.   **Note: The testing scope of this panel includes these medications:   Methocarbamol (Robaxin)   **Note: The testing scope of this panel does not include small to  moderate amounts of these reported medications:   Acetaminophen (Tylenol)  Aspirin  Ibuprofen (Advil)   **Note: The testing scope of this panel does not include the  following reported medications:   Calcium (Tums)  Multivitamin  Umeclidinium (Anoro)  Vilanterol (Anoro)  Vitamin D2 (Drisdol) ==================================================================== For clinical consultation, please call [phone]. ====================================================================       ROS  Constitutional: Denies any fever or chills Gastrointestinal: No reported hemesis, hematochezia, vomiting, or acute GI distress Musculoskeletal: Denies any acute onset joint swelling, redness, loss of ROM, or weakness Neurological: No reported episodes of acute onset apraxia, aphasia, dysarthria, agnosia, amnesia, paralysis, loss of coordination, or loss of consciousness  Medication Review  Multi-Vitamin, NON FORMULARY, Vitamin D (Ergocalciferol), escitalopram, ibuprofen, methocarbamol, and umeclidinium-vilanterol  History Review  Allergy: Ms. Nemitz is allergic to crestor [rosuvastatin], simvastatin, and augmentin [amoxicillin-pot clavulanate]. Drug: Ms. Bok  reports no history of drug use. Alcohol:  reports current alcohol use of about 1.0 standard drink of alcohol per week. Tobacco:  reports that she has been smoking e-cigarettes. She uses smokeless tobacco. Social: Ms. Duquette  reports that she has been smoking e-cigarettes. She uses smokeless tobacco. She reports current alcohol use of about 1.0 standard drink of alcohol  per week. She reports that she does not use drugs. Medical:  has a past medical history of History of tobacco use, Routine general medical examination at a health care facility, and Screening for lipoid disorders. Surgical: Ms. Dicesare  has a past surgical history that includes Other surgical history. Family: family history includes Asthma in her mother; Atrial fibrillation in her father; Bladder Cancer in her father; CVA (age of onset: 32) in her father; Diverticulitis in her brother; Hypertension in her mother; Lung cancer in her brother.  Laboratory Chemistry Profile   Renal Lab Results  Component Value Date   BUN 25 (H) 05/31/2021   CREATININE 0.68 05/31/2021   GFR 84.17 09/20/2016   GFRAA >60 01/16/2019   GFRNONAA >60 05/31/2021  Hepatic Lab Results  Component Value Date   AST 28 05/31/2021   ALT 29 05/31/2021   ALBUMIN 3.8 05/31/2021   ALKPHOS 79 05/31/2021    Electrolytes Lab Results  Component Value Date   NA 139 05/31/2021   K 4.2 05/31/2021   CL 107 05/31/2021   CALCIUM 9.4 05/31/2021   MG 2.3 05/31/2021    Bone Lab Results  Component Value Date   VD25OH 32.18 09/20/2016   25OHVITD1 69 05/31/2021   25OHVITD2 36 05/31/2021   25OHVITD3 33 05/31/2021    Inflammation (CRP: Acute Phase) (ESR: Chronic Phase) Lab Results  Component Value Date   CRP 0.7 05/31/2021   ESRSEDRATE 5 05/31/2021         Note: Above Lab results reviewed.  Recent Imaging Review  DG PAIN CLINIC C-ARM 1-60 MIN NO REPORT Fluoro was used, but no Radiologist interpretation will be provided.  Please refer to "NOTES" tab for provider progress note. Note: Reviewed        Physical Exam  General appearance: Well nourished, well developed, and well hydrated. In no apparent acute distress Mental status: Alert, oriented x 3 (person, place, & time)       Respiratory: No evidence of acute respiratory distress Eyes: PERLA Vitals: BP 119/75   Pulse 77   Temp (!) 96.4 F (35.8 C)   Resp 16    Ht 5\' 6"  (1.676 m)   Wt 165 lb (74.8 kg)   SpO2 98%   BMI 26.63 kg/m  BMI: Estimated body mass index is 26.63 kg/m as calculated from the following:   Height as of this encounter: 5\' 6"  (1.676 m).   Weight as of this encounter: 165 lb (74.8 kg). Ideal: Ideal body weight: 59.3 kg (130 lb 11.7 oz) Adjusted ideal body weight: 65.5 kg (144 lb 7 oz)  Assessment   Diagnosis Status  1. Chronic low back pain (1ry area of Pain) (Bilateral) (R>L) w/o sciatica   2. Chronic lower extremity pain (3ry area of Pain) (nonradicular) (Bilateral) (L>R)   3. Dextroscoliosis of lumbar spine (apex at L3-4)   4. Facet arthritis of lumbar region   5. Grade 1 Anterolisthesis of lumbar spine (L4/L5) (5 mm)   6. Lumbar facet joint pain   7. Lumbar facet syndrome (Bilateral)   8. Lumbosacral facet hypertrophy (Multilevel) (Bilateral)   9. Spondylosis without myelopathy or radiculopathy, lumbosacral region    Recurring Controlled Stable   Updated Problems: Problem  Urge Urinary Incontinence  Prolapse of Anterior Vaginal Wall   Plan of Care  Problem-specific:  Assessment and Plan    Chronic low back pain with facet joint involvement   Chronic low back pain is more pronounced in the morning and at the end of the day, with significant relief from previous facet joint injections. Pain is more on the left side but affects both sides and is higher than previous episodes. Hyperextension rotation maneuver elicits pain, consistent with facet joint involvement. Concerns about long-term effects of steroid injections were addressed, explaining that risk is associated with the number of injections rather than the injections themselves. Early treatment is advised to prevent the need for multiple injections due to swelling and inactivity, which can lead to less effective outcomes. Schedule bilateral facet joint injections. Avoid ibuprofen on the day of the procedure. Educate on the importance of follow-up appointments  for insurance documentation. Discuss the risks and benefits of steroid injections, emphasizing the importance of early treatment to minimize steroid use.  Ms. ASHAYA RAFTERY has a current medication list which includes the following long-term medication(s): escitalopram, methocarbamol, and methocarbamol.  Pharmacotherapy (Medications Ordered): No orders of the defined types were placed in this encounter.  Orders:  Orders Placed This Encounter  Procedures   LUMBAR FACET(MEDIAL BRANCH NERVE BLOCK) MBNB    Diagnosis: Lumbar Facet Syndrome (M47.816); Lumbosacral Facet Syndrome (M47.817); Lumbar Facet Joint Pain (M54.59) Medical Necessity Statement: 1.Severe chronic axial low back pain causing functional impairment documented by ongoing pain scale assessments. 2.Pain present for longer than 3 months (Chronic) documented to have failed noninvasive conservative therapies. 3.Absence of untreated radiculopathy. 4.There is no radiological evidence of untreated fractures, tumor, infection, or deformity.  Physical Examination Findings: Positive Kemp Maneuver: (Y)  Positive Lumbar Hyperextension-Rotation provocative test: (Y)    Standing Status:   Future    Expiration Date:   07/19/2023    Scheduling Instructions:     Procedure: Lumbar facet Block     Type: Medial Branch Block     Side: Bilateral     Purpose: Diagnostic Radiologic Mapping     Level(s): L3-4, L4-5, L5-S1, and TBD by Fluoroscopic Mapping Facets (L2, L3, L4, L5, S1, and TBD Medial Branch)     Sedation: With Sedation.     Timeframe: As soon as schedule allows.    Where will this procedure be performed?:   [facility]   Follow-up plan:   Return for (ECT): (B) L-FCT Blk #5 (1st of 2025).      Interventional Therapies  Risk Factors  Considerations:   (05/31/2021) Abnormal UDS (+) Carboxy-THC ([place])  COPD   POOR RFA Candidate. Non-compliant w/ instructions not to move.  The patient is not a good candidate  for radiofrequency ablation due to the fact that during the diagnostic injections we learned the patient to be incapable of staying still to assist with the procedure.  Because radiofrequency ablation requires that the patient stay very still for the testing phase of the procedure and the patient is incapable of assisting with this, she is not a candidate for radiofrequency ablation.   Planned  Pending:   Diagnostic bilateral lumbar facet MBB (L3-S1) #4    Under consideration:   Diagnostic/therapeutic bilateral L3 and L4 TFESI #1  Therapeutic bilateral lumbar facet MBB (L4-5, L5-S1) #4  NO RFA - unable to stay still    Completed:  (Non-compliant: Constant movement)  Diagnostic bilateral lumbar facet MBB (L3-S1) x3 (04/05/2022) (100/100/90/90) (100% LEP relief)  Diagnostic left SI joint Blk x2 (07/19/2022) (100/100/90/90) (100% LEP relief)    Completed by other providers:   ([place] imaging)  [place] (Ultrasound-guided) right greater trochanteric inj. x1 by Dr. [name] (02/04/2020)  [place] right greater trochanteric bursa inj. x3 by Dr. [name] (11/25/2014; 07/10/2018; 10/19/2018)  [place] right SI joint inj. x1 by Dr. Rodolph Bong (06/05/2019)  [place] left gluteus medius insertion greater trochanteric inj. x1 by Dr. [name] (01/29/2019)  [place] left piriformis tendon sheath inj. x3 by Dr. [name] (12/02/2016; 08/23/2017; 02/15/2018)  [place] left foot neuroma inj. x1 (2 neuromas between 3rd & 4th, and 4th & 5th toes) by Dr. [name] (07/07/2016)  [place] right hip joint inj. x1 by Dr. [name] (12/04/2014)  Diagnostic bilateral L5 TFESI x1 (03/12/2021) by Filomena Jungling, MD (PMR) [facility])  Diagnostic left L5 dorsal ramus and S1, S2, and S3 lateral branch Blk x1, by Erick Colace, MD (09/10/2019)  Therapeutic left L5 dorsal ramus and S1, S2, and S3 lateral branch RFA  x2, by Erick Colace, MD (10/04/2019 & 04/10/2020) (1st provide her with 50% relief of the pain.  2nd made things worse)    Therapeutic  Palliative (PRN) options:   Therapeutic/palliative lumbar facet MBB       Recent Visits No visits were found meeting these conditions. Showing recent visits within past 90 days and meeting all other requirements Today's Visits Date Type Provider Dept  04/19/23 Office Visit Delano Metz, MD [facility]  Showing today's visits and meeting all other requirements Future Appointments No visits were found meeting these conditions. Showing future appointments within next 90 days and meeting all other requirements  I discussed the assessment and treatment plan with the patient. The patient was provided an opportunity to ask questions and all were answered. The patient agreed with the plan and demonstrated an understanding of the instructions.  Patient advised to call back or seek an in-person evaluation if the symptoms or condition worsens.  Duration of encounter: 30 minutes.  Total time on encounter, as per AMA guidelines included both the face-to-face and non-face-to-face time personally spent by the physician and/or other qualified health care professional(s) on the day of the encounter (includes time in activities that require the physician or other qualified health care professional and does not include time in activities normally performed by clinical staff). Physician's time may include the following activities when performed: Preparing to see the patient (e.g., pre-charting review of records, searching for previously ordered imaging, lab work, and nerve conduction tests) Review of prior analgesic pharmacotherapies. Reviewing PMP Interpreting ordered tests (e.g., lab work, imaging, nerve conduction tests) Performing post-procedure evaluations, including interpretation of diagnostic procedures Obtaining and/or reviewing separately obtained history Performing a medically appropriate examination and/or evaluation Counseling and educating the  patient/family/caregiver Ordering medications, tests, or procedures Referring and communicating with other health care professionals (when not separately reported) Documenting clinical information in the electronic or other health record Independently interpreting results (not separately reported) and communicating results to the patient/ family/caregiver Care coordination (not separately reported)  Note by: Oswaldo Done, MD Date: 04/19/2023; Time: 12:01 PM

## 2023-04-18 NOTE — Patient Instructions (Signed)

## 2023-04-19 ENCOUNTER — Ambulatory Visit: Attending: Pain Medicine | Admitting: Pain Medicine

## 2023-04-19 ENCOUNTER — Encounter: Payer: Self-pay | Admitting: Pain Medicine

## 2023-04-19 VITALS — BP 119/75 | HR 77 | Temp 96.4°F | Resp 16 | Ht 66.0 in | Wt 165.0 lb

## 2023-04-19 DIAGNOSIS — M4186 Other forms of scoliosis, lumbar region: Secondary | ICD-10-CM | POA: Diagnosis not present

## 2023-04-19 DIAGNOSIS — M79605 Pain in left leg: Secondary | ICD-10-CM | POA: Diagnosis present

## 2023-04-19 DIAGNOSIS — M79604 Pain in right leg: Secondary | ICD-10-CM | POA: Diagnosis present

## 2023-04-19 DIAGNOSIS — M545 Low back pain, unspecified: Secondary | ICD-10-CM | POA: Insufficient documentation

## 2023-04-19 DIAGNOSIS — M4316 Spondylolisthesis, lumbar region: Secondary | ICD-10-CM | POA: Diagnosis present

## 2023-04-19 DIAGNOSIS — G8929 Other chronic pain: Secondary | ICD-10-CM | POA: Insufficient documentation

## 2023-04-19 DIAGNOSIS — M5459 Other low back pain: Secondary | ICD-10-CM | POA: Diagnosis present

## 2023-04-19 DIAGNOSIS — N3941 Urge incontinence: Secondary | ICD-10-CM | POA: Insufficient documentation

## 2023-04-19 DIAGNOSIS — M47817 Spondylosis without myelopathy or radiculopathy, lumbosacral region: Secondary | ICD-10-CM | POA: Insufficient documentation

## 2023-04-19 DIAGNOSIS — M47816 Spondylosis without myelopathy or radiculopathy, lumbar region: Secondary | ICD-10-CM | POA: Insufficient documentation

## 2023-04-19 DIAGNOSIS — N8110 Cystocele, unspecified: Secondary | ICD-10-CM | POA: Insufficient documentation

## 2023-04-19 NOTE — Progress Notes (Signed)
 Safety precautions to be maintained throughout the outpatient stay will include: orient to surroundings, keep bed in low position, maintain call bell within reach at all times, provide assistance with transfer out of bed and ambulation.

## 2023-04-24 ENCOUNTER — Telehealth: Payer: Self-pay

## 2023-04-24 NOTE — Telephone Encounter (Signed)
 They need you to elaborate on why she is not a candidate for an RFA. They want to know why you are doing therapeutic facets instead of RFA. She saw in the note patient unable to stay still but that is not an acceptable reason. If you can put in an addendum with an explanation I will resubmit the request.

## 2023-04-26 ENCOUNTER — Telehealth: Payer: Self-pay

## 2023-04-26 NOTE — Telephone Encounter (Signed)
 I sent them the note you put in and they denied it again. Her not holding still is not good enough reason for them to approve another facet block.  I spoke to the patient and she said the reason for her not staying still in her previous RFA was because they did not use anesthesia. She said if she was sedated she would be able to stay still. She wants to try doing a RFA.

## 2023-05-10 NOTE — Progress Notes (Addendum)
 Patient: Samantha Cox  Service Category: E/M  Provider: Oswaldo Done, MD  DOB: January 31, 1956  DOS: 05/11/2023  Location: Office  MRN: [id number]  Setting: Ambulatory outpatient  Referring Provider: Mick Sell, MD  Type: Established Patient  Specialty: Interventional Pain Management  PCP: Mick Sell, MD  Location: Remote location  Delivery: TeleHealth     Virtual Encounter - Pain Management PROVIDER NOTE: Information contained herein reflects review and annotations entered in association with encounter. Interpretation of such information and data should be left to medically-trained personnel. Information provided to patient can be located elsewhere in the medical record under "Patient Instructions". Document created using STT-dictation technology, any transcriptional errors that may result from process are unintentional.    Contact & Pharmacy Preferred: [phone] Home: [phone] (home) Mobile: [phone] (mobile) E-mail: [email or web address]  [facility] [place], [place] - [address] [facility] [address] [place] [address] Phone: [phone] Fax: [phone]  [facility] [place] - [address] [place] [address] Phone: [phone] Fax: [phone]  [facility], [place] - [address] [address] [place] [address] Phone: [phone] Fax: [phone]   Pre-screening  Samantha Cox offered "in-person" vs "virtual" encounter. She indicated preferring virtual for this encounter.   Reason COVID-19*  Social distancing based on CDC and AMA recommendations.   I contacted Samantha Cox on 05/11/2023 via telephone.      I clearly identified myself as Oswaldo Done, MD. I verified that I was speaking with the correct person using two identifiers (Name: Samantha Cox, and date of birth:  08/22/1955).  Consent I sought verbal advanced consent from Samantha Cox for virtual visit interactions. I informed Samantha Cox of possible security and privacy concerns, risks, and limitations associated with providing "not-in-person" medical evaluation and management services. I also informed Samantha Cox of the availability of "in-person" appointments. Finally, I informed her that there would be a charge for the virtual visit and that she could be  personally, fully or partially, financially responsible for it. Samantha Cox expressed understanding and agreed to proceed.   Historic Elements   Samantha Cox is a 68 y.o. year old, female patient evaluated today after our last contact on 04/19/2023. Samantha Cox  has a past medical history of History of tobacco use, Routine general medical examination at a health care facility, and Screening for lipoid disorders. She also  has a past surgical history that includes Other surgical history. Samantha Cox has a current medication list which includes the following prescription(s): anoro ellipta, escitalopram, ibuprofen, methocarbamol, methocarbamol, multi-vitamin, NON FORMULARY, and vitamin d (ergocalciferol). She  reports that she has been smoking e-cigarettes. She uses smokeless tobacco. She reports current alcohol use of about 1.0 standard drink of alcohol per week. She reports that she does not use drugs. Samantha Cox is allergic to crestor [rosuvastatin], simvastatin, and augmentin [amoxicillin-pot clavulanate].  BMI: Estimated body mass index is 26.63 kg/m as calculated from the following:   Height as of 04/19/23: 5\' 6"  (1.676 m).   Weight as of 04/19/23: 165 lb (74.8 kg). Last encounter: 04/19/2023. Last procedure: 10/27/2022.  HPI  Today, she is being contacted for  requested appointment to discuss lumbar facet pain. Discussed the use of AI scribe software for clinical note transcription with the patient, who gave verbal consent to  proceed.  History of  Present Illness   Samantha Cox is a 68 year old female who presents with chronic pain management issues related to radiofrequency ablation procedures.  She is experiencing chronic pain that has been increasing over the past several weeks, reaching a point where it becomes quite painful. She is anxious to receive treatment to alleviate the pain. Her current pain management involves injections, which she has found effective in the past.  She has a history of undergoing radiofrequency ablation procedures, with the first one being successful. However, during a subsequent procedure performed by a different doctor in [place], she experienced extreme pain due to the lack of anesthetic, which the doctor was not certified to administer. This led her to seek care at the current clinic in [place].  She recalls that during previous procedures, she required sedation due to her sensitivity to medication, noting that even 2 mg of Versed for anxiety can significantly sedate her and prevent her from following commands.  She is facing challenges with insurance coverage for the lumbar facet blocks, as her insurance company has denied the procedure, suggesting it is diagnostic and recommending radiofrequency ablation instead. She is currently on Medicare, having switched from [facility], and is considering appealing the insurance decision while contemplating paying out-of-pocket for the necessary injections to manage her pain in the interim.       Pharmacotherapy Assessment  Opioid Analgesic: No chronic opioid analgesics therapy prescribed by our practice. None. (05/31/2021) Abnormal UDS (+) Carboxy-THC MME/day: 0 mg/day    Monitoring:  PMP: PDMP not reviewed this encounter.       Pharmacotherapy: No side-effects or adverse reactions reported. Compliance: No problems identified. Effectiveness: Clinically acceptable. Plan: Refer to "POC". UDS:  Summary  Date Value Ref  Range Status  05/31/2021 Note  Final    Comment:    ==================================================================== Compliance Drug Analysis, Ur ==================================================================== Test                             Result       Flag       Units  Drug Present and Declared for Prescription Verification   Acetaminophen                  PRESENT      EXPECTED   Ibuprofen                      PRESENT      EXPECTED  Drug Present not Declared for Prescription Verification   Carboxy-THC                    997          UNEXPECTED ng/mg creat    Carboxy-THC is a metabolite of tetrahydrocannabinol (THC). Source of    THC is most commonly herbal marijuana or marijuana-based products,    but THC is also present in a scheduled prescription medication.    Trace amounts of THC can be present in hemp and cannabidiol (CBD)    products. This test is not intended to distinguish between delta-9-    tetrahydrocannabinol, the predominant form of THC in most herbal or    marijuana-based products, and delta-8-tetrahydrocannabinol.  Drug Absent but Declared for Prescription Verification   Methocarbamol                  Not Detected UNEXPECTED   Salicylate  Not Detected UNEXPECTED    Aspirin, as indicated in the declared medication list, is not always    detected even when used as directed.  ==================================================================== Test                      Result    Flag   Units      Ref Range   Creatinine              75               mg/dL      >=52 ==================================================================== Declared Medications:  The flagging and interpretation on this report are based on the  following declared medications.  Unexpected results may arise from  inaccuracies in the declared medications.   **Note: The testing scope of this panel includes these medications:   Methocarbamol (Robaxin)   **Note:  The testing scope of this panel does not include small to  moderate amounts of these reported medications:   Acetaminophen (Tylenol)  Aspirin  Ibuprofen (Advil)   **Note: The testing scope of this panel does not include the  following reported medications:   Calcium (Tums)  Multivitamin  Umeclidinium (Anoro)  Vilanterol (Anoro)  Vitamin D2 (Drisdol) ==================================================================== For clinical consultation, please call [phone]. ====================================================================    Lab Results  Component Value Date   CBDTHCR Comment 05/31/2021   D8THCCBX >500 05/31/2021   D9THCCBX 117.4 05/31/2021     Laboratory Chemistry Profile   Renal Lab Results  Component Value Date   BUN 25 (H) 05/31/2021   CREATININE 0.68 05/31/2021   GFR 84.17 09/20/2016   GFRAA >60 01/16/2019   GFRNONAA >60 05/31/2021    Hepatic Lab Results  Component Value Date   AST 28 05/31/2021   ALT 29 05/31/2021   ALBUMIN 3.8 05/31/2021   ALKPHOS 79 05/31/2021    Electrolytes Lab Results  Component Value Date   NA 139 05/31/2021   K 4.2 05/31/2021   CL 107 05/31/2021   CALCIUM 9.4 05/31/2021   MG 2.3 05/31/2021    Bone Lab Results  Component Value Date   VD25OH 32.18 09/20/2016   25OHVITD1 69 05/31/2021   25OHVITD2 36 05/31/2021   25OHVITD3 33 05/31/2021    Inflammation (CRP: Acute Phase) (ESR: Chronic Phase) Lab Results  Component Value Date   CRP 0.7 05/31/2021   ESRSEDRATE 5 05/31/2021         Note: Above Lab results reviewed.  Imaging  DG PAIN CLINIC C-ARM 1-60 MIN NO REPORT Fluoro was used, but no Radiologist interpretation will be provided.  Please refer to "NOTES" tab for provider progress note.  Assessment  The primary encounter diagnosis was Chronic low back pain (1ry area of Pain) (Bilateral) (R>L) w/o sciatica. Diagnoses of Grade 1 Anterolisthesis of lumbar spine (L4/L5) (5 mm), Lumbar facet syndrome  (Bilateral), Lumbar facet joint pain, and Spondylosis without myelopathy or radiculopathy, lumbosacral region were also pertinent to this visit.  Plan of Care  Assessment and Plan    Chronic Facet Joint Pain   Chronic facet joint pain was previously managed with injections, but insurance denied diagnostic injections, suggesting radiofrequency ablation instead. She experiences difficulty remaining still during procedures due to pain, complicating radiofrequency ablation, which requires minimal sedation and patient cooperation for accurate needle placement. Previous procedures without sedation resulted in significant pain, challenging her ability to remain still. The necessity of minimal sedation for radiofrequency ablation and its potential ineffectiveness if she cannot remain still was explained.  Alternative treatments like the Sprint peripheral stimulator were discussed but are limited in scope. She is considering appealing the insurance decision and is willing to pay for the injection if necessary. Reducing pain with an injection might allow better tolerance of radiofrequency ablation. Insurance appeals from patients tend to be more effective than from providers. Administer an injection to manage pain. Assist in appealing the insurance decision. Schedule radiofrequency ablation if pain is managed and she can remain still. Discuss with Alona Bene regarding her plan and insurance appeal.       Problem-specific:  No problem-specific Assessment & Plan notes found for this encounter.  Samantha Cox has a current medication list which includes the following long-term medication(s): escitalopram, methocarbamol, and methocarbamol.  Pharmacotherapy (Medications Ordered): No orders of the defined types were placed in this encounter.  Orders:  Orders Placed This Encounter  Procedures   LUMBAR FACET(MEDIAL BRANCH NERVE BLOCK) MBNB    Diagnosis: Lumbar Facet Syndrome (M47.816); Lumbosacral Facet  Syndrome (M47.817); Lumbar Facet Joint Pain (M54.59) Medical Necessity Statement: 1.Severe chronic axial low back pain causing functional impairment documented by ongoing pain scale assessments. 2.Pain present for longer than 3 months (Chronic) documented to have failed noninvasive conservative therapies. 3.Absence of untreated radiculopathy. 4.There is no radiological evidence of untreated fractures, tumor, infection, or deformity.    Standing Status:   Future    Expiration Date:   08/10/2023    Scheduling Instructions:     Procedure: Lumbar facet Block     Type: Medial Branch Block     Side: Bilateral     Purpose: Therapeutic     Level(s): L4-5 and L5-S1 Facets (L3, L4, L5, and S1 Medial Branch)     Sedation: With Sedation.     Timeframe: Pending approval.    Where will this procedure be performed?:   [facility]   Follow-up plan:   Return for (ECT): (B) L-FCT Blk (L3-S1 MBB) #4.      Interventional Therapies  Risk Factors  Considerations:   (05/31/2021) Abnormal UDS (+) Carboxy-THC (MARIJUANA)  COPD   POOR RFA Candidate. Non-compliant w/ instructions not to move.  The patient is not a good candidate for radiofrequency ablation due to the fact that during the diagnostic injections we learned the patient to be incapable of staying still to assist with the procedure.  Because radiofrequency ablation requires that the patient stay very still for the testing phase of the procedure and the patient is incapable of assisting with this, she is not a candidate for radiofrequency ablation.   Planned  Pending:   Diagnostic bilateral lumbar facet MBB (L3-S1) #4    Under consideration:   Diagnostic/therapeutic bilateral L3 and L4 TFESI #1  Therapeutic bilateral lumbar facet MBB (L4-5, L5-S1) #4  NO RFA - unable to stay still (read above)    Completed:  (Non-compliant: Constant movement)  Diagnostic bilateral lumbar facet MBB (L3-S1) x3 (04/05/2022) (100/100/90/90) (100% LEP relief)   Diagnostic left SI joint Blk x2 (07/19/2022) (100/100/90/90) (100% LEP relief)    Completed by other providers:   ([facility])  [place] (Ultrasound-guided) right greater trochanteric inj. x1 by Dr. [name] (02/04/2020)  [place] right greater trochanteric bursa inj. x3 by Dr. [name] (11/25/2014; 07/10/2018; 10/19/2018)  [place] right SI joint inj. x1 by Dr. Rodolph Bong (06/05/2019)  [place] left gluteus medius insertion greater trochanteric inj. x1 by Dr. [name] (01/29/2019)  [place] left piriformis tendon sheath inj. x3 by Dr. [name] (12/02/2016; 08/23/2017; 02/15/2018)  [place] left foot neuroma inj. x1 (2 neuromas between 3rd & 4th, and 4th & 5th toes) by Dr. [name] (07/07/2016)  [place] right hip joint inj. x1 by Dr. [name] (12/04/2014)  Diagnostic bilateral L5 TFESI x1 (03/12/2021) by Filomena Jungling, MD (PMR) [facility])  Diagnostic left L5 dorsal ramus and S1, S2, and S3 lateral branch Blk x1, by Erick Colace, MD (09/10/2019)  Therapeutic left L5 dorsal ramus and S1, S2, and S3 lateral branch RFA x2, by Erick Colace, MD (10/04/2019 & 04/10/2020) (1st provide her with 50% relief of the pain.  2nd made things worse)   Therapeutic  Palliative (PRN) options:   Therapeutic/palliative lumbar facet MBB       Recent Visits Date Type Provider Dept  04/19/23 Office Visit Delano Metz, MD [facility]  Showing recent visits within past 90 days and meeting all other requirements Today's Visits Date Type Provider Dept  05/11/23 Office Visit Delano Metz, MD [facility]  Showing today's visits and meeting all other requirements Future Appointments No visits were found meeting these conditions. Showing future appointments within next 90 days and meeting all other requirements  I discussed the assessment and treatment plan with the patient. The patient was provided an opportunity to ask questions and all were answered. The patient agreed with the  plan and demonstrated an understanding of the instructions.  Patient advised to call back or seek an in-person evaluation if the symptoms or condition worsens.  Duration of encounter: 36 minutes.  Note by: Oswaldo Done, MD Date: 05/11/2023; Time: 4:27 PM

## 2023-05-11 ENCOUNTER — Ambulatory Visit: Attending: Pain Medicine | Admitting: Pain Medicine

## 2023-05-11 DIAGNOSIS — G8929 Other chronic pain: Secondary | ICD-10-CM

## 2023-05-11 DIAGNOSIS — M47817 Spondylosis without myelopathy or radiculopathy, lumbosacral region: Secondary | ICD-10-CM

## 2023-05-11 DIAGNOSIS — M5459 Other low back pain: Secondary | ICD-10-CM | POA: Diagnosis not present

## 2023-05-11 DIAGNOSIS — M4316 Spondylolisthesis, lumbar region: Secondary | ICD-10-CM

## 2023-05-11 DIAGNOSIS — M47816 Spondylosis without myelopathy or radiculopathy, lumbar region: Secondary | ICD-10-CM

## 2023-05-11 DIAGNOSIS — M545 Low back pain, unspecified: Secondary | ICD-10-CM | POA: Diagnosis not present

## 2023-05-11 NOTE — Patient Instructions (Signed)

## 2023-05-12 ENCOUNTER — Telehealth: Payer: Self-pay

## 2023-05-12 NOTE — Telephone Encounter (Signed)
 Spoke with patient and gave pre-procedure instructions. Patient verbalized understanding.

## 2023-05-15 ENCOUNTER — Telehealth: Payer: Self-pay

## 2023-05-15 NOTE — Telephone Encounter (Signed)
 Im not sure exactly what you want to order. Last month you ordered lumbar facets which medicare denied, because she has had multiple facets. They want her to have an RFA. Her holding still is not a reason for them to approve more facets. I tried that last month. It will not work.  The only thing I am able to submit a request for is an RFA. Please advise.  Please see the 04/24/23 and 04/26/23 phone messages in the chart to collaborate what I am saying.

## 2023-05-16 NOTE — Telephone Encounter (Signed)
 Please change the wording. You say in the notes Samantha is Samantha trouble getting auth for RFA but it should say lumbar Cox. If its not changed there will be confusion on their end. I copied the paragraph that needs changing. Thanks  Samantha is facing challenges with insurance coverage for the radiofrequency ablation, Samantha Cox, Samantha Cox, Samantha Cox, Samantha is considering appealing the insurance decision while contemplating paying out-of-pocket for the necessary injections to manage her pain in the interim.

## 2023-06-13 ENCOUNTER — Ambulatory Visit
Admission: RE | Admit: 2023-06-13 | Discharge: 2023-06-13 | Disposition: A | Discharge status: Home / Self Care | Source: Ambulatory Visit | Attending: Pain Medicine | Admitting: Pain Medicine

## 2023-06-13 ENCOUNTER — Ambulatory Visit: Attending: Pain Medicine | Admitting: Pain Medicine

## 2023-06-13 ENCOUNTER — Encounter: Payer: Self-pay | Admitting: Pain Medicine

## 2023-06-13 VITALS — BP 110/80 | HR 79 | Temp 96.4°F | Resp 20 | Ht 65.0 in | Wt 162.0 lb

## 2023-06-13 DIAGNOSIS — R937 Abnormal findings on diagnostic imaging of other parts of musculoskeletal system: Secondary | ICD-10-CM | POA: Insufficient documentation

## 2023-06-13 DIAGNOSIS — M47817 Spondylosis without myelopathy or radiculopathy, lumbosacral region: Secondary | ICD-10-CM | POA: Diagnosis not present

## 2023-06-13 DIAGNOSIS — G8929 Other chronic pain: Secondary | ICD-10-CM | POA: Insufficient documentation

## 2023-06-13 DIAGNOSIS — M545 Low back pain, unspecified: Secondary | ICD-10-CM | POA: Insufficient documentation

## 2023-06-13 DIAGNOSIS — M4316 Spondylolisthesis, lumbar region: Secondary | ICD-10-CM | POA: Diagnosis present

## 2023-06-13 DIAGNOSIS — M47816 Spondylosis without myelopathy or radiculopathy, lumbar region: Secondary | ICD-10-CM | POA: Insufficient documentation

## 2023-06-13 DIAGNOSIS — M5459 Other low back pain: Secondary | ICD-10-CM | POA: Diagnosis present

## 2023-06-13 MED ORDER — TRIAMCINOLONE ACETONIDE 40 MG/ML IJ SUSP
80.0000 mg | Freq: Once | INTRAMUSCULAR | Status: AC
Start: 2023-06-13 — End: 2023-06-13
  Administered 2023-06-13: 80 mg

## 2023-06-13 MED ORDER — TRIAMCINOLONE ACETONIDE 40 MG/ML IJ SUSP
INTRAMUSCULAR | Status: AC
  Filled 2023-06-13: qty 2

## 2023-06-13 MED ORDER — MIDAZOLAM HCL 5 MG/5ML IJ SOLN
INTRAMUSCULAR | Status: AC
  Filled 2023-06-13: qty 5

## 2023-06-13 MED ORDER — FENTANYL CITRATE (PF) 100 MCG/2ML IJ SOLN
INTRAMUSCULAR | Status: AC
  Filled 2023-06-13: qty 2

## 2023-06-13 MED ORDER — PENTAFLUOROPROP-TETRAFLUOROETH EX AERO
INHALATION_SPRAY | Freq: Once | CUTANEOUS | Status: AC
  Administered 2023-06-13: 30 via TOPICAL

## 2023-06-13 MED ORDER — LIDOCAINE HCL 2 % IJ SOLN
20.0000 mL | Freq: Once | INTRAMUSCULAR | Status: AC
  Administered 2023-06-13: 400 mg

## 2023-06-13 MED ORDER — FENTANYL CITRATE (PF) 100 MCG/2ML IJ SOLN
25.0000 ug | INTRAMUSCULAR | Status: DC | PRN
  Administered 2023-06-13: 50 ug via INTRAVENOUS

## 2023-06-13 MED ORDER — ROPIVACAINE HCL 2 MG/ML IJ SOLN
18.0000 mL | Freq: Once | INTRAMUSCULAR | Status: AC
  Administered 2023-06-13: 18 mL via PERINEURAL

## 2023-06-13 MED ORDER — MIDAZOLAM HCL 5 MG/5ML IJ SOLN
0.5000 mg | Freq: Once | INTRAMUSCULAR | Status: AC
  Administered 2023-06-13: 2 mg via INTRAVENOUS
  Administered 2023-06-13: 1 mg via INTRAVENOUS

## 2023-06-13 NOTE — Progress Notes (Signed)
 PROVIDER NOTE: Interpretation of information contained herein should be left to medically-trained personnel. Specific patient instructions are provided elsewhere under "Patient Instructions" section of medical record. This document was created in part using STT-dictation technology, any transcriptional errors that may result from this process are unintentional.  Patient: Samantha Cox Type: Established DOB: [birth year] MRN: [id number] PCP: Eartha Gold, MD  Service: Procedure DOS: 06/13/2023 Setting: Ambulatory Location: Ambulatory outpatient facility Delivery: Face-to-face Provider: Candi Chafe, MD Specialty: Interventional Pain Management Specialty designation: 09 Location: Outpatient facility Ref. Prov.: Eartha Gold, MD       Interventional Therapy   Type: Lumbar Facet, Medial Branch Block(s)   #4  Laterality: Bilateral  Level: L3, L4, L5, and S1 Medial Branch Level(s). Injecting these levels blocks the L4-5 and L5-S1 lumbar facet joints.  Imaging: Fluoroscopic guidance Spinal ([id number]) Anesthesia: Local anesthesia (1-2% Lidocaine ) Anxiolysis: IV Versed          Sedation: Moderate Sedation                       DOS: 06/13/2023 Performed by: Candi Chafe, MD  Primary Purpose: Diagnostic/Therapeutic Indications: Low back pain severe enough to impact quality of life or function. 1. Chronic low back pain (1ry area of Pain) (Bilateral) (R>L) w/o sciatica   2. Facet arthritis of lumbar region   3. Grade 1 Anterolisthesis of lumbar spine (L4/L5) (5 mm)   4. Lumbar facet joint pain   5. Lumbar facet syndrome (Bilateral)   6. Lumbosacral facet hypertrophy (Multilevel) (Bilateral)   7. Spondylosis without myelopathy or radiculopathy, lumbosacral region   8. Abnormal MRI, lumbar spine (07/28/2019)    NAS-11 Pain score:   Pre-procedure: 6 /10   Post-procedure: 0-No pain/10     Position / Prep / Materials:  Position: Prone  Prep solution: ChloraPrep  (2% chlorhexidine gluconate and 70% isopropyl alcohol) Area Prepped: Posterolateral Lumbosacral Spine (Wide prep: From the lower border of the scapula down to the end of the tailbone and from flank to flank.)  Materials:  Tray: Block Needle(s):  Type: Spinal  Gauge (G): 22  Length: 3.5-in Qty: 4     H&P (Pre-op Assessment):  Ms. Valladolid is a 68 y.o. (year old), female patient, seen today for interventional treatment. She  has a past surgical history that includes Other surgical history. Ms. Knapper has a current medication list which includes the following prescription(s): anoro ellipta, escitalopram, ibuprofen , methocarbamol , multi-vitamin, NON FORMULARY, vitamin d  (ergocalciferol ), and methocarbamol , and the following Facility-Administered Medications: fentanyl . Her primarily concern today is the Back Pain (lower)  Initial Vital Signs:  Pulse/HCG Rate: 79ECG Heart Rate: (!) 20 Temp: (!) 96.4 F (35.8 C) Resp: 16 BP: 115/81 SpO2: 94 %  BMI: Estimated body mass index is 26.96 kg/m as calculated from the following:   Height as of this encounter: 5\' 5"  (1.651 m).   Weight as of this encounter: 162 lb (73.5 kg).  Risk Assessment: Allergies: Reviewed. She is allergic to crestor  [rosuvastatin ], simvastatin , and augmentin [amoxicillin-pot clavulanate].  Allergy  Precautions: None required Coagulopathies: Reviewed. None identified.  Blood-thinner therapy: None at this time Active Infection(s): Reviewed. None identified. Ms. Heiny is afebrile  Site Confirmation: Ms. Scheurer was asked to confirm the procedure and laterality before marking the site Procedure checklist: Completed Consent: Before the procedure and under the influence of no sedative(s), amnesic(s), or anxiolytics, the patient was informed of the treatment options, risks and possible complications. To fulfill our ethical and legal obligations, as recommended  by the American Medical Association's Code of Ethics, I have  informed the patient of my clinical impression; the nature and purpose of the treatment or procedure; the risks, benefits, and possible complications of the intervention; the alternatives, including doing nothing; the risk(s) and benefit(s) of the alternative treatment(s) or procedure(s); and the risk(s) and benefit(s) of doing nothing. The patient was provided information about the general risks and possible complications associated with the procedure. These may include, but are not limited to: failure to achieve desired goals, infection, bleeding, organ or nerve damage, allergic reactions, paralysis, and death. In addition, the patient was informed of those risks and complications associated to Spine-related procedures, such as failure to decrease pain; infection (i.e.: Meningitis, epidural or intraspinal abscess); bleeding (i.e.: epidural hematoma, subarachnoid hemorrhage, or any other type of intraspinal or peri-dural bleeding); organ or nerve damage (i.e.: Any type of peripheral nerve, nerve root, or spinal cord injury) with subsequent damage to sensory, motor, and/or autonomic systems, resulting in permanent pain, numbness, and/or weakness of one or several areas of the body; allergic reactions; (i.e.: anaphylactic reaction); and/or death. Furthermore, the patient was informed of those risks and complications associated with the medications. These include, but are not limited to: allergic reactions (i.e.: anaphylactic or anaphylactoid reaction(s)); adrenal axis suppression; blood sugar elevation that in diabetics may result in ketoacidosis or comma; water retention that in patients with history of congestive heart failure may result in shortness of breath, pulmonary edema, and decompensation with resultant heart failure; weight gain; swelling or edema; medication-induced neural toxicity; particulate matter embolism and blood vessel occlusion with resultant organ, and/or nervous system infarction; and/or  aseptic necrosis of one or more joints. Finally, the patient was informed that Medicine is not an exact science; therefore, there is also the possibility of unforeseen or unpredictable risks and/or possible complications that may result in a catastrophic outcome. The patient indicated having understood very clearly. We have given the patient no guarantees and we have made no promises. Enough time was given to the patient to ask questions, all of which were answered to the patient's satisfaction. Ms. Duskey has indicated that she wanted to continue with the procedure. Attestation: I, the ordering provider, attest that I have discussed with the patient the benefits, risks, side-effects, alternatives, likelihood of achieving goals, and potential problems during recovery for the procedure that I have provided informed consent. Date  Time: 06/13/2023  9:45 AM  Pre-Procedure Preparation:  Monitoring: As per clinic protocol. Respiration, ETCO2, SpO2, BP, heart rate and rhythm monitor placed and checked for adequate function Safety Precautions: Patient was assessed for positional comfort and pressure points before starting the procedure. Time-out: I initiated and conducted the "Time-out" before starting the procedure, as per protocol. The patient was asked to participate by confirming the accuracy of the "Time Out" information. Verification of the correct person, site, and procedure were performed and confirmed by me, the nursing staff, and the patient. "Time-out" conducted as per Joint Commission's Universal Protocol (UP.01.01.01). Time: 1027 Start Time: 1027 hrs.  Description of Procedure:          Laterality: (see above) Targeted Levels: (see above)  Safety Precautions: Aspiration looking for blood return was conducted prior to all injections. At no point did we inject any substances, as a needle was being advanced. Before injecting, the patient was told to immediately notify me if she was experiencing  any new onset of "ringing in the ears, or metallic taste in the mouth". No attempts were made at  seeking any paresthesias. Safe injection practices and needle disposal techniques used. Medications properly checked for expiration dates. SDV (single dose vial) medications used. After the completion of the procedure, all disposable equipment used was discarded in the proper designated medical waste containers. Local Anesthesia: Protocol guidelines were followed. The patient was positioned over the fluoroscopy table. The area was prepped in the usual manner. The time-out was completed. The target area was identified using fluoroscopy. A 12-in long, straight, sterile hemostat was used with fluoroscopic guidance to locate the targets for each level blocked. Once located, the skin was marked with an approved surgical skin marker. Once all sites were marked, the skin (epidermis, dermis, and hypodermis), as well as deeper tissues (fat, connective tissue and muscle) were infiltrated with a small amount of a short-acting local anesthetic, loaded on a 10cc syringe with a 25G, 1.5-in  Needle. An appropriate amount of time was allowed for local anesthetics to take effect before proceeding to the next step. Local Anesthetic: Lidocaine  2.0% The unused portion of the local anesthetic was discarded in the proper designated containers. Technical description of process:  L2 Medial Branch Nerve Block (MBB): The target area for the L2 medial branch is at the junction of the postero-lateral aspect of the superior articular process and the superior, posterior, and medial edge of the transverse process of L3. Under fluoroscopic guidance, a Quincke needle was inserted until contact was made with os over the superior postero-lateral aspect of the pedicular shadow (target area). After negative aspiration for blood, 0.5 mL of the nerve block solution was injected without difficulty or complication. The needle was removed intact. L3 Medial  Branch Nerve Block (MBB): The target area for the L3 medial branch is at the junction of the postero-lateral aspect of the superior articular process and the superior, posterior, and medial edge of the transverse process of L4. Under fluoroscopic guidance, a Quincke needle was inserted until contact was made with os over the superior postero-lateral aspect of the pedicular shadow (target area). After negative aspiration for blood, 0.5 mL of the nerve block solution was injected without difficulty or complication. The needle was removed intact. L4 Medial Branch Nerve Block (MBB): The target area for the L4 medial branch is at the junction of the postero-lateral aspect of the superior articular process and the superior, posterior, and medial edge of the transverse process of L5. Under fluoroscopic guidance, a Quincke needle was inserted until contact was made with os over the superior postero-lateral aspect of the pedicular shadow (target area). After negative aspiration for blood, 0.5 mL of the nerve block solution was injected without difficulty or complication. The needle was removed intact. L5 Medial Branch Nerve Block (MBB): The target area for the L5 medial branch is at the junction of the postero-lateral aspect of the superior articular process and the superior, posterior, and medial edge of the sacral ala. Under fluoroscopic guidance, a Quincke needle was inserted until contact was made with os over the superior postero-lateral aspect of the pedicular shadow (target area). After negative aspiration for blood, 0.5 mL of the nerve block solution was injected without difficulty or complication. The needle was removed intact. S1 Medial Branch Nerve Block (MBB): The target area for the S1 medial branch is at the posterior and inferior 6 o'clock position of the L5-S1 facet joint. Under fluoroscopic guidance, the Quincke needle inserted for the L5 MBB was redirected until contact was made with os over the inferior  and postero aspect of the sacrum, at  the 6 o' clock position under the L5-S1 facet joint (Target area). After negative aspiration for blood, 0.5 mL of the nerve block solution was injected without difficulty or complication. The needle was removed intact.  Once the entire procedure was completed, the treated area was cleaned, making sure to leave some of the prepping solution back to take advantage of its long term bactericidal properties.         Illustration of the posterior view of the lumbar spine and the posterior neural structures. Laminae of L2 through S1 are labeled. DPRL5, dorsal primary ramus of L5; DPRS1, dorsal primary ramus of S1; DPR3, dorsal primary ramus of L3; FJ, facet (zygapophyseal) joint L3-L4; I, inferior articular process of L4; LB1, lateral branch of dorsal primary ramus of L1; IAB, inferior articular branches from L3 medial branch (supplies L4-L5 facet joint); IBP, intermediate branch plexus; MB3, medial branch of dorsal primary ramus of L3; NR3, third lumbar nerve root; S, superior articular process of L5; SAB, superior articular branches from L4 (supplies L4-5 facet joint also); TP3, transverse process of L3.   Facet Joint Innervation (* possible contribution)  L1-2 T12, L1 (L2*)  Medial Branch  L2-3 L1, L2 (L3*)         "          "  L3-4 L2, L3 (L4*)         "          "  L4-5 L3, L4 (L5*)         "          "  L5-S1 L4, L5, S1          "          "    Vitals:   06/13/23 1035 06/13/23 1039 06/13/23 1049 06/13/23 1059  BP: 118/68 (!) 116/39 111/82 110/80  Pulse:  79    Resp: 16 16 20    Temp:      TempSrc:      SpO2: 100%  90% 96%  Weight:      Height:         End Time: 1038 hrs.  Imaging Guidance (Spinal):          Type of Imaging Technique: Fluoroscopy Guidance (Spinal) Indication(s): Fluoroscopy guidance for needle placement to enhance accuracy in procedures requiring precise needle localization for targeted delivery of medication in or near specific  anatomical locations not easily accessible without such real-time imaging assistance. Exposure Time: Please see nurses notes. Contrast: None used. Fluoroscopic Guidance: I was personally present during the use of fluoroscopy. "Tunnel Vision Technique" used to obtain the best possible view of the target area. Parallax error corrected before commencing the procedure. "Direction-depth-direction" technique used to introduce the needle under continuous pulsed fluoroscopy. Once target was reached, antero-posterior, oblique, and lateral fluoroscopic projection used confirm needle placement in all planes. Images permanently stored in EMR. Interpretation: No contrast injected. I personally interpreted the imaging intraoperatively. Adequate needle placement confirmed in multiple planes. Permanent images saved into the patient's record.  Post-operative Assessment:  Post-procedure Vital Signs:  Pulse/HCG Rate: 7988 Temp: (!) 96.4 F (35.8 C) Resp: 20 BP: 110/80 SpO2: 96 %  EBL: None  Complications: No immediate post-treatment complications observed by team, or reported by patient.  Note: The patient tolerated the entire procedure well. A repeat set of vitals were taken after the procedure and the patient was kept under observation following institutional policy, for this type of procedure. Post-procedural neurological assessment was  performed, showing return to baseline, prior to discharge. The patient was provided with post-procedure discharge instructions, including a section on how to identify potential problems. Should any problems arise concerning this procedure, the patient was given instructions to immediately contact us , at any time, without hesitation. In any case, we plan to contact the patient by telephone for a follow-up status report regarding this interventional procedure.  Comments:  No additional relevant information.  Plan of Care (POC)  Orders:  Orders Placed This Encounter   Procedures   LUMBAR FACET(MEDIAL BRANCH NERVE BLOCK) MBNB    Scheduling Instructions:     Procedure: Lumbar facet block (AKA.: Lumbosacral medial branch nerve block)     Side: Bilateral     Level: L4-5 and L5-S1 Facets (L3, L4, L5, and S1 Medial Branch Nerves)     Sedation: Patient's choice.     Timeframe: Today    Where will this procedure be performed?:   [facility]   DG PAIN [facility] C-ARM 1-60 MIN NO REPORT    Intraoperative interpretation by procedural physician at [facility].    Standing Status:   Standing    Number of Occurrences:   1    Reason for exam::   Assistance in needle guidance and placement for procedures requiring needle placement in or near specific anatomical locations not easily accessible without such assistance.   Informed Consent Details: Physician/Practitioner Attestation; Transcribe to consent form and obtain patient signature    Nursing Order: Transcribe to consent form and obtain patient signature. Note: Always confirm laterality of pain with Ms. Staten, before procedure.    Physician/Practitioner attestation of informed consent for procedure/surgical case:   I, the physician/practitioner, attest that I have discussed with the patient the benefits, risks, side effects, alternatives, likelihood of achieving goals and potential problems during recovery for the procedure that I have provided informed consent.    Procedure:   Lumbar Facet Block  under fluoroscopic guidance    Physician/Practitioner performing the procedure:   Oslo Huntsman A. Barth Borne MD    Indication/Reason:   Low Back Pain, with our without leg pain, due to Facet Joint Arthralgia (Joint Pain) Spondylosis (Arthritis of the Spine), without myelopathy or radiculopathy (Nerve Damage).   Provide equipment / supplies at bedside    Procedure tray: "Block Tray" (Disposable  single use) Skin infiltration needle: Regular 1.5-in, 25-G, (x1) Block Needle type: Spinal Amount/quantity:  4 Size: Regular (3.5-inch) Gauge: 22G    Standing Status:   Standing    Number of Occurrences:   1    Specify:   Block Tray   Saline lock IV    Have LR [phone] mL available and administer at 125 mL/hr if patient becomes hypotensive.    Standing Status:   Standing    Number of Occurrences:   1   Chronic Opioid Analgesic:   No chronic opioid analgesics therapy prescribed by our practice. None. (05/31/2021) Abnormal UDS (+) Carboxy-THC MME/day: 0 mg/day     Medications ordered for procedure: Meds ordered this encounter  Medications   lidocaine  (XYLOCAINE ) 2 % (with pres) injection 400 mg   pentafluoroprop-tetrafluoroeth (GEBAUERS) aerosol   midazolam  (VERSED ) 5 MG/5ML injection 0.5-2 mg    Make sure Flumazenil is available in the pyxis when using this medication. If oversedation occurs, administer 0.2 mg IV over 15 sec. If after 45 sec no response, administer 0.2 mg again over 1 min; may repeat at 1 min intervals; not to exceed 4 doses (1 mg)   fentaNYL  (  SUBLIMAZE ) injection 25-50 mcg    Make sure Narcan is available in the pyxis when using this medication. In the event of respiratory depression (RR< 8/min): Titrate NARCAN (naloxone) in increments of 0.1 to 0.2 mg IV at 2-3 minute intervals, until desired degree of reversal.   ropivacaine  (PF) 2 mg/mL (0.2%) (NAROPIN ) injection 18 mL   triamcinolone  acetonide (KENALOG -40) injection 80 mg   Medications administered: We administered lidocaine , pentafluoroprop-tetrafluoroeth, midazolam , fentaNYL , ropivacaine  (PF) 2 mg/mL (0.2%), and triamcinolone  acetonide.  See the medical record for exact dosing, route, and time of administration.  Follow-up plan:   Return in about 2 weeks (around 06/27/2023) for (Face2F), (PPE).       Interventional Therapies  Risk Factors  Considerations:   (05/31/2021) Abnormal UDS (+) Carboxy-THC (MARIJUANA)  COPD   POOR RFA Candidate. Non-compliant w/ instructions not to move.  The patient is not a good  candidate for radiofrequency ablation due to the fact that during the diagnostic injections we learned the patient to be incapable of staying still to assist with the procedure.  Because radiofrequency ablation requires that the patient stay very still for the testing phase of the procedure and the patient is incapable of assisting with this, she is not a candidate for radiofrequency ablation.   Planned  Pending:   Diagnostic bilateral lumbar facet MBB (L3-S1) #4    Under consideration:   Diagnostic/therapeutic bilateral L3 and L4 TFESI #1  Therapeutic bilateral lumbar facet MBB (L4-5, L5-S1) #4  NO RFA - unable to stay still (read above)    Completed:  (Non-compliant: Constant movement)  Diagnostic bilateral lumbar facet MBB (L3-S1) x3 (04/05/2022) (100/100/90/90) (100% LEP relief)  Diagnostic left SI joint Blk x2 (07/19/2022) (100/100/90/90) (100% LEP relief)    Completed by other providers:   ([place] [facility])  US  (Ultrasound-guided) right greater trochanteric inj. x1 by Dr. Garlan Juniper (02/04/2020)  US  right greater trochanteric bursa inj. x3 by Dr. Ronnell Coins (11/25/2014; 07/10/2018; 10/19/2018)  US  right SI joint inj. x1 by Dr. Syliva Even (06/05/2019)  US  left gluteus medius insertion greater trochanteric inj. x1 by Dr. Garlan Juniper (01/29/2019)  US  left piriformis tendon sheath inj. x3 by Dr. Ronnell Coins (12/02/2016; 08/23/2017; 02/15/2018)  US  left foot neuroma inj. x1 (2 neuromas between 3rd & 4th, and 4th & 5th toes) by Dr. Ronnell Coins (07/07/2016)  US  right hip joint inj. x1 by Dr. Ronnell Coins (12/04/2014)  Diagnostic bilateral L5 TFESI x1 (03/12/2021) by Candi Chafe, MD (PMR) [facility])  Diagnostic left L5 dorsal ramus and S1, S2, and S3 lateral branch Blk x1, by Genetta Kenning, MD (09/10/2019)  Therapeutic left L5 dorsal ramus and S1, S2, and S3 lateral branch RFA x2, by Genetta Kenning, MD (10/04/2019 & 04/10/2020) (1st provide her with 50% relief of the pain.   2nd made things worse)   Therapeutic  Palliative (PRN) options:   Therapeutic/palliative lumbar facet MBB        Recent Visits Date Type Provider Dept  05/11/23 Office Visit Renaldo Caroli, MD [facility]  04/19/23 Office Visit Renaldo Caroli, MD [facility]  Showing recent visits within past 90 days and meeting all other requirements Today's Visits Date Type Provider Dept  06/13/23 Procedure visit Renaldo Caroli, MD [facility]  Showing today's visits and meeting all other requirements Future Appointments Date Type Provider Dept  07/05/23 Appointment Renaldo Caroli, MD [facility]  Showing future appointments within next 90 days and meeting all other requirements  Disposition:  Discharge home  Discharge (Date  Time): 06/13/2023; 1110 hrs.   Primary Care Physician: Eartha Gold, MD Location: [facility] Note by: Candi Chafe, MD (TTS technology used. I apologize for any typographical errors that were not detected and corrected.) Date: 06/13/2023; Time: 11:08 AM  Disclaimer:  Medicine is not an Visual merchandiser. The only guarantee in medicine is that nothing is guaranteed. It is important to note that the decision to proceed with this intervention was based on the information collected from the patient. The Data and conclusions were drawn from the patient's questionnaire, the interview, and the physical examination. Because the information was provided in large part by the patient, it cannot be guaranteed that it has not been purposely or unconsciously manipulated. Every effort has been made to obtain as much relevant data as possible for this evaluation. It is important to note that the conclusions that lead to this procedure are derived in large part from the available data. Always take into account that the treatment will also be dependent on availability of resources and existing treatment  guidelines, considered by other Pain Management Practitioners as being common knowledge and practice, at the time of the intervention. For Medico-Legal purposes, it is also important to point out that variation in procedural techniques and pharmacological choices are the acceptable norm. The indications, contraindications, technique, and results of the above procedure should only be interpreted and judged by a Board-Certified Interventional Pain Specialist with extensive familiarity and expertise in the same exact procedure and technique.

## 2023-06-13 NOTE — Patient Instructions (Addendum)
 Post-Procedure Discharge Instructions  Instructions: Apply ice:  Purpose: This will minimize any swelling and discomfort after procedure.  When: Day of procedure, as soon as you get home. How: Fill a plastic sandwich bag with crushed ice. Cover it with a small towel and apply to injection site. How long: (15 min on, 15 min off) Apply for 15 minutes then remove x 15 minutes.  Repeat sequence on day of procedure, until you go to bed. Apply heat:  Purpose: To treat any soreness and discomfort from the procedure. When: Starting the next day after the procedure. How: Apply heat to procedure site starting the day following the procedure. How long: May continue to repeat daily, until discomfort goes away. Food intake: Start with clear liquids (like water) and advance to regular food, as tolerated.  Physical activities: Keep activities to a minimum for the first 8 hours after the procedure. After that, then as tolerated. Driving: If you have received any sedation, be responsible and do not drive. You are not allowed to drive for 24 hours after having sedation. Blood thinner: (Applies only to those taking blood thinners) You may restart your blood thinner 6 hours after your procedure. Insulin: (Applies only to Diabetic patients taking insulin) As soon as you can eat, you may resume your normal dosing schedule. Infection prevention: Keep procedure site clean and dry. Shower daily and clean area with soap and water. Post-procedure Pain Diary: Extremely important that this be done correctly and accurately. Recorded information will be used to determine the next step in treatment. For the purpose of accuracy, follow these rules: Evaluate only the area treated. Do not report or include pain from an untreated area. For the purpose of this evaluation, ignore all other areas of pain, except for the treated area. After your procedure, avoid taking a long nap and attempting to complete the pain diary after you  wake up. Instead, set your alarm clock to go off every hour, on the hour, for the initial 8 hours after the procedure. Document the duration of the numbing medicine, and the relief you are getting from it. Do not go to sleep and attempt to complete it later. It will not be accurate. If you received sedation, it is likely that you were given a medication that may cause amnesia. Because of this, completing the diary at a later time may cause the information to be inaccurate. This information is needed to plan your care. Follow-up appointment: Keep your post-procedure follow-up evaluation appointment after the procedure (usually 2 weeks for most procedures, 6 weeks for radiofrequencies). DO NOT FORGET to bring you pain diary with you.   Expect: (What should I expect to see with my procedure?) From numbing medicine (AKA: Local Anesthetics): Numbness or decrease in pain. You may also experience some weakness, which if present, could last for the duration of the local anesthetic. Onset: Full effect within 15 minutes of injected. Duration: It will depend on the type of local anesthetic used. On the average, 1 to 8 hours.  From steroids (Applies only if steroids were used): Decrease in swelling or inflammation. Once inflammation is improved, relief of the pain will follow. Onset of benefits: Depends on the amount of swelling present. The more swelling, the longer it will take for the benefits to be seen. In some cases, up to 10 days. Duration: Steroids will stay in the system x 2 weeks. Duration of benefits will depend on multiple posibilities including persistent irritating factors. Side-effects: If present, they may typically  last 2 weeks (the duration of the steroids). Frequent: Cramps (if they occur, drink Gatorade and take over-the-counter Magnesium 450-500 mg once to twice a day); water retention with temporary weight gain; increases in blood sugar; decreased immune system response; increased  appetite. Occasional: Facial flushing (red, warm cheeks); mood swings; menstrual changes. Uncommon: Long-term decrease or suppression of natural hormones; bone thinning. (These are more common with higher doses or more frequent use. This is why we prefer that our patients avoid having any injection therapies in other practices.)  Very Rare: Severe mood changes; psychosis; aseptic necrosis. From procedure: Some discomfort is to be expected once the numbing medicine wears off. This should be minimal if ice and heat are applied as instructed.  Call if: (When should I call?) You experience numbness and weakness that gets worse with time, as opposed to wearing off. New onset bowel or bladder incontinence. (Applies only to procedures done in the spine)  Emergency Numbers: Durning business hours (Monday - Thursday, 8:00 AM - 4:00 PM) (Friday, 9:00 AM - 12:00 Noon): [phone] After hours: [phone] NOTE: If you are having a problem and are unable connect with, or to talk to a provider, then go to your nearest urgent care or emergency department. If the problem is serious and urgent, please call 911.  Moderate Conscious Sedation, Adult, Care After After the procedure, it is common to have: Sleepiness for a few hours. Impaired judgment for a few hours. Trouble with balance. Nausea or vomiting if you eat too soon. Follow these instructions at home: For the time period you were told by your health care provider:  Rest. Do not participate in activities where you could fall or become injured. Do not drive or use machinery. Do not drink alcohol. Do not take sleeping pills or medicines that cause drowsiness. Do not make important decisions or sign legal documents. Do not take care of children on your own. Eating and drinking Follow instructions from your health care provider about what you may eat and drink. Drink enough fluid to keep your urine pale yellow. If you vomit: Drink clear  fluids slowly and in small amounts as you are able. Clear fluids include water, ice chips, low-calorie sports drinks, and fruit juice that has water added to it (diluted fruit juice). Eat light and bland foods in small amounts as you are able. These foods include bananas, applesauce, rice, lean meats, toast, and crackers. General instructions Take over-the-counter and prescription medicines only as told by your health care provider. Have a responsible adult stay with you for the time you are told. Do not use any products that contain nicotine or tobacco. These products include cigarettes, chewing tobacco, and vaping devices, such as e-cigarettes. If you need help quitting, ask your health care provider. Return to your normal activities as told by your health care provider. Ask your health care provider what activities are safe for you. Your health care provider may give you more instructions. Make sure you know what you can and cannot do.   Contact a health care provider if: You are still sleepy or having trouble with balance after 24 hours. You feel light-headed. You vomit every time you eat or drink. You get a rash. You have a fever. You have redness or swelling around the IV site. Get help right away if: You have trouble breathing. You start to feel confused at home. These symptoms may be an emergency. Get help right away. Call 911. Do not wait to see  if the symptoms will go away. Do not drive yourself to the hospital. This information is not intended to replace advice given to you by your health care provider. Make sure you discuss any questions you have with your health care provider. Document Revised: 08/09/2021 Document Reviewed: 08/09/2021 [employer] Patient Education  2024 [employer].

## 2023-06-14 ENCOUNTER — Telehealth: Payer: Self-pay

## 2023-06-14 NOTE — Telephone Encounter (Signed)
 Post procedure follow up.  Patient states she is doing ok.

## 2023-07-04 NOTE — Progress Notes (Unsigned)
 PROVIDER NOTE: Interpretation of information contained herein should be left to medically-trained personnel. Specific patient instructions are provided elsewhere under "Patient Instructions" section of medical record. This document was created in part using AI and STT-dictation technology, any transcriptional errors that may result from this process are unintentional.  Patient: Samantha Cox  Service: E/M   PCP: Eartha Gold, MD  DOB: 12/22/1955  DOS: 07/05/2023  Provider: Candi Chafe, MD  MRN: [id number]  Delivery: Face-to-face  Specialty: Interventional Pain Management  Type: Established Patient  Setting: Ambulatory outpatient facility  Specialty designation: 09  Referring Prov.: Eartha Gold, MD  Location: Outpatient office facility       History of present illness (HPI) [name], a 68 y.o. year old female, is here today because of her Chronic bilateral low back pain without sciatica [M54.50, G89.29]. Ms. Bardon primary complain today is No chief complaint on file.  Pertinent problems: Ms. Martin has Greater trochanteric bursitis of hip (Left); Piriformis syndrome of left side; Morton's neuroma of foot (Left); Leg cramping; Degenerative lumbar spinal stenosis; Greater trochanteric bursitis of hip (Right); Facet arthritis of lumbar region; Chronic SI joint pain (Left); Hamstring tendinitis at origin; Myalgia due to statin; Cervical intraepithelial neoplasia grade 1; Chronic back pain; Degeneration of lumbar intervertebral disc; Enthesopathy of hip region (Right); Metatarsalgia of foot (Left); Pain in foot (Left); Plantar fasciitis of foot (Left); Chronic pain syndrome; Abnormal MRI, lumbar spine (07/28/2019); Cervicalgia (4th area of Pain); Chronic low back pain (1ry area of Pain) (Bilateral) (R>L) w/o sciatica; Lumbar facet syndrome (Bilateral); Lumbosacral facet hypertrophy (Multilevel) (Bilateral); Chronic lower extremity pain (3ry area of Pain)  (nonradicular) (Bilateral) (L>R); Cervicogenic headache (6th area of Pain); Chronic shoulder pain (5th area of Pain) (Bilateral); Chronic hip pain (2ry area of Pain) (Bilateral) (R>L); DDD (degenerative disc disease), lumbar; Osteoarthritis of acromioclavicular joints (Bilateral); Spondylosis of lumbar spine (Multilevel); Lumbar lateral recess stenosis (Left: L2-3, L3-4) (Right: L2-3, L4-5); Lumbar foraminal stenosis (Left: L2-3, L3-4, L4-5) (Right: L3-4, L4-5); Dextroscoliosis of lumbar spine (apex at L3-4); Grade 1 Anterolisthesis of lumbar spine (L4/L5) (5 mm); Osteoarthritis of hips (Bilateral) (L>R); Tendinopathy of gluteus medius (Right); Proximal hamstring tendon low-grade tear, sequela (Right); Superior labral tear (low-grade, partial-thickness) of hip joint, sequela (Right); Spondylosis without myelopathy or radiculopathy, lumbosacral region; Chronic musculoskeletal pain; Other spondylosis, sacral and sacrococcygeal region; Greater trochanteric bursitis (Bilateral); Sacroiliac joint dysfunction (Left); Somatic dysfunction of sacroiliac joint (Left); and Lumbar facet joint pain on their pertinent problem list.  Pain Assessment: Severity of   is reported as a  /10. Location:    / . Onset:  . Quality:  . Timing:  . Modifying factor(s):  Aaron Aas Vitals:  vitals were not taken for this visit.  BMI: Estimated body mass index is 26.96 kg/m as calculated from the following:   Height as of 06/13/23: 5\' 5"  (1.651 m).   Weight as of 06/13/23: 162 lb (73.5 kg).  Last encounter: 05/11/2023. Last procedure: 06/13/2023.  Reason for encounter: post-procedure evaluation and assessment.   Discussed the use of AI scribe software for clinical note transcription with the patient, who gave verbal consent to proceed.  History of Present Illness          Post-procedure evaluation   Type: Lumbar Facet, Medial Branch Block(s)   #4  Laterality: Bilateral  Level: L3, L4, L5, and S1 Medial Branch Level(s). Injecting these  levels blocks the L4-5 and L5-S1 lumbar facet joints.  Imaging: Fluoroscopic guidance Spinal ([id number]) Anesthesia: Local  anesthesia (1-2% Lidocaine ) Anxiolysis: IV Versed          Sedation: Moderate Sedation                       DOS: 06/13/2023 Performed by: Candi Chafe, MD  Primary Purpose: Diagnostic/Therapeutic Indications: Low back pain severe enough to impact quality of life or function. 1. Chronic low back pain (1ry area of Pain) (Bilateral) (R>L) w/o sciatica   2. Facet arthritis of lumbar region   3. Grade 1 Anterolisthesis of lumbar spine (L4/L5) (5 mm)   4. Lumbar facet joint pain   5. Lumbar facet syndrome (Bilateral)   6. Lumbosacral facet hypertrophy (Multilevel) (Bilateral)   7. Spondylosis without myelopathy or radiculopathy, lumbosacral region   8. Abnormal MRI, lumbar spine (07/28/2019)    NAS-11 Pain score:   Pre-procedure: 6 /10   Post-procedure: 0-No pain/10     Effectiveness:  Initial hour after procedure:   ***. Subsequent 4-6 hours post-procedure:   ***. Analgesia past initial 6 hours:   ***. Ongoing improvement:  Analgesic:  *** Function:    ***    ROM:    ***     Pharmacotherapy Assessment   Analgesic: No chronic opioid analgesics therapy prescribed by our practice. None. (05/31/2021) Abnormal UDS (+) Carboxy-THC MME/day: 0 mg/day    Monitoring: [place] PMP: PDMP reviewed during this encounter.       Pharmacotherapy: No side-effects or adverse reactions reported. Compliance: No problems identified. Effectiveness: Clinically acceptable.  No notes on file  CBD:THC Ratio  Date Value Ref Range Status  05/31/2021 Comment RATIO Final    Comment:    INTERFERENCE Unable to complete testing due to unknown interference.  This test measures Cannabidiol  (CBD) and Tetrahydrocannabinol (THC) and metabolites in urine. The CBD:THC ratio is calculated using the sums of the respective metabolites and is intended to assist in differentiating  the presence of Tetrahydrocannabinol [facility]) metabolites due to the use of marijuana or medicinal THC from the presence of THC metabolites due to use of Cannabidiol  (CBD) or hemp products that purportedly contain trace amounts of THC.  CBD:THC Ratio          Interpretation ---------      --------------------------------------------- >=10.0         Consistent with the use of CBD products only 1.0 - 9.9      Indeterminate <1.0           Consistent with marijuana, medicinal THC or                mixed use  Interpretive ranges are provided as guidance and should not be considered definitive. Interpretation of results should include consideration of all relevant clinical and diagnostic information.  Analysis performed by chromatography with mass spectrometry. This test was developed and its performance characteristics determined by [facility].  It has not been cleared or approved by the Food and Drug Administration.    Carboxy-Delta-8-THC  Date Value Ref Range Status  05/31/2021 >500 ng/mL Final   Carboxy-Delta-9-THC  Date Value Ref Range Status  05/31/2021 117.4 ng/mL Final    Comment:    Carboxy-Delta-9-THC is the primary metabolite of Delta-9- Tetrahydrocannabinol. Sources include the prescription medication Dronabinol as well as illicit, recreational, and medical marijuana and marijuana derived products of the same categories.  Carboxy-Delta-8-THC is the primary metabolite of Delta-8- Tetrahydrocannabinol. Sources are products containing Delta- 8-THC, which is primarily chemically manufactured from cannabidiol  (CBD).  Testing Threshold = 2.0 ng/mL  Analysis performed  by Liquid Chromatography with Tandem Mass Spectrometry (LC/MS/MS).  This test was developed and its performance characteristics determined by [facility].  It has not been cleared or approved by the Food and Drug Administration.     UDS:  Summary  Date Value Ref Range Status  05/31/2021 Note  Final     Comment:    ==================================================================== Compliance Drug Analysis, Ur ==================================================================== Test                             Result       Flag       Units  Drug Present and Declared for Prescription Verification   Acetaminophen                   PRESENT      EXPECTED   Ibuprofen                       PRESENT      EXPECTED  Drug Present not Declared for Prescription Verification   Carboxy-THC                    997          UNEXPECTED ng/mg creat    Carboxy-THC is a metabolite of tetrahydrocannabinol (THC). Source of    THC is most commonly herbal marijuana or marijuana-based products,    but THC is also present in a scheduled prescription medication.    Trace amounts of THC can be present in hemp and cannabidiol  (CBD)    products. This test is not intended to distinguish between delta-9-    tetrahydrocannabinol, the predominant form of THC in most herbal or    marijuana-based products, and delta-8-tetrahydrocannabinol.  Drug Absent but Declared for Prescription Verification   Methocarbamol                   Not Detected UNEXPECTED   Salicylate                     Not Detected UNEXPECTED    Aspirin , as indicated in the declared medication list, is not always    detected even when used as directed.  ==================================================================== Test                      Result    Flag   Units      Ref Range   Creatinine              75               mg/dL      >=81 ==================================================================== Declared Medications:  The flagging and interpretation on this report are based on the  following declared medications.  Unexpected results may arise from  inaccuracies in the declared medications.   **Note: The testing scope of this panel includes these medications:   Methocarbamol  (Robaxin )   **Note: The testing scope of this panel does not  include small to  moderate amounts of these reported medications:   Acetaminophen  (Tylenol )  Aspirin   Ibuprofen  (Advil )   **Note: The testing scope of this panel does not include the  following reported medications:   Calcium  (Tums)  Multivitamin  Umeclidinium (Anoro)  Vilanterol (Anoro)  Vitamin D2 (Drisdol ) ==================================================================== For clinical consultation, please call [phone]. ====================================================================      ROS  Constitutional: Denies any fever or chills Gastrointestinal: No reported hemesis, hematochezia, vomiting,  or acute GI distress Musculoskeletal: Denies any acute onset joint swelling, redness, loss of ROM, or weakness Neurological: No reported episodes of acute onset apraxia, aphasia, dysarthria, agnosia, amnesia, paralysis, loss of coordination, or loss of consciousness  Medication Review  Multi-Vitamin, NON FORMULARY, Vitamin D  (Ergocalciferol ), escitalopram, ibuprofen , methocarbamol , and umeclidinium-vilanterol  History Review  Allergy : Ms. Ke is allergic to crestor  [rosuvastatin ], simvastatin , and augmentin [amoxicillin-pot clavulanate]. Drug: Ms. Cullop  reports no history of drug use. Alcohol:  reports current alcohol use of about 1.0 standard drink of alcohol per week. Tobacco:  reports that she has been smoking e-cigarettes. She uses smokeless tobacco. Social: Ms. Ailes  reports that she has been smoking e-cigarettes. She uses smokeless tobacco. She reports current alcohol use of about 1.0 standard drink of alcohol per week. She reports that she does not use drugs. Medical:  has a past medical history of History of tobacco use, Routine general medical examination at a health care facility, and Screening for lipoid disorders. Surgical: Ms. Kalan  has a past surgical history that includes Other surgical history. Family: family history includes Asthma  in her mother; Atrial fibrillation in her father; Bladder Cancer in her father; CVA (age of onset: 51) in her father; Diverticulitis in her brother; Hypertension in her mother; Lung cancer in her brother.  Laboratory Chemistry Profile   Renal Lab Results  Component Value Date   BUN 25 (H) 05/31/2021   CREATININE 0.68 05/31/2021   GFR 84.17 09/20/2016   GFRAA >60 01/16/2019   GFRNONAA >60 05/31/2021    Hepatic Lab Results  Component Value Date   AST 28 05/31/2021   ALT 29 05/31/2021   ALBUMIN 3.8 05/31/2021   ALKPHOS 79 05/31/2021    Electrolytes Lab Results  Component Value Date   NA 139 05/31/2021   K 4.2 05/31/2021   CL 107 05/31/2021   CALCIUM  9.4 05/31/2021   MG 2.3 05/31/2021    Bone Lab Results  Component Value Date   VD25OH 32.18 09/20/2016   25OHVITD1 69 05/31/2021   25OHVITD2 36 05/31/2021   25OHVITD3 33 05/31/2021    Inflammation (CRP: Acute Phase) (ESR: Chronic Phase) Lab Results  Component Value Date   CRP 0.7 05/31/2021   ESRSEDRATE 5 05/31/2021         Note: Above Lab results reviewed.  Recent Imaging Review  DG PAIN CLINIC C-ARM 1-60 MIN NO REPORT Fluoro was used, but no Radiologist interpretation will be provided.  Please refer to "NOTES" tab for provider progress note. Note: Reviewed         Physical Exam  General appearance: Well nourished, well developed, and well hydrated. In no apparent acute distress Mental status: Alert, oriented x 3 (person, place, & time)       Respiratory: No evidence of acute respiratory distress Eyes: PERLA Vitals: There were no vitals taken for this visit. BMI: Estimated body mass index is 26.96 kg/m as calculated from the following:   Height as of 06/13/23: 5\' 5"  (1.651 m).   Weight as of 06/13/23: 162 lb (73.5 kg). Ideal: Patient weight not recorded  Assessment   Diagnosis Status  1. Chronic low back pain (1ry area of Pain) (Bilateral) (R>L) w/o sciatica   2. Lumbar facet joint pain   3. Lumbar facet  syndrome (Bilateral)   4. Chronic lower extremity pain (3ry area of Pain) (nonradicular) (Bilateral) (L>R)   5. Postop check    Controlled Controlled Controlled   Updated Problems: No problems updated.  Plan of Care  Problem-specific:  Assessment and Plan            Ms. AVAMAE DEHAAN has a current medication list which includes the following long-term medication(s): escitalopram, methocarbamol , and methocarbamol .  Pharmacotherapy (Medications Ordered): No orders of the defined types were placed in this encounter.  Orders:  No orders of the defined types were placed in this encounter.    Interventional Therapies  Risk Factors  Considerations:   (05/31/2021) Abnormal UDS (+) Carboxy-THC ([place]IJ[place])  COPD   POOR RFA Candidate. Non-compliant w/ instructions not to move.  The patient is not a good candidate for radiofrequency ablation due to the fact that during the diagnostic injections we learned the patient to be incapable of staying still to assist with the procedure.  Because radiofrequency ablation requires that the patient stay very still for the testing phase of the procedure and the patient is incapable of assisting with this, she is not a candidate for radiofrequency ablation.   Planned  Pending:   Diagnostic bilateral lumbar facet MBB (L3-S1) #4    Under consideration:   Diagnostic/therapeutic bilateral L3 and L4 TFESI #1  Therapeutic bilateral lumbar facet MBB (L4-5, L5-S1) #4  NO RFA - unable to stay still (read above)    Completed:  (Non-compliant: Constant movement)  Diagnostic bilateral lumbar facet MBB (L3-S1) x3 (04/05/2022) (100/100/90/90) (100% LEP relief)  Diagnostic left SI joint Blk x2 (07/19/2022) (100/100/90/90) (100% LEP relief)    Completed by other providers:   (Mobile imaging)  US  (Ultrasound-guided) right greater trochanteric inj. x1 by Dr. Garlan Juniper (02/04/2020)  US  right greater trochanteric bursa inj. x3 by Dr. Ronnell Coins  (11/25/2014; 07/10/2018; 10/19/2018)  US  right SI joint inj. x1 by Dr. Syliva Even (06/05/2019)  US  left gluteus medius insertion greater trochanteric inj. x1 by Dr. Garlan Juniper (01/29/2019)  US  left piriformis tendon sheath inj. x3 by Dr. Ronnell Coins (12/02/2016; 08/23/2017; 02/15/2018)  US  left foot neuroma inj. x1 (2 neuromas between 3rd & 4th, and 4th & 5th toes) by Dr. Ronnell Coins (07/07/2016)  US  right hip joint inj. x1 by Dr. Ronnell Coins (12/04/2014)  Diagnostic bilateral L5 TFESI x1 (03/12/2021) by Candi Chafe, MD (PMR) [facility]  Diagnostic left L5 dorsal ramus and S1, S2, and S3 lateral branch Blk x1, by Genetta Kenning, MD (09/10/2019)  Therapeutic left L5 dorsal ramus and S1, S2, and S3 lateral branch RFA x2, by Genetta Kenning, MD (10/04/2019 & 04/10/2020) (1st provide her with 50% relief of the pain.  2nd made things worse)   Therapeutic  Palliative (PRN) options:   Therapeutic/palliative lumbar facet MBB       No follow-ups on file.    Recent Visits Date Type Provider Dept  06/13/23 Procedure visit Renaldo Caroli, MD [facility]  05/11/23 Office Visit Renaldo Caroli, MD [facility]  04/19/23 Office Visit Renaldo Caroli, MD [facility]  Showing recent visits within past 90 days and meeting all other requirements Future Appointments Date Type Provider Dept  07/05/23 Appointment Renaldo Caroli, MD [facility]  Showing future appointments within next 90 days and meeting all other requirements  I discussed the assessment and treatment plan with the patient. The patient was provided an opportunity to ask questions and all were answered. The patient agreed with the plan and demonstrated an understanding of the instructions.  Patient advised to call back or seek an in-person evaluation if the symptoms or condition worsens.  Duration of encounter: *** minutes.  Total time  on encounter, as per AMA guidelines  included both the face-to-face and non-face-to-face time personally spent by the physician and/or other qualified health care professional(s) on the day of the encounter (includes time in activities that require the physician or other qualified health care professional and does not include time in activities normally performed by clinical staff). Physician's time may include the following activities when performed: Preparing to see the patient (e.g., pre-charting review of records, searching for previously ordered imaging, lab work, and nerve conduction tests) Review of prior analgesic pharmacotherapies. Reviewing PMP Interpreting ordered tests (e.g., lab work, imaging, nerve conduction tests) Performing post-procedure evaluations, including interpretation of diagnostic procedures Obtaining and/or reviewing separately obtained history Performing a medically appropriate examination and/or evaluation Counseling and educating the patient/family/caregiver Ordering medications, tests, or procedures Referring and communicating with other health care professionals (when not separately reported) Documenting clinical information in the electronic or other health record Independently interpreting results (not separately reported) and communicating results to the patient/ family/caregiver Care coordination (not separately reported)  Note by: Candi Chafe, MD (TTS and AI technology used. I apologize for any typographical errors that were not detected and corrected.) Date: 07/05/2023; Time: 4:02 PM

## 2023-07-05 ENCOUNTER — Ambulatory Visit: Attending: Pain Medicine | Admitting: Pain Medicine

## 2023-07-05 ENCOUNTER — Encounter: Payer: Self-pay | Admitting: Pain Medicine

## 2023-07-05 ENCOUNTER — Other Ambulatory Visit: Payer: Self-pay | Admitting: Pulmonary Disease

## 2023-07-05 VITALS — BP 145/89 | HR 64 | Temp 97.0°F | Resp 16 | Ht 65.0 in | Wt 161.0 lb

## 2023-07-05 DIAGNOSIS — M79604 Pain in right leg: Secondary | ICD-10-CM | POA: Diagnosis not present

## 2023-07-05 DIAGNOSIS — M47816 Spondylosis without myelopathy or radiculopathy, lumbar region: Secondary | ICD-10-CM | POA: Diagnosis not present

## 2023-07-05 DIAGNOSIS — M79605 Pain in left leg: Secondary | ICD-10-CM | POA: Diagnosis present

## 2023-07-05 DIAGNOSIS — M545 Low back pain, unspecified: Secondary | ICD-10-CM | POA: Insufficient documentation

## 2023-07-05 DIAGNOSIS — Z87891 Personal history of nicotine dependence: Secondary | ICD-10-CM

## 2023-07-05 DIAGNOSIS — Z09 Encounter for follow-up examination after completed treatment for conditions other than malignant neoplasm: Secondary | ICD-10-CM | POA: Diagnosis present

## 2023-07-05 DIAGNOSIS — M5459 Other low back pain: Secondary | ICD-10-CM | POA: Insufficient documentation

## 2023-07-05 DIAGNOSIS — G8929 Other chronic pain: Secondary | ICD-10-CM | POA: Insufficient documentation

## 2023-07-05 DIAGNOSIS — R911 Solitary pulmonary nodule: Secondary | ICD-10-CM

## 2023-07-05 NOTE — Patient Instructions (Addendum)
 ______________________________________________________________________    TENS (Device can be purchased "online", without prescription. Search: "TENS 7000".) Transcutaneous electrical nerve stimulation (TENS) is a method of pain relief involving the use of a mild electrical current. A TENS machine is a small, battery-operated device that has leads connected to sticky pads called electrodes.   Rechargeable 9V batteries:     Electrode placement:   TENS UNIT SAFETY WARNING SHEET and INFORMATION INDICATIONS AND CONTRAINDICTIONS Read the operation manual before using the device. Freight forwarder ([place] ) restricts this device to sale by or on the order of a physician. Observe your physician's precise instructions and let him show you where to apply the electrodes. For a successful therapy, the correct application of the electrodes is an important factor. Carefully write down the settings your physician recommended. Indications for use This device is a prescription device and only for symptomatic relief of chronic intractable pain. Contraindications:   Any electrode placement that applies current to the carotid sinus (neck) region.   Patients with implanted electronic devices (for example, a pacemaker) or metallic implants should not undertake.   Any electrode placement that causes current to flow transcerebrally (through the head). The use of unit whenever pain symptoms are undiagnosed, unit etiology is determined.   The use of TENS whenever pain syndromes are undiagnosed, until etiology is established.  WARNINGS AND PRECAUTIONS  Warnings:   The device must be kept out of reach of children.   The safety of device for use during pregnancy or delivery has not been established.   Do not place electrodes on front of the throat. This may result in spasms of the laryngeal and pharyngeal muscles.   Do not place the electrodes over the carotid nerve (side of neck below ear).   The device is not effective for pain of  central origin (headaches).   The device may interfere with electronic monitoring equipment (such as ECG monitors and ECG alarms).   Electrodes should not be placed over the eyes, in the mouth, or internally.   These devices have no curative value.   TENS devices should be used only under the continued supervision of a physician.   TENS is a symptomatic treatment and as such suppresses the sensation of pain which would otherwise serve as a protective mechanism. Precautions/Adverse Reactions   Isolated cases of skin irritation may occur at the site of electrode placement following long-term application.   Stimulation should be stopped and electrodes removed until the cause of the irritation can be determined.   Effectiveness is highly dependent upon patient selection by a person qualified in the management of pain patients.   If the device treatment becomes ineffective or unpleasant, stimulation should be discontinued until reevaluation by a physician/clinician.   Always turn the device off before applying or removing electrodes.   Skin irritation and electrode burns are potential adverse reactions.  PURPOSE: A Transcutaneous Electrical Nerve Stimulator, or TENS, unit is designed to relieve post-operative, acute and chronic pain. It is used for pain caused by peripheral nerves and not central. TENS units are prescription-only devices.  OPERATION: TENS units work in a couple of ways. The first way they are thought to work is by a method called the [employer]. The [employer] states that our brains can only handle one stimulus at a time. When you have chronic pain, this pain signal is constantly being sent to your brain and recognized as pain. When an electrical stimulus is added to the area of pain the body feels  this electrical stimulus, and since the brain can only handle one thing at a time, the pain is not transmitted to the brain. The second method thought to be part of TENS unit's success is by way of  stimulating our own bodies to release their own natural painkillers. TENS units do not work for everyone and results may vary. Always follow the instructions and warnings in your user's manual.  USE: One of the most important tasks that must be performed is battery maintenance. If you are using a Engineering geologist, always fully charge it and fully deplete it before charging it again. These batteries can develop memories and by not performing this charging task correctly, your battery's life can be greatly diminished. If your battery does develop a memory you can help expand the memory by charging for 12 - 13 hours and then completely depleting the battery. Always prepare the skin before applying electrodes. Your skin should be clean and free of any lotions or creams. If you are using electrodes that use conductive gel, apply a small, even layer over the electrode. For carbon, self-adhesive electrodes, apply a drop of water to the electrodes before applying to the skin. The electrodes attach to the lead wires and then the TENS unit. Always grasp the connector and not the cord when inserting or removing. When making adjustments, always make sure the unit's channels (1 and 2) are in the OFF position. The actual settings should be recommended and prescribed by your physician. Medical equipment suppliers don'tset or instruct users as to user settings. When you are using the BURST mode, the unit delivers a series of quick pulses followed by a rest. This cycle repeats itself frequently. Always have channels OFF before changing modes.  For MODULATION mode, the stimulation automatically varies the width of the pulse.  For CONVENTIONAL mode, the stimulation is constant. After the settings have been fine-tuned, set the timer to 30 or 60 minutes. Your physician should also prescribe the use time. When the lights become dim, it means your batteries should be replaced or recharged.  ACCESSORIES: The  electrodes and lead wires can be obtained from your medical equipment supplier. Your medical equipment supplier can set up a recurring delivery to accommodate your needs. Electrodes should be replaced once a month and lead wires once every 6 months.  Video Tutorial https://youtu.be/V_quvXRrlQE?[email or web address]  ______________________________________________________________________     ______________________________________________________________________    OTC Supplements:   The following is a list of over-the-counter (OTC) supplements that have been found to have [employer]) studies suggesting that they may be of some benefits when used in moderation in some chronic pain-related conditions.  NOTE:  Always consult with your primary care provider and/or pharmacist before taking any OTC medications to make sure they will not interact with your current medications. Always use manufacturer's recommended dosage.  Supplement Possible benefit May be of benefit in treatment of   Turmeric/curcumin anti-inflammatory Joint and muscle aches and pain.  Glucosamine/chondroitin (triple strength) may slow loss of articular cartilage Joint pain.  Vitamin D -3* may suppress release of chemicals associated with inflammation. Increases tolerance to pain. Joint and muscle aches and pain.   Moringa(+) anti-inflammatory with mild analgesic effects Joint and muscle aches and pain.  Melatonin(+) Helps reset sleep cycle. Insomnia.  Vitamin B-12* may help keep nerves and blood cells healthy as well as maintaining function of nervous system Nerve pain (Burning pain)  Alpha-Lipoic-Acid (ALA)* antioxidant that may help with nerve health, pain, and blocking the  activation of some inflammatory chemicals Diabetic neuropathy and metabolic syndrome  superoxide dismutase (SOD)** Currently being reviewed.   Tiger Balm Currently being reviewed.   hydrolyzed collagen peptides* Currently being reviewed.   Collagen supplementation may increases bone strength, density, and mass; may improve joint stiffness/mobility, and functionality; and may reduce joint pain. Possible chondroprotective effects. May help with protection of joint health.   Methylsulfonylmethane (MSM)* Currently being reviewed.   CBD(+) Currently being reviewed.   Delta-8 THC(+) Currently being reviewed.   *  Generally Recognized As Safe (GRAS) approved substance.-FDA (FindDrives.pl) ** "Possibly Safe", but not considered Generally Recognized As Safe (Not GRAS) by the [place]  Food and Drug Administration (FDA) as a food additive. (+) Not considered Generally Recognized As Safe (Not GRAS) by the [place]  Food and Drug Administration (FDA) as a food additive.  ______________________________________________________________________

## 2023-07-05 NOTE — Progress Notes (Signed)
 Safety precautions to be maintained throughout the outpatient stay will include: orient to surroundings, keep bed in low position, maintain call bell within reach at all times, provide assistance with transfer out of bed and ambulation.

## 2023-07-13 ENCOUNTER — Other Ambulatory Visit: Payer: Self-pay | Admitting: Pulmonary Disease

## 2023-07-13 DIAGNOSIS — Z87891 Personal history of nicotine dependence: Secondary | ICD-10-CM

## 2023-07-13 DIAGNOSIS — R911 Solitary pulmonary nodule: Secondary | ICD-10-CM

## 2023-07-25 ENCOUNTER — Ambulatory Visit
Admission: RE | Admit: 2023-07-25 | Discharge: 2023-07-25 | Disposition: A | Discharge status: Home / Self Care | Source: Ambulatory Visit | Attending: Pulmonary Disease

## 2023-07-25 DIAGNOSIS — R911 Solitary pulmonary nodule: Secondary | ICD-10-CM

## 2023-07-25 DIAGNOSIS — Z87891 Personal history of nicotine dependence: Secondary | ICD-10-CM

## 2024-01-08 ENCOUNTER — Ambulatory Visit

## 2024-01-08 ENCOUNTER — Ambulatory Visit: Admitting: Family Medicine

## 2024-01-08 ENCOUNTER — Other Ambulatory Visit: Payer: Self-pay

## 2024-01-08 VITALS — BP 110/82 | HR 75 | Ht 65.0 in | Wt 163.0 lb

## 2024-01-08 DIAGNOSIS — M25561 Pain in right knee: Secondary | ICD-10-CM

## 2024-01-08 DIAGNOSIS — G8929 Other chronic pain: Secondary | ICD-10-CM

## 2024-01-08 NOTE — Patient Instructions (Signed)
 Thank you for coming in today.   Try riding an exercise bike  Check back as needed  Please get an Xray today before you leave   Please use Voltaren  gel (Generic Diclofenac  Gel) up to 4x daily for pain as needed.  This is available over-the-counter as both the name brand Voltaren  gel and the generic diclofenac  gel.   I've referred you to Physical Therapy.  Let us  know if you don't hear from them in one week.   I recommend you obtained a compression sleeve to help with your joint problems. There are many options on the market however I recommend obtaining a Full Knee Body Helix compression sleeve.  You can find information (including how to appropriate measure yourself for sizing) can be found at www.Body Grandrapidswifi.ch.  Many of these products are health savings account (HSA) eligible.  You can use the compression sleeve at any time throughout the day but is most important to use while being active as well as for 2 hours post-activity.   It is appropriate to ice following activity with the compression sleeve in place.

## 2024-01-08 NOTE — Progress Notes (Signed)
   LILLETTE Ileana Collet, PhD, LAT, ATC acting as a scribe for Artist Lloyd, MD.  Samantha Cox is a 68 y.o. female who presents to [facility] at [facility] today for R knee pain. Pt was previously seen by Dr. Lloyd in 2022 for L hip pain.  Today, pt c/o R knee pain x 3-wks. Pt locates pain to the anterior medial aspect of her R knee  R Knee swelling: slight Mechanical symptoms: yes Aggravates: stairs, prolonged sitting Treatments tried: knee sleeve, IBU  Pertinent review of systems: No fevers or chills  Relevant historical information: Back pain requiring multiple injections. Patient is a museum/gallery curator.  Exam:  BP 110/82   Pulse 75   Ht 5' 5 (1.651 m)   Wt 163 lb (73.9 kg)   SpO2 97%   BMI 27.12 kg/m  General: Well Developed, well nourished, and in no acute distress.   MSK: Right knee minimal effusion normal motion.  Intact strength.    Lab and Radiology Results  X-ray images right knee obtained today personally and independently interpreted. Mild DJD.  No acute fractures. Await formal radiology review   Assessment and Plan: 68 y.o. female with right knee pain due to exacerbation of DJD.  Reviewed quad strengthening exercises compression sleeve and Voltaren  gel.  Physical therapy ordered.  If not improved consider steroid injection.  Check back as needed   PDMP not reviewed this encounter. Orders Placed This Encounter  Procedures   DG Knee AP/LAT W/Sunrise Right    Standing Status:   Future    Number of Occurrences:   1    Expiration Date:   02/08/2024    Reason for Exam (SYMPTOM  OR DIAGNOSIS REQUIRED):   right knee pain    Preferred imaging location?:   [facility]   Ambulatory referral to Physical Therapy    Referral Priority:   Routine    Referral Type:   Physical Medicine    Referral Reason:   Specialty Services Required    Requested Specialty:   Physical Therapy    Number of Visits Requested:   1   No orders of the defined  types were placed in this encounter.    Discussed warning signs or symptoms. Please see discharge instructions. Patient expresses understanding.   The above documentation has been reviewed and is accurate and complete Artist Lloyd, M.D.

## 2024-01-15 ENCOUNTER — Ambulatory Visit: Payer: Self-pay | Admitting: Family Medicine

## 2024-01-15 NOTE — Progress Notes (Signed)
Right knee x-ray shows some arthritis.

## 2024-02-09 ENCOUNTER — Ambulatory Visit: Admitting: Family Medicine

## 2024-02-09 NOTE — Progress Notes (Deleted)
"       ° °  LILLETTE Ileana Collet, PhD, LAT, ATC acting as a scribe for Artist Lloyd, MD.  Samantha Cox is a 69 y.o. female who presents to [facility] at [facility] today for R shoulder pain. Pt was previously seen by Dr. Lloyd on 01/08/24 for R knee pain.  Today, pt c/o R shoulder pain x ***. Pt locates pain to ***  Pt also c/o LBP x ***. Pt locates pain to ***  Radiating pain: LE numbness/tingling: LE weakness: Aggravates: Treatments tried:  Pertinent review of systems: ***  Relevant historical information: ***   Exam:  There were no vitals taken for this visit. General: Well Developed, well nourished, and in no acute distress.   MSK: ***    Lab and Radiology Results No results found for this or any previous visit (from the past 72 hours). No results found.     Assessment and Plan: 69 y.o. female with ***   PDMP not reviewed this encounter. No orders of the defined types were placed in this encounter.  No orders of the defined types were placed in this encounter.    Discussed warning signs or symptoms. Please see discharge instructions. Patient expresses understanding.   ***  "

## 2024-02-19 ENCOUNTER — Ambulatory Visit: Admitting: Family Medicine

## 2024-02-19 ENCOUNTER — Ambulatory Visit

## 2024-02-19 ENCOUNTER — Other Ambulatory Visit: Payer: Self-pay

## 2024-02-19 VITALS — BP 126/84 | HR 70 | Ht 65.0 in | Wt 168.0 lb

## 2024-02-19 DIAGNOSIS — M545 Low back pain, unspecified: Secondary | ICD-10-CM

## 2024-02-19 DIAGNOSIS — G8929 Other chronic pain: Secondary | ICD-10-CM | POA: Diagnosis not present

## 2024-02-19 DIAGNOSIS — M542 Cervicalgia: Secondary | ICD-10-CM

## 2024-02-19 DIAGNOSIS — M25511 Pain in right shoulder: Secondary | ICD-10-CM

## 2024-02-19 DIAGNOSIS — G5702 Lesion of sciatic nerve, left lower limb: Secondary | ICD-10-CM

## 2024-02-19 NOTE — Progress Notes (Signed)
 "        I, Ileana Collet, PhD, LAT, ATC acting as a scribe for Artist Lloyd, MD.  Samantha Cox is a 69 y.o. female who presents to [facility] at [facility] today for R shoulder pain. Pt was previously seen by Dr. Lloyd on 01/08/24 for R knee pain.  Today, pt c/o R shoulder pain that 1st began this summer. A horse lifted her off the ground and then she fell landing on her R shoulder. Pt locates pain to all over her R shoulder and into the R-side of her neck and whole R arm. Pain is a dull pain.  Pt also c/o LBP x a couple weeks. Pt locates pain to L-side of her low back/L buttock  She trains horses professionally.  Radiating pain: yes LE numbness/tingling: yes- posterior aspect of bilat legs LE weakness: no Aggravates: sitting Treatments tried: stretches  Pertinent review of systems: No fevers or chills  Relevant historical information: COPD.  Chronic low back pain.   Exam:  BP 126/84   Pulse 70   Ht 5' 5 (1.651 m)   Wt 168 lb (76.2 kg)   SpO2 94%   BMI 27.96 kg/m  General: Well Developed, well nourished, and in no acute distress.   MSK: C-spine normal-appearing Decreased cervical motion.  Upper extremity strength is intact.  Reflexes are intact. Positive right-sided Spurling's test.  Right shoulder normal.  Normal motion some pain with abduction and functional internal rotation. Strength is intact positive Hawkins and Neer's test.  Left hip: Normal appearing. Tender palpation left lateral hip at greater trochanter.  Hip abduction strength is intact but painful.   Lab and Radiology Results  Procedure: Real-time Ultrasound Guided Injection of left lateral hip greater trochanter bursa at piriformis Device: Philips Affiniti 50G/GE Logiq Images permanently stored and available for review in PACS Verbal informed consent obtained.  Discussed risks and benefits of procedure. Warned about infection, bleeding, hyperglycemia damage to structures among  others. Patient expresses understanding and agreement Time-out conducted.   Noted no overlying erythema, induration, or other signs of local infection.   Skin prepped in a sterile fashion.   Local anesthesia: Topical Ethyl chloride.   With sterile technique and under real time ultrasound guidance: 40 mg of Kenalog  and 2 mL of Marcaine injected into greater trochanter. Fluid seen entering the bursa.   Completed without difficulty   Pain immediately resolved suggesting accurate placement of the medication.   Advised to call if fevers/chills, erythema, induration, drainage, or persistent bleeding.   Images permanently stored and available for review in the ultrasound unit.  Impression: Technically successful ultrasound guided injection.    X-ray images right shoulder and cervical spine obtained today personally and independently interpreted  Cervical spine: The level degenerative changes worse C5-6 C6-7.  No acute fractures.  Right shoulder: No acute fractures.  Mild AC DJD.  Await formal radiology review     Assessment and Plan: 69 y.o. female with chronic right neck and shoulder pain after being pulled by a horse.  Symptoms are somewhat consistent with rotator cuff tendinopathy as well as cervical perispinal pain and possibly even cervical radiculopathy.  Plan for physical therapy for this issue.  If not improved consider MRI for injection planning.  She already does have a relationship with pain management for her back at [facility].  Left lateral hip pain more related to piriformis syndrome or hip abductor tendinopathy and trochanteric bursitis.  Plan for steroid injection today and physical therapy.  PDMP not reviewed this encounter. Orders Placed This Encounter  Procedures   US  LIMITED JOINT SPACE STRUCTURES UP RIGHT(NO LINKED CHARGES)    Reason for Exam (SYMPTOM  OR DIAGNOSIS REQUIRED):   right shoulder pain    Preferred imaging location?:   [facility] Shoulder Right    Standing Status:   Future    Number of Occurrences:   1    Expiration Date:   02/18/2025    Reason for Exam (SYMPTOM  OR DIAGNOSIS REQUIRED):   right shoulder pain    Preferred imaging location?:   [place] [facility]   DG Cervical Spine 2 or 3 views    Standing Status:   Future    Number of Occurrences:   1    Expiration Date:   02/18/2025    Reason for Exam (SYMPTOM  OR DIAGNOSIS REQUIRED):   neck pain    Preferred imaging location?:   [facility]   Ambulatory referral to Physical Therapy    Referral Priority:   Routine    Referral Type:   Physical Medicine    Referral Reason:   Specialty Services Required    Requested Specialty:   Physical Therapy    Number of Visits Requested:   1   No orders of the defined types were placed in this encounter.    Discussed warning signs or symptoms. Please see discharge instructions. Patient expresses understanding.   The above documentation has been reviewed and is accurate and complete Artist Lloyd, M.D.   "

## 2024-02-19 NOTE — Patient Instructions (Addendum)
Thank you for coming in today.  ° °You received an injection today. Seek immediate medical attention if the joint becomes red, extremely painful, or is oozing fluid.  ° °Please get an Xray today before you leave  ° °I've referred you to Physical Therapy.  Let us know if you don't hear from them in one week.  °

## 2024-02-20 ENCOUNTER — Ambulatory Visit: Attending: Nurse Practitioner

## 2024-02-20 ENCOUNTER — Other Ambulatory Visit: Payer: Self-pay

## 2024-02-20 DIAGNOSIS — R293 Abnormal posture: Secondary | ICD-10-CM | POA: Insufficient documentation

## 2024-02-20 DIAGNOSIS — R279 Unspecified lack of coordination: Secondary | ICD-10-CM | POA: Diagnosis present

## 2024-02-20 DIAGNOSIS — M6281 Muscle weakness (generalized): Secondary | ICD-10-CM | POA: Insufficient documentation

## 2024-02-20 NOTE — Patient Instructions (Signed)
" ° °  Urge Drill  When you feel an urge to go, follow these steps to regain control: Stop what you are doing and be still Take one deep breath, directing your air into your abdomen Think an affirming thought, such as I've got this. Do 5 quick flicks of your pelvic floor Walk with control to the bathroom to void, or delay voiding    The knack: Use this technique while coughing, laughing, sneezing, or with any activities that causes you to leak urine a little. Right before you perform one of these activities that increase pressure in the abdomen and pushes a little urine out, perform a pelvic floor muscle contraction and hold. If that does not completely stop the leaking, try tightening your thighs together in addition to performing a pelvic floor muscle contraction. Make sure you are not trying to stifle a cough, sneeze, or laugh; allow these activities in full as it will cause less pressure down into the bladder and pelvic floor muscles.    Double-voiding:  This technique is to help with post-void dribbling, or leaking a little bit when you stand up right after urinating. Use relaxed toileting mechanics to urinate as much as you feel like you have to without straining. Sit back upright from leaning forward and relax this way for 10-20 seconds. Lean forward again to finish voiding any amount more.   [facility] [address] [place], [place] [address] Phone # [phone] Fax [phone]  "

## 2024-02-20 NOTE — Progress Notes (Signed)
 " OUTPATIENT PHYSICAL THERAPY FEMALE PELVIC EVALUATION   Patient Name: Samantha Cox MRN: [id number] DOB:May 10, 1955, 69 y.o., female Today's Date: 02/20/2024  END OF SESSION:  PT End of Session - 02/20/24 1115     Visit Number 1    Date for Recertification  05/14/24    Authorization Type Medicare Part A & B    Progress Note Due on Visit 10    PT Start Time 1114    PT Stop Time 1140    PT Time Calculation (min) 26 min    Activity Tolerance Patient tolerated treatment well    Behavior During Therapy WFL for tasks assessed/performed          Past Medical History:  Diagnosis Date   History of tobacco use    Routine general medical examination at a health care facility    Screening for lipoid disorders    Past Surgical History:  Procedure Laterality Date   OTHER SURGICAL HISTORY     Birthmark removed from forehead 7th grade   Patient Active Problem List   Diagnosis Date Noted   Urge urinary incontinence 04/19/2023   Prolapse of anterior vaginal wall 04/19/2023   Lumbar facet joint pain 10/05/2022   Sacroiliac joint dysfunction (Left) 07/19/2022   Somatic dysfunction of sacroiliac joint (Left) 07/19/2022   Greater trochanteric bursitis (Bilateral) 06/15/2022   Lentigo 02/21/2022   Actinic keratosis 02/21/2022   Seborrheic keratoses 02/21/2022   Sun-damaged skin 02/21/2022   Postoperative nausea 09/21/2021   Other spondylosis, sacral and sacrococcygeal region 07/27/2021   DDD (degenerative disc disease), lumbar 07/06/2021   Osteoarthritis of acromioclavicular joints (Bilateral) 07/06/2021   Spondylosis of lumbar spine (Multilevel) 07/06/2021   Lumbar lateral recess stenosis (Left: L2-3, L3-4) (Right: L2-3, L4-5) 07/06/2021   Lumbar foraminal stenosis (Left: L2-3, L3-4, L4-5) (Right: L3-4, L4-5) 07/06/2021   Dextroscoliosis of lumbar spine (apex at L3-4) 07/06/2021   Grade 1 Anterolisthesis of lumbar spine (L4/L5) (5 mm) 07/06/2021   Osteoarthritis of hips  (Bilateral) (L>R) 07/06/2021   Tendinopathy of gluteus medius (Right) 07/06/2021   Proximal hamstring tendon low-grade tear, sequela (Right) 07/06/2021   Superior labral tear (low-grade, partial-thickness) of hip joint, sequela (Right) 07/06/2021   Spondylosis without myelopathy or radiculopathy, lumbosacral region 07/06/2021   Chronic musculoskeletal pain 07/06/2021   Abnormal MRI, lumbar spine (07/28/2019) 05/31/2021   Cervicalgia (4th area of Pain) 05/31/2021   Chronic low back pain (1ry area of Pain) (Bilateral) (R>L) w/o sciatica 05/31/2021   Lumbar facet syndrome (Bilateral) 05/31/2021   Lumbosacral facet hypertrophy (Multilevel) (Bilateral) 05/31/2021   Chronic lower extremity pain (3ry area of Pain) (nonradicular) (Bilateral) (L>R) 05/31/2021   Cervicogenic headache (6th area of Pain) 05/31/2021   Chronic shoulder pain (5th area of Pain) (Bilateral) 05/31/2021   Use of cannabinoid edibles (CBD) 05/31/2021   Chronic hip pain (2ry area of Pain) (Bilateral) (R>L) 05/31/2021   Allergic rhinitis due to pollen 05/30/2021   Cervical high risk HPV (human papillomavirus) test positive 05/30/2021   Cervical intraepithelial neoplasia grade 1 05/30/2021   Chronic obstructive pulmonary disease, unspecified (HCC) 05/30/2021   Degeneration of lumbar intervertebral disc 05/30/2021   Dyslipidemia 05/30/2021   Hardening of the aorta (main artery of the heart) 05/30/2021   History of cervical dysplasia 05/30/2021   Stenosis of cervix 05/30/2021   Tobacco dependence in remission 05/30/2021   Vitamin D  deficiency 05/30/2021   Chronic pain syndrome 05/30/2021   Pharmacologic therapy 05/30/2021   Disorder of skeletal system 05/30/2021   Problems influencing  health status 05/30/2021   Diverticular disease of colon 09/01/2020   Screening for malignant neoplasm of colon 09/01/2020   Metatarsalgia of foot (Left) 01/10/2020   Pain in foot (Left) 01/10/2020   Plantar fasciitis of foot (Left) 01/10/2020    Myalgia due to statin 12/25/2019   Chronic SI joint pain (Left) 07/31/2019   Hamstring tendinitis at origin 07/31/2019   Facet arthritis of lumbar region 03/05/2019   Disequilibrium 02/18/2019   Ex-cigarette smoker 12/19/2018   Greater trochanteric bursitis of hip (Right) 07/10/2018   Chronic back pain 09/06/2017   Bilateral hearing loss 01/06/2017   BPPV (benign paroxysmal positional vertigo), unspecified laterality 01/06/2017   Tinnitus, bilateral 01/06/2017   Degenerative lumbar spinal stenosis 10/17/2016   Leg cramping 09/19/2016   Morton's neuroma of foot (Left) 05/24/2016   Piriformis syndrome of left side 05/13/2015   Greater trochanteric bursitis of hip (Left) 11/25/2014   Enthesopathy of hip region (Right) 11/25/2014    PCP: Epifanio Alm SQUIBB, MD   REFERRING PROVIDER: Kennard Lorane PARAS, FNP   REFERRING DIAG: N39.41 (ICD-10-CM) - Urge incontinence  THERAPY DIAG:  Muscle weakness (generalized)  Unspecified lack of coordination  Abnormal posture  Rationale for Evaluation and Treatment: Rehabilitation  ONSET DATE: couple of years  SUBJECTIVE:                                                                                                                                                                                           SUBJECTIVE STATEMENT: Pt states that she's having issues with sudden urgency. It seems to go through phases. She'll stand up to go to the bathroom and urine will just start coming out.    PAIN:  Are you having pain? No   PRECAUTIONS: None  RED FLAGS: None   WEIGHT BEARING RESTRICTIONS: No  FALLS:  Has patient fallen in last 6 months? 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   ACTIVITY LEVEL : PT exercises, riding, training   PLOF: Independent  PATIENT GOALS: improve bladder control   PERTINENT HISTORY:  Degenerative disc disease Sexual abuse: No  BOWEL MOVEMENT: WNL  URINATION: Pain with  urination: No Fully empty bladder: No Stream: variable - sometimes starts without issues, other times it takes effort, sometimes dribbles Urgency: Yes  Frequency: variable Nocturia: 1x Fluid Intake: variable based on work - changes symptoms; feels like she does not drink enough water  Leakage: Urge to void, Walking to the bathroom, Coughing, Sneezing, Laughing, and Exercise Pads: No  INTERCOURSE:  Not sexually active  PREGNANCY: Vaginal deliveries 2 Tearing No Episiotomy No C-section deliveries 0 Currently pregnant No  PROLAPSE: None   OBJECTIVE:  Note: Objective measures were completed at Evaluation unless otherwise noted.  02/20/2024 PATIENT SURVEYS:   UIQ-7: 57  COGNITION: Overall cognitive status: Within functional limits for tasks assessed     SENSATION: Light touch: Appears intact   FUNCTIONAL TESTS:  Squat: Rt hip juts out, Lt valgus knee collapse  Single leg stance:  Rt: pelvic drop  Lt: pelvic drop Curl-up test: abdominal distortion along midline   GAIT: Assistive device utilized: None Comments: WNL  POSTURE: rounded shoulders, forward head, decreased lumbar lordosis, increased thoracic kyphosis, posterior pelvic tilt, and Rt posterior rotation   LUMBARAROM/PROM:  A/PROM A/PROM  Eval (% available)  Flexion 50  Extension 50  Right lateral flexion 50  Left lateral flexion 50  Right rotation 50  Left rotation 50   (Blank rows = not tested)  PALPATION: General: increased tension in lumbar paraspinals  Abdominal: Sternocostal angle: slightly increased  Breathing: WNL Tenderness: mild Lt lower quadrant tenderness  Scar tissue: NA Diastasis: 2 finger width                External Perineal Exam: dryness; looks to be old perineal scar tissue even though patient denies tearing during deliveries                              Internal Pelvic Floor: low tone  Patient confirms identification and approves PT to assess internal pelvic floor and  treatment Yes  PELVIC MMT:   MMT eval  Vaginal 3/5, 8 second hold  (Blank rows = not tested)        TONE: low  PROLAPSE: Grade 1 anterior/Grade 2 posterior vaginal wall laxity  TODAY'S TREATMENT:                                                                                                                              DATE:  02/20/2024 EVAL  Neuromuscular re-education: Pt provides verbal consent for internal vaginal/rectal pelvic floor exam. Internal vaginal pelvic floor muscle contraction training Quick flicks Long holds Urge drill Exercises:  Therapeutic activities: The knack Double voiding    PATIENT EDUCATION:  Education details: See above Person educated: Patient Education method: Explanation, Demonstration, Tactile cues, Verbal cues, and Handouts Education comprehension: verbalized understanding  HOME EXERCISE PROGRAM: [email or web address]  ASSESSMENT:  CLINICAL IMPRESSION: Patient is a 69 y.o. female who was seen today for physical therapy evaluation and treatment for urge and stress urinary incontinence that is impacting work and daily life. Exam findings notable for abnormal posture, significant decrease in lumbar A/ROM, pelvic drop in single leg stance, core weakness, Rt lower quadrant tenderness, pelvic floor muscle weakness, anterior/posterior vaginal wall laxity, and decreased pelvic floor muscle coordination. Signs and symptoms are most consistent with vaginal wall laxity and pelvic floor muscle weakness; findings are mild and believe she will be able to quickly improve control with coordination and strengthening exercises while  focusing on pressure management. She will continue to benefit from skilled PT intervention in order to decrease urinary incontinence, address impairments, and improve functional ability.   OBJECTIVE IMPAIRMENTS: decreased activity tolerance, decreased coordination, decreased endurance, decreased mobility, decreased ROM, decreased strength,  increased fascial restrictions, increased muscle spasms, impaired flexibility, impaired tone, improper body mechanics, postural dysfunction, and pain.   ACTIVITY LIMITATIONS: continence  PARTICIPATION LIMITATIONS: community activity, occupation, and exercise  PERSONAL FACTORS: 1 comorbidity: medical history are also affecting patient's functional outcome.   REHAB POTENTIAL: Good  CLINICAL DECISION MAKING: Stable/uncomplicated  EVALUATION COMPLEXITY: Low   GOALS: Goals reviewed with patient? Yes  SHORT TERM GOALS: Target date: 03/19/2024   Pt will be independent with HEP in order to improve activity tolerance.   Baseline: Goal status: INITIAL  2.  Pt will be independent with the knack, urge suppression technique, and double voiding in order to improve bladder habits and decrease urinary incontinence.   Baseline:  Goal status: INITIAL  3.  Patient will report 25% improvement in urinary incontinence in order to decrease pad use, risk of infection, and quality of life.   Baseline:  Goal status: INITIAL  4.  Pt will be able to correctly perform diaphragmatic breathing and appropriate pressure management in order to prevent worsening vaginal wall laxity and improve pelvic floor A/ROM.   Baseline:  Goal status: INITIAL   LONG TERM GOALS: Target date: 05/14/2024  Pt will be independent with advanced HEP in order to improve activity tolerance.   Baseline:  Goal status: INITIAL  2.  Pt will demonstrate normal pelvic floor muscle tone and A/ROM, able to achieve 4/5 strength with contractions and 10 sec endurance, in order to reduce urinary leaking and number of pads patient wears.   Baseline:  Goal status: INITIAL  3.  Patient will report 75% improvement in urinary incontinence in order to decrease pad use, decrease risk of infection, and improve quality of life.   Baseline:  Goal status: INITIAL  4.  Patient will be able to perform single leg stance with good pelvic  stability in order to demonstrate appropriate pelvic floor muscle and transversus abdominus strength and coordination in order to decrease urinary incontinence.   Baseline:  Goal status: INITIAL  5.  Pt will be able to go 2-3 hours in between voids without urgency or incontinence in order to improve QOL and perform all functional activities with less difficulty.   Baseline:  Goal status: INITIAL    PLAN:  PT FREQUENCY: 1-2x/week  PT DURATION: 8 visits   PLANNED INTERVENTIONS: [id number]- PT Re-evaluation, 97110-Therapeutic exercises, 97530- Therapeutic activity, 97112- Neuromuscular re-education, 97535- Self Care, [id number]- Manual therapy, [phone]- Gait training, [phone]- Aquatic Therapy, [phone]- Electrical stimulation (unattended), [phone]- Traction (mechanical), [id number]- Ionotophoresis 4mg /ml Dexamethasone , 79439 (1-2 muscles), 20561 (3+ muscles)- Dry Needling, Patient/Family education, Balance training, Taping, Joint mobilization, Joint manipulation, Spinal manipulation, Spinal mobilization, Scar mobilization, Vestibular training, Cryotherapy, Moist heat, and Biofeedback  PLAN FOR NEXT SESSION: voiding schedule, core training, pressure management, inverted lying position  Josette Mares, PT, DPT1/13/20261:51 PM [facility] [address], [address] [place], [place] [address] Phone # [phone] Fax [phone]   "

## 2024-02-22 ENCOUNTER — Telehealth: Payer: Self-pay | Admitting: Family Medicine

## 2024-02-22 NOTE — Telephone Encounter (Signed)
 Patient would like [facility] sent to [facility] on [email or web address]

## 2024-02-22 NOTE — Telephone Encounter (Signed)
 Patient was here on Monday and said that a PT referral for her shoulders and neck was being sent to [facility]. They have not received it. Confirming this was sent.

## 2024-02-22 NOTE — Telephone Encounter (Signed)
 done

## 2024-02-26 ENCOUNTER — Ambulatory Visit: Payer: Self-pay | Admitting: Family Medicine

## 2024-02-26 NOTE — Progress Notes (Signed)
Cervical spine x-ray shows multilevel arthritis.

## 2024-02-26 NOTE — Progress Notes (Signed)
Right shoulder x-ray shows mild arthritis

## 2024-03-28 ENCOUNTER — Ambulatory Visit

## 2024-04-04 ENCOUNTER — Ambulatory Visit

## 2024-04-11 ENCOUNTER — Ambulatory Visit

## 2024-04-18 ENCOUNTER — Ambulatory Visit
